# Patient Record
Sex: Female | Born: 1935 | Race: White | Hispanic: No | Smoking: Never smoker
Health system: Southern US, Community
[De-identification: ages and names within clinical notes are randomized; demographics above are authoritative.]

## PROBLEM LIST (undated history)

## (undated) ENCOUNTER — Emergency Department (HOSPITAL_COMMUNITY): Payer: Medicare PPO

## (undated) DIAGNOSIS — I10 Essential (primary) hypertension: Secondary | ICD-10-CM

---

## 2011-08-27 DIAGNOSIS — H401190 Primary open-angle glaucoma, unspecified eye, stage unspecified: Secondary | ICD-10-CM | POA: Insufficient documentation

## 2013-12-17 DIAGNOSIS — E785 Hyperlipidemia, unspecified: Secondary | ICD-10-CM | POA: Insufficient documentation

## 2013-12-17 DIAGNOSIS — I119 Hypertensive heart disease without heart failure: Secondary | ICD-10-CM | POA: Insufficient documentation

## 2020-01-01 ENCOUNTER — Encounter (HOSPITAL_BASED_OUTPATIENT_CLINIC_OR_DEPARTMENT_OTHER): Payer: Self-pay

## 2020-01-01 ENCOUNTER — Other Ambulatory Visit: Payer: Self-pay

## 2020-01-01 ENCOUNTER — Encounter: Payer: Self-pay | Admitting: Family Medicine

## 2020-01-01 ENCOUNTER — Ambulatory Visit (HOSPITAL_BASED_OUTPATIENT_CLINIC_OR_DEPARTMENT_OTHER)
Admission: RE | Admit: 2020-01-01 | Discharge: 2020-01-01 | Disposition: A | Discharge status: Home / Self Care | Payer: Medicare PPO | Source: Ambulatory Visit | Attending: Family Medicine | Admitting: Family Medicine

## 2020-01-01 ENCOUNTER — Ambulatory Visit: Payer: Medicare PPO | Admitting: Family Medicine

## 2020-01-01 VITALS — BP 130/78 | HR 76 | Temp 98.6°F | Resp 16 | Ht 66.0 in | Wt 134.4 lb

## 2020-01-01 DIAGNOSIS — R531 Weakness: Secondary | ICD-10-CM | POA: Diagnosis not present

## 2020-01-01 DIAGNOSIS — R10817 Generalized abdominal tenderness: Secondary | ICD-10-CM

## 2020-01-01 DIAGNOSIS — R5383 Other fatigue: Secondary | ICD-10-CM

## 2020-01-01 DIAGNOSIS — N3000 Acute cystitis without hematuria: Secondary | ICD-10-CM

## 2020-01-01 DIAGNOSIS — R109 Unspecified abdominal pain: Secondary | ICD-10-CM

## 2020-01-01 DIAGNOSIS — R634 Abnormal weight loss: Secondary | ICD-10-CM | POA: Insufficient documentation

## 2020-01-01 DIAGNOSIS — R202 Paresthesia of skin: Secondary | ICD-10-CM

## 2020-01-01 DIAGNOSIS — R630 Anorexia: Secondary | ICD-10-CM

## 2020-01-01 DIAGNOSIS — R6881 Early satiety: Secondary | ICD-10-CM | POA: Diagnosis present

## 2020-01-01 DIAGNOSIS — H401120 Primary open-angle glaucoma, left eye, stage unspecified: Secondary | ICD-10-CM

## 2020-01-01 DIAGNOSIS — R300 Dysuria: Secondary | ICD-10-CM

## 2020-01-01 DIAGNOSIS — M810 Age-related osteoporosis without current pathological fracture: Secondary | ICD-10-CM

## 2020-01-01 DIAGNOSIS — G4700 Insomnia, unspecified: Secondary | ICD-10-CM

## 2020-01-01 LAB — POCT URINALYSIS DIP (PROADVANTAGE DEVICE)
Bilirubin, UA: NEGATIVE
Glucose, UA: NEGATIVE mg/dL
Ketones, POC UA: NEGATIVE mg/dL
Nitrite, UA: POSITIVE — AB
Protein Ur, POC: NEGATIVE mg/dL
Specific Gravity, Urine: 1.025
Urobilinogen, Ur: NEGATIVE
pH, UA: 5.5 (ref 5.0–8.0)

## 2020-01-01 LAB — BASIC METABOLIC PANEL
BUN/Creatinine Ratio: 30 — ABNORMAL HIGH (ref 12–28)
BUN: 21 mg/dL (ref 8–27)
CO2: 25 mmol/L (ref 20–29)
Calcium: 9.8 mg/dL (ref 8.7–10.3)
Chloride: 104 mmol/L (ref 96–106)
Creatinine, Ser: 0.71 mg/dL (ref 0.57–1.00)
GFR calc Af Amer: 92 mL/min/{1.73_m2} (ref >59)
GFR calc non Af Amer: 80 mL/min/{1.73_m2} (ref >59)
Glucose: 104 mg/dL — ABNORMAL HIGH (ref 65–99)
Potassium: 4.7 mmol/L (ref 3.5–5.2)
Sodium: 138 mmol/L (ref 134–144)

## 2020-01-01 IMAGING — CT CT ABD-PELV W/ CM
2 of 5 series · 14 of 46 positions shown, 16 images · IV contrast (Omnipaque)
Comparison: None.
COMPARISON: None.

Addendum:
CLINICAL DATA: Abdominal pain, unintended weight loss.

EXAM:
CT ABDOMEN AND PELVIS WITH CONTRAST
TECHNIQUE: Multidetector CT imaging of the abdomen and pelvis was performed
using the standard protocol following bolus administration of
intravenous contrast.
CONTRAST:  100mL OMNIPAQUE IOHEXOL 300 MG/ML  SOLN

[Series 2: axial st · axial · 0.84mm/px · z∈[-429,-44]mm · 11 of 87 slices shown, 13 images]
[im 5/87  soft-tissue]
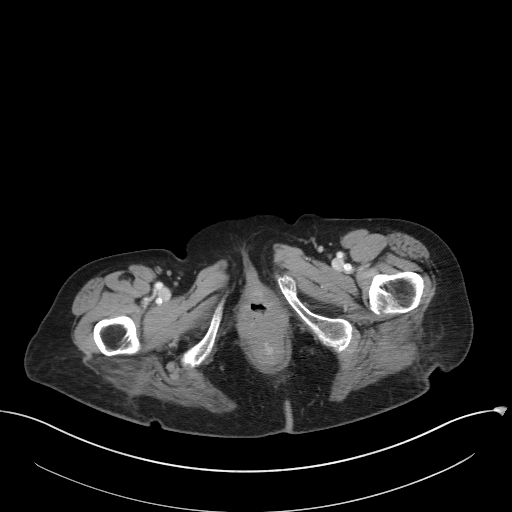
[im 5/87  bone]
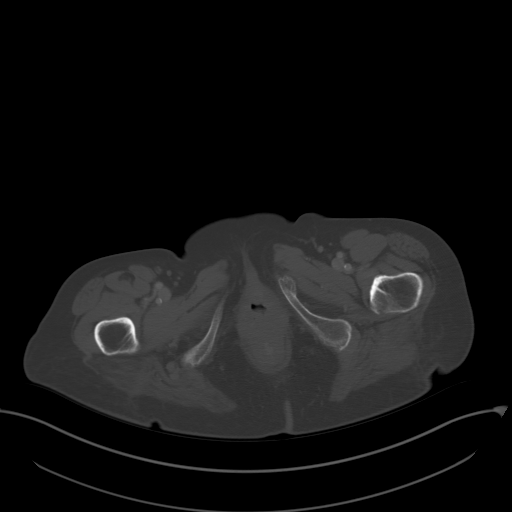
[im 15/87  soft-tissue]
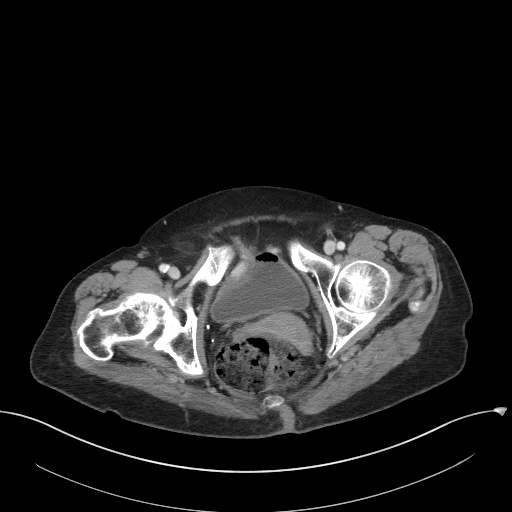
[im 20/87  soft-tissue]
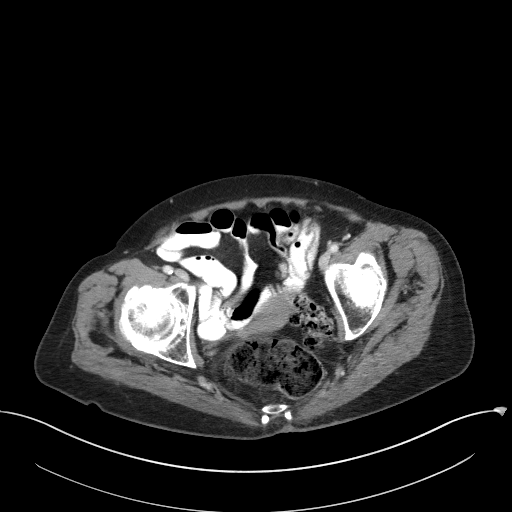
[im 29/87  soft-tissue]
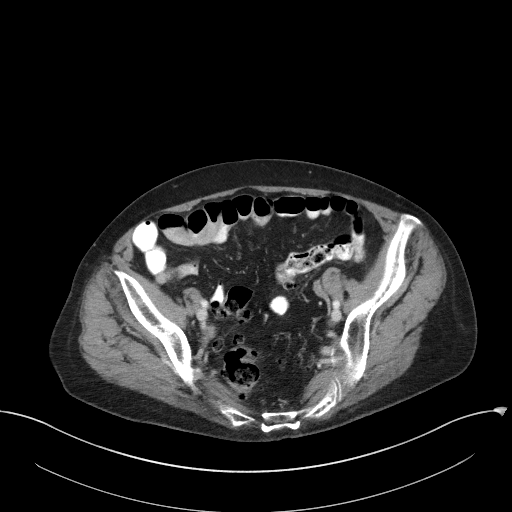
[im 34/87  soft-tissue]
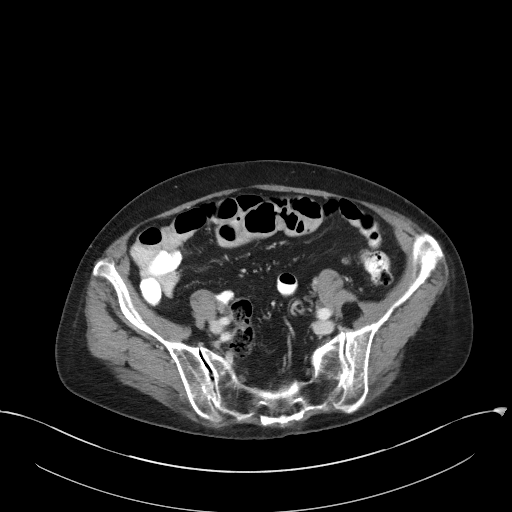
[im 44/87  soft-tissue]
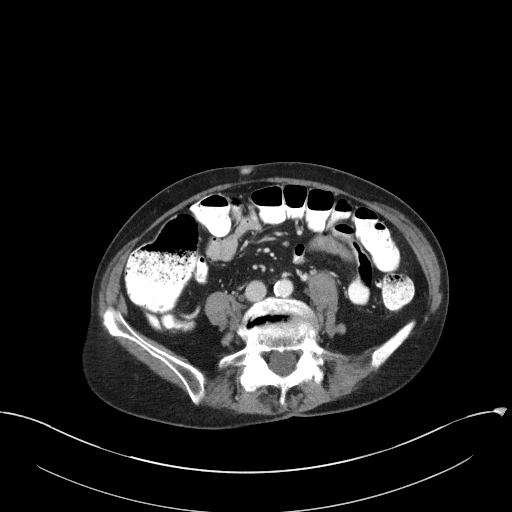
[im 53/87  soft-tissue]
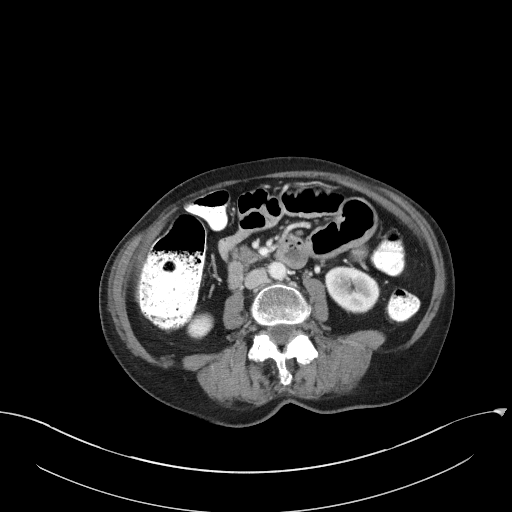
[im 58/87  soft-tissue]
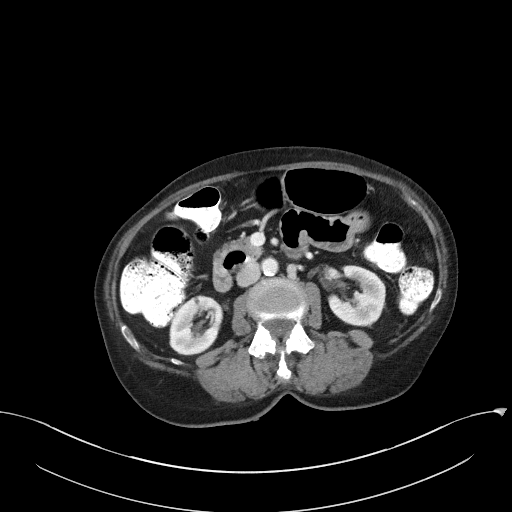
[im 67/87  soft-tissue]
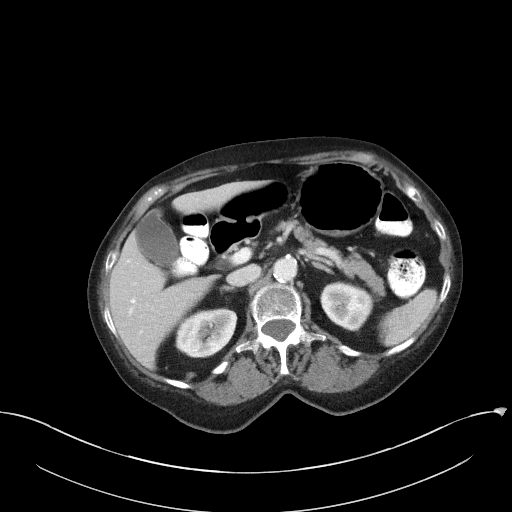
[im 67/87  bone]
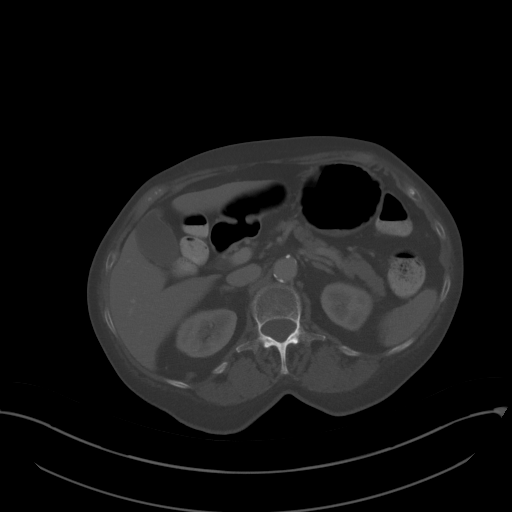
[im 72/87  soft-tissue]
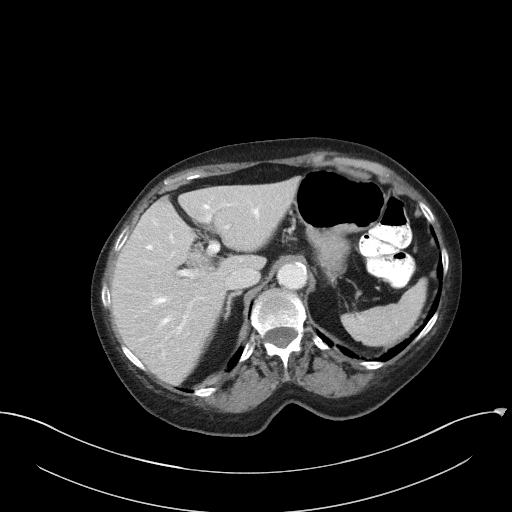
[im 82/87  soft-tissue]
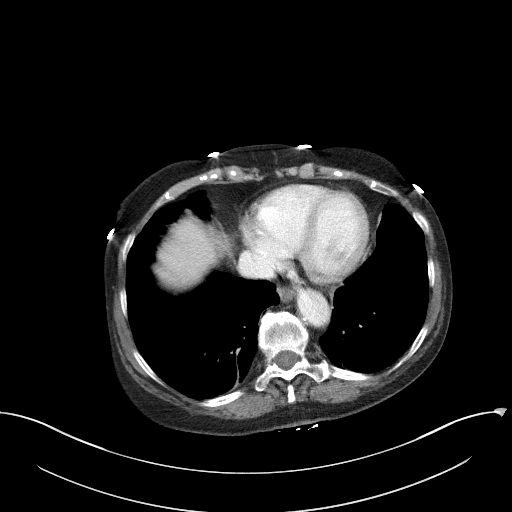

[Series 5: coronal st · coronal · 0.75mm/px · 3 of 76 slices shown]
[im 26/76  soft-tissue]
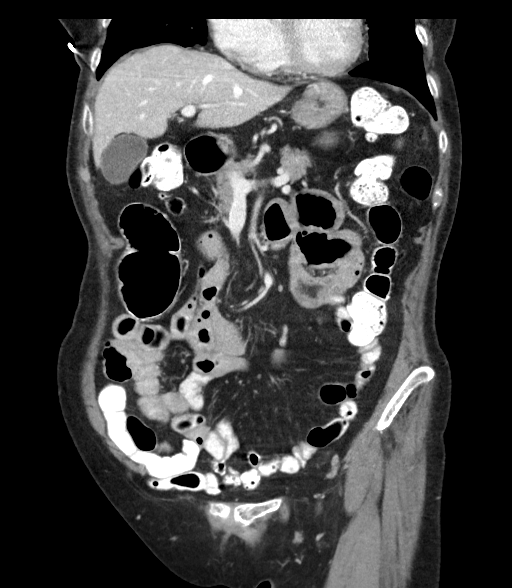
[im 34/76  soft-tissue]
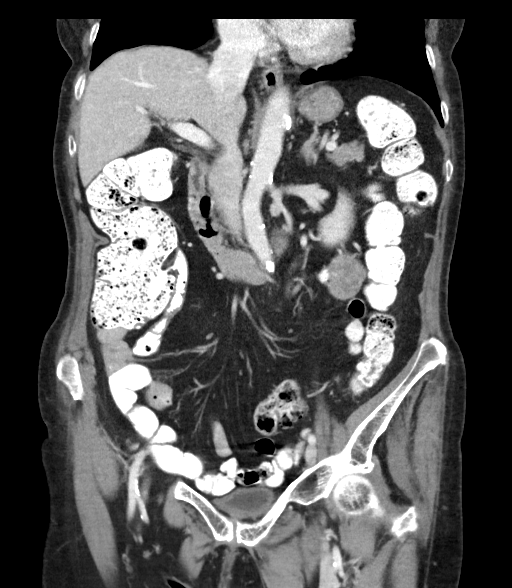
[im 42/76  soft-tissue]
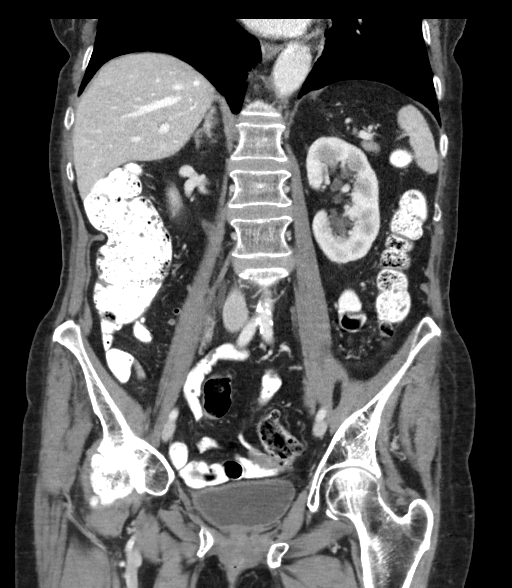

[14 of 46 positions shown; findings below may reference images not displayed]

FINDINGS: Lower chest: Mild bibasilar scarring/atelectasis.

Hepatobiliary: No focal liver abnormality is seen. No gallstones,
gallbladder wall thickening, or biliary dilatation.

Pancreas: Unremarkable. No pancreatic ductal dilatation or
surrounding inflammatory changes.

Spleen: Normal in size without focal abnormality.

Adrenals/Urinary Tract: Adrenal glands are unremarkable. Kidneys are
normal, without renal calculi, focal lesion, or hydronephrosis.
Bladder is unremarkable. Air within the bladder is presumably
related to recent catheterization.

Stomach/Bowel: No dilated large or small bowel loops. Diverticulosis
of the descending and sigmoid colon, most pronounced within the
sigmoid colon, but no focal inflammatory change is seen to suggest
acute diverticulitis. No evidence of bowel wall inflammation.
Stomach is unremarkable. Mild thickening of the walls of the small
bowel in the LEFT upper quadrant, of uncertain significance or
chronicity. Appendix is normal. Fairly large amount of stool in the
rectal vault.

Vascular/Lymphatic: Aortic atherosclerosis. No acute appearing
vascular abnormality. No enlarged lymph nodes are seen in the
abdomen or pelvis.

Reproductive: Uterus and bilateral adnexa are unremarkable.

Other: No free fluid or abscess collection.  No soft tissue mass.

Musculoskeletal: No acute or suspicious osseous abnormality.
Degenerative spondylosis of the lower lumbar spine, mild to moderate
in degree.
IMPRESSION: 1. Mild thickening of the walls of the small bowel in the LEFT upper
quadrant, of uncertain significance or chronicity. This could
represent a mild enteritis of infectious or inflammatory nature.
2. Colonic diverticulosis without evidence of acute diverticulitis.
3. Fairly large amount of stool in the rectal vault (constipation?).
4. Stomach appears normal.

Aortic Atherosclerosis ([VY]-[VY]).

ADDENDUM:
Ordering physician tells me that there was no recent bladder
catheterization prior to this CT exam to explain the air within the
bladder.

After further review, there is no evidence of a bowel fistula (no
continuity of the bladder walls and overlying bowel loops) or other
potential source for the air that is seen within the bladder. Close
clinical follow-up recommended to exclude early developing
gas-forming cystitis.

These findings and considerations were discussed with the ordering
physician, nurse practitioner PARTAIN, on [DATE] at [DATE] p.m.

*** End of Addendum ***
FINDINGS: Lower chest: Mild bibasilar scarring/atelectasis.

Hepatobiliary: No focal liver abnormality is seen. No gallstones,
gallbladder wall thickening, or biliary dilatation.

Pancreas: Unremarkable. No pancreatic ductal dilatation or
surrounding inflammatory changes.

Spleen: Normal in size without focal abnormality.

Adrenals/Urinary Tract: Adrenal glands are unremarkable. Kidneys are
normal, without renal calculi, focal lesion, or hydronephrosis.
Bladder is unremarkable. Air within the bladder is presumably
related to recent catheterization.

Stomach/Bowel: No dilated large or small bowel loops. Diverticulosis
of the descending and sigmoid colon, most pronounced within the
sigmoid colon, but no focal inflammatory change is seen to suggest
acute diverticulitis. No evidence of bowel wall inflammation.
Stomach is unremarkable. Mild thickening of the walls of the small
bowel in the LEFT upper quadrant, of uncertain significance or
chronicity. Appendix is normal. Fairly large amount of stool in the
rectal vault.

Vascular/Lymphatic: Aortic atherosclerosis. No acute appearing
vascular abnormality. No enlarged lymph nodes are seen in the
abdomen or pelvis.

Reproductive: Uterus and bilateral adnexa are unremarkable.

Other: No free fluid or abscess collection.  No soft tissue mass.

Musculoskeletal: No acute or suspicious osseous abnormality.
Degenerative spondylosis of the lower lumbar spine, mild to moderate
in degree.
IMPRESSION: 1. Mild thickening of the walls of the small bowel in the LEFT upper
quadrant, of uncertain significance or chronicity. This could
represent a mild enteritis of infectious or inflammatory nature.
2. Colonic diverticulosis without evidence of acute diverticulitis.
3. Fairly large amount of stool in the rectal vault (constipation?).
4. Stomach appears normal.

Aortic Atherosclerosis ([VY]-[VY]).

## 2020-01-01 MED ORDER — IOHEXOL 300 MG/ML  SOLN
100.0000 mL | Freq: Once | INTRAMUSCULAR | Status: AC | PRN
  Administered 2020-01-01: 16:00:00 100 mL via INTRAVENOUS

## 2020-01-01 MED ORDER — SULFAMETHOXAZOLE-TRIMETHOPRIM 800-160 MG PO TABS: 1.0000 | ORAL_TABLET | Freq: Two times a day (BID) | ORAL | 0 refills | Status: DC

## 2020-01-01 NOTE — Patient Instructions (Signed)
Increase your fluids. Your urine needs a be a very light yellow and not dark like tea.   I sent in an antibiotic prescription to the pharmacy below.   [facility] [address] [place]   I will forward all of today's results to your PCP in [place].

## 2020-01-01 NOTE — Progress Notes (Signed)
Please assist me in forwarding these results (along with other results) to her PCP at [facility], Dr. Dalphine Handing at [facility].

## 2020-01-01 NOTE — Progress Notes (Signed)
Subjective:    Patient ID: Sheila Andrade, female    DOB: 30-Nov-1935, 84 y.o.   MRN: [id number]  HPI Chief Complaint  Patient presents with  . Abdominal Pain    And feeling full when she eats two bites. Also insomnia.    She is a 84 year old Caucasian female who is new to the practice and here for an acute complaint. She lives in [place] and has a primary care at [facility] internal medicine but is in town visiting with her daughter over the holidays.  Complains of a 2-week history of severe fatigue, generalized weakness, abdominal bloating, early satiety, increased eructation as well as a 5 pound weight loss over the past week or 2.  Reports having a poor appetite. In the past she has been quite energetic  Negative Covid test yesterday.   She also reports intermittent acid reflux.  Slight worsening constipation  Denies fever, chills, headaches, dizziness, chest pain, palpitations, shortness of breath, vomiting or diarrhea.  Denies any blood in her stool.  She also has a several week history of waking up early and inability to sleep through the night as she has in the past. States she is waking up at 4 AM and cannot fall back to sleep.  She has tried Tylenol PM and melatonin 3 mg.  She is also noticed "burning" of her skin on the top of her hands.  This occurs in the mornings.  No history of smoking, alcohol or drug use.  Her last colonoscopy was approximately 8-10 years ago.  She has noticed pain with urination and urinary frequency over the past 2 to 3 days.  Reviewed allergies, medications, past medical, surgical, family, and social history.    Review of Systems Pertinent positives and negatives in the history of present illness.     Objective:   Physical Exam Constitutional:      General: She is not in acute distress.    Appearance: She is well-developed. She is not ill-appearing.  HENT:     Mouth/Throat:     Mouth: Mucous membranes are moist.  Eyes:      Extraocular Movements: Extraocular movements intact.     Pupils: Pupils are equal, round, and reactive to light.  Neck:     Thyroid: No thyromegaly or thyroid tenderness.  Cardiovascular:     Rate and Rhythm: Normal rate and regular rhythm.     Pulses: Normal pulses.     Heart sounds: Murmur heard.  No friction rub. No gallop.      Comments: Mild murmur  Pulmonary:     Effort: Pulmonary effort is normal.     Breath sounds: Normal breath sounds.  Abdominal:     General: Abdomen is flat. Bowel sounds are increased. There is no distension or abdominal bruit.     Palpations: Abdomen is soft.     Tenderness: There is generalized abdominal tenderness. There is no right CVA tenderness, left CVA tenderness, guarding or rebound. Negative signs include Murphy's sign, Rovsing's sign and McBurney's sign.  Musculoskeletal:     Cervical back: Full passive range of motion without pain and neck supple.     Right lower leg: No edema.     Left lower leg: No edema.  Lymphadenopathy:     Cervical: No cervical adenopathy.     Right cervical: No superficial cervical adenopathy.    Left cervical: No superficial cervical adenopathy.  Skin:    General: Skin is warm and dry.     Capillary  Refill: Capillary refill takes less than 2 seconds.     Coloration: Skin is not pale.  Neurological:     General: No focal deficit present.     Mental Status: She is alert and oriented to person, place, and time.     Cranial Nerves: Cranial nerves are intact.     Sensory: Sensation is intact.     Motor: Motor function is intact.  Psychiatric:        Attention and Perception: Attention normal.        Mood and Affect: Mood and affect normal.        Speech: Speech normal.        Behavior: Behavior normal.        Cognition and Memory: Cognition and memory normal.    BP 130/78   Pulse 76   Temp 98.6 F (37 C)   Resp 16   Ht 5\' 6"  (1.676 m)   Wt 134 lb 6.4 oz (61 kg)   BMI 21.69 kg/m       Assessment & Plan:   Abdominal pain, unspecified abdominal location - Plan: POCT Urinalysis DIP (Proadvantage Device), CBC with Differential/Platelet, Comprehensive metabolic panel, Lipase, CT Abdomen Pelvis W Contrast, Basic metabolic panel -fairly benign abdominal exam although diffuse TTP   Early satiety - Plan: CBC with Differential/Platelet, Comprehensive metabolic panel, Lipase, CT Abdomen Pelvis W Contrast -check labs, CT abdomen  Generalized weakness - Plan: CBC with Differential/Platelet, Comprehensive metabolic panel, Vitamin B12, VITAMIN D 25 Hydroxy (Vit-D Deficiency, Fractures), Sedimentation rate, Iron, TIBC and Ferritin Panel -check labs   Fatigue, unspecified type - Plan: TSH, T4, free, T3, Vitamin B12, VITAMIN D 25 Hydroxy (Vit-D Deficiency, Fractures), Iron, TIBC and Ferritin Panel -check labs. May be related to insomnia   Recent unexplained weight loss - Plan: CBC with Differential/Platelet, Comprehensive metabolic panel, TSH, T4, free, T3, Sedimentation rate, Iron, TIBC and Ferritin Panel, Lipase, CT Abdomen Pelvis W Contrast -check labs, CT abdomen. Try to increase calories and hydrate  Poor appetite - Plan: CBC with Differential/Platelet, Comprehensive metabolic panel, CT Abdomen Pelvis W Contrast  Paresthesias - Plan: TSH, T4, free, T3, Sedimentation rate -occurs only in the mornings. Consider CTS  Dysuria -UA +trace blood, leuks, nit  Acute cystitis without hematuria - Plan: Urine Culture, sulfamethoxazole-trimethoprim (BACTRIM DS) 800-160 MG tablet -UA +trace blood, leuks, nit Bactrim prescribed. Encouraged her to increase fluids. Urine sent for culture. Will need follow up with PCP in [facility]   Insomnia, unspecified type - Plan: TSH, T4, free, T3 -may increase melatonin to 6 mg. Recommend starting this while staying with her daughter who is a physician this week. Counseling on good sleep hygiene.   Age-related osteoporosis without current pathological fracture -took  alendronate for years. Currently taking vitamin D and walking for exercise although her walking has been reduced due to hip pain. DEXA ordered by her PCP but patient states she does not know where to go for this. She will contact her PCP office to get more instructions.   Primary open angle glaucoma of left eye, unspecified glaucoma stage -followed by eye doctor at [facility]  Generalized abdominal tenderness without rebound tenderness - Plan: CT Abdomen Pelvis W Contrast -check labs, CT abdomen.   Spent at least 45 minutes with this pleasant 84 year old new patient and more than 1 hour total in coordination of care. I will forward results from today to her PCP since she lives in [place] and is only here visiting family.

## 2020-01-02 LAB — COMPREHENSIVE METABOLIC PANEL
ALT: 14 IU/L (ref 0–32)
AST: 17 IU/L (ref 0–40)
Albumin/Globulin Ratio: 1.6 (ref 1.2–2.2)
Albumin: 4.1 g/dL (ref 3.6–4.6)
Alkaline Phosphatase: 67 IU/L (ref 44–121)
BUN/Creatinine Ratio: 24 (ref 12–28)
BUN: 20 mg/dL (ref 8–27)
Bilirubin Total: 0.3 mg/dL (ref 0.0–1.2)
CO2: 22 mmol/L (ref 20–29)
Calcium: 9.8 mg/dL (ref 8.7–10.3)
Chloride: 104 mmol/L (ref 96–106)
Creatinine, Ser: 0.83 mg/dL (ref 0.57–1.00)
GFR calc Af Amer: 76 mL/min/{1.73_m2} (ref >59)
GFR calc non Af Amer: 66 mL/min/{1.73_m2} (ref >59)
Globulin, Total: 2.5 g/dL (ref 1.5–4.5)
Glucose: 103 mg/dL — ABNORMAL HIGH (ref 65–99)
Potassium: 4.5 mmol/L (ref 3.5–5.2)
Sodium: 137 mmol/L (ref 134–144)
Total Protein: 6.6 g/dL (ref 6.0–8.5)

## 2020-01-02 LAB — IRON,TIBC AND FERRITIN PANEL
Ferritin: 294 ng/mL — ABNORMAL HIGH (ref 15–150)
Iron Saturation: 26 % (ref 15–55)
Iron: 72 ug/dL (ref 27–139)
Total Iron Binding Capacity: 278 ug/dL (ref 250–450)
UIBC: 206 ug/dL (ref 118–369)

## 2020-01-02 LAB — CBC WITH DIFFERENTIAL/PLATELET
Basophils Absolute: 0 10*3/uL (ref 0.0–0.2)
Basos: 1 %
EOS (ABSOLUTE): 0 10*3/uL (ref 0.0–0.4)
Eos: 0 %
Hematocrit: 38.1 % (ref 34.0–46.6)
Hemoglobin: 12.3 g/dL (ref 11.1–15.9)
Immature Grans (Abs): 0 10*3/uL (ref 0.0–0.1)
Immature Granulocytes: 0 %
Lymphocytes Absolute: 1.5 10*3/uL (ref 0.7–3.1)
Lymphs: 24 %
MCH: 29.6 pg (ref 26.6–33.0)
MCHC: 32.3 g/dL (ref 31.5–35.7)
MCV: 92 fL (ref 79–97)
Monocytes Absolute: 0.4 10*3/uL (ref 0.1–0.9)
Monocytes: 7 %
Neutrophils Absolute: 4.2 10*3/uL (ref 1.4–7.0)
Neutrophils: 68 %
Platelets: 290 10*3/uL (ref 150–450)
RBC: 4.15 x10E6/uL (ref 3.77–5.28)
RDW: 12.9 % (ref 11.7–15.4)
WBC: 6.2 10*3/uL (ref 3.4–10.8)

## 2020-01-02 LAB — T4, FREE: Free T4: 1.67 ng/dL (ref 0.82–1.77)

## 2020-01-02 LAB — T3: T3, Total: 100 ng/dL (ref 71–180)

## 2020-01-02 LAB — LIPASE: Lipase: 24 U/L (ref 14–85)

## 2020-01-02 LAB — VITAMIN D 25 HYDROXY (VIT D DEFICIENCY, FRACTURES): Vit D, 25-Hydroxy: 34.5 ng/mL (ref 30.0–100.0)

## 2020-01-02 LAB — SEDIMENTATION RATE: Sed Rate: 6 mm/hr (ref 0–40)

## 2020-01-02 LAB — VITAMIN B12: Vitamin B-12: 340 pg/mL (ref 232–1245)

## 2020-01-02 LAB — TSH: TSH: 1.32 u[IU]/mL (ref 0.450–4.500)

## 2020-01-03 ENCOUNTER — Other Ambulatory Visit (HOSPITAL_COMMUNITY): Payer: Self-pay | Admitting: *Deleted

## 2020-01-03 ENCOUNTER — Other Ambulatory Visit: Payer: Self-pay | Admitting: *Deleted

## 2020-01-03 ENCOUNTER — Other Ambulatory Visit: Payer: Self-pay | Admitting: Family Medicine

## 2020-01-03 DIAGNOSIS — R140 Abdominal distension (gaseous): Secondary | ICD-10-CM

## 2020-01-03 DIAGNOSIS — R6881 Early satiety: Secondary | ICD-10-CM

## 2020-01-03 DIAGNOSIS — R1319 Other dysphagia: Secondary | ICD-10-CM

## 2020-01-03 DIAGNOSIS — R1310 Dysphagia, unspecified: Secondary | ICD-10-CM

## 2020-01-03 DIAGNOSIS — R933 Abnormal findings on diagnostic imaging of other parts of digestive tract: Secondary | ICD-10-CM

## 2020-01-03 NOTE — Progress Notes (Signed)
Discussed with patient and her daughter. Please forward to Dr. Sherryll Burger, her PCP.

## 2020-01-03 NOTE — Progress Notes (Signed)
Would you send all of her results (CT, labs, urine ) and my office notes to her PCP when you have time please. She has a visit scheduled for Monday January 3rd at 8am. Dr. Wallace Going at [facility].

## 2020-01-06 LAB — URINE CULTURE

## 2020-01-08 ENCOUNTER — Ambulatory Visit (HOSPITAL_COMMUNITY)
Admission: RE | Admit: 2020-01-08 | Discharge: 2020-01-08 | Disposition: A | Discharge status: Home / Self Care | Payer: Medicare PPO | Source: Ambulatory Visit | Attending: Family Medicine | Admitting: Family Medicine

## 2020-01-08 ENCOUNTER — Other Ambulatory Visit: Payer: Self-pay

## 2020-01-08 ENCOUNTER — Other Ambulatory Visit: Payer: Self-pay | Admitting: Family Medicine

## 2020-01-08 DIAGNOSIS — R6881 Early satiety: Secondary | ICD-10-CM

## 2020-01-08 DIAGNOSIS — R140 Abdominal distension (gaseous): Secondary | ICD-10-CM

## 2020-01-08 DIAGNOSIS — R933 Abnormal findings on diagnostic imaging of other parts of digestive tract: Secondary | ICD-10-CM

## 2020-01-08 DIAGNOSIS — R1319 Other dysphagia: Secondary | ICD-10-CM

## 2020-01-08 IMAGING — RF DG UGI W SINGLE CM
11 of 20 series · 12 of 24 positions shown · non-contrast
Comparison: [DATE].

CLINICAL DATA: Small bowel abnormality seen on CT scan.

EXAM:
UPPER GI SERIES WITH KUB
TECHNIQUE: After obtaining a scout radiograph a routine upper GI series was
performed using thin density barium.
FLUOROSCOPY TIME:  Radiation Exposure Index (if provided by the
fluoroscopic device): 47.3 mGy.

[Series 2: cp_standard · 0.53mm/px · 2 of 88 frames shown (1 of 2)]
[frame 1/88]
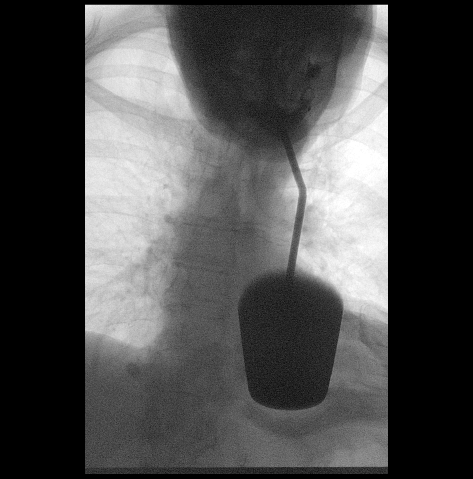
[frame 75/88]
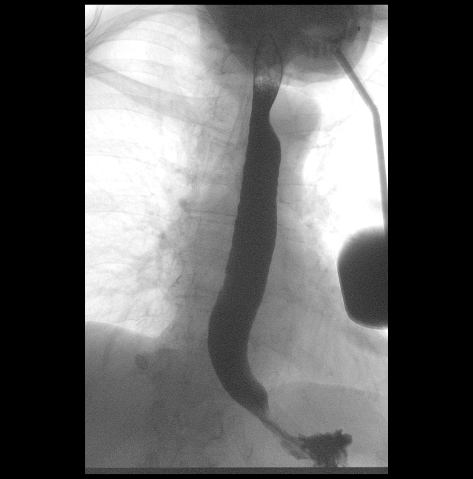

[Series 3: cp_standard · 0.53mm/px · 1 of 79 frames shown (2 of 2)]
[frame 40/79]
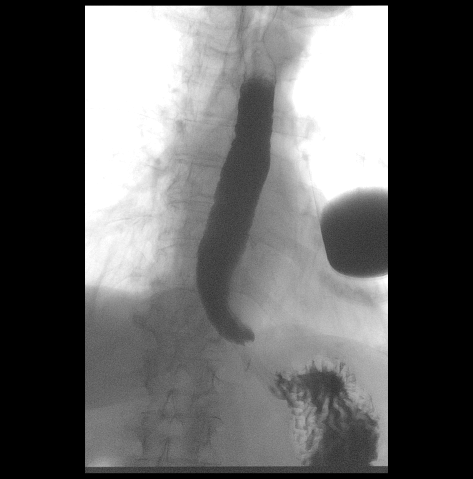

[Series 4: fluoro_barium singleshot_bw · 0.18mm/px · 1 of 1 slices shown (1 of 9)]
[im 1/1]
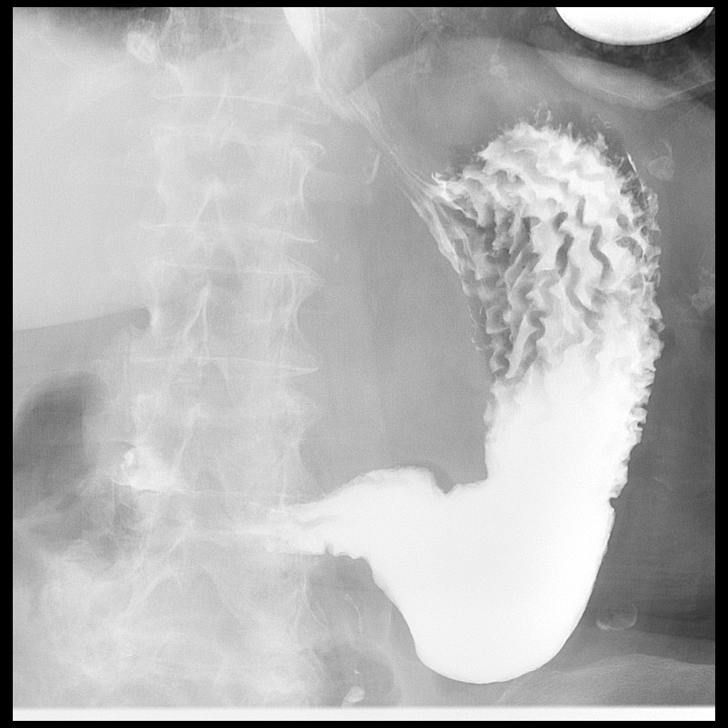

[Series 6: fluoro_barium singleshot_bw · 0.17mm/px · 1 of 1 slices shown (2 of 9)]
[im 1/1]
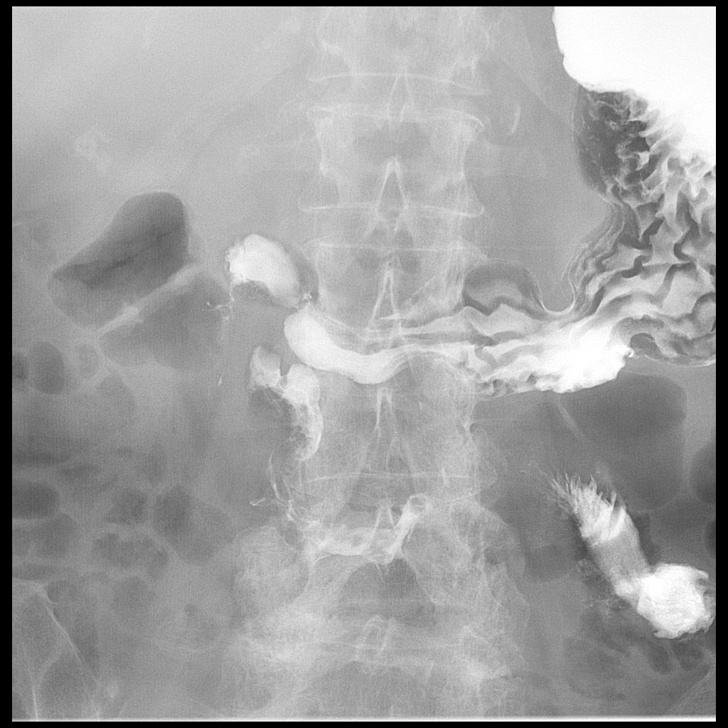

[Series 9: fluoro_barium singleshot_bw · 0.18mm/px · 1 of 1 slices shown (3 of 9)]
[im 1/1]
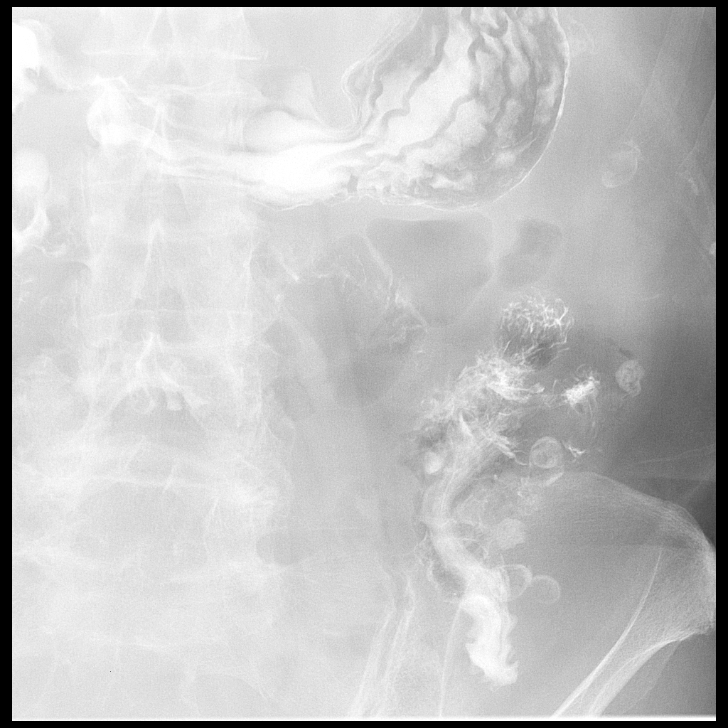

[Series 11: fluoro_barium singleshot_bw · 0.19mm/px · 1 of 1 slices shown (4 of 9)]
[im 1/1]
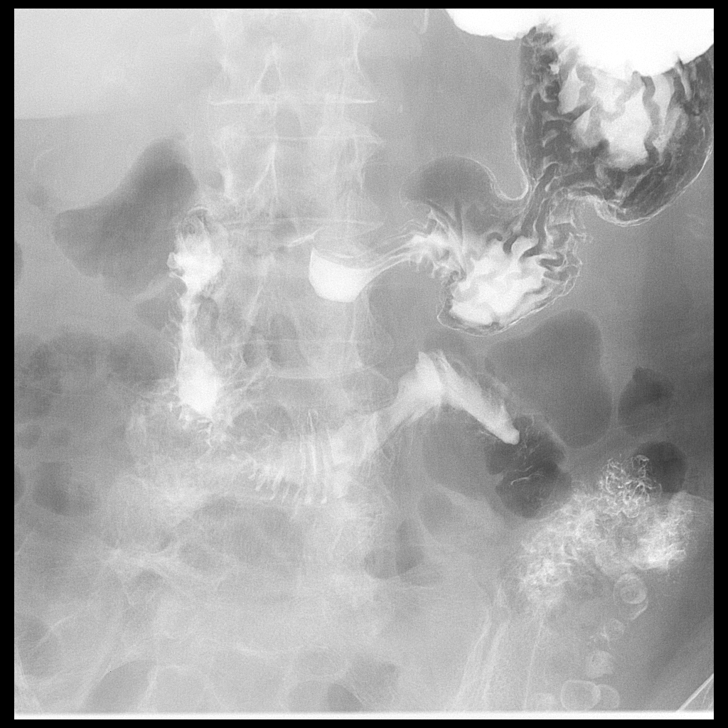

[Series 14: fluoro_barium singleshot_bw · 0.18mm/px · 1 of 1 slices shown (5 of 9)]
[im 1/1]
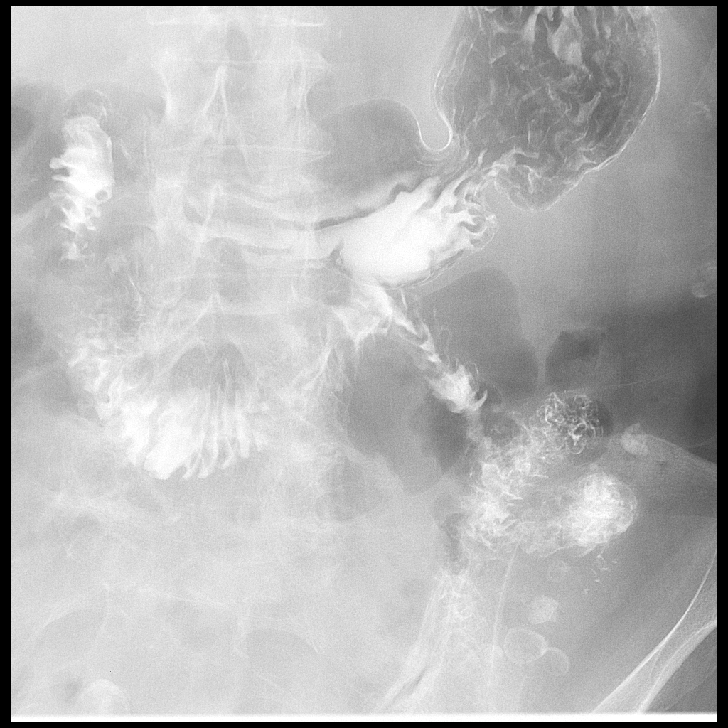

[Series 16: fluoro_barium singleshot_bw · 0.18mm/px · 1 of 1 slices shown (6 of 9)]
[im 1/1]
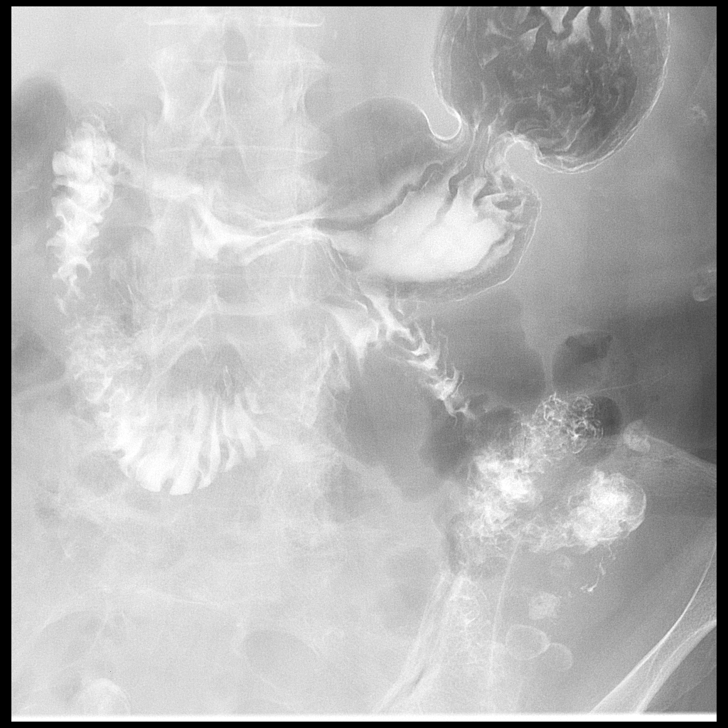

[Series 19: fluoro_barium singleshot_bw · 0.17mm/px · 1 of 1 slices shown (7 of 9)]
[im 1/1]
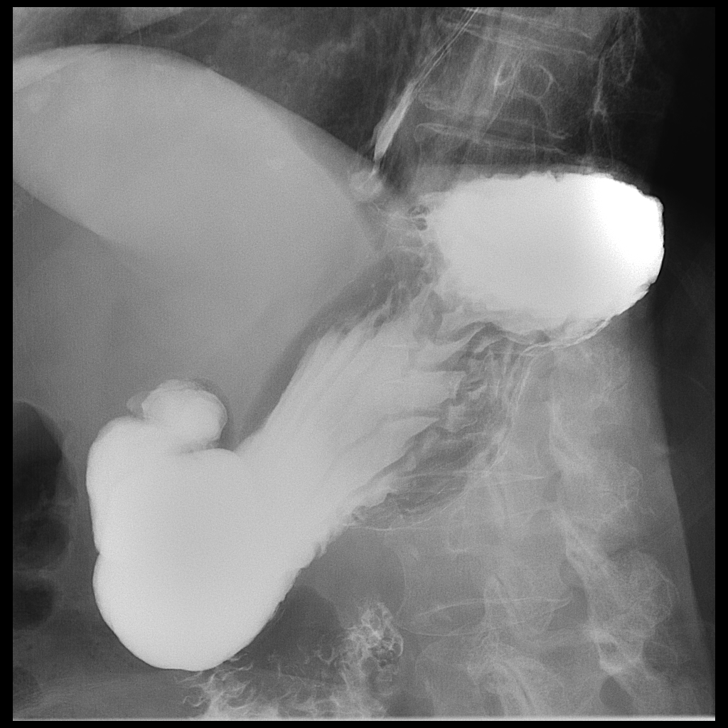

[Series 21: fluoro_barium singleshot_bw · 0.17mm/px · 1 of 1 slices shown (8 of 9)]
[im 1/1]
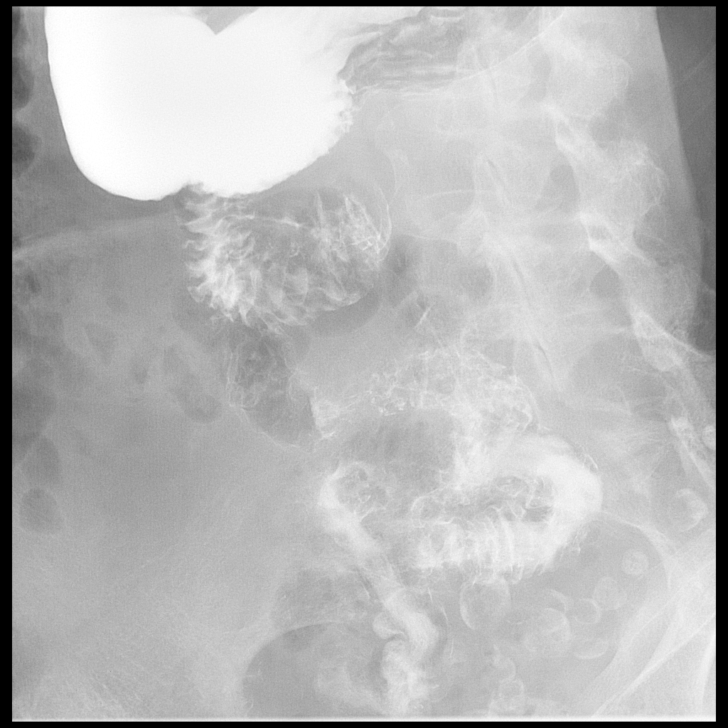

[Series 24: fluoro_barium singleshot_bw · 0.17mm/px · 1 of 1 slices shown (9 of 9)]
[im 1/1]
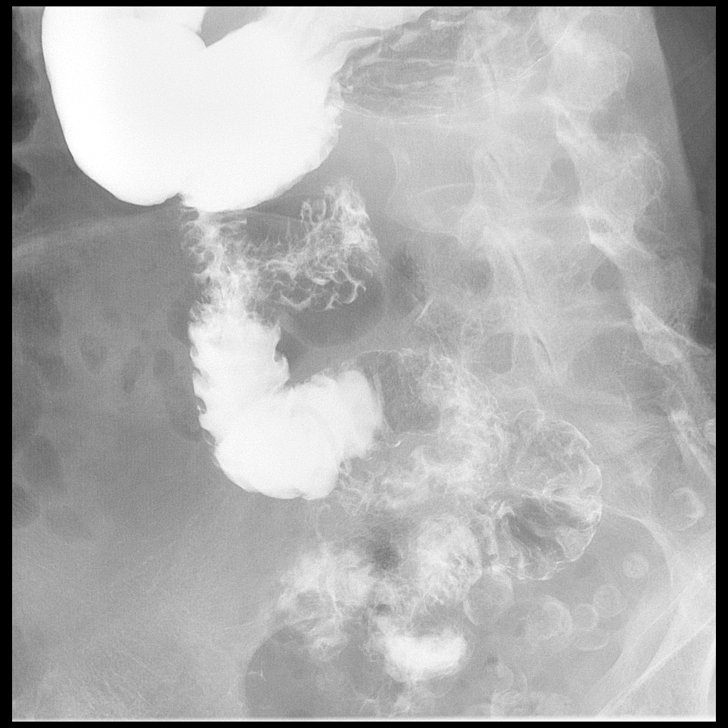

[12 of 24 positions shown; findings below may reference images not displayed]

FINDINGS: No definite mass or stricture is noted in the esophagus. Mild
tertiary contractions are noted suggesting presbyesophagus. No
hiatal hernia or reflux is noted. The stomach is unremarkable.
Contrast flowed easily from the stomach into the duodenum.
Diverticulum is arising from the second portion of the duodenum. The
duodenum is otherwise unremarkable. Visualized proximal jejunum is
unremarkable, with no evidence of abnormal dilatation or fold
thickening. This includes the bowel loops that were of concern on
recent CT scan.
IMPRESSION: Mild tertiary contractions of the distal esophagus are noted
suggesting presbyesophagus. Diverticulum arising from second portion
of duodenum. No other abnormality seen involving the esophagus,
stomach, duodenum or visualized portion of proximal jejunum.

## 2020-01-08 NOTE — Progress Notes (Signed)
Results called to patient and her daughter. Will defer further work up to her PCP in [place].

## 2020-01-09 ENCOUNTER — Encounter (HOSPITAL_COMMUNITY): Payer: Medicare PPO

## 2020-01-09 ENCOUNTER — Ambulatory Visit (HOSPITAL_COMMUNITY): Payer: Medicare PPO

## 2020-01-16 DIAGNOSIS — H903 Sensorineural hearing loss, bilateral: Secondary | ICD-10-CM | POA: Insufficient documentation

## 2020-02-05 DIAGNOSIS — I10 Essential (primary) hypertension: Secondary | ICD-10-CM | POA: Insufficient documentation

## 2020-04-17 ENCOUNTER — Telehealth: Payer: Self-pay | Admitting: Internal Medicine

## 2020-04-17 DIAGNOSIS — R232 Flushing: Secondary | ICD-10-CM

## 2020-04-17 DIAGNOSIS — D509 Iron deficiency anemia, unspecified: Secondary | ICD-10-CM

## 2020-04-17 DIAGNOSIS — R339 Retention of urine, unspecified: Secondary | ICD-10-CM

## 2020-04-17 NOTE — Telephone Encounter (Signed)
Per vickie, ok labs from [facility] health care since pt is town

## 2020-04-18 ENCOUNTER — Other Ambulatory Visit (INDEPENDENT_AMBULATORY_CARE_PROVIDER_SITE_OTHER): Payer: Medicare PPO

## 2020-04-18 DIAGNOSIS — R102 Pelvic and perineal pain: Secondary | ICD-10-CM

## 2020-04-18 DIAGNOSIS — R339 Retention of urine, unspecified: Secondary | ICD-10-CM

## 2020-04-18 DIAGNOSIS — R232 Flushing: Secondary | ICD-10-CM

## 2020-04-18 DIAGNOSIS — D509 Iron deficiency anemia, unspecified: Secondary | ICD-10-CM

## 2020-04-18 LAB — POCT URINALYSIS DIP (PROADVANTAGE DEVICE)
Bilirubin, UA: NEGATIVE
Glucose, UA: NEGATIVE mg/dL
Ketones, POC UA: NEGATIVE mg/dL
Nitrite, UA: POSITIVE — AB
Protein Ur, POC: NEGATIVE mg/dL
Specific Gravity, Urine: 1.025
Urobilinogen, Ur: NEGATIVE
pH, UA: 6 (ref 5.0–8.0)

## 2020-04-18 LAB — CBC WITH DIFFERENTIAL/PLATELET
Basophils Absolute: 0 10*3/uL (ref 0.0–0.2)
Basos: 1 %
EOS (ABSOLUTE): 0.1 10*3/uL (ref 0.0–0.4)
Eos: 1 %
Hematocrit: 33.5 % — ABNORMAL LOW (ref 34.0–46.6)
Hemoglobin: 10.8 g/dL — ABNORMAL LOW (ref 11.1–15.9)
Lymphocytes Absolute: 2 10*3/uL (ref 0.7–3.1)
Lymphs: 32 %
MCH: 29.1 pg (ref 26.6–33.0)
MCHC: 32.2 g/dL (ref 31.5–35.7)
MCV: 90 fL (ref 79–97)
Monocytes Absolute: 0.5 10*3/uL (ref 0.1–0.9)
Monocytes: 9 %
Neutrophils Absolute: 3.6 10*3/uL (ref 1.4–7.0)
Neutrophils: 57 %
Platelets: 366 10*3/uL (ref 150–450)
RBC: 3.71 x10E6/uL — ABNORMAL LOW (ref 3.77–5.28)
RDW: 14.1 % (ref 11.7–15.4)
WBC: 6.3 10*3/uL (ref 3.4–10.8)

## 2020-04-18 NOTE — Progress Notes (Unsigned)
Per vickie. Ran UA and needs to be treated for UTI. Will reach out to primary care provider

## 2020-04-18 NOTE — Addendum Note (Signed)
Addended by: Herminio Commons A on: 04/18/2020 08:03 AM   Modules accepted: Orders

## 2020-04-18 NOTE — Progress Notes (Signed)
I called and spoke with front office at Dr. Margaretmary Eddy office regarding Sheila Andrade's UTI. Dr. Sherryll Burger was not in today but they will relay the message to another provider in the practice, Dr. Lurlean Leyden to prescribe antibiotic for patient. I can do it if she has not heard from them by the end of day so she can let me know.  CBC, TSH and urine culture ordered per Dr. Sherryll Burger.

## 2020-04-19 LAB — TSH: TSH: 1.53 u[IU]/mL (ref 0.450–4.500)

## 2020-04-20 LAB — URINE CULTURE

## 2020-04-21 NOTE — Progress Notes (Signed)
Please forward her results to Dr. Wallace Going, her PCP in [facility].

## 2020-05-27 ENCOUNTER — Ambulatory Visit: Payer: Medicare PPO | Admitting: Medical

## 2020-05-27 ENCOUNTER — Other Ambulatory Visit: Payer: Self-pay

## 2020-05-27 VITALS — BP 92/60 | HR 79 | Resp 98 | Wt 116.4 lb

## 2020-05-27 DIAGNOSIS — B354 Tinea corporis: Secondary | ICD-10-CM

## 2020-05-27 DIAGNOSIS — R509 Fever, unspecified: Secondary | ICD-10-CM | POA: Diagnosis not present

## 2020-05-27 DIAGNOSIS — D649 Anemia, unspecified: Secondary | ICD-10-CM

## 2020-05-27 DIAGNOSIS — R634 Abnormal weight loss: Secondary | ICD-10-CM | POA: Diagnosis not present

## 2020-05-27 DIAGNOSIS — R8290 Unspecified abnormal findings in urine: Secondary | ICD-10-CM

## 2020-05-27 LAB — POCT URINALYSIS DIP (PROADVANTAGE DEVICE)
Bilirubin, UA: NEGATIVE
Blood, UA: NEGATIVE
Glucose, UA: NEGATIVE mg/dL
Ketones, POC UA: NEGATIVE mg/dL
Leukocytes, UA: NEGATIVE
Nitrite, UA: POSITIVE — AB
Specific Gravity, Urine: 1.02
Urobilinogen, Ur: 0.2
pH, UA: 6 (ref 5.0–8.0)

## 2020-05-27 MED ORDER — NYSTATIN 100000 UNIT/GM EX POWD: 1.0000 "application " | Freq: Three times a day (TID) | CUTANEOUS | 0 refills | Status: DC

## 2020-05-27 NOTE — Progress Notes (Signed)
Subjective:  Sheila Andrade is a 85 y.o. female who presents for Chief Complaint  Patient presents with  . Rash    Under left ar-fungal spot- unexplained fever-has a burning sensation from waist up to shoulders. Started 3 days ago.      Medical team: Has PCP in [facility], Dr. Clelia Croft, but recuperating here in [facility] for now Sees physical therapy and orthopedics GI specialist, having endoscopy soon due to anemia Denies other specialist   She is here alone today.  She notes some intermittent fever feeling, but temp shows normal.   Feels like her liver is being fried, heart overcooked, feels hot inside.  Using ice packs on sides to cool down.  Started few weeks ago when she had some discomfort in abdomen.  These sensations will build up slowly and get worse.    Has taken some tylenol at bedtime for fever.   Denies any current abdominal or back pain.  No blood in stool or urine.  No urinary frequency, no odor.  No diarrhea.   Last BM today, normal.  No headache.    Larey Seat recently, had torn left rotator cuff, seeing ortho for this.  No surgery was done, she says they felt she was too old to perform this surgery.   Using a walker.  Her friend Olegario Messier brought her here today.  No chest pain, no dyspnea.  No swelling in legs.  Eating 3 times per day.  Is hydrating ok in her opinion.  She has fungal infection in left armpit.  washing area carefully , using cream OTC but not helping.  Been using cream since 5/10 but not improving  Has endoscopy soon in [place] to evaluate anemia and weight loss  No other aggravating or relieving factors.    No other c/o.  No past medical history on file.   Current Outpatient Medications on File Prior to Visit  Medication Sig Dispense Refill  . acetaminophen (TYLENOL) 325 MG tablet Take by mouth.    Marland Kitchen alendronate (FOSAMAX) 10 MG tablet Take by mouth.    . bimatoprost (LUMIGAN) 0.01 % SOLN Place 1 drop into the left eye at bedtime.    . brinzolamide  (AZOPT) 1 % ophthalmic suspension Place 1 drop into the left eye 2 (two) times daily.    . Cholecalciferol (VITAMIN D3) 10 MCG (400 UNIT) tablet Take by mouth.    . melatonin 3 MG TABS tablet Take 3 mg by mouth at bedtime.    . mirtazapine (REMERON) 7.5 MG tablet TAKE 1 TABLET BY MOUTH EVERY DAY NIGHTLY    . Multiple Vitamins-Minerals (MULTIVITAMIN WITH MINERALS) tablet Take 2 tablets by mouth daily.    Marland Kitchen olmesartan (BENICAR) 40 MG tablet Take 40 mg by mouth daily.    Marland Kitchen omeprazole (PRILOSEC) 40 MG capsule Take by mouth.    . sertraline (ZOLOFT) 50 MG tablet Take by mouth.    . simvastatin (ZOCOR) 20 MG tablet Take 1 tablet by mouth daily.    . tamsulosin (FLOMAX) 0.4 MG CAPS capsule Take by mouth.     No current facility-administered medications on file prior to visit.     The following portions of the patient's history were reviewed and updated as appropriate: allergies, current medications, past family history, past medical history, past social history, past surgical history and problem list.  ROS Otherwise as in subjective above    Objective: BP 92/60   Pulse 79   Resp (!) 98   Wt 116 lb 6.4  oz (52.8 kg)   SpO2 96%   BMI 18.79 kg/m   Wt Readings from Last 3 Encounters:  05/27/20 116 lb 6.4 oz (52.8 kg)  01/01/20 134 lb 6.4 oz (61 kg)   BP Readings from Last 3 Encounters:  05/27/20 92/60  01/01/20 130/78   General appearance: alert, no distress, well developed, well nourished, white female Skin: yellowish faint resolving bruising of forehead between eyes and over superior nose and face under orbits bilat, otherwise warm and dry Left axilla with few spots of erythema suggestive of tinea HEENT: normocephalic, sclerae anicteric, conjunctiva pink and moist, TMs pearly, nares patent, no discharge or erythema, pharynx normal Oral cavity: MMM, no lesions Neck: supple, no lymphadenopathy, no thyromegaly, no masses Heart: RRR, normal S1, S2, no murmurs Lungs: CTA bilaterally,  no wheezes, rhonchi, or rales Abdomen: +bs, soft, non tender, non distended, no masses, no hepatomegaly, no splenomegaly Back nontender Pulses: 2+ radial pulses, 2+ pedal pulses, normal cap refill Ext: no edema    Assessment: Encounter Diagnoses  Name Primary?  . Fever, unspecified fever cause Yes  . Weight loss   . Tinea corporis   . Anemia, unspecified type      Plan: We discussed your symptoms and concerns  You are not tender in the office today.  You did not seem to be in any distress.  We will check some labs and urine for further evaluation  Fever   Your exam is unremarkable today with you have had some recent weight loss  We will check some labs and urine today  You can use Tylenol 325mg  at bedtime this evening for fever, or you can use Tylenol every 4-6 hours as needed for fever   Avoid hot sauce, spicy foods or other food items that would continue to make you feel flushed or hot  Stay cool indoors  Hydrate well with water throughout the day  Tinea  Your exam shows a small area of fungus under the left armpit  Begin nystatin powder in the morning and at bedtime.  If we can keep the armpit dry, then the fungus should clear up  You can still use her antifungal cream over-the-counter midday  Try to sit with your left arm elevated to air out the armpit when possible  Anemia, weight loss  Monitor your weight.  If you continue to lose weight such as 5 pounds or more in the next week or 2 then let or your PCP in [facility] know  Follow-up with your gastroenterologist soon for the endoscopy as planned given recent anemia and weight loss  If you discover lymph nodes swollen or other new findings or new symptoms then recheck  She was unable to leave urine.  She will take urine cup and bring this back.   [name] was seen today for rash.  Diagnoses and all orders for this visit:  Fever, unspecified fever cause -     POCT Urinalysis DIP (Proadvantage  Device) -     Comprehensive metabolic panel -     CBC with Differential/Platelet  Weight loss -     POCT Urinalysis DIP (Proadvantage Device) -     Comprehensive metabolic panel -     CBC with Differential/Platelet  Tinea corporis -     POCT Urinalysis DIP (Proadvantage Device) -     Comprehensive metabolic panel -     CBC with Differential/Platelet  Anemia, unspecified type -     POCT Urinalysis DIP (Proadvantage Device) -  Comprehensive metabolic panel -     CBC with Differential/Platelet  Other orders -     nystatin (MYCOSTATIN/NYSTOP) powder; Apply 1 application topically 3 (three) times daily.    Follow up: pending labs

## 2020-05-27 NOTE — Patient Instructions (Signed)
We discussed your symptoms and concerns  You are not tender in the office today.  You did not seem to be in any distress.  We will check some labs and urine for further evaluation  Fever   Your exam is unremarkable today with you have had some recent weight loss  We will check some labs and urine today  You can use Tylenol 325mg  at bedtime this evening for fever, or you can use Tylenol every 4-6 hours as needed for fever   Avoid hot sauce, spicy foods or other food items that would continue to make you feel flushed or hot  Stay cool indoors  Hydrate well with water throughout the day  Tinea  Your exam shows a small area of fungus under the left armpit  Begin nystatin powder in the morning and at bedtime.  If we can keep the armpit dry, then the fungus should clear up  You can still use her antifungal cream over-the-counter midday  Try to sit with your left arm elevated to air out the armpit when possible  Anemia, weight loss  Monitor your weight.  If you continue to lose weight such as 5 pounds or more in the next week or 2 then let or your PCP in [facility] know  Follow-up with your gastroenterologist soon for the endoscopy as planned given recent anemia and weight loss  If you discover lymph nodes swollen or other new findings or new symptoms then recheck

## 2020-05-27 NOTE — Addendum Note (Signed)
Addended by: Jac Canavan on: 05/27/2020 03:36 PM   Modules accepted: Orders

## 2020-05-28 LAB — CBC WITH DIFFERENTIAL/PLATELET
Basophils Absolute: 0.1 10*3/uL (ref 0.0–0.2)
Basos: 1 %
EOS (ABSOLUTE): 0 10*3/uL (ref 0.0–0.4)
Eos: 0 %
Hematocrit: 38.5 % (ref 34.0–46.6)
Hemoglobin: 12.6 g/dL (ref 11.1–15.9)
Immature Grans (Abs): 0 10*3/uL (ref 0.0–0.1)
Immature Granulocytes: 0 %
Lymphocytes Absolute: 1.5 10*3/uL (ref 0.7–3.1)
Lymphs: 19 %
MCH: 29.5 pg (ref 26.6–33.0)
MCHC: 32.7 g/dL (ref 31.5–35.7)
MCV: 90 fL (ref 79–97)
Monocytes Absolute: 0.6 10*3/uL (ref 0.1–0.9)
Monocytes: 7 %
Neutrophils Absolute: 6 10*3/uL (ref 1.4–7.0)
Neutrophils: 73 %
Platelets: 343 10*3/uL (ref 150–450)
RBC: 4.27 x10E6/uL (ref 3.77–5.28)
RDW: 12.8 % (ref 11.7–15.4)
WBC: 8.2 10*3/uL (ref 3.4–10.8)

## 2020-05-28 LAB — COMPREHENSIVE METABOLIC PANEL
ALT: 13 IU/L (ref 0–32)
AST: 13 IU/L (ref 0–40)
Albumin/Globulin Ratio: 1.8 (ref 1.2–2.2)
Albumin: 4.4 g/dL (ref 3.6–4.6)
Alkaline Phosphatase: 82 IU/L (ref 44–121)
BUN/Creatinine Ratio: 18 (ref 12–28)
BUN: 15 mg/dL (ref 8–27)
Bilirubin Total: <0.2 mg/dL (ref 0.0–1.2)
CO2: 22 mmol/L (ref 20–29)
Calcium: 10 mg/dL (ref 8.7–10.3)
Chloride: 106 mmol/L (ref 96–106)
Creatinine, Ser: 0.84 mg/dL (ref 0.57–1.00)
Globulin, Total: 2.5 g/dL (ref 1.5–4.5)
Glucose: 99 mg/dL (ref 65–99)
Potassium: 5.6 mmol/L — ABNORMAL HIGH (ref 3.5–5.2)
Sodium: 144 mmol/L (ref 134–144)
Total Protein: 6.9 g/dL (ref 6.0–8.5)
eGFR: 69 mL/min/{1.73_m2} (ref >59)

## 2020-05-29 ENCOUNTER — Other Ambulatory Visit: Payer: Self-pay | Admitting: Medical

## 2020-05-29 MED ORDER — SULFAMETHOXAZOLE-TRIMETHOPRIM 800-160 MG PO TABS: 1.0000 | ORAL_TABLET | Freq: Two times a day (BID) | ORAL | 0 refills | Status: DC

## 2020-05-30 ENCOUNTER — Other Ambulatory Visit: Payer: Self-pay | Admitting: Medical

## 2020-05-30 LAB — URINE CULTURE

## 2020-05-30 MED ORDER — CIPROFLOXACIN HCL 500 MG PO TABS: 500.0000 mg | ORAL_TABLET | Freq: Two times a day (BID) | ORAL | 0 refills | Status: AC

## 2020-05-31 ENCOUNTER — Other Ambulatory Visit: Payer: Self-pay

## 2020-05-31 ENCOUNTER — Emergency Department (HOSPITAL_COMMUNITY): Payer: Medicare PPO

## 2020-05-31 ENCOUNTER — Other Ambulatory Visit: Payer: Self-pay | Admitting: Family Medicine

## 2020-05-31 ENCOUNTER — Emergency Department (HOSPITAL_COMMUNITY)
Admission: EM | Admit: 2020-05-31 | Discharge: 2020-06-01 | Disposition: A | Discharge status: Home / Self Care | Payer: Medicare PPO | Attending: Emergency Medicine | Admitting: Emergency Medicine

## 2020-05-31 DIAGNOSIS — R059 Cough, unspecified: Secondary | ICD-10-CM | POA: Insufficient documentation

## 2020-05-31 DIAGNOSIS — R531 Weakness: Secondary | ICD-10-CM | POA: Insufficient documentation

## 2020-05-31 DIAGNOSIS — U071 COVID-19: Secondary | ICD-10-CM

## 2020-05-31 DIAGNOSIS — R55 Syncope and collapse: Secondary | ICD-10-CM | POA: Insufficient documentation

## 2020-05-31 DIAGNOSIS — R6883 Chills (without fever): Secondary | ICD-10-CM | POA: Insufficient documentation

## 2020-05-31 LAB — CBC WITH DIFFERENTIAL/PLATELET
Abs Immature Granulocytes: 0.02 10*3/uL (ref 0.00–0.07)
Basophils Absolute: 0 10*3/uL (ref 0.0–0.1)
Basophils Relative: 1 %
Eosinophils Absolute: 0 10*3/uL (ref 0.0–0.5)
Eosinophils Relative: 0 %
HCT: 32 % — ABNORMAL LOW (ref 36.0–46.0)
Hemoglobin: 10.2 g/dL — ABNORMAL LOW (ref 12.0–15.0)
Immature Granulocytes: 0 %
Lymphocytes Relative: 18 %
Lymphs Abs: 1.1 10*3/uL (ref 0.7–4.0)
MCH: 29.3 pg (ref 26.0–34.0)
MCHC: 31.9 g/dL (ref 30.0–36.0)
MCV: 92 fL (ref 80.0–100.0)
Monocytes Absolute: 0.8 10*3/uL (ref 0.1–1.0)
Monocytes Relative: 14 %
Neutro Abs: 4 10*3/uL (ref 1.7–7.7)
Neutrophils Relative %: 67 %
Platelets: 241 10*3/uL (ref 150–400)
RBC: 3.48 MIL/uL — ABNORMAL LOW (ref 3.87–5.11)
RDW: 14 % (ref 11.5–15.5)
WBC: 5.9 10*3/uL (ref 4.0–10.5)
nRBC: 0 % (ref 0.0–0.2)

## 2020-05-31 IMAGING — DX DG CHEST 1V PORT
1 series · 1 of 1 positions shown · non-contrast
Comparison: None.

CLINICAL DATA: Near syncope

EXAM:
PORTABLE CHEST 1 VIEW

[chest ap]
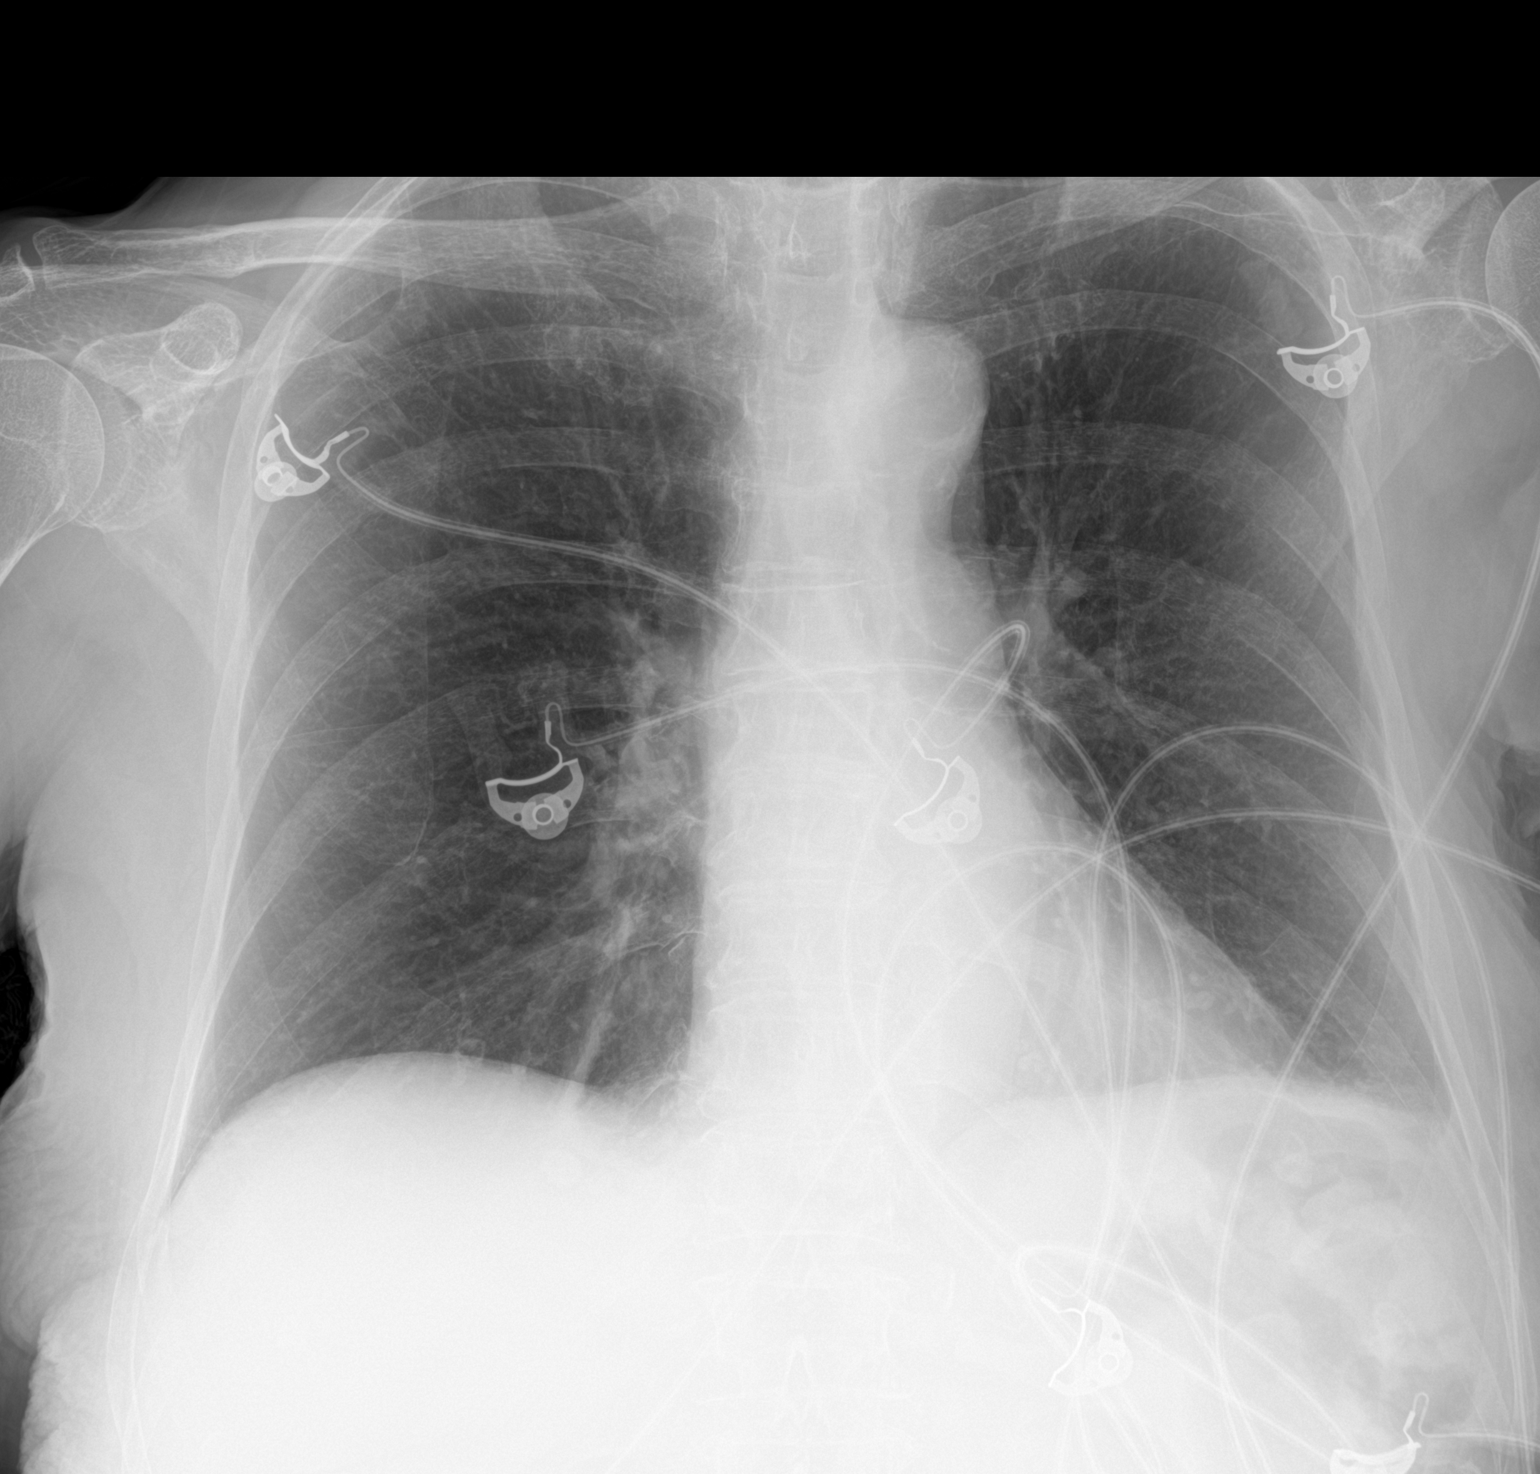

[1 of 1 positions shown; findings below may reference images not displayed]

FINDINGS: The heart size and mediastinal contours are within normal limits.
Aortic atherosclerosis. Atelectasis or scar at the medial right
base. Both lungs are clear. The visualized skeletal structures are
unremarkable.
IMPRESSION: No active disease.

## 2020-05-31 MED ORDER — PAXLOVID 20 X 150 MG & 10 X 100MG PO TBPK: 3.0000 | ORAL_TABLET | Freq: Two times a day (BID) | ORAL | 0 refills | Status: DC

## 2020-05-31 MED ORDER — MIRTAZAPINE 7.5 MG PO TABS
15.0000 mg | ORAL_TABLET | Freq: Every day | ORAL | Status: DC
  Administered 2020-06-01: 15 mg via ORAL
  Filled 2020-05-31: qty 2

## 2020-05-31 MED ORDER — LACTATED RINGERS IV BOLUS
1000.0000 mL | Freq: Once | INTRAVENOUS | Status: AC
  Administered 2020-05-31: 1000 mL via INTRAVENOUS

## 2020-05-31 NOTE — Progress Notes (Signed)
   Subjective:    Patient ID: Sheila Andrade, female    DOB: March 19, 1935, 85 y.o.   MRN: [id number]  HPI Patient and daughter report patient tested positive for Covid today. Symptoms started yesterday. Complains of fatigue, generalized weakness, low grade fever, rhinorrhea, nasal congestion, and dry cough.   No chest pain, shortness of breath, N/V/D   Review of Systems     Objective:   Physical Exam        Assessment & Plan:  COVID-19 virus infection - Plan: Nirmatrelvir & Ritonavir (PAXLOVID) 20 x 150 MG & 10 x 100MG  TBPK  She was educated on how to take the medication, potential side effects and to stop simvastatin for 10 days.

## 2020-05-31 NOTE — ED Provider Notes (Signed)
[facility]-EMERGENCY DEPT Provider Note   CSN: [id number] Arrival date & time: 05/31/20  2101     History Chief Complaint  Patient presents with  . Near Syncope  . Weakness    Sheila Andrade is a 85 y.o. female.  Patient is an 85 year old female with a history of hypertension, hyperlipidemia, mobility issues after a fall but otherwise is relatively healthy presenting today due to near syncope and generalized weakness.  Patient states 2 days ago she noticed a bit of a dry cough and some chills.  It seemed to be worse today and when she tried to get up and walk she started feeling very lightheaded and crumpled to the floor but her daughter prevented her from falling.  She denies any shortness of breath or chest pain.  She did take a COVID test at home today which came back positive and was prescribed Paxlovid.  She has not had productive cough, abdominal pain, nausea vomiting or diarrhea.  She reports her intake has been normal.  She does take blood pressure medication and has taken it today.  After the episode at home today her blood pressure was low and they came to the emergency room for further evaluation.  She currently has no complaints.  The history is provided by the patient.  Near Syncope  Weakness Associated symptoms: near-syncope        No past medical history on file.  Patient Active Problem List   Diagnosis Date Noted  . Fever 05/27/2020  . Weight loss 05/27/2020  . Tinea corporis 05/27/2020  . Anemia 05/27/2020    No past surgical history on file.   OB History   No obstetric history on file.     No family history on file.  Social History   Tobacco Use  . Smoking status: Never Smoker  . Smokeless tobacco: Never Used  Substance Use Topics  . Drug use: Never    Home Medications Prior to Admission medications   Medication Sig Start Date End Date Taking? Authorizing Provider  ciprofloxacin (CIPRO) 500 MG tablet Take 1 tablet (500  mg total) by mouth 2 (two) times daily for 5 days. 05/30/20 06/04/20  Tysinger, Kermit Balo, PA-C  acetaminophen (TYLENOL) 325 MG tablet Take by mouth. 03/15/20   [provider]  alendronate (FOSAMAX) 10 MG tablet Take by mouth.    [provider]  bimatoprost (LUMIGAN) 0.01 % SOLN Place 1 drop into the left eye at bedtime. 06/19/13   [provider]  brinzolamide (AZOPT) 1 % ophthalmic suspension Place 1 drop into the left eye 2 (two) times daily. 01/04/19   [provider]  Cholecalciferol (VITAMIN D3) 10 MCG (400 UNIT) tablet Take by mouth.    [provider]  melatonin 3 MG TABS tablet Take 3 mg by mouth at bedtime.    [provider]  mirtazapine (REMERON) 7.5 MG tablet TAKE 1 TABLET BY MOUTH EVERY DAY NIGHTLY 05/01/20   [provider]  Multiple Vitamins-Minerals (MULTIVITAMIN WITH MINERALS) tablet Take 2 tablets by mouth daily.    [provider]  Nirmatrelvir & Ritonavir (PAXLOVID) 20 x 150 MG & 10 x 100MG  TBPK Take 3 tablets by mouth 2 (two) times daily. 05/31/20   [name], [name] L, NP-C  nystatin (MYCOSTATIN/NYSTOP) powder Apply 1 application topically 3 (three) times daily. 05/27/20   Tysinger, 05/29/20, PA-C  olmesartan (BENICAR) 40 MG tablet Take 40 mg by mouth daily. 11/02/17   [provider]  omeprazole (PRILOSEC)  40 MG capsule Take by mouth. 05/10/20   [provider]  sertraline (ZOLOFT) 50 MG tablet Take by mouth. 05/01/20   [provider]  simvastatin (ZOCOR) 20 MG tablet Take 1 tablet by mouth daily. 05/10/20   [provider]  sulfamethoxazole-trimethoprim (BACTRIM DS) 800-160 MG tablet Take 1 tablet by mouth 2 (two) times daily. 05/29/20   Tysinger, Kermit Balo, PA-C  tamsulosin [facility]) 0.4 MG CAPS capsule Take by mouth. 04/30/20 04/30/21  [provider]    Allergies    Patient has no known allergies.  Review of Systems   Review of Systems  Cardiovascular: Positive for  near-syncope.  Neurological: Positive for weakness.  All other systems reviewed and are negative.   Physical Exam Updated Vital Signs BP 99/62 (BP Location: Left Arm)   Pulse 81   Temp 98.8 F (37.1 C) (Oral)   Resp (!) 22   Ht 5\' 7"  (1.702 m)   Wt 54.4 kg   SpO2 96%   BMI 18.79 kg/m   Physical Exam Vitals and nursing note reviewed.  Constitutional:      General: She is not in acute distress.    Appearance: Normal appearance. She is well-developed and normal weight.  HENT:     Head: Normocephalic and atraumatic.     Mouth/Throat:     Mouth: Mucous membranes are moist.  Eyes:     Pupils: Pupils are equal, round, and reactive to light.  Cardiovascular:     Rate and Rhythm: Normal rate and regular rhythm.     Heart sounds: Normal heart sounds. No murmur heard. No friction rub.  Pulmonary:     Effort: Pulmonary effort is normal.     Breath sounds: Normal breath sounds. No wheezing or rales.  Abdominal:     General: Bowel sounds are normal. There is no distension.     Palpations: Abdomen is soft.     Tenderness: There is no abdominal tenderness. There is no guarding or rebound.  Musculoskeletal:        General: No tenderness. Normal range of motion.     Comments: No edema  Skin:    General: Skin is warm and dry.     Findings: No rash.  Neurological:     Mental Status: She is alert and oriented to person, place, and time. Mental status is at baseline.     Cranial Nerves: No cranial nerve deficit.  Psychiatric:        Mood and Affect: Mood normal.        Behavior: Behavior normal.     ED Results / Procedures / Treatments   Labs (all labs ordered are listed, but only abnormal results are displayed) Labs Reviewed  CBC WITH DIFFERENTIAL/PLATELET  COMPREHENSIVE METABOLIC PANEL  URINALYSIS, ROUTINE W REFLEX MICROSCOPIC  LACTIC ACID, PLASMA  TROPONIN I (HIGH SENSITIVITY)    EKG None  Radiology DG Chest Port 1 View  Result Date: 05/31/2020 CLINICAL DATA:   Near syncope EXAM: PORTABLE CHEST 1 VIEW COMPARISON:  None. FINDINGS: The heart size and mediastinal contours are within normal limits. Aortic atherosclerosis. Atelectasis or scar at the medial right base. Both lungs are clear. The visualized skeletal structures are unremarkable. IMPRESSION: No active disease. Electronically Signed   By: 06/02/2020 M.D.   On: 05/31/2020 21:50    Procedures Procedures   Medications Ordered in ED Medications  lactated ringers bolus 1,000 mL (has no administration in time range)    ED Course  I have reviewed  the triage vital signs and the nursing notes.  Pertinent labs & imaging results that were available during my care of the patient were reviewed by me and considered in my medical decision making (see chart for details).    MDM Rules/Calculators/A&P                          Elderly female who has had COVID-vaccine and booster presenting today with near syncopal event at home and ongoing hypotension with pressures in the high 90s with normal maps.  Patient denies any shortness of breath or chest pain.  She is well-appearing on exam and able to give a full history.  She denies any abdominal pain and has no lower extremity swelling.  She took a home COVID test today that was positive but has had symptoms for the past 2 days.  She received a prescription for Paxlovid today.  After this event they decided to come to the emergency room for further evaluation.  Low suspicion that patient's symptoms are related to PE, dissection, AAA rupture.  Patient reports she has been eating and drinking and denies any nausea vomiting or diarrhea.  She did take her blood pressure medication today but blood pressure is soft.  Patient only received 50 mL of fluid prior to arrival.  Will give a liter of fluid.  Chest x-ray within normal limits.  EKG and labs are pending.   Final Clinical Impression(s) / ED Diagnoses Final diagnoses:  None    Rx / DC Orders ED Discharge  Orders    None       Gwyneth Sprout, MD 06/03/20 [id number]

## 2020-05-31 NOTE — ED Triage Notes (Addendum)
Pt came from home via EMS. Per EMS, pt had a near syncopal episode Monday and again today. Pt denies pain. Pt reports left pelvis fx 6 months ago from a fall, hospitalized for urinary retention at that time. New dx of covid positive as of today from a home test. Pt currently being tx for UTI, been on antibx for about 36 hours. Pt denies falling today, not on blood thinners, no head trauma, no obvious injury  Systolic 86 upon EMS arrival on scene CBG: 198

## 2020-06-01 LAB — URINALYSIS, ROUTINE W REFLEX MICROSCOPIC
Bilirubin Urine: NEGATIVE
Glucose, UA: NEGATIVE mg/dL
Hgb urine dipstick: NEGATIVE
Ketones, ur: NEGATIVE mg/dL
Leukocytes,Ua: NEGATIVE
Nitrite: NEGATIVE
Protein, ur: NEGATIVE mg/dL
Specific Gravity, Urine: 1.021 (ref 1.005–1.030)
pH: 5 (ref 5.0–8.0)

## 2020-06-01 LAB — COMPREHENSIVE METABOLIC PANEL WITH GFR
ALT: 17 U/L (ref 0–44)
AST: 23 U/L (ref 15–41)
Albumin: 3.3 g/dL — ABNORMAL LOW (ref 3.5–5.0)
Alkaline Phosphatase: 49 U/L (ref 38–126)
Anion gap: 7 (ref 5–15)
BUN: 26 mg/dL — ABNORMAL HIGH (ref 8–23)
CO2: 22 mmol/L (ref 22–32)
Calcium: 8.8 mg/dL — ABNORMAL LOW (ref 8.9–10.3)
Chloride: 108 mmol/L (ref 98–111)
Creatinine, Ser: 0.85 mg/dL (ref 0.44–1.00)
GFR, Estimated: >60 mL/min
Glucose, Bld: 108 mg/dL — ABNORMAL HIGH (ref 70–99)
Potassium: 3.9 mmol/L (ref 3.5–5.1)
Sodium: 137 mmol/L (ref 135–145)
Total Bilirubin: 0.5 mg/dL (ref 0.3–1.2)
Total Protein: 6.6 g/dL (ref 6.5–8.1)

## 2020-06-01 LAB — LACTIC ACID, PLASMA: Lactic Acid, Venous: 1.1 mmol/L (ref 0.5–1.9)

## 2020-06-01 LAB — TROPONIN I (HIGH SENSITIVITY)
Troponin I (High Sensitivity): 7 ng/L (ref <18)
Troponin I (High Sensitivity): 7 ng/L (ref <18)

## 2020-06-01 NOTE — ED Notes (Signed)
Pt ambulated in room with walker with minimal assistance. O2 saturation fluctuated between 96%-100% on room air while ambulating

## 2020-06-06 ENCOUNTER — Encounter: Payer: Self-pay | Admitting: Family Medicine

## 2020-06-06 ENCOUNTER — Telehealth: Payer: Self-pay | Admitting: Internal Medicine

## 2020-06-06 DIAGNOSIS — R232 Flushing: Secondary | ICD-10-CM

## 2020-06-06 HISTORY — DX: Flushing: R23.2

## 2020-06-06 NOTE — Telephone Encounter (Signed)
[facility] called and we will do plan of care for PT from [facility]. [name] agreed to this

## 2020-06-14 DIAGNOSIS — I471 Supraventricular tachycardia: Secondary | ICD-10-CM | POA: Insufficient documentation

## 2020-07-02 DIAGNOSIS — F322 Major depressive disorder, single episode, severe without psychotic features: Secondary | ICD-10-CM | POA: Insufficient documentation

## 2020-07-14 ENCOUNTER — Emergency Department (HOSPITAL_COMMUNITY): Payer: Medicare PPO

## 2020-07-14 ENCOUNTER — Inpatient Hospital Stay (HOSPITAL_COMMUNITY)
Admission: EM | Admit: 2020-07-14 | Discharge: 2020-08-02 | DRG: 641 | Disposition: A | Discharge status: Rehabilitation Facility | Payer: Medicare PPO | Attending: Internal Medicine | Admitting: Internal Medicine

## 2020-07-14 ENCOUNTER — Encounter (HOSPITAL_COMMUNITY): Payer: Self-pay | Admitting: Emergency Medicine

## 2020-07-14 ENCOUNTER — Other Ambulatory Visit: Payer: Self-pay

## 2020-07-14 DIAGNOSIS — F419 Anxiety disorder, unspecified: Secondary | ICD-10-CM | POA: Diagnosis present

## 2020-07-14 DIAGNOSIS — F333 Major depressive disorder, recurrent, severe with psychotic symptoms: Secondary | ICD-10-CM

## 2020-07-14 DIAGNOSIS — N390 Urinary tract infection, site not specified: Secondary | ICD-10-CM | POA: Diagnosis not present

## 2020-07-14 DIAGNOSIS — G4700 Insomnia, unspecified: Secondary | ICD-10-CM | POA: Diagnosis present

## 2020-07-14 DIAGNOSIS — M810 Age-related osteoporosis without current pathological fracture: Secondary | ICD-10-CM | POA: Diagnosis present

## 2020-07-14 DIAGNOSIS — R5383 Other fatigue: Secondary | ICD-10-CM

## 2020-07-14 DIAGNOSIS — E871 Hypo-osmolality and hyponatremia: Secondary | ICD-10-CM | POA: Diagnosis not present

## 2020-07-14 DIAGNOSIS — E440 Moderate protein-calorie malnutrition: Secondary | ICD-10-CM | POA: Diagnosis present

## 2020-07-14 DIAGNOSIS — K5900 Constipation, unspecified: Secondary | ICD-10-CM | POA: Diagnosis present

## 2020-07-14 DIAGNOSIS — F332 Major depressive disorder, recurrent severe without psychotic features: Secondary | ICD-10-CM | POA: Diagnosis present

## 2020-07-14 DIAGNOSIS — D649 Anemia, unspecified: Secondary | ICD-10-CM | POA: Diagnosis not present

## 2020-07-14 DIAGNOSIS — R45851 Suicidal ideations: Secondary | ICD-10-CM | POA: Diagnosis present

## 2020-07-14 DIAGNOSIS — I10 Essential (primary) hypertension: Secondary | ICD-10-CM | POA: Diagnosis present

## 2020-07-14 DIAGNOSIS — Z7983 Long term (current) use of bisphosphonates: Secondary | ICD-10-CM

## 2020-07-14 DIAGNOSIS — E860 Dehydration: Principal | ICD-10-CM | POA: Diagnosis present

## 2020-07-14 DIAGNOSIS — E785 Hyperlipidemia, unspecified: Secondary | ICD-10-CM | POA: Diagnosis present

## 2020-07-14 DIAGNOSIS — E538 Deficiency of other specified B group vitamins: Secondary | ICD-10-CM | POA: Diagnosis present

## 2020-07-14 DIAGNOSIS — Z79899 Other long term (current) drug therapy: Secondary | ICD-10-CM

## 2020-07-14 DIAGNOSIS — E43 Unspecified severe protein-calorie malnutrition: Secondary | ICD-10-CM | POA: Insufficient documentation

## 2020-07-14 DIAGNOSIS — E559 Vitamin D deficiency, unspecified: Secondary | ICD-10-CM | POA: Diagnosis present

## 2020-07-14 DIAGNOSIS — Z8616 Personal history of COVID-19: Secondary | ICD-10-CM

## 2020-07-14 DIAGNOSIS — D6489 Other specified anemias: Secondary | ICD-10-CM | POA: Diagnosis present

## 2020-07-14 DIAGNOSIS — Z681 Body mass index (BMI) 19 or less, adult: Secondary | ICD-10-CM

## 2020-07-14 DIAGNOSIS — R634 Abnormal weight loss: Secondary | ICD-10-CM | POA: Diagnosis present

## 2020-07-14 DIAGNOSIS — Z79818 Long term (current) use of other agents affecting estrogen receptors and estrogen levels: Secondary | ICD-10-CM

## 2020-07-14 DIAGNOSIS — R630 Anorexia: Secondary | ICD-10-CM | POA: Diagnosis present

## 2020-07-14 DIAGNOSIS — R627 Adult failure to thrive: Secondary | ICD-10-CM | POA: Diagnosis present

## 2020-07-14 DIAGNOSIS — G20 Parkinson's disease: Secondary | ICD-10-CM

## 2020-07-14 DIAGNOSIS — H409 Unspecified glaucoma: Secondary | ICD-10-CM | POA: Diagnosis present

## 2020-07-14 DIAGNOSIS — R32 Unspecified urinary incontinence: Secondary | ICD-10-CM | POA: Diagnosis present

## 2020-07-14 DIAGNOSIS — F688 Other specified disorders of adult personality and behavior: Secondary | ICD-10-CM | POA: Diagnosis present

## 2020-07-14 HISTORY — DX: Essential (primary) hypertension: I10

## 2020-07-14 LAB — CBC WITH DIFFERENTIAL/PLATELET
Abs Immature Granulocytes: 0.02 10*3/uL (ref 0.00–0.07)
Basophils Absolute: 0 10*3/uL (ref 0.0–0.1)
Basophils Relative: 1 %
Eosinophils Absolute: 0.1 10*3/uL (ref 0.0–0.5)
Eosinophils Relative: 1 %
HCT: 39.1 % (ref 36.0–46.0)
Hemoglobin: 12.4 g/dL (ref 12.0–15.0)
Immature Granulocytes: 0 %
Lymphocytes Relative: 26 %
Lymphs Abs: 1.9 10*3/uL (ref 0.7–4.0)
MCH: 28.4 pg (ref 26.0–34.0)
MCHC: 31.7 g/dL (ref 30.0–36.0)
MCV: 89.5 fL (ref 80.0–100.0)
Monocytes Absolute: 0.6 10*3/uL (ref 0.1–1.0)
Monocytes Relative: 9 %
Neutro Abs: 4.8 10*3/uL (ref 1.7–7.7)
Neutrophils Relative %: 63 %
Platelets: 349 10*3/uL (ref 150–400)
RBC: 4.37 MIL/uL (ref 3.87–5.11)
RDW: 14.3 % (ref 11.5–15.5)
WBC: 7.5 10*3/uL (ref 4.0–10.5)
nRBC: 0 % (ref 0.0–0.2)

## 2020-07-14 LAB — COMPREHENSIVE METABOLIC PANEL
ALT: 15 U/L (ref 0–44)
AST: 20 U/L (ref 15–41)
Albumin: 4 g/dL (ref 3.5–5.0)
Alkaline Phosphatase: 59 U/L (ref 38–126)
Anion gap: 9 (ref 5–15)
BUN: 16 mg/dL (ref 8–23)
CO2: 23 mmol/L (ref 22–32)
Calcium: 9.4 mg/dL (ref 8.9–10.3)
Chloride: 98 mmol/L (ref 98–111)
Creatinine, Ser: 0.6 mg/dL (ref 0.44–1.00)
GFR, Estimated: >60 mL/min (ref >60)
Glucose, Bld: 97 mg/dL (ref 70–99)
Potassium: 4.2 mmol/L (ref 3.5–5.1)
Sodium: 130 mmol/L — ABNORMAL LOW (ref 135–145)
Total Bilirubin: 0.6 mg/dL (ref 0.3–1.2)
Total Protein: 7.2 g/dL (ref 6.5–8.1)

## 2020-07-14 LAB — URINALYSIS, ROUTINE W REFLEX MICROSCOPIC
Bilirubin Urine: NEGATIVE
Glucose, UA: NEGATIVE mg/dL
Hgb urine dipstick: NEGATIVE
Ketones, ur: NEGATIVE mg/dL
Leukocytes,Ua: NEGATIVE
Nitrite: POSITIVE — AB
Protein, ur: NEGATIVE mg/dL
Specific Gravity, Urine: 1.014 (ref 1.005–1.030)
pH: 7 (ref 5.0–8.0)

## 2020-07-14 LAB — RESP PANEL BY RT-PCR (FLU A&B, COVID) ARPGX2
Influenza A by PCR: NEGATIVE
Influenza B by PCR: NEGATIVE
SARS Coronavirus 2 by RT PCR: NEGATIVE

## 2020-07-14 LAB — AMMONIA: Ammonia: 28 umol/L (ref 9–35)

## 2020-07-14 LAB — TSH: TSH: 1.43 u[IU]/mL (ref 0.350–4.500)

## 2020-07-14 LAB — LACTIC ACID, PLASMA: Lactic Acid, Venous: 1.1 mmol/L (ref 0.5–1.9)

## 2020-07-14 IMAGING — MR MR HEAD W/O CM
10 series · 41 of 48 positions shown · non-contrast
Comparison: None.

CLINICAL DATA: Mental status change.

EXAM:
MRI HEAD WITHOUT CONTRAST
TECHNIQUE: Multiplanar, multiecho pulse sequences of the brain and surrounding
structures were obtained without intravenous contrast.

[Series 5: dwi_tracew · axial · 3.0mm · 1.08mm/px · z∈[-58,+98]mm · 8 of 106 slices shown]
[im 1/106]
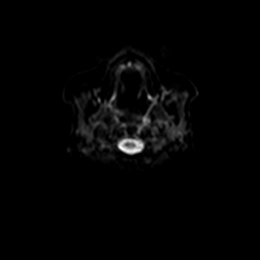
[im 22/106]
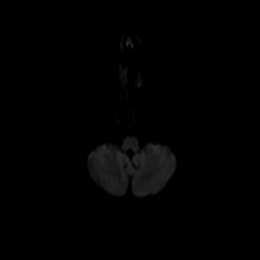
[im 32/106]
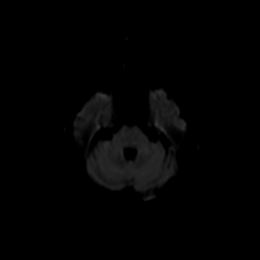
[im 43/106]
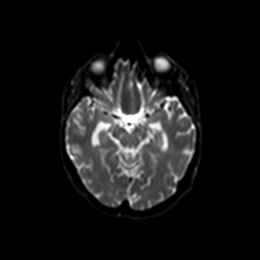
[im 64/106]
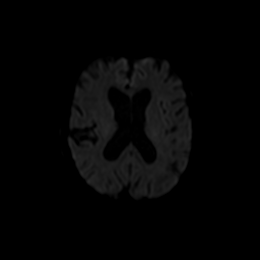
[im 74/106]
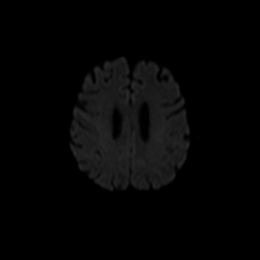
[im 85/106]
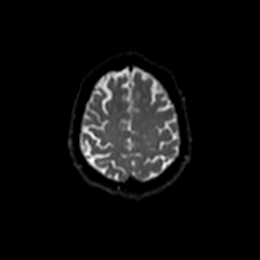
[im 106/106]
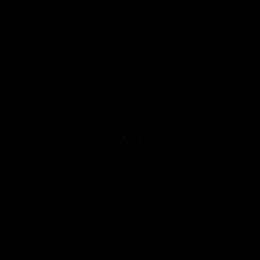

[Series 6: dwi_adc · axial · 3.0mm · 1.08mm/px · 1 of 53 slices shown]
[im 1/53]
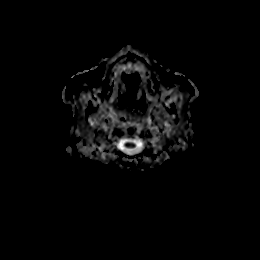

[Series 7: T2 · sagittal · 5.0mm · 0.47mm/px · 3 of 26 slices shown (1 of 3)]
[im 1/26]
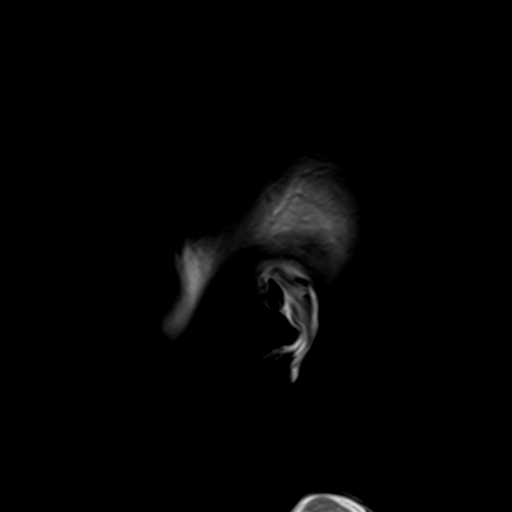
[im 13/26]
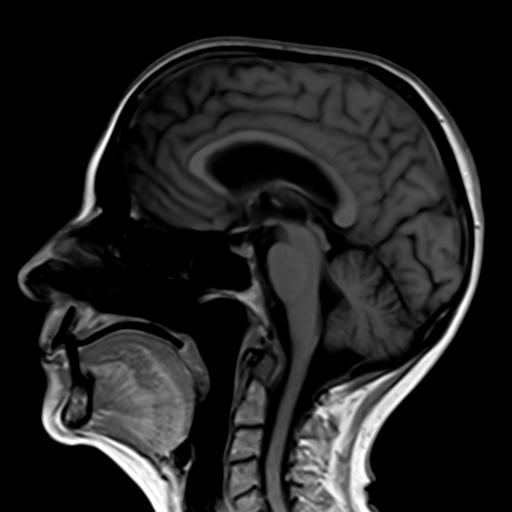
[im 26/26]
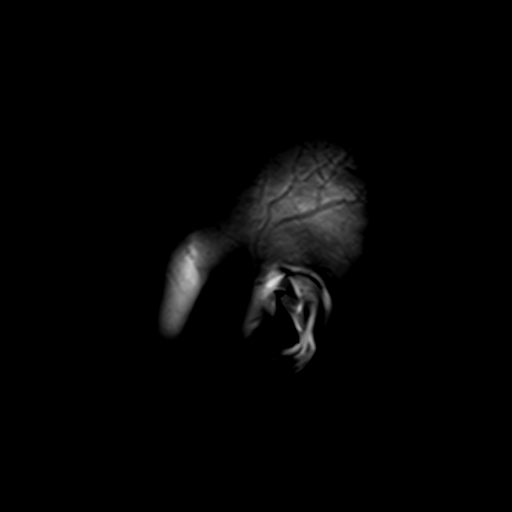

[Series 8: T2 · axial · 5.0mm · 0.45mm/px · z∈[-62,+93]mm · 2 of 25 slices shown (2 of 3)]
[im 1/25]
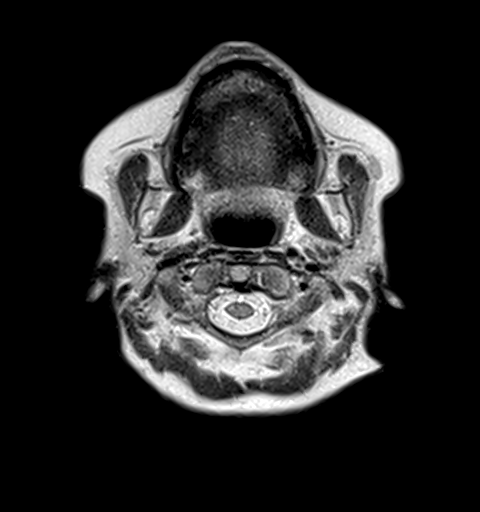
[im 25/25]
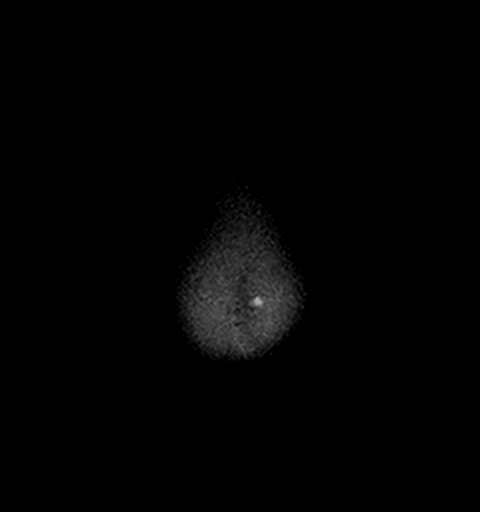

[Series 9: GRE · axial · 3.0mm · 0.45mm/px · z∈[-62,+94]mm · 5 of 53 slices shown]
[im 1/53]
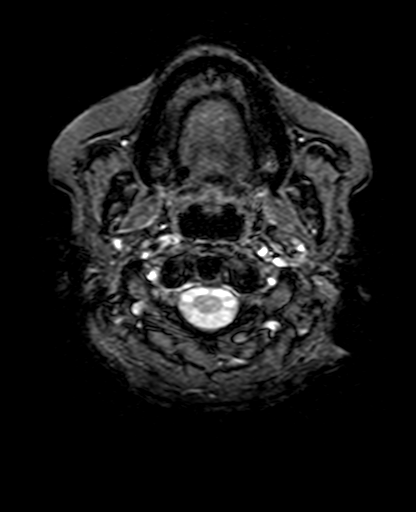
[im 14/53]
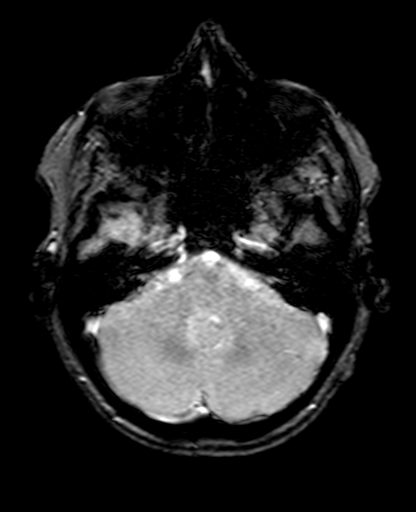
[im 27/53]
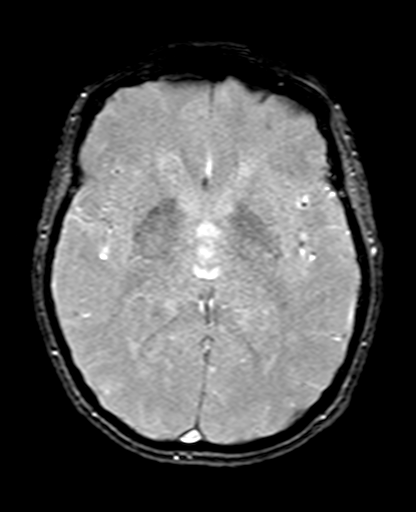
[im 40/53]
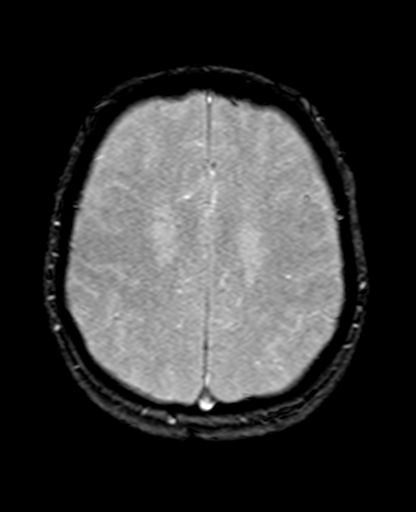
[im 53/53]
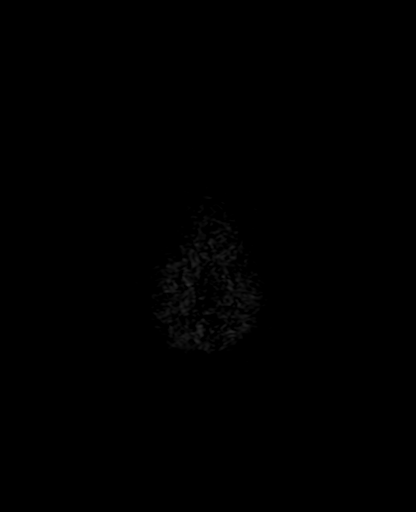

[Series 10: FLAIR · axial · 3.0mm · 0.86mm/px · z∈[-66,+95]mm · 5 of 55 slices shown]
[im 1/55]
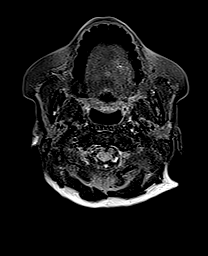
[im 14/55]
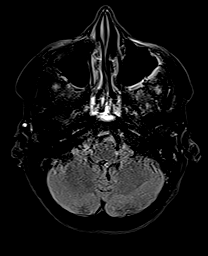
[im 28/55]
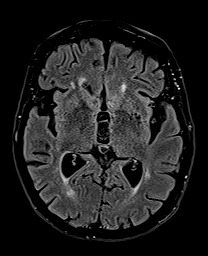
[im 41/55]
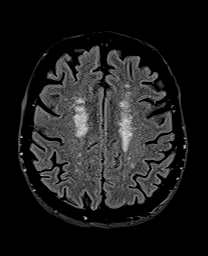
[im 55/55]
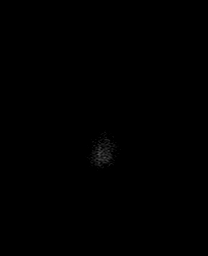

[Series 11: T1 · axial · 3.0mm · 0.45mm/px · z∈[-62,+94]mm · 5 of 53 slices shown]
[im 1/53]
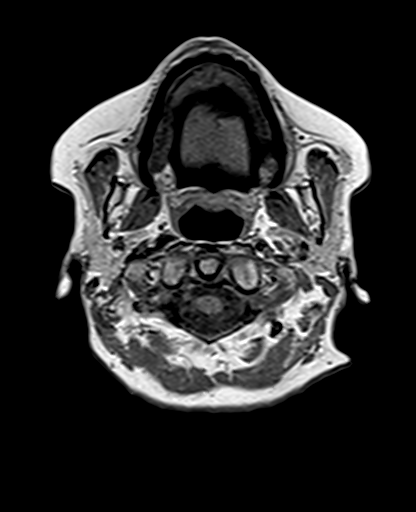
[im 14/53]
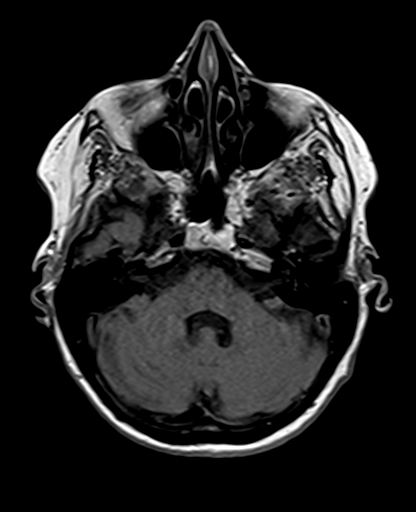
[im 27/53]
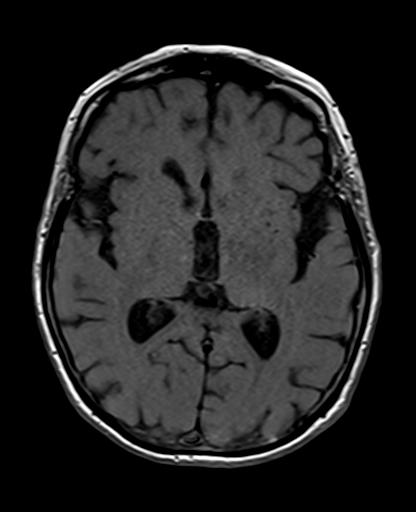
[im 40/53]
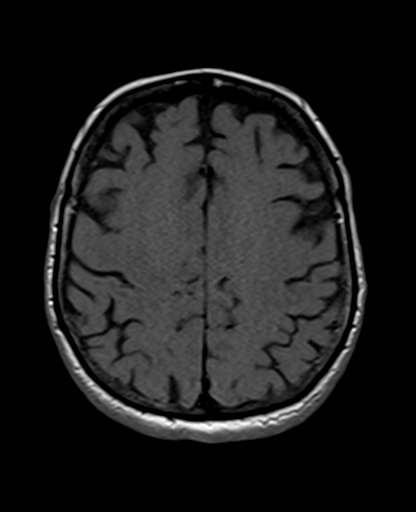
[im 53/53]
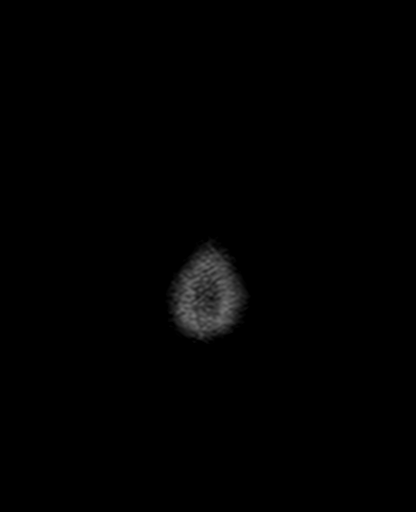

[Series 12: DWI · coronal · 5.0mm · 1.31mm/px · 6 of 60 slices shown (1 of 2)]
[im 1/60]
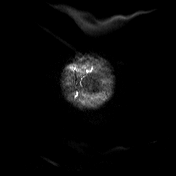
[im 12/60]
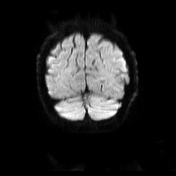
[im 24/60]
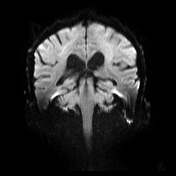
[im 36/60]
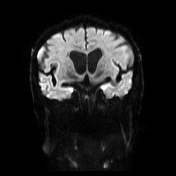
[im 48/60]
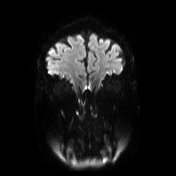
[im 60/60]
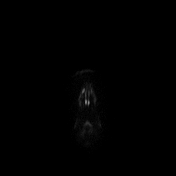

[Series 13: DWI · coronal · 5.0mm · 1.31mm/px · 3 of 30 slices shown (2 of 2)]
[im 1/30]
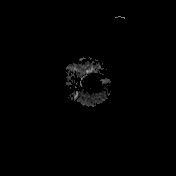
[im 15/30]
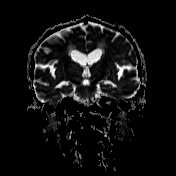
[im 30/30]
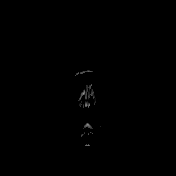

[Series 14: T2 · coronal · 5.0mm · 0.86mm/px · 3 of 29 slices shown (3 of 3)]
[im 1/29]
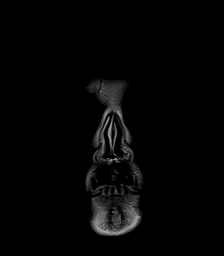
[im 15/29]
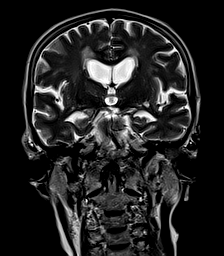
[im 29/29]
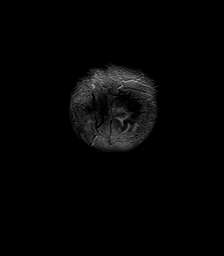

[41 of 48 positions shown; findings below may reference images not displayed]

FINDINGS: Brain: No acute infarction, acute hemorrhage, hydrocephalus,
extra-axial collection or mass lesion. Moderate scattered T2/FLAIR
hyperintensities within the white matter, which are nonspecific but
most likely related to chronic microvascular ischemic disease given
patient age. Mild-to-moderate atrophy with ex vacuo ventricular
dilation. The callosal angle is not acute and there is no crowding
of sulci at the vertex. Dilated perivascular space in the inferior
left basal ganglia. Small focus of susceptibility artifact in the
lateral left cerebellum, most likely a prior microhemorrhage.

Vascular: Major arterial flow voids are maintained skull base.

Skull and upper cervical spine: Normal marrow signal.

Sinuses/Orbits: Visualized sinuses are clear. No acute orbital
findings.

Other: Small left mastoid effusion.
IMPRESSION: 1. No evidence of acute intracranial abnormality
2. Moderate chronic microvascular ischemic disease and
mild-to-moderate atrophy.
3. Small left mastoid effusion.

## 2020-07-14 IMAGING — CR DG CHEST 2V
2 series · 2 of 2 positions shown · non-contrast
Comparison: Radiograph [DATE]

CLINICAL DATA: Failure to thrive, weight loss

EXAM:
CHEST - 2 VIEW

[w chest lat]
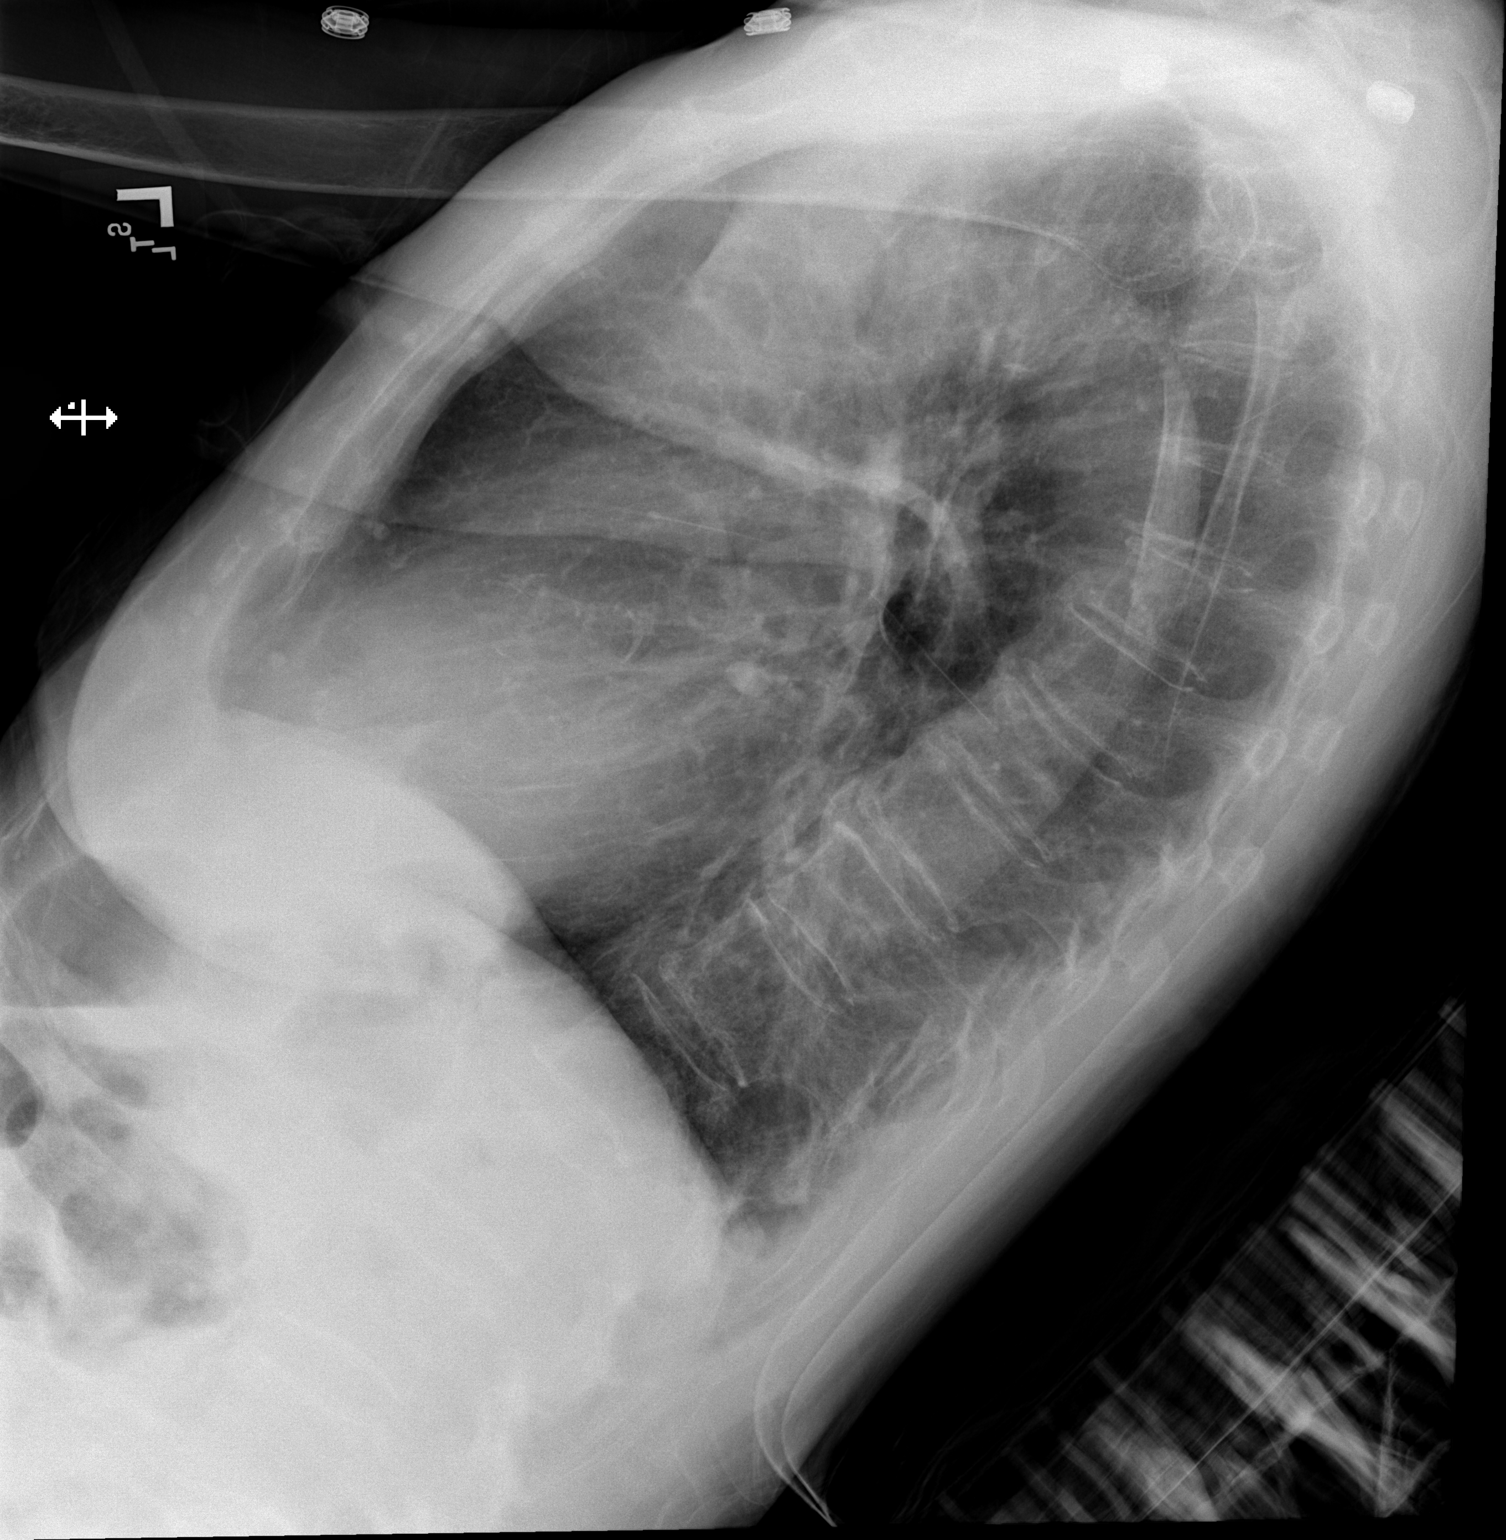

[x chest ap]
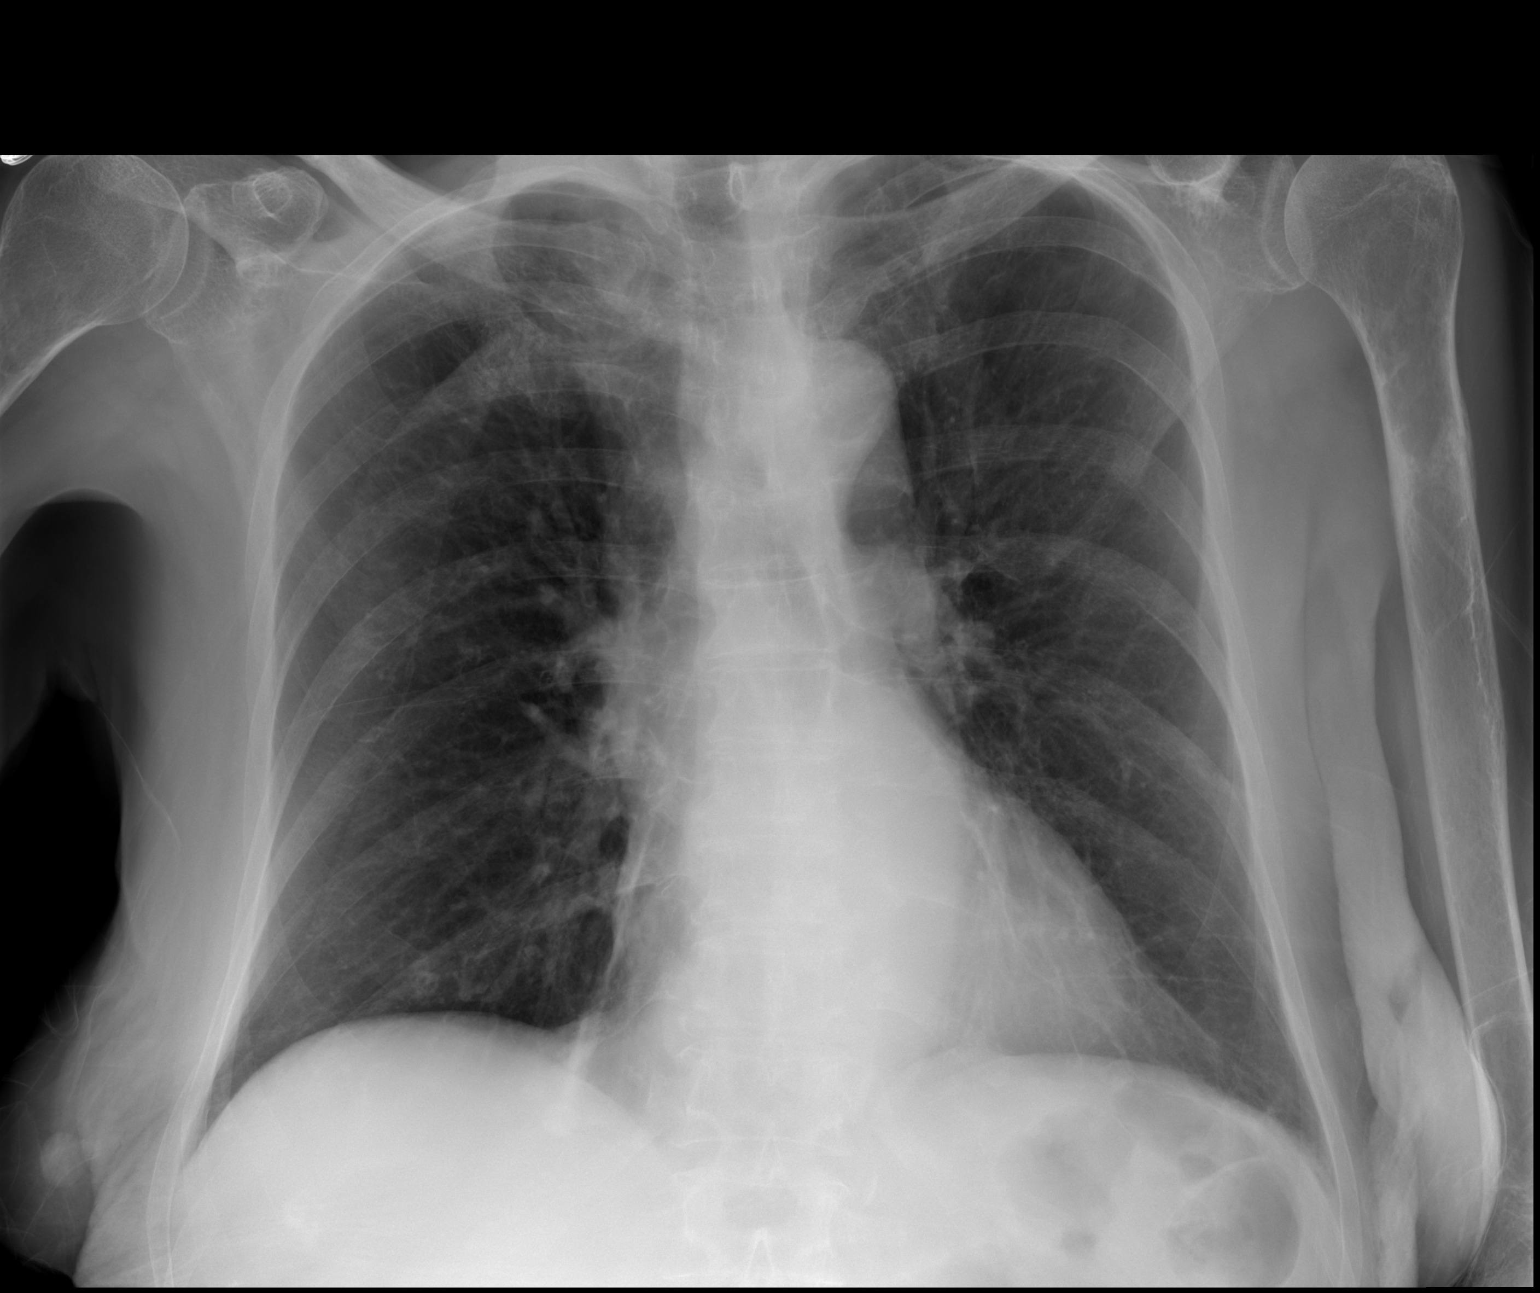

[2 of 2 positions shown; findings below may reference images not displayed]

FINDINGS: Suspect a small calcified mediastinal lymph node seen on lateral
radiograph. Remaining cardiomediastinal contours are unremarkable.
Bandlike opacity in the right lung base, likely scarring given
similar to prior. No consolidation, features of edema, pneumothorax,
or effusion. Degenerative changes are present in the imaged spine
and shoulders. Evidence of calcific tendinosis in the left shoulder.
Left cervical carotid atherosclerosis.
IMPRESSION: Suspect a small calcified mediastinal/hilar node. Can reflect
sequela of prior granulomatous disease.

No acute cardiopulmonary abnormality.

## 2020-07-14 MED ORDER — SODIUM CHLORIDE 0.9 % IV BOLUS
500.0000 mL | Freq: Once | INTRAVENOUS | Status: AC
  Administered 2020-07-14: 500 mL via INTRAVENOUS

## 2020-07-14 MED ORDER — SODIUM CHLORIDE 0.9 % IV SOLN
1.0000 g | Freq: Once | INTRAVENOUS | Status: AC
  Administered 2020-07-15: 1 g via INTRAVENOUS
  Filled 2020-07-14: qty 10

## 2020-07-14 MED ORDER — SODIUM CHLORIDE 0.9 % IV BOLUS
500.0000 mL | Freq: Once | INTRAVENOUS | Status: AC
  Administered 2020-07-15: 500 mL via INTRAVENOUS

## 2020-07-14 MED ORDER — TRAZODONE HCL 50 MG PO TABS
25.0000 mg | ORAL_TABLET | Freq: Every day | ORAL | Status: DC
  Administered 2020-07-14 – 2020-08-01 (×18): 25 mg via ORAL
  Filled 2020-07-14 (×19): qty 1

## 2020-07-14 NOTE — ED Provider Notes (Signed)
[facility]-EMERGENCY DEPT Provider Note   CSN: [id number] Arrival date & time: 07/14/20  1744     History Chief Complaint  Patient presents with   Failure To Thrive   Fatigue    Sheila Andrade is a 85 y.o. female.  The history is provided by the patient and medical records. No language interpreter was used.  Illness Location:  Generalized fatigue and weakness, worsening over the last few days. Severity:  Severe Onset quality:  Gradual Duration:  3 days Timing:  Constant Progression:  Worsening Chronicity:  New Associated symptoms: fatigue   Associated symptoms: no abdominal pain, no chest pain, no congestion, no cough, no diarrhea, no fever, no headaches, no loss of consciousness, no myalgias, no nausea, no rash, no rhinorrhea, no shortness of breath, no vomiting and no wheezing   Fatigue:    Severity:  Severe   Timing:  Constant   Progression:  Unchanged     Past Medical History:  Diagnosis Date   Hot flashes 06/06/2020   Hypertension     Patient Active Problem List   Diagnosis Date Noted   Hot flashes 06/06/2020   Fever 05/27/2020   Weight loss 05/27/2020   Tinea corporis 05/27/2020   Anemia 05/27/2020    History reviewed. No pertinent surgical history.   OB History   No obstetric history on file.     No family history on file.  Social History   Tobacco Use   Smoking status: Never   Smokeless tobacco: Never  Substance Use Topics   Drug use: Never    Home Medications Prior to Admission medications   Medication Sig Start Date End Date Taking? Authorizing Provider  acetaminophen (TYLENOL) 325 MG tablet Take by mouth. 03/15/20   [provider]  alendronate (FOSAMAX) 10 MG tablet Take by mouth.    [provider]  bimatoprost (LUMIGAN) 0.01 % SOLN Place 1 drop into the left eye at bedtime. 06/19/13   [provider]  brinzolamide (AZOPT) 1 % ophthalmic suspension Place 1 drop into the left eye 2 (two)  times daily. 01/04/19   [provider]  Cholecalciferol (VITAMIN D3) 10 MCG (400 UNIT) tablet Take by mouth.    [provider]  melatonin 3 MG TABS tablet Take 3 mg by mouth at bedtime.    [provider]  mirtazapine (REMERON) 7.5 MG tablet TAKE 1 TABLET BY MOUTH EVERY DAY NIGHTLY 05/01/20   [provider]  Multiple Vitamins-Minerals (MULTIVITAMIN WITH MINERALS) tablet Take 2 tablets by mouth daily.    [provider]  Nirmatrelvir & Ritonavir (PAXLOVID) 20 x 150 MG & 10 x  TBPK Take 3 tablets by mouth 2 (two) times daily. 05/31/20   [name], [name] L, NP-C  nystatin (MYCOSTATIN/NYSTOP) powder Apply 1 application topically 3 (three) times daily. 05/27/20   Tysinger, Kermit Balo, PA-C  olmesartan (BENICAR) 40 MG tablet Take 40 mg by mouth daily. 11/02/17   [provider]  omeprazole (PRILOSEC) 40 MG capsule Take by mouth. 05/10/20   [provider]  sertraline (ZOLOFT) 50 MG tablet Take by mouth. 05/01/20   [provider]  simvastatin (ZOCOR) 20 MG tablet Take 1 tablet by mouth daily. 05/10/20   [provider]  sulfamethoxazole-trimethoprim (BACTRIM DS) 800-160 MG tablet Take 1 tablet by mouth 2 (two) times daily. 05/29/20   Tysinger, Kermit Balo, PA-C  tamsulosin [facility]) 0.4 MG CAPS capsule Take by mouth. 04/30/20 04/30/21  [provider]    Allergies  Patient has no known allergies.  Review of Systems   Review of Systems  Constitutional:  Positive for appetite change, fatigue and unexpected weight change. Negative for chills, diaphoresis and fever.  HENT:  Negative for congestion and rhinorrhea.   Respiratory:  Negative for cough, chest tightness, shortness of breath and wheezing.   Cardiovascular:  Negative for chest pain, palpitations and leg swelling.  Gastrointestinal:  Negative for abdominal pain, constipation, diarrhea, nausea and vomiting.  Genitourinary:  Negative for dysuria and frequency.   Musculoskeletal:  Positive for gait problem. Negative for back pain, myalgias and neck pain.  Skin:  Negative for rash and wound.  Neurological:  Positive for weakness (generalized). Negative for loss of consciousness, light-headedness and headaches.  Psychiatric/Behavioral:  Positive for suicidal ideas. Negative for agitation and confusion. The patient is nervous/anxious (per family at home).   All other systems reviewed and are negative.  Physical Exam Updated Vital Signs BP (!) 171/97 (BP Location: Right Arm)   Pulse 85   Temp 98 F (36.7 C) (Oral)   Resp 18   Ht  (1.702 m)   Wt 51.3 kg   SpO2 99%   BMI 17.70 kg/m   Physical Exam Vitals and nursing note reviewed.  Constitutional:      General: She is not in acute distress.    Appearance: She is well-developed. She is not ill-appearing, toxic-appearing or diaphoretic.  HENT:     Head: Normocephalic and atraumatic.     Nose: Nose normal. No congestion or rhinorrhea.     Mouth/Throat:     Mouth: Mucous membranes are dry.  Eyes:     Extraocular Movements: Extraocular movements intact.     Conjunctiva/sclera: Conjunctivae normal.     Pupils: Pupils are equal, round, and reactive to light.  Cardiovascular:     Rate and Rhythm: Normal rate and regular rhythm.     Pulses: Normal pulses.     Heart sounds: No murmur heard. Pulmonary:     Effort: Pulmonary effort is normal. No respiratory distress.     Breath sounds: Normal breath sounds. No wheezing, rhonchi or rales.  Chest:     Chest wall: No tenderness.  Abdominal:     Palpations: Abdomen is soft.     Tenderness: There is no abdominal tenderness. There is no right CVA tenderness, left CVA tenderness, guarding or rebound.  Musculoskeletal:        General: Tenderness (l hip) present. No swelling.     Cervical back: Neck supple. No tenderness.     Right lower leg: No edema.     Left lower leg: No edema.  Skin:    General: Skin is warm and dry.     Capillary Refill:  Capillary refill takes less than 2 seconds.     Findings: No erythema or rash.  Neurological:     General: No focal deficit present.     Mental Status: She is alert.     Sensory: No sensory deficit.     Motor: No weakness.  Psychiatric:        Mood and Affect: Mood is depressed.        Thought Content: Thought content is not paranoid. Thought content includes suicidal ideation. Thought content does not include homicidal ideation. Thought content does not include homicidal or suicidal plan.    ED Results / Procedures / Treatments   Labs (all labs ordered are listed, but only abnormal results are displayed) Labs Reviewed  URINALYSIS, ROUTINE W REFLEX MICROSCOPIC -  Abnormal; Notable for the following components:      Result Value   APPearance CLOUDY (*)    Nitrite POSITIVE (*)    Bacteria, UA FEW (*)    All other components within normal limits  COMPREHENSIVE METABOLIC PANEL - Abnormal; Notable for the following components:   Sodium 130 (*)    All other components within normal limits  RESP PANEL BY RT-PCR (FLU A&B, COVID) ARPGX2  URINE CULTURE  TSH  CBC WITH DIFFERENTIAL/PLATELET  LACTIC ACID, PLASMA  AMMONIA  LACTIC ACID, PLASMA  SODIUM, URINE, RANDOM  OSMOLALITY, URINE  CREATININE, URINE, RANDOM  OSMOLALITY  VITAMIN B12  FOLATE  IRON AND TIBC  FERRITIN  RETICULOCYTES  CK  MAGNESIUM  PHOSPHORUS    EKG EKG Interpretation  Date/Time:  Sunday July 14 2020 17:59:20 EDT Ventricular Rate:  84 PR Interval:  176 QRS Duration: 92 QT Interval:  365 QTC Calculation: 432 R Axis:   -39 Text Interpretation: Sinus rhythm Left axis deviation When compared to prior, more artifact. No STEMI Confirmed by Theda Belfast ([id number]) on 07/14/2020 7:52:09 PM  Radiology DG Chest 2 View  Result Date: 07/14/2020 CLINICAL DATA:  Failure to thrive, weight loss EXAM: CHEST - 2 VIEW COMPARISON:  Radiograph 05/31/2020 FINDINGS: Suspect a small calcified mediastinal lymph node seen on lateral  radiograph. Remaining cardiomediastinal contours are unremarkable. Bandlike opacity in the right lung base, likely scarring given similar to prior. No consolidation, features of edema, pneumothorax, or effusion. Degenerative changes are present in the imaged spine and shoulders. Evidence of calcific tendinosis in the left shoulder. Left cervical carotid atherosclerosis. IMPRESSION: Suspect a small calcified mediastinal/hilar node. Can reflect sequela of prior granulomatous disease. No acute cardiopulmonary abnormality. Electronically Signed   By: Kreg Shropshire M.D.   On: 07/14/2020 20:56   MR BRAIN WO CONTRAST  Result Date: 07/14/2020 CLINICAL DATA:  Mental status change. EXAM: MRI HEAD WITHOUT CONTRAST TECHNIQUE: Multiplanar, multiecho pulse sequences of the brain and surrounding structures were obtained without intravenous contrast. COMPARISON:  None. FINDINGS: Brain: No acute infarction, acute hemorrhage, hydrocephalus, extra-axial collection or mass lesion. Moderate scattered T2/FLAIR hyperintensities within the white matter, which are nonspecific but most likely related to chronic microvascular ischemic disease given patient age. Mild-to-moderate atrophy with ex vacuo ventricular dilation. The callosal angle is not acute and there is no crowding of sulci at the vertex. Dilated perivascular space in the inferior left basal ganglia. Small focus of susceptibility artifact in the lateral left cerebellum, most likely a prior microhemorrhage. Vascular: Major arterial flow voids are maintained skull base. Skull and upper cervical spine: Normal marrow signal. Sinuses/Orbits: Visualized sinuses are clear. No acute orbital findings. Other: Small left mastoid effusion. IMPRESSION: 1. No evidence of acute intracranial abnormality 2. Moderate chronic microvascular ischemic disease and mild-to-moderate atrophy. 3. Small left mastoid effusion. Electronically Signed   By: Feliberto Harts MD   On: 07/14/2020 20:11   DG  Hip Unilat W or Wo Pelvis 2-3 Views Left  Result Date: 07/14/2020 CLINICAL DATA:  Failure to thrive and weight loss EXAM: DG HIP (WITH OR WITHOUT PELVIS) 2-3V LEFT COMPARISON:  01/01/2020 FINDINGS: Pelvic ring is intact. Old healed pubic rami fractures are noted on the left stable from prior CT examination. Degenerative changes of the hip joints are noted right greater than left. No acute fracture or dislocation in the left hip is noted. Some widening of the left sacroiliac joint is noted which appears more prominent than that seen on prior CT. IMPRESSION: Slight widening  of the left sacroiliac joint which may be projectional in nature. Degenerative changes of the hip joints bilaterally right greater than left. No acute fracture is seen. Old fractures in the left pubic rami are seen. Electronically Signed   By: Alcide Clever M.D.   On: 07/14/2020 20:56    Procedures Procedures   Medications Ordered in ED Medications  traZODone (DESYREL) tablet 25 mg (25 mg Oral Given 07/14/20 2132)  cefTRIAXone (ROCEPHIN) 1 g in sodium chloride 0.9 % 100 mL IVPB (1 g Intravenous New Bag/Given 07/15/20 0010)  cefTRIAXone (ROCEPHIN) 1 g in sodium chloride 0.9 % 100 mL IVPB (has no administration in time range)  sodium chloride 0.9 % bolus 500 mL (500 mLs Intravenous New Bag/Given 07/14/20 2026)  sodium chloride 0.9 % bolus 500 mL (500 mLs Intravenous New Bag/Given 07/15/20 0007)    ED Course  I have reviewed the triage vital signs and the nursing notes.  Pertinent labs & imaging results that were available during my care of the patient were reviewed by me and considered in my medical decision making (see chart for details).    MDM Rules/Calculators/A&P                          Mahlani Berninger is a 85 y.o. female with a past medical history significant for hypertension, left pelvis fracture 7 months ago, and chronic anemia presents with rapidly worsening mobility, generalized weakness, fatigue, depression with no  suicidal ideation, weight loss, decreased appetite, intermittent dysuria and some persistent left pelvis pain.  Patient is coming by daughter who is a Development worker, community in town.  Patient reportedly lives at home with them and has been doing PT and OT for the last few months with her left pelvis fracture from a fall back in January.  She had been doing relatively well until the last month or 2 when things started to worsen.  Then over the last 4 days, patient has had near complete change in her mobility and is now not able to even get up.  Daughter reports that the patient has not been eating or drinking, has been feeling too weak to do her PT/OT, has been losing weight with a 4 pound weight loss over the last few days, is getting more depressed and is now complaining of suicidal thoughts which she has never had in the past.  She reports he does not have a plan at this time because she has no access to any means to kill herself.  She has been having intermittent episodes of panic and anxiety and has profound fatigue.  Daughter is very concerned about worsening failure to thrive.  Of note, patient recently had an EGD within the last 2 weeks with some eating difficulties but otherwise did not show any concerning findings reportedly per daughter.  She also says she has had some dysuria on and off.  On exam, patient is weak appearing.  She was able to move all extremities and abnormal sensation and finger-nose-finger testing bilaterally.  She was able to raise both legs symmetrically but overall felt weak.  Abdomen was nontender, lungs clear.  No rashes seen.  Left hip was slightly tender to palpation.  Patient is reporting suicidal thoughts but is not a plan at this time.  Clinical I am concerned about failure to thrive, weight loss, and likely some dehydration.  Her mucous membranes are dry, will give some fluids.  We will check urinalysis, labs, and due to  the report that the patient "forgot how to walk" over the last few  days, will get MRI of her brain as well.  Due to her inability ambulate and rapidly worsening amatory condition, I do anticipate she will need either admission or placement depending on what is discovered on her work-up.  Daughter agrees with plan of care.  11:10 PM Work-up returned showing nitrites and bacteria, suspect UTI leading to some of her mental status changes and worsening fatigue.  We will give the patient more fluids as she starts feeling better after the 500 initially.  We will give antibiotics and admit for further management.  Anticipate she may need psychiatric evaluation if she has persistent or worsening symptoms during her admission.  Patient may also need placement at some point due to if her ambulatory status is not improved after UTI treatment   Final Clinical Impression(s) / ED Diagnoses Final diagnoses:  Fatigue, unspecified type  Lower urinary tract infectious disease  Suicidal ideation   Clinical Impression: 1. Fatigue, unspecified type   2. Lower urinary tract infectious disease   3. Suicidal ideation     Disposition: Admit  This note was prepared with assistance of Dragon voice recognition software. Occasional wrong-word or sound-a-like substitutions may have occurred due to the inherent limitations of voice recognition software.     Memori Sammon, Canary Brim, MD 07/15/20 0021

## 2020-07-14 NOTE — BH Assessment (Signed)
Per Wendie Simmer, pt is getting ready to be admitted medically. She has a UTI. TTS Consult not needed. She will request that the TTS Consult be discontinued or removed.    Zyion Doxtater T. Jimmye Norman, MS, [facility], [facility] Triage Specialist [facility]

## 2020-07-14 NOTE — ED Notes (Signed)
Patient transported to MRI 

## 2020-07-14 NOTE — ED Triage Notes (Signed)
Patient states that this not being able to stand, weakness, loss of balance since today, and not being able to get out of bed, this all started in January since she had a pelvic fracture. States she has lost 4 lbs over the past week, has depression and FTT. Weaker and weaker all week per daughter.

## 2020-07-14 NOTE — ED Notes (Signed)
ED TO INPATIENT HANDOFF REPORT  Name/Age/Gender Sheila JarvisErika Andrade 85 y.o. female  Code Status   Home/SNF/Other Home  Chief Complaint UTI (urinary tract infection) [N39.0]  Level of Care/Admitting Diagnosis ED Disposition    ED Disposition  Admit   Condition  --   Comment  [facility] [[id number]]  Level of Care: Med-Surg [16]  [facility] patient [facility] if equivalent level of care is available:: No  Covid Evaluation: Asymptomatic Screening Protocol (No Symptoms)  Diagnosis: UTI (urinary tract infection) [[id number]]) [[id number]]  Admitting Physician: Therisa DoyneUTOVA, ANASTASSIA [[id number]]  Attending Physician: Therisa DoyneUTOVA, ANASTASSIA [[id number]]         Medical History Past Medical History:  Diagnosis Date  . Hot flashes 06/06/2020  . Hypertension     Allergies No Known Allergies  IV Location/Drains/Wounds Patient Lines/Drains/Airways Status    Active Line/Drains/Airways    Name Placement date Placement time Site Days   Peripheral IV 07/14/20 20 G Anterior;Distal;Left Forearm 07/14/20  2006  Forearm  less than 1          Labs/Imaging Results for orders placed or performed during the hospital encounter of 07/14/20 (from the past 48 hour(s))  Urinalysis, Routine w reflex microscopic     Status: Abnormal   Collection Time: 07/14/20  6:49 PM  Result Value Ref Range   Color, Urine YELLOW YELLOW   APPearance CLOUDY (A) CLEAR   Specific Gravity, Urine 1.014 1.005 - 1.030   pH 7.0 5.0 - 8.0   Glucose, UA NEGATIVE NEGATIVE mg/dL   Hgb urine dipstick NEGATIVE NEGATIVE   Bilirubin Urine NEGATIVE NEGATIVE   Ketones, ur NEGATIVE NEGATIVE mg/dL   Protein, ur NEGATIVE NEGATIVE mg/dL   Nitrite POSITIVE (A) NEGATIVE   Leukocytes,Ua NEGATIVE NEGATIVE   RBC / HPF 0-5 0 - 5 RBC/hpf   WBC, UA 0-5 0 - 5 WBC/hpf   Bacteria, UA FEW (A) NONE SEEN   Squamous Epithelial / LPF 0-5 0 - 5   Budding Yeast PRESENT    Amorphous Crystal PRESENT      Comment: Performed at [facility], [address] [place], [place] [address]  TSH     Status: None   Collection Time: 07/14/20  8:25 PM  Result Value Ref Range   TSH 1.430 0.350 - 4.500 uIU/mL    Comment: Performed by a 3rd Generation assay with a functional sensitivity of <=0.01 uIU/mL. Performed at [facility], [address] [place], [place] [address]   CBC with Differential     Status: None   Collection Time: 07/14/20  8:25 PM  Result Value Ref Range   WBC 7.5 4.0 - 10.5 K/uL   RBC 4.37 3.87 - 5.11 MIL/uL   Hemoglobin 12.4 12.0 - 15.0 g/dL   HCT 14.739.1 82.936.0 - 56.246.0 %   MCV 89.5 80.0 - 100.0 fL   MCH 28.4 26.0 - 34.0 pg   MCHC 31.7 30.0 - 36.0 g/dL   RDW 13.014.3 86.511.5 - 78.415.5 %   Platelets 349 150 - 400 K/uL   nRBC 0.0 0.0 - 0.2 %   Neutrophils Relative % 63 %   Neutro Abs 4.8 1.7 - 7.7 K/uL   Lymphocytes Relative 26 %   Lymphs Abs 1.9 0.7 - 4.0 K/uL   Monocytes Relative 9 %   Monocytes Absolute 0.6 0.1 - 1.0 K/uL   Eosinophils Relative 1 %   Eosinophils Absolute 0.1 0.0 - 0.5 K/uL   Basophils  Relative 1 %   Basophils Absolute 0.0 0.0 - 0.1 K/uL   Immature Granulocytes 0 %   Abs Immature Granulocytes 0.02 0.00 - 0.07 K/uL    Comment: Performed at [facility], [address], [place], [place] [address]  Comprehensive metabolic panel     Status: Abnormal   Collection Time: 07/14/20  8:25 PM  Result Value Ref Range   Sodium 130 (L) 135 - 145 mmol/L   Potassium 4.2 3.5 - 5.1 mmol/L   Chloride 98 98 - 111 mmol/L   CO2 23 22 - 32 mmol/L   Glucose, Bld 97 70 - 99 mg/dL    Comment: Glucose reference range applies only to samples taken after fasting for at least 8 hours.   BUN 16 8 - 23 mg/dL   Creatinine, Ser 8.00 0.44 - 1.00 mg/dL   Calcium 9.4 8.9 - 34.9 mg/dL   Total Protein 7.2 6.5 - 8.1 g/dL   Albumin 4.0 3.5 - 5.0 g/dL   AST 20 15 - 41 U/L   ALT 15 0 - 44 U/L   Alkaline Phosphatase 59 38 - 126 U/L   Total  Bilirubin 0.6 0.3 - 1.2 mg/dL   GFR, Estimated >17 >91 mL/min    Comment: (NOTE) Calculated using the CKD-EPI Creatinine Equation (2021)    Anion gap 9 5 - 15    Comment: Performed at [facility], [address] [place], [place] [address]  Lactic acid, plasma     Status: None   Collection Time: 07/14/20  8:25 PM  Result Value Ref Range   Lactic Acid, Venous 1.1 0.5 - 1.9 mmol/L    Comment: Performed at [facility], [address] [place], [place] [address]  Resp Panel by RT-PCR (Flu A&B, Covid) Nasopharyngeal Swab     Status: None   Collection Time: 07/14/20  8:25 PM   Specimen: Nasopharyngeal Swab; Nasopharyngeal(NP) swabs in vial transport medium  Result Value Ref Range   SARS Coronavirus 2 by RT PCR NEGATIVE NEGATIVE    Comment: (NOTE) SARS-CoV-2 target nucleic acids are NOT DETECTED.  The SARS-CoV-2 RNA is generally detectable in upper respiratory specimens during the acute phase of infection. The lowest concentration of SARS-CoV-2 viral copies this assay can detect is 138 copies/mL. A negative result does not preclude SARS-Cov-2 infection and should not be used as the sole basis for treatment or other patient management decisions. A negative result may occur with  improper specimen collection/handling, submission of specimen other than nasopharyngeal swab, presence of viral mutation(s) within the areas targeted by this assay, and inadequate number of viral copies(<138 copies/mL). A negative result must be combined with clinical observations, patient history, and epidemiological information. The expected result is Negative.  Fact Sheet for Patients:  BloggerCourse.com  Fact Sheet for Healthcare Providers:  SeriousBroker.it  This test is no t yet approved or cleared by the [place] FDA and  has been authorized for detection and/or diagnosis of SARS-CoV-2 by FDA under an  Emergency Use Authorization (EUA). This EUA will remain  in effect (meaning this test can be used) for the duration of the COVID-19 declaration under Section 564(b)(1) of the Act, 21 U.S.C.section 360bbb-3(b)(1), unless the authorization is terminated  or revoked sooner.       Influenza A by PCR NEGATIVE NEGATIVE   Influenza B by PCR NEGATIVE NEGATIVE    Comment: (NOTE) The Xpert Xpress SARS-CoV-2/FLU/RSV plus assay is intended as an aid in the diagnosis of  influenza from Nasopharyngeal swab specimens and should not be used as a sole basis for treatment. Nasal washings and aspirates are unacceptable for Xpert Xpress SARS-CoV-2/FLU/RSV testing.  Fact Sheet for Patients: BloggerCourse.com  Fact Sheet for Healthcare Providers: SeriousBroker.it  This test is not yet approved or cleared by the [place] FDA and has been authorized for detection and/or diagnosis of SARS-CoV-2 by FDA under an Emergency Use Authorization (EUA). This EUA will remain in effect (meaning this test can be used) for the duration of the COVID-19 declaration under Section 564(b)(1) of the Act, 21 U.S.C. section 360bbb-3(b)(1), unless the authorization is terminated or revoked.  Performed at [facility], [address], [place], [place] [address]   Ammonia     Status: None   Collection Time: 07/14/20  8:25 PM  Result Value Ref Range   Ammonia 28 9 - 35 umol/L    Comment: Performed at [facility], [address], [place], [place] [address]   DG Chest 2 View  Result Date: 07/14/2020 CLINICAL DATA:  Failure to thrive, weight loss EXAM: CHEST - 2 VIEW COMPARISON:  Radiograph 05/31/2020 FINDINGS: Suspect a small calcified mediastinal lymph node seen on lateral radiograph. Remaining cardiomediastinal contours are unremarkable. Bandlike opacity in the right lung base, likely scarring given similar to prior. No  consolidation, features of edema, pneumothorax, or effusion. Degenerative changes are present in the imaged spine and shoulders. Evidence of calcific tendinosis in the left shoulder. Left cervical carotid atherosclerosis. IMPRESSION: Suspect a small calcified mediastinal/hilar node. Can reflect sequela of prior granulomatous disease. No acute cardiopulmonary abnormality. Electronically Signed   By: Kreg Shropshire M.D.   On: 07/14/2020 20:56   MR BRAIN WO CONTRAST  Result Date: 07/14/2020 CLINICAL DATA:  Mental status change. EXAM: MRI HEAD WITHOUT CONTRAST TECHNIQUE: Multiplanar, multiecho pulse sequences of the brain and surrounding structures were obtained without intravenous contrast. COMPARISON:  None. FINDINGS: Brain: No acute infarction, acute hemorrhage, hydrocephalus, extra-axial collection or mass lesion. Moderate scattered T2/FLAIR hyperintensities within the white matter, which are nonspecific but most likely related to chronic microvascular ischemic disease given patient age. Mild-to-moderate atrophy with ex vacuo ventricular dilation. The callosal angle is not acute and there is no crowding of sulci at the vertex. Dilated perivascular space in the inferior left basal ganglia. Small focus of susceptibility artifact in the lateral left cerebellum, most likely a prior microhemorrhage. Vascular: Major arterial flow voids are maintained skull base. Skull and upper cervical spine: Normal marrow signal. Sinuses/Orbits: Visualized sinuses are clear. No acute orbital findings. Other: Small left mastoid effusion. IMPRESSION: 1. No evidence of acute intracranial abnormality 2. Moderate chronic microvascular ischemic disease and mild-to-moderate atrophy. 3. Small left mastoid effusion. Electronically Signed   By: Feliberto Harts MD   On: 07/14/2020 20:11   DG Hip Unilat W or Wo Pelvis 2-3 Views Left  Result Date: 07/14/2020 CLINICAL DATA:  Failure to thrive and weight loss EXAM: DG HIP (WITH OR WITHOUT  PELVIS) 2-3V LEFT COMPARISON:  01/01/2020 FINDINGS: Pelvic ring is intact. Old healed pubic rami fractures are noted on the left stable from prior CT examination. Degenerative changes of the hip joints are noted right greater than left. No acute fracture or dislocation in the left hip is noted. Some widening of the left sacroiliac joint is noted which appears more prominent than that seen on prior CT. IMPRESSION: Slight widening of the left sacroiliac joint which may be projectional in nature. Degenerative changes of the hip joints bilaterally  right greater than left. No acute fracture is seen. Old fractures in the left pubic rami are seen. Electronically Signed   By: Alcide Clever M.D.   On: 07/14/2020 20:56    Pending Labs Unresulted Labs (From admission, onward)    Start     Ordered   07/14/20 2356  Sodium, urine, random  Once,   STAT        07/14/20 2355   07/14/20 2356  Osmolality, urine  Once,   STAT        07/14/20 2355   07/14/20 2356  Creatinine, urine, random  Once,   STAT        07/14/20 2355   07/14/20 2356  Osmolality  Add-on,   AD        07/14/20 2355   07/14/20 1849  Urine culture  ONCE - STAT,   STAT        07/14/20 1855   07/14/20 1849  Lactic acid, plasma  Now then every 2 hours,   STAT      07/14/20 1855   Signed and Held  Magnesium  Tomorrow morning,   R        Signed and Held   Signed and Held  Phosphorus  Tomorrow morning,   R        Signed and Held   Signed and Held  CBC WITH DIFFERENTIAL  Tomorrow morning,   R        Signed and Held   Signed and Held  TSH  Tomorrow morning,   R        Signed and Held   Signed and Held  Comprehensive metabolic panel  Tomorrow morning,   R        Signed and Held          Vitals/Pain Today's Vitals   07/14/20 2024 07/14/20 2132 07/14/20 2200 07/14/20 2300  BP: (!) 161/94 (!) 184/87 (!) 168/89 (!) 160/94  Pulse: 92 78 75 82  Resp: 18 18 14 18   Temp:      TempSrc:      SpO2: 98% 98% 97% 97%  Weight:      Height:       PainSc:        Isolation Precautions No active isolations  Medications Medications  traZODone (DESYREL) tablet 25 mg (25 mg Oral Given 07/14/20 2132)  cefTRIAXone (ROCEPHIN) 1 g in sodium chloride 0.9 % 100 mL IVPB (has no administration in time range)  sodium chloride 0.9 % bolus 500 mL (has no administration in time range)  sodium chloride 0.9 % bolus 500 mL (500 mLs Intravenous New Bag/Given 07/14/20 2026)    Mobility non-ambulatory

## 2020-07-14 NOTE — H&P (Signed)
Sheila Andrade ZOX:[id number] DOB: [birth year] DOA: 07/14/2020    PCP: Dalphine Handing, MD   Outpatient Specialists:    GI  Dr.Malkin, Stevphen Rochester, MD      Patient arrived to ER on 07/14/20 at 1744 Referred by Attending Tegeler, Canary Brim, *   Patient coming from: home Lives  With family     Chief Complaint:   Chief Complaint  Patient presents with   Failure To Thrive   Fatigue    HPI: Sheila Andrade is a 85 y.o. female with medical history significant of recurrent UTI, recent COVID, glaucoma osteoporosis, HLD, depression    Presented with  confusion not being able to stand, weakness, loss of balance Progressive since January when she had a pelvic fracture She lost 4 lbs over past week  States she does not want to live anymore Denies any particular plans says she does not have a means to end her life but if she came across any means she would do it   Has   been vaccinated against COVID  and boosted   Initial COVID TEST  NEGATIVE   Lab Results  Component Value Date   SARSCOV2NAA NEGATIVE 07/14/2020    Regarding pertinent Chronic problems:    Hyperlipidemia -  on statins  Zocor   Chronic anemia - baseline hg Hemoglobin & Hematocrit  Recent Labs    05/27/20 1118 05/31/20 2320 07/14/20 2025  HGB 12.6 10.2* 12.4    While in ER: Extensive work-up was done at home and ER evidence of urinary tract infection MRI brain unremarkable. Given suicidal ideation TTS consult was called.  Patient is being admitted for UTI deconditioning lesions   ED Triage Vitals  Enc Vitals Group     BP 07/14/20 1800 (!) 171/97     Pulse Rate 07/14/20 1800 85     Resp 07/14/20 1800 18     Temp 07/14/20 1800 98 F (36.7 C)     Temp Source 07/14/20 1800 Oral     SpO2 07/14/20 1800 99 %     Weight 07/14/20 1755 113 lb (51.3 kg)     Height 07/14/20 1755  (1.702 m)     Head Circumference --      Peak Flow --      Pain Score 07/14/20 1754 0     Pain Loc --      Pain Edu? --       Excl. in GC? --   TMAX(24)@     _________________________________________ Significant initial  Findings: Abnormal Labs Reviewed  URINALYSIS, ROUTINE W REFLEX MICROSCOPIC - Abnormal; Notable for the following components:      Result Value   APPearance CLOUDY (*)    Nitrite POSITIVE (*)    Bacteria, UA FEW (*)    All other components within normal limits  COMPREHENSIVE METABOLIC PANEL - Abnormal; Notable for the following components:   Sodium 130 (*)    All other components within normal limits   ____________________________________________ Ordered    CXR -small calcified mediastinal node Hip imaging no acute fracture old fractures noted   MRI brain nonacute small left mastoid effusion   ECG: Ordered Personally reviewed by me showing: HR : 84 Rhythm:  NSR   no evidence of ischemic changes QTC 432    The recent clinical data is shown below. Vitals:   07/14/20 2024 07/14/20 2132 07/14/20 2200 07/14/20 2300  BP: (!) 161/94 (!) 184/87 (!) 168/89 (!) 160/94  Pulse: 92 78 75 82  Resp: 18 18 14 18   Temp:      TempSrc:      SpO2: 98% 98% 97% 97%  Weight:      Height:        WBC     Component Value Date/Time   WBC 7.5 07/14/2020 2025   LYMPHSABS 1.9 07/14/2020 2025   LYMPHSABS 1.5 05/27/2020 1118   MONOABS 0.6 07/14/2020 2025   EOSABS 0.1 07/14/2020 2025   EOSABS 0.0 05/27/2020 1118   BASOSABS 0.0 07/14/2020 2025   BASOSABS 0.1 05/27/2020 1118    LActic Acid, Venous    Component Value Date/Time   LATICACIDVEN 1.1 07/14/2020 2025      UA   evidence of UTI      Urine analysis:    Component Value Date/Time   COLORURINE YELLOW 07/14/2020 1849   APPEARANCEUR CLOUDY (A) 07/14/2020 1849   LABSPEC 1.014 07/14/2020 1849   LABSPEC 1.020 05/27/2020 1541   PHURINE 7.0 07/14/2020 1849   GLUCOSEU NEGATIVE 07/14/2020 1849   HGBUR NEGATIVE 07/14/2020 1849   BILIRUBINUR NEGATIVE 07/14/2020 1849   BILIRUBINUR negative 05/27/2020 1541   KETONESUR NEGATIVE 07/14/2020  1849   PROTEINUR NEGATIVE 07/14/2020 1849   NITRITE POSITIVE (A) 07/14/2020 1849   LEUKOCYTESUR NEGATIVE 07/14/2020 1849    Results for orders placed or performed during the hospital encounter of 07/14/20  Resp Panel by RT-PCR (Flu A&B, Covid) Nasopharyngeal Swab     Status: None   Collection Time: 07/14/20  8:25 PM   Specimen: Nasopharyngeal Swab; Nasopharyngeal(NP) swabs in vial transport medium  Result Value Ref Range Status   SARS Coronavirus 2 by RT PCR NEGATIVE NEGATIVE Final         Influenza A by PCR NEGATIVE NEGATIVE Final   Influenza B by PCR NEGATIVE NEGATIVE Final           _______________________________________________ Hospitalist was called for admission for UTI deconditioning  The following Work up has been ordered so far:  Orders Placed This Encounter  Procedures   Urine culture   Resp Panel by RT-PCR (Flu A&B, Covid) Nasopharyngeal Swab   DG Chest 2 View   MR BRAIN WO CONTRAST   DG Hip Unilat W or Wo Pelvis 2-3 Views Left   Urinalysis, Routine w reflex microscopic   TSH   CBC with Differential   Comprehensive metabolic panel   Lactic acid, plasma   Ammonia   Cardiac monitoring   Consult to hospitalist   Consult to TTS (previously known as ACT)   ED EKG     Following Medications were ordered in ER: Medications  traZODone (DESYREL) tablet 25 mg (25 mg Oral Given 07/14/20 2132)  cefTRIAXone (ROCEPHIN) 1 g in sodium chloride 0.9 % 100 mL IVPB (has no administration in time range)  sodium chloride 0.9 % bolus 500 mL (has no administration in time range)  sodium chloride 0.9 % bolus 500 mL (500 mLs Intravenous New Bag/Given 07/14/20 2026)        Consult Orders  (From admission, onward)           Start     Ordered   07/14/20 2307  Consult to hospitalist  Once       Provider:  (Not yet assigned)  Question Answer Comment  Place call to: [facility]   Reason for Consult Admit      07/14/20 2307             OTHER Significant initial   Findings:  labs showing:  Recent Labs  Lab 07/14/20 2025  NA 130*  K 4.2  CO2 23  GLUCOSE 97  BUN 16  CREATININE 0.60  CALCIUM 9.4    Cr   stable,     Lab Results  Component Value Date   CREATININE 0.60 07/14/2020   CREATININE 0.85 05/31/2020   CREATININE 0.84 05/27/2020    Recent Labs  Lab 07/14/20 2025  AST 20  ALT 15  ALKPHOS 59  BILITOT 0.6  PROT 7.2  ALBUMIN 4.0   Lab Results  Component Value Date   CALCIUM 9.4 07/14/2020    Plt: Lab Results  Component Value Date   PLT 349 07/14/2020       Recent Labs  Lab 07/14/20 2025  WBC 7.5  NEUTROABS 4.8  HGB 12.4  HCT 39.1  MCV 89.5  PLT 349    HG/HCT   stable,      Component Value Date/Time   HGB 12.4 07/14/2020 2025   HGB 12.6 05/27/2020 1118   HCT 39.1 07/14/2020 2025   HCT 38.5 05/27/2020 1118   MCV 89.5 07/14/2020 2025   MCV 90 05/27/2020 1118      No results for input(s): LIPASE, AMYLASE in the last 168 hours. Recent Labs  Lab 07/14/20 2025  AMMONIA 28     Cardiac Panel (last 3 results) No results for input(s): CKTOTAL, CKMB, TROPONINI, RELINDX in the last 72 hours.   BNP (last 3 results) No results for input(s): BNP in the last 8760 hours.         Cultures: No results found for: SDES, SPECREQUEST, CULT, REPTSTATUS   Radiological Exams on Admission: DG Chest 2 View  Result Date: 07/14/2020 CLINICAL DATA:  Failure to thrive, weight loss EXAM: CHEST - 2 VIEW COMPARISON:  Radiograph 05/31/2020 FINDINGS: Suspect a small calcified mediastinal lymph node seen on lateral radiograph. Remaining cardiomediastinal contours are unremarkable. Bandlike opacity in the right lung base, likely scarring given similar to prior. No consolidation, features of edema, pneumothorax, or effusion. Degenerative changes are present in the imaged spine and shoulders. Evidence of calcific tendinosis in the left shoulder. Left cervical carotid atherosclerosis. IMPRESSION: Suspect a small calcified  mediastinal/hilar node. Can reflect sequela of prior granulomatous disease. No acute cardiopulmonary abnormality. Electronically Signed   By: Kreg Shropshire M.D.   On: 07/14/2020 20:56   MR BRAIN WO CONTRAST  Result Date: 07/14/2020 CLINICAL DATA:  Mental status change. EXAM: MRI HEAD WITHOUT CONTRAST TECHNIQUE: Multiplanar, multiecho pulse sequences of the brain and surrounding structures were obtained without intravenous contrast. COMPARISON:  None. FINDINGS: Brain: No acute infarction, acute hemorrhage, hydrocephalus, extra-axial collection or mass lesion. Moderate scattered T2/FLAIR hyperintensities within the white matter, which are nonspecific but most likely related to chronic microvascular ischemic disease given patient age. Mild-to-moderate atrophy with ex vacuo ventricular dilation. The callosal angle is not acute and there is no crowding of sulci at the vertex. Dilated perivascular space in the inferior left basal ganglia. Small focus of susceptibility artifact in the lateral left cerebellum, most likely a prior microhemorrhage. Vascular: Major arterial flow voids are maintained skull base. Skull and upper cervical spine: Normal marrow signal. Sinuses/Orbits: Visualized sinuses are clear. No acute orbital findings. Other: Small left mastoid effusion. IMPRESSION: 1. No evidence of acute intracranial abnormality 2. Moderate chronic microvascular ischemic disease and mild-to-moderate atrophy. 3. Small left mastoid effusion. Electronically Signed   By: Feliberto Harts MD   On: 07/14/2020 20:11   DG Hip Unilat W or Wo Pelvis 2-3 Views  Left  Result Date: 07/14/2020 CLINICAL DATA:  Failure to thrive and weight loss EXAM: DG HIP (WITH OR WITHOUT PELVIS) 2-3V LEFT COMPARISON:  01/01/2020 FINDINGS: Pelvic ring is intact. Old healed pubic rami fractures are noted on the left stable from prior CT examination. Degenerative changes of the hip joints are noted right greater than left. No acute fracture or  dislocation in the left hip is noted. Some widening of the left sacroiliac joint is noted which appears more prominent than that seen on prior CT. IMPRESSION: Slight widening of the left sacroiliac joint which may be projectional in nature. Degenerative changes of the hip joints bilaterally right greater than left. No acute fracture is seen. Old fractures in the left pubic rami are seen. Electronically Signed   By: Alcide Clever M.D.   On: 07/14/2020 20:56   _______________________________________________________________________________________________________ Latest  Blood pressure (!) 160/94, pulse 82, temperature 98 F (36.7 C), temperature source Oral, resp. rate 18, height  (1.702 m), weight 51.3 kg, SpO2 97 %.   Review of Systems:    Pertinent positives include:  chills, fatigue,  weight loss confusion  Constitutional:  No weight loss, night sweats, Fevers, HEENT:  No headaches, Difficulty swallowing,Tooth/dental problems,Sore throat,  No sneezing, itching, ear ache, nasal congestion, post nasal drip,  Cardio-vascular:  No chest pain, Orthopnea, PND, anasarca, dizziness, palpitations.no Bilateral lower extremity swelling  GI:  No heartburn, indigestion, abdominal pain, nausea, vomiting, diarrhea, change in bowel habits, loss of appetite, melena, blood in stool, hematemesis Resp:  no shortness of breath at rest. No dyspnea on exertion, No excess mucus, no productive cough, No non-productive cough, No coughing up of blood.No change in color of mucus.No wheezing. Skin:  no rash or lesions. No jaundice GU:  no dysuria, change in color of urine, no urgency or frequency. No straining to urinate.  No flank pain.  Musculoskeletal:  No joint pain or no joint swelling. No decreased range of motion. No back pain.  Psych:  No change in mood or affect. No depression or anxiety. No memory loss.  Neuro: no localizing neurological complaints, no tingling, no weakness, no double vision, no gait  abnormality, no slurred speech, no   All systems reviewed and apart from HOPI all are negative _______________________________________________________________________________________________ Past Medical History:   Past Medical History:  Diagnosis Date   Hot flashes 06/06/2020   Hypertension       History reviewed. No pertinent surgical history.  Social History:  Ambulatory   walker        reports that she has never smoked. She has never used smokeless tobacco. She reports that she does not use drugs. No history on file for alcohol use.    Family History:   Family History  Problem Relation Age of Onset   Hypertension Other    ______________________________________________________________________________________________ Allergies: No Known Allergies   Prior to Admission medications   Medication Sig Start Date End Date Taking? Authorizing Provider  acetaminophen (TYLENOL) 325 MG tablet Take by mouth. 03/15/20   [provider]  alendronate (FOSAMAX) 10 MG tablet Take by mouth.    [provider]  bimatoprost (LUMIGAN) 0.01 % SOLN Place 1 drop into the left eye at bedtime. 06/19/13   [provider]  brinzolamide (AZOPT) 1 % ophthalmic suspension Place 1 drop into the left eye 2 (two) times daily. 01/04/19   [provider]  Cholecalciferol (VITAMIN D3) 10 MCG (400 UNIT) tablet Take by mouth.    [provider]  melatonin  3 MG TABS tablet Take 3 mg by mouth at bedtime.    [provider]  mirtazapine (REMERON) 7.5 MG tablet TAKE 1 TABLET BY MOUTH EVERY DAY NIGHTLY 05/01/20   [provider]  Multiple Vitamins-Minerals (MULTIVITAMIN WITH MINERALS) tablet Take 2 tablets by mouth daily.    [provider]  Nirmatrelvir & Ritonavir (PAXLOVID) 20 x 150 MG & 10 x 100MG  TBPK Take 3 tablets by mouth 2 (two) times daily. 05/31/20   [name], [name] L, NP-C  nystatin (MYCOSTATIN/NYSTOP) powder Apply 1 application topically  3 (three) times daily. 05/27/20   Tysinger, 05/29/20, PA-C  olmesartan (BENICAR) 40 MG tablet Take 40 mg by mouth daily. 11/02/17   [provider]  omeprazole (PRILOSEC) 40 MG capsule Take by mouth. 05/10/20   [provider]  sertraline (ZOLOFT) 50 MG tablet Take by mouth. 05/01/20   [provider]  simvastatin (ZOCOR) 20 MG tablet Take 1 tablet by mouth daily. 05/10/20   [provider]  sulfamethoxazole-trimethoprim (BACTRIM DS) 800-160 MG tablet Take 1 tablet by mouth 2 (two) times daily. 05/29/20   Tysinger, 05/31/20, PA-C  tamsulosin [facility]) 0.4 MG CAPS capsule Take by mouth. 04/30/20 04/30/21  [provider]    ___________________________________________________________________________________________________ Physical Exam: Vitals with BMI 07/14/2020 07/14/2020 07/14/2020  Height - - -  Weight - - -  BMI - - -  Systolic 160 168 09/14/2020  Diastolic 94 89 87  Pulse 82 75 78     1. General:  in No  Acute distress   Chronically ill   -appearing 2. Psychological: Alert and  Oriented 3. Head/ENT:    Dry Mucous Membranes                          Head Non traumatic, neck supple                          Poor Dentition 4. SKIN:  decreased Skin turgor,  Skin clean Dry and intact no rash 5. Heart: Regular rate and rhythm no Murmur, no Rub or gallop 6. Lungs:   no wheezes or crackles   7. Abdomen: Soft, non-tender, Non distended  bowel sounds present 8. Lower extremities: no clubbing, cyanosis, no  edema 9. Neurologically Grossly intact, moving all 4 extremities equally  10. MSK: Normal range of motion    Chart has been reviewed  ______________________________________________________________________________________________  Assessment/Plan  85 y.o. female with medical history significant of recurrent UTI, recent COVID, glaucoma osteoporosis, HLD, depression    Admitted for deconditioning UTI and suicidal ideations  Present on Admission:  UTI  (urinary tract infection) -treated with Rocephin await results of urine cultures   Anemia -obtain anemia panel patient had recently undergone endoscopy.   Hyponatremia -obtain urine electrolytes likely in the setting of dehydration will continue to rehydrate and follow sodium levels. Suicidal ideations -admit on suicidal precautions order behavioral health consult Hyperlipidemia continue home medications  Depression continue home medications  Debility will have PT OT assess prior to discharge may need placement given recent pelvic fractures  Glaucoma continue home medications Other plan as per orders.  DVT prophylaxis:  SCD     Code Status:    Code Status: Not on file FULL CODE     Family Communication:   Family not at  Bedside    Disposition Plan:   likely will need placement for rehabilitation  Following barriers for discharge:                            Electrolytes corrected                                                                                        Will need to be able to tolerate PO                            Will likely need home health,                             Will need consultants to evaluate patient prior to discharge                     Would benefit from PT/OT eval prior to DC  Ordered                   Swallow eval - SLP ordered                                       Behavioral health  consulted                    Consults called: behavioral health  Admission status:  ED Disposition     ED Disposition  Admit   Condition  --   Comment  [id number]  Level of Care: Med-Surg [16]  [facility] equivalent level of care is available:: No  Covid Evaluation: Asymptomatic Screening Protocol (No Symptoms)  Diagnosis: UTI (urinary tract infection) [161096][[id number]]  Admitting Physician: Therisa DoyneUTOVA, Gordy Goar [[id number]]  Attending Physician:  Therisa DoyneUTOVA, Tyric Rodeheaver [[id number]]          Obs    Level of care     medical floor       Lab Results  Component Value Date   SARSCOV2NAA NEGATIVE 07/14/2020     Precautions: admitted as  Covid Negative     PPE: Used by the provider:   N95  eye Goggles,  Gloves       Chaunte Hornbeck 07/14/2020, 12:46 AM    [facility]     after 2 AM please page floor coverage PA If 7AM-7PM, please contact the day team taking care of the patient using [email or web address]   Patient was evaluated in the context of the global COVID-19 pandemic, which necessitated consideration that the patient might be at risk for infection with the SARS-CoV-2 virus that causes COVID-19. Institutional protocols and algorithms that pertain to the evaluation of patients at risk for COVID-19 are in a state of rapid change based on information released by regulatory bodies including the CDC and federal and state organizations. These policies and algorithms were followed during the patient's care.

## 2020-07-15 ENCOUNTER — Encounter (HOSPITAL_COMMUNITY): Payer: Self-pay | Admitting: Internal Medicine

## 2020-07-15 DIAGNOSIS — R634 Abnormal weight loss: Secondary | ICD-10-CM

## 2020-07-15 DIAGNOSIS — F332 Major depressive disorder, recurrent severe without psychotic features: Secondary | ICD-10-CM | POA: Diagnosis not present

## 2020-07-15 DIAGNOSIS — E871 Hypo-osmolality and hyponatremia: Secondary | ICD-10-CM | POA: Diagnosis not present

## 2020-07-15 DIAGNOSIS — R45851 Suicidal ideations: Secondary | ICD-10-CM | POA: Diagnosis not present

## 2020-07-15 LAB — RETICULOCYTES
Immature Retic Fract: 11.8 % (ref 2.3–15.9)
RBC.: 4.4 MIL/uL (ref 3.87–5.11)
Retic Count, Absolute: 44 10*3/uL (ref 19.0–186.0)
Retic Ct Pct: 1 % (ref 0.4–3.1)

## 2020-07-15 LAB — COMPREHENSIVE METABOLIC PANEL
ALT: 16 U/L (ref 0–44)
AST: 17 U/L (ref 15–41)
Albumin: 3.9 g/dL (ref 3.5–5.0)
Alkaline Phosphatase: 58 U/L (ref 38–126)
Anion gap: 7 (ref 5–15)
BUN: 12 mg/dL (ref 8–23)
CO2: 26 mmol/L (ref 22–32)
Calcium: 9.3 mg/dL (ref 8.9–10.3)
Chloride: 102 mmol/L (ref 98–111)
Creatinine, Ser: 0.56 mg/dL (ref 0.44–1.00)
GFR, Estimated: >60 mL/min (ref >60)
Glucose, Bld: 93 mg/dL (ref 70–99)
Potassium: 3.9 mmol/L (ref 3.5–5.1)
Sodium: 135 mmol/L (ref 135–145)
Total Bilirubin: 0.4 mg/dL (ref 0.3–1.2)
Total Protein: 6.9 g/dL (ref 6.5–8.1)

## 2020-07-15 LAB — IRON AND TIBC
Iron: 57 ug/dL (ref 28–170)
Saturation Ratios: 18 % (ref 10.4–31.8)
TIBC: 318 ug/dL (ref 250–450)
UIBC: 261 ug/dL

## 2020-07-15 LAB — CBC WITH DIFFERENTIAL/PLATELET
Abs Immature Granulocytes: 0.03 10*3/uL (ref 0.00–0.07)
Basophils Absolute: 0 10*3/uL (ref 0.0–0.1)
Basophils Relative: 0 %
Eosinophils Absolute: 0.1 10*3/uL (ref 0.0–0.5)
Eosinophils Relative: 1 %
HCT: 39.3 % (ref 36.0–46.0)
Hemoglobin: 12.6 g/dL (ref 12.0–15.0)
Immature Granulocytes: 0 %
Lymphocytes Relative: 20 %
Lymphs Abs: 1.5 10*3/uL (ref 0.7–4.0)
MCH: 28.3 pg (ref 26.0–34.0)
MCHC: 32.1 g/dL (ref 30.0–36.0)
MCV: 88.3 fL (ref 80.0–100.0)
Monocytes Absolute: 0.6 10*3/uL (ref 0.1–1.0)
Monocytes Relative: 8 %
Neutro Abs: 5.3 10*3/uL (ref 1.7–7.7)
Neutrophils Relative %: 71 %
Platelets: 319 10*3/uL (ref 150–400)
RBC: 4.45 MIL/uL (ref 3.87–5.11)
RDW: 14.2 % (ref 11.5–15.5)
WBC: 7.6 10*3/uL (ref 4.0–10.5)
nRBC: 0 % (ref 0.0–0.2)

## 2020-07-15 LAB — PHOSPHORUS
Phosphorus: 4 mg/dL (ref 2.5–4.6)
Phosphorus: 3.5 mg/dL (ref 2.5–4.6)

## 2020-07-15 LAB — OSMOLALITY: Osmolality: 276 mOsm/kg (ref 275–295)

## 2020-07-15 LAB — MAGNESIUM
Magnesium: 2.1 mg/dL (ref 1.7–2.4)
Magnesium: 2.1 mg/dL (ref 1.7–2.4)

## 2020-07-15 LAB — CK: Total CK: 59 U/L (ref 38–234)

## 2020-07-15 LAB — VITAMIN B12: Vitamin B-12: 226 pg/mL (ref 180–914)

## 2020-07-15 LAB — LACTIC ACID, PLASMA: Lactic Acid, Venous: 0.6 mmol/L (ref 0.5–1.9)

## 2020-07-15 LAB — FOLATE: Folate: 12.7 ng/mL (ref >5.9)

## 2020-07-15 LAB — FERRITIN: Ferritin: 156 ng/mL (ref 11–307)

## 2020-07-15 MED ORDER — SERTRALINE HCL 25 MG PO TABS
25.0000 mg | ORAL_TABLET | Freq: Every day | ORAL | Status: DC
  Filled 2020-07-15: qty 1

## 2020-07-15 MED ORDER — LATANOPROST 0.005 % OP SOLN
1.0000 [drp] | Freq: Every day | OPHTHALMIC | Status: DC
  Administered 2020-07-16 – 2020-07-31 (×15): 1 [drp] via OPHTHALMIC
  Filled 2020-07-15: qty 2.5

## 2020-07-15 MED ORDER — MIRTAZAPINE 15 MG PO TABS
7.5000 mg | ORAL_TABLET | Freq: Every day | ORAL | Status: DC
  Filled 2020-07-15: qty 1

## 2020-07-15 MED ORDER — TRAZODONE HCL 50 MG PO TABS: 25.0000 mg | ORAL_TABLET | Freq: Every evening | ORAL | Status: DC | PRN

## 2020-07-15 MED ORDER — SODIUM CHLORIDE 0.9 % IV SOLN
1.0000 g | INTRAVENOUS | Status: DC
  Filled 2020-07-15: qty 10

## 2020-07-15 MED ORDER — ARIPIPRAZOLE 5 MG PO TABS
5.0000 mg | ORAL_TABLET | Freq: Every day | ORAL | Status: DC
  Administered 2020-07-16: 5 mg via ORAL
  Filled 2020-07-15: qty 1

## 2020-07-15 MED ORDER — POLYETHYLENE GLYCOL 3350 17 G PO PACK
17.0000 g | PACK | Freq: Every day | ORAL | Status: DC | PRN
  Administered 2020-07-15 – 2020-07-29 (×7): 17 g via ORAL
  Filled 2020-07-15 (×7): qty 1

## 2020-07-15 MED ORDER — CYANOCOBALAMIN 1000 MCG/ML IJ SOLN
1000.0000 ug | Freq: Once | INTRAMUSCULAR | Status: AC
  Administered 2020-07-15: 1000 ug via SUBCUTANEOUS
  Filled 2020-07-15: qty 1

## 2020-07-15 MED ORDER — VENLAFAXINE HCL ER 75 MG PO CP24
75.0000 mg | ORAL_CAPSULE | Freq: Every morning | ORAL | Status: DC
  Administered 2020-07-16: 75 mg via ORAL
  Filled 2020-07-15: qty 1

## 2020-07-15 MED ORDER — ACETAMINOPHEN 325 MG PO TABS
650.0000 mg | ORAL_TABLET | Freq: Four times a day (QID) | ORAL | Status: DC | PRN
  Administered 2020-07-17 – 2020-07-30 (×3): 650 mg via ORAL
  Filled 2020-07-15 (×4): qty 2

## 2020-07-15 MED ORDER — BRINZOLAMIDE 1 % OP SUSP
1.0000 [drp] | Freq: Two times a day (BID) | OPHTHALMIC | Status: DC
  Administered 2020-07-15 – 2020-08-02 (×37): 1 [drp] via OPHTHALMIC
  Filled 2020-07-15: qty 10

## 2020-07-15 MED ORDER — HYDROCODONE-ACETAMINOPHEN 5-325 MG PO TABS: 1.0000 | ORAL_TABLET | ORAL | Status: DC | PRN

## 2020-07-15 MED ORDER — ENSURE ENLIVE PO LIQD
237.0000 mL | Freq: Two times a day (BID) | ORAL | Status: DC
  Administered 2020-07-15 – 2020-07-30 (×26): 237 mL via ORAL
  Filled 2020-07-15: qty 237

## 2020-07-15 MED ORDER — ACETAMINOPHEN 650 MG RE SUPP: 650.0000 mg | Freq: Four times a day (QID) | RECTAL | Status: DC | PRN

## 2020-07-15 MED ORDER — SIMVASTATIN 20 MG PO TABS
20.0000 mg | ORAL_TABLET | Freq: Every day | ORAL | Status: DC
  Administered 2020-07-15 – 2020-08-02 (×19): 20 mg via ORAL
  Filled 2020-07-15: qty 1
  Filled 2020-07-15: qty 2
  Filled 2020-07-15: qty 1
  Filled 2020-07-15 (×2): qty 2
  Filled 2020-07-15: qty 1
  Filled 2020-07-15: qty 2
  Filled 2020-07-15 (×5): qty 1
  Filled 2020-07-15: qty 2
  Filled 2020-07-15 (×5): qty 1
  Filled 2020-07-15: qty 2

## 2020-07-15 MED ORDER — SODIUM CHLORIDE 0.9 % IV SOLN
75.0000 mL/h | INTRAVENOUS | Status: AC
  Administered 2020-07-15: 75 mL/h via INTRAVENOUS

## 2020-07-15 MED ORDER — ARIPIPRAZOLE 2 MG PO TABS
2.0000 mg | ORAL_TABLET | Freq: Once | ORAL | Status: AC
  Administered 2020-07-15: 2 mg via ORAL
  Filled 2020-07-15: qty 1

## 2020-07-15 MED ORDER — VENLAFAXINE HCL ER 37.5 MG PO CP24
37.5000 mg | ORAL_CAPSULE | Freq: Every morning | ORAL | Status: DC
  Administered 2020-07-15: 37.5 mg via ORAL
  Filled 2020-07-15: qty 1

## 2020-07-15 MED ORDER — TAMSULOSIN HCL 0.4 MG PO CAPS: 0.4000 mg | ORAL_CAPSULE | Freq: Every day | ORAL | Status: DC

## 2020-07-15 NOTE — Progress Notes (Signed)
Cardiac monitoring order received. Pt not in acute distress, [name], daughter in law at bedside updated. Sitter at bedside. Pt to transfer to 1501 with belongings, report given to Dahlia Client, Charity fundraiser.

## 2020-07-15 NOTE — ED Notes (Signed)
Asked ED nurse via secure chat to D/C telemetry before bringing her up to the unit.

## 2020-07-15 NOTE — Evaluation (Signed)
Physical Therapy Evaluation Patient Details Name: Sheila Andrade MRN: [id number] DOB: [birth year] Today's Date: 07/15/2020   History of Present Illness  85 y.o. female adm with recurrent UTI PMH:  recent COVID, glaucoma osteoporosis, HLD, depression . Pysch consulted  Clinical Impression  Pt admitted with above diagnosis.  PT requiring overall mod assist for mobility, decr motivation to mobilize or work with PT, fearful of falling but agreeable with encouragement. Recommend SNF post acute  PT and nursing staff encouraged OOB in chair however pt not agreeable to stay up >30 minutes  Pt currently with functional limitations due to the deficits listed below (see PT Problem List). Pt will benefit from skilled PT to increase their independence and safety with mobility to allow discharge to the venue listed below.       Follow Up Recommendations SNF    Equipment Recommendations  None recommended by PT    Recommendations for Other Services       Precautions / Restrictions Precautions Precautions: Fall Restrictions Weight Bearing Restrictions: No      Mobility  Bed Mobility Overal bed mobility: Needs Assistance Bed Mobility: Supine to Sit     Supine to sit: Min assist;HOB elevated     General bed mobility comments: incr time, assist to elevate trunk, cues for sequence and task completion    Transfers Overall transfer level: Needs assistance Equipment used: Rolling walker (2 wheeled) Transfers: Sit to/from Stand Sit to Stand: Mod assist         General transfer comment: assist for anterior-superior wt shift. cues for hand placement and self assist  Ambulation/Gait Ambulation/Gait assistance: Mod assist Gait Distance (Feet): 6 Feet Assistive device: Rolling walker (2 wheeled) Gait Pattern/deviations: Step-through pattern;Decreased stride length;Shuffle;Narrow base of support     General Gait Details: assist with balance and wt shift, cues for step length  Stairs             Wheelchair Mobility    Modified Rankin (Stroke Patients Only)       Balance Overall balance assessment: Needs assistance Sitting-balance support: Feet supported;Bilateral upper extremity supported Sitting balance-Leahy Scale: Fair       Standing balance-Leahy Scale: Poor Standing balance comment: reliant on UEs and external assist                             Pertinent Vitals/Pain Pain Assessment: No/denies pain    Home Living Family/patient expects to be discharged to:: Skilled nursing facility Living Arrangements: Children               Additional Comments: has been living with her dtr since pelvic fx ~ 8-9 mos ago    Prior Function Level of Independence: Needs assistance   Gait / Transfers Assistance Needed: pt reports amb with RW           Hand Dominance        Extremity/Trunk Assessment   Upper Extremity Assessment Upper Extremity Assessment: Generalized weakness;Defer to OT evaluation    Lower Extremity Assessment Lower Extremity Assessment: Generalized weakness       Communication   Communication: No difficulties  Cognition Arousal/Alertness: Awake/alert Behavior During Therapy: Flat affect Overall Cognitive Status: No family/caregiver present to determine baseline cognitive functioning Area of Impairment: Following commands                       Following Commands: Follows one step commands with increased time;Follows multi-step commands with increased  time              General Comments      Exercises     Assessment/Plan    PT Assessment Patient needs continued PT services  PT Problem List Decreased strength;Decreased mobility;Decreased activity tolerance;Decreased knowledge of use of DME;Decreased balance       PT Treatment Interventions DME instruction;Therapeutic activities;Gait training;Functional mobility training;Therapeutic exercise;Patient/family education;Balance training    PT  Goals (Current goals can be found in the Care Plan section)  Acute Rehab PT Goals Patient Stated Goal: does not state PT Goal Formulation: With patient Time For Goal Achievement: 07/29/20 Potential to Achieve Goals: Fair    Frequency Min 2X/week   Barriers to discharge        Co-evaluation               AM-PAC PT "6 Clicks" Mobility  Outcome Measure Help needed turning from your back to your side while in a flat bed without using bedrails?: A Little Help needed moving from lying on your back to sitting on the side of a flat bed without using bedrails?: A Little Help needed moving to and from a bed to a chair (including a wheelchair)?: A Little Help needed standing up from a chair using your arms (e.g., wheelchair or bedside chair)?: A Lot Help needed to walk in hospital room?: A Lot Help needed climbing 3-5 steps with a railing? : A Lot 6 Click Score: 15    End of Session Equipment Utilized During Treatment: Gait belt Activity Tolerance: Patient tolerated treatment well Patient left: with call bell/phone within reach;in chair;with chair alarm set;with nursing/sitter in room   PT Visit Diagnosis: Other abnormalities of gait and mobility (R26.89);Difficulty in walking, not elsewhere classified (R26.2)    Time: 1410-1445 PT Time Calculation (min) (ACUTE ONLY): 35 min   Charges:   PT Evaluation $PT Eval Moderate Complexity: 1 Mod PT Treatments $Gait Training: 8-22 mins        Delice Bison, PT  Acute Rehab Dept ([facility]) [phone] Pager [phone]  07/15/2020   [facility] 07/15/2020, 3:16 PM

## 2020-07-15 NOTE — Plan of Care (Signed)

## 2020-07-15 NOTE — Progress Notes (Addendum)
PROGRESS NOTE    Sheila Andrade   ZOX:[id number]  DOB: January 23, 1935  DOA: 07/14/2020 PCP: Dalphine Handing, MD   Brief Narrative:  Sheila Andrade an 85 year old female with depression who presents to the hospital for suicidal ideation.  She is found to have a UA is suggestive of a UTI.   Subjective: She has occasional burning when she tries to urinate.  She continues to feel depressed.    Assessment & Plan:   Principal Problem:   MDD (major depressive disorder), recurrent episode, severe (HCC) Suicidal ideation -Started Effexor about 2 weeks ago-Remeron and Zoloft have been discontinued by her PCP - For her suicidal ideation, I have consulted psychiatry -Titrate as recommended inpatient psychiatric admission, continue Effexor and add Abilify  Active Problems:   Poor fluid intake Anorexia and weight loss  Hyponatremia - has h/o orthostatic hypotension - She has received 1 L of normal saline in boluses - encourage fluids- hold IVF - remove heart healthy restriction on diet - nutrition consult -Sodium has improved from 130-135 today- - Recheck tomorrow - Follow I&O's  Neurological changes - I have spoken with her daughter-in-law who mentions that the patient has had a shuffling gait and tremor and episodes where she gazes away and is slow to respond to questions.  This is all in addition to her personality changes depression and poor oral intake - MRI head negative - have ordered neurochecks Q 4 and requested a neuro consult    UTI (urinary tract infection)? -UA is nitrite positive, few back to area, 0-5 WBCs-  -Stop ceftriaxone and follow-up culture         Time spent in minutes: 35 DVT prophylaxis: SCDs Start: 07/15/20 0036  Code Status: Full code Family Communication: Conversation with Daughter in Orthoptist Level of Care: Level of care: Med-Surg Disposition Plan:  Status is: Observation  The patient will require care spanning > 2 midnights and should be  moved to inpatient because: Inpatient level of care appropriate due to severity of illness  Dispo: The patient is from: Home              Anticipated d/c is to:  [facility] psych              Patient currently is not medically stable to d/c.   Difficult to place patient Yes      Consultants:  psychiatry Procedures:  none Antimicrobials:  Anti-infectives (From admission, onward)    Start     Dose/Rate Route Frequency Ordered Stop   07/15/20 2000  cefTRIAXone (ROCEPHIN) 1 g in sodium chloride 0.9 % 100 mL IVPB        1 g 200 mL/hr over 30 Minutes Intravenous Every 24 hours 07/15/20 0000     07/14/20 2315  cefTRIAXone (ROCEPHIN) 1 g in sodium chloride 0.9 % 100 mL IVPB        1 g 200 mL/hr over 30 Minutes Intravenous  Once 07/14/20 2307 07/15/20 0040        Objective: Vitals:   07/15/20 1324 07/15/20 1520 07/15/20 1521 07/15/20 1526  BP: 139/80     Pulse: 84     Resp: 18     Temp: 97.6 F (36.4 C)     TempSrc: Oral     SpO2: 100% 96% 94% 96%  Weight:      Height:        Intake/Output Summary (Last 24 hours) at 07/15/2020 1631 Last data filed at 07/15/2020 1445 Gross per 24 hour  Intake 2514.23 ml  Output 850 ml  Net 1664.23 ml   Filed Weights   07/14/20 1755  Weight: 51.3 kg    Examination: General exam: Appears comfortable  HEENT: PERRLA, oral mucosa moist, no sclera icterus or thrush Respiratory system: Clear to auscultation. Respiratory effort normal. Cardiovascular system: S1 & S2 heard, RRR.   Gastrointestinal system: Abdomen soft, non-tender, nondistended. Normal bowel sounds. Central nervous system: Alert and oriented. No focal neurological deficits. Extremities: No cyanosis, clubbing or edema Skin: No rashes or ulcers Psychiatry: flat affect    Data Reviewed: I have personally reviewed following labs and imaging studies  CBC: Recent Labs  Lab 07/14/20 2025 07/15/20 0331  WBC 7.5 7.6  NEUTROABS 4.8 5.3  HGB 12.4 12.6  HCT 39.1 39.3  MCV 89.5  88.3  PLT 349 319   Basic Metabolic Panel: Recent Labs  Lab 07/14/20 2025 07/15/20 0000 07/15/20 0331  NA 130*  --  135  K 4.2  --  3.9  CL 98  --  102  CO2 23  --  26  GLUCOSE 97  --  93  BUN 16  --  12  CREATININE 0.60  --  0.56  CALCIUM 9.4  --  9.3  MG  --  2.1 2.1  PHOS  --  4.0 3.5   GFR: Estimated Creatinine Clearance: 43.9 mL/min (by C-G formula based on SCr of 0.56 mg/dL). Liver Function Tests: Recent Labs  Lab 07/14/20 2025 07/15/20 0331  AST 20 17  ALT 15 16  ALKPHOS 59 58  BILITOT 0.6 0.4  PROT 7.2 6.9  ALBUMIN 4.0 3.9   No results for input(s): LIPASE, AMYLASE in the last 168 hours. Recent Labs  Lab 07/14/20 2025  AMMONIA 28   Coagulation Profile: No results for input(s): INR, PROTIME in the last 168 hours. Cardiac Enzymes: Recent Labs  Lab 07/15/20 0030  CKTOTAL 59   BNP (last 3 results) No results for input(s): PROBNP in the last 8760 hours. HbA1C: No results for input(s): HGBA1C in the last 72 hours. CBG: No results for input(s): GLUCAP in the last 168 hours. Lipid Profile: No results for input(s): CHOL, HDL, LDLCALC, TRIG, CHOLHDL, LDLDIRECT in the last 72 hours. Thyroid Function Tests: Recent Labs    07/14/20 2025  TSH 1.430   Anemia Panel: Recent Labs    07/15/20 0331  VITAMINB12 226  FOLATE 12.7  FERRITIN 156  TIBC 318  IRON 57  RETICCTPCT 1.0   Urine analysis:    Component Value Date/Time   COLORURINE YELLOW 07/14/2020 1849   APPEARANCEUR CLOUDY (A) 07/14/2020 1849   LABSPEC 1.014 07/14/2020 1849   LABSPEC 1.020 05/27/2020 1541   PHURINE 7.0 07/14/2020 1849   GLUCOSEU NEGATIVE 07/14/2020 1849   HGBUR NEGATIVE 07/14/2020 1849   BILIRUBINUR NEGATIVE 07/14/2020 1849   BILIRUBINUR negative 05/27/2020 1541   KETONESUR NEGATIVE 07/14/2020 1849   PROTEINUR NEGATIVE 07/14/2020 1849   NITRITE POSITIVE (A) 07/14/2020 1849   LEUKOCYTESUR NEGATIVE 07/14/2020 1849   Sepsis  Labs: @LABRCNTIP (procalcitonin:4,lacticidven:4) ) Recent Results (from the past 240 hour(s))  Resp Panel by RT-PCR (Flu A&B, Covid) Nasopharyngeal Swab     Status: None   Collection Time: 07/14/20  8:25 PM   Specimen: Nasopharyngeal Swab; Nasopharyngeal(NP) swabs in vial transport medium  Result Value Ref Range Status   SARS Coronavirus 2 by RT PCR NEGATIVE NEGATIVE Final    Comment: (NOTE) SARS-CoV-2 target nucleic acids are NOT DETECTED.  The SARS-CoV-2 RNA is generally detectable in upper  respiratory specimens during the acute phase of infection. The lowest concentration of SARS-CoV-2 viral copies this assay can detect is 138 copies/mL. A negative result does not preclude SARS-Cov-2 infection and should not be used as the sole basis for treatment or other patient management decisions. A negative result may occur with  improper specimen collection/handling, submission of specimen other than nasopharyngeal swab, presence of viral mutation(s) within the areas targeted by this assay, and inadequate number of viral copies(<138 copies/mL). A negative result must be combined with clinical observations, patient history, and epidemiological information. The expected result is Negative.  Fact Sheet for Patients:  BloggerCourse.com  Fact Sheet for Healthcare Providers:  SeriousBroker.it  This test is no t yet approved or cleared by the [place] FDA and  has been authorized for detection and/or diagnosis of SARS-CoV-2 by FDA under an Emergency Use Authorization (EUA). This EUA will remain  in effect (meaning this test can be used) for the duration of the COVID-19 declaration under Section 564(b)(1) of the Act, 21 U.S.C.section 360bbb-3(b)(1), unless the authorization is terminated  or revoked sooner.       Influenza A by PCR NEGATIVE NEGATIVE Final   Influenza B by PCR NEGATIVE NEGATIVE Final    Comment: (NOTE) The Xpert Xpress  SARS-CoV-2/FLU/RSV plus assay is intended as an aid in the diagnosis of influenza from Nasopharyngeal swab specimens and should not be used as a sole basis for treatment. Nasal washings and aspirates are unacceptable for Xpert Xpress SARS-CoV-2/FLU/RSV testing.  Fact Sheet for Patients: BloggerCourse.com  Fact Sheet for Healthcare Providers: SeriousBroker.it  This test is not yet approved or cleared by the [place] FDA and has been authorized for detection and/or diagnosis of SARS-CoV-2 by FDA under an Emergency Use Authorization (EUA). This EUA will remain in effect (meaning this test can be used) for the duration of the COVID-19 declaration under Section 564(b)(1) of the Act, 21 U.S.C. section 360bbb-3(b)(1), unless the authorization is terminated or revoked.  Performed at [facility], [address] [place], [place] [address]          Radiology Studies: DG Chest 2 View  Result Date: 07/14/2020 CLINICAL DATA:  Failure to thrive, weight loss EXAM: CHEST - 2 VIEW COMPARISON:  Radiograph 05/31/2020 FINDINGS: Suspect a small calcified mediastinal lymph node seen on lateral radiograph. Remaining cardiomediastinal contours are unremarkable. Bandlike opacity in the right lung base, likely scarring given similar to prior. No consolidation, features of edema, pneumothorax, or effusion. Degenerative changes are present in the imaged spine and shoulders. Evidence of calcific tendinosis in the left shoulder. Left cervical carotid atherosclerosis. IMPRESSION: Suspect a small calcified mediastinal/hilar node. Can reflect sequela of prior granulomatous disease. No acute cardiopulmonary abnormality. Electronically Signed   By: Kreg Shropshire M.D.   On: 07/14/2020 20:56   MR BRAIN WO CONTRAST  Result Date: 07/14/2020 CLINICAL DATA:  Mental status change. EXAM: MRI HEAD WITHOUT CONTRAST TECHNIQUE: Multiplanar, multiecho  pulse sequences of the brain and surrounding structures were obtained without intravenous contrast. COMPARISON:  None. FINDINGS: Brain: No acute infarction, acute hemorrhage, hydrocephalus, extra-axial collection or mass lesion. Moderate scattered T2/FLAIR hyperintensities within the white matter, which are nonspecific but most likely related to chronic microvascular ischemic disease given patient age. Mild-to-moderate atrophy with ex vacuo ventricular dilation. The callosal angle is not acute and there is no crowding of sulci at the vertex. Dilated perivascular space in the inferior left basal ganglia. Small focus of susceptibility artifact in the lateral left cerebellum, most  likely a prior microhemorrhage. Vascular: Major arterial flow voids are maintained skull base. Skull and upper cervical spine: Normal marrow signal. Sinuses/Orbits: Visualized sinuses are clear. No acute orbital findings. Other: Small left mastoid effusion. IMPRESSION: 1. No evidence of acute intracranial abnormality 2. Moderate chronic microvascular ischemic disease and mild-to-moderate atrophy. 3. Small left mastoid effusion. Electronically Signed   By: Feliberto Harts MD   On: 07/14/2020 20:11   DG Hip Unilat W or Wo Pelvis 2-3 Views Left  Result Date: 07/14/2020 CLINICAL DATA:  Failure to thrive and weight loss EXAM: DG HIP (WITH OR WITHOUT PELVIS) 2-3V LEFT COMPARISON:  01/01/2020 FINDINGS: Pelvic ring is intact. Old healed pubic rami fractures are noted on the left stable from prior CT examination. Degenerative changes of the hip joints are noted right greater than left. No acute fracture or dislocation in the left hip is noted. Some widening of the left sacroiliac joint is noted which appears more prominent than that seen on prior CT. IMPRESSION: Slight widening of the left sacroiliac joint which may be projectional in nature. Degenerative changes of the hip joints bilaterally right greater than left. No acute fracture is seen.  Old fractures in the left pubic rami are seen. Electronically Signed   By: Alcide Clever M.D.   On: 07/14/2020 20:56      Scheduled Meds:  [START ON 07/16/2020] ARIPiprazole  5 mg Oral Daily   brinzolamide  1 drop Left Eye BID   feeding supplement  237 mL Oral BID BM   latanoprost  1 drop Left Eye QHS   simvastatin  20 mg Oral Daily   traZODone  25 mg Oral QHS   [START ON 07/16/2020] venlafaxine XR  75 mg Oral q morning   Continuous Infusions:  cefTRIAXone (ROCEPHIN)  IV       LOS: 0 days      Calvert Cantor, MD [facility] Pager: [email or web address] 07/15/2020, 4:31 PM

## 2020-07-15 NOTE — Consult Note (Signed)
[facility] Face-to-Face Psychiatry Consult   Reason for Consult: Suicidal Referring Physician: Dr. Butler Denmark Patient Identification: Laveah Gloster MRN:  [id number] Principal Diagnosis: MDD (major depressive disorder), recurrent episode, severe (HCC) Diagnosis:  Principal Problem:   MDD (major depressive disorder), recurrent episode, severe (HCC) Active Problems:   Weight loss   Anemia   UTI (urinary tract infection)   Hyponatremia   Suicidal ideations   Total Time spent with patient: 45 minutes  Subjective:   Tinisha Etzkorn is a 85 y.o. female patient admitted with confusion, difficulty standing weakness, loss of balance status post pelvic fracture.  Psychiatric consult placed due to patient endorsing suicidal ideations upon admission.  Patient reports worsening depressive symptoms to include suicidal ideations, poor appetite, weight loss, hopelessness, worthlessness, anhedonia, recurrent thoughts of death since her last fall in 26-Jan-2020.  She reports her primary stressors are declining physical illness, compounded with recent COVID infection followed by urinary retention, and most recently a pelvic fracture.  Patient reports having a history of depression, and has been treated with Zoloft and trazodone in the past.  She reports ineffectiveness with both medications, and was recently switched to Effexor by her primary care provider Dr. Clelia Croft.  She also reports Effexor has not been working to manage her depressive symptoms.  When assessing for protective factors patient is unable to name any, she also does not have any spiritual beliefs.  When assessing for support system she replies" my family will be all right and get on without me."  When assessing for forward and ongoing motivation she states" I hope you will be able to help me a lot and not a little bit.  I actually do want to get better, nobody likes feeling this way."  She does appear to have insight into her current state of depression.  When  assessing for suicidal plan she states" just ending my life.  I cannot think of any fast quick ways to get it over with.  And neither do I have the means, always to do it."  She denies any substance use, that could be contributing to her ongoing state of depression.  At this current time she endorses active suicidal ideations, with no plan or no means.  She is also able to contract for safety at this time.  Patient is seen and assessed by this nurse practitioner.  Case is discussed with Dr. Lucianne Muss, in addition to treatment team.  On today's evaluation patient describes her mood as "not good. Im having bad thoughts."  She has been experiencing depressive symptoms since her declining physical health 10 months ago, that was compounded with physical illness " illness after her illness after COVID after her illness after urinary retention after a fall that led to a pelvic fracture and neurological pain."  She states her depression has worsened and has led to an almost 30 pound weight loss, since last year.  She states her depression has not improved despite being compliant with 2 previous antidepressant medications that are currently being prescribed by her primary care provider.  She further endorses ongoing anxiety that seems to be heightened by her state of depression.   She denies any current side effects, and or adverse reactions as it relates to her Effexor XR 37.5 mg p.o. daily.   She is able to contract for safety while on the unit. She is very withdrawn on presentation, poor eye contact, does not, and dysphoric mood.  She continues to endorse ongoing suicidal ideations that are active, however  she has no plan, no access, or any means to complete suicide.  Therefore her suicidal risk remains low while in the hospital.  Patient will benefit from inpatient psychiatric hospitalization for crisis stabilization, medication management.  She denies homicidal ideations and or auditory or visual hallucinations.  HPI:   Ferne Ellingwood is a 85 y.o. female with medical history significant of recurrent UTI, recent COVID, glaucoma osteoporosis, HLD, depression     Presented with  confusion not being able to stand, weakness, loss of balance Progressive since January when she had a pelvic fracture She lost 4 lbs over past week States she does not want to live anymore Denies any particular plans says she does not have a means to end her life but if she came across any means she would do it  Past Psychiatric History: Depression.  She has previously tried trazodone and Zoloft.  She reports no improvement or efficacy with either medication, was transitioned to Effexor by her primary care provider Dr. Clelia Croft.  She denies any previous suicide attempts, suicidal ideations, and or self-harm behavior.  She denies any history of substance abuse.  She denies any previous inpatient psychiatric admissions.  She denies any previous outpatient behavioral health services to include therapy.  Risk to Self: Yes Risk to Others: No Prior Inpatient Therapy: Denies Prior Outpatient Therapy: Denies, is receiving psychotropic medication from primary care provider Dr. Clelia Croft.  Past Medical History:  Past Medical History:  Diagnosis Date   Hot flashes 06/06/2020   Hypertension    History reviewed. No pertinent surgical history. Family History:  Family History  Problem Relation Age of Onset   Hypertension Other    Family Psychiatric  History: She denies" not to my knowledge".  Social History:  Social History   Substance and Sexual Activity  Alcohol Use None     Social History   Substance and Sexual Activity  Drug Use Never    Social History   Socioeconomic History   Marital status: Widowed    Spouse name: Not on file   Number of children: Not on file   Years of education: Not on file   Highest education level: Not on file  Occupational History   Not on file  Tobacco Use   Smoking status: Never   Smokeless tobacco:  Never  Substance and Sexual Activity   Alcohol use: Not on file   Drug use: Never   Sexual activity: Not on file  Other Topics Concern   Not on file  Social History Narrative   Not on file   Social Determinants of Health   Financial Resource Strain: Not on file  Food Insecurity: Not on file  Transportation Needs: Not on file  Physical Activity: Not on file  Stress: Not on file  Social Connections: Not on file   Additional Social History:    Allergies:  No Known Allergies  Labs:  Results for orders placed or performed during the hospital encounter of 07/14/20 (from the past 48 hour(s))  Urinalysis, Routine w reflex microscopic     Status: Abnormal   Collection Time: 07/14/20  6:49 PM  Result Value Ref Range   Color, Urine YELLOW YELLOW   APPearance CLOUDY (A) CLEAR   Specific Gravity, Urine 1.014 1.005 - 1.030   pH 7.0 5.0 - 8.0   Glucose, UA NEGATIVE NEGATIVE mg/dL   Hgb urine dipstick NEGATIVE NEGATIVE   Bilirubin Urine NEGATIVE NEGATIVE   Ketones, ur NEGATIVE NEGATIVE mg/dL   Protein, ur NEGATIVE NEGATIVE  mg/dL   Nitrite POSITIVE (A) NEGATIVE   Leukocytes,Ua NEGATIVE NEGATIVE   RBC / HPF 0-5 0 - 5 RBC/hpf   WBC, UA 0-5 0 - 5 WBC/hpf   Bacteria, UA FEW (A) NONE SEEN   Squamous Epithelial / LPF 0-5 0 - 5   Budding Yeast PRESENT    Amorphous Crystal PRESENT     Comment: Performed at [facility], [address], [place], [place] [address]  TSH     Status: None   Collection Time: 07/14/20  8:25 PM  Result Value Ref Range   TSH 1.430 0.350 - 4.500 uIU/mL    Comment: Performed by a 3rd Generation assay with a functional sensitivity of <=0.01 uIU/mL. Performed at [facility], [address] [place], [place] [address]   CBC with Differential     Status: None   Collection Time: 07/14/20  8:25 PM  Result Value Ref Range   WBC 7.5 4.0 - 10.5 K/uL   RBC 4.37 3.87 - 5.11 MIL/uL   Hemoglobin 12.4 12.0 - 15.0 g/dL   HCT 09.8 11.9  - 14.7 %   MCV 89.5 80.0 - 100.0 fL   MCH 28.4 26.0 - 34.0 pg   MCHC 31.7 30.0 - 36.0 g/dL   RDW 82.9 56.2 - 13.0 %   Platelets 349 150 - 400 K/uL   nRBC 0.0 0.0 - 0.2 %   Neutrophils Relative % 63 %   Neutro Abs 4.8 1.7 - 7.7 K/uL   Lymphocytes Relative 26 %   Lymphs Abs 1.9 0.7 - 4.0 K/uL   Monocytes Relative 9 %   Monocytes Absolute 0.6 0.1 - 1.0 K/uL   Eosinophils Relative 1 %   Eosinophils Absolute 0.1 0.0 - 0.5 K/uL   Basophils Relative 1 %   Basophils Absolute 0.0 0.0 - 0.1 K/uL   Immature Granulocytes 0 %   Abs Immature Granulocytes 0.02 0.00 - 0.07 K/uL    Comment: Performed at [facility], [address], [place], [place] [address]  Comprehensive metabolic panel     Status: Abnormal   Collection Time: 07/14/20  8:25 PM  Result Value Ref Range   Sodium 130 (L) 135 - 145 mmol/L   Potassium 4.2 3.5 - 5.1 mmol/L   Chloride 98 98 - 111 mmol/L   CO2 23 22 - 32 mmol/L   Glucose, Bld 97 70 - 99 mg/dL    Comment: Glucose reference range applies only to samples taken after fasting for at least 8 hours.   BUN 16 8 - 23 mg/dL   Creatinine, Ser 4.69 0.44 - 1.00 mg/dL   Calcium 9.4 8.9 - 62.9 mg/dL   Total Protein 7.2 6.5 - 8.1 g/dL   Albumin 4.0 3.5 - 5.0 g/dL   AST 20 15 - 41 U/L   ALT 15 0 - 44 U/L   Alkaline Phosphatase 59 38 - 126 U/L   Total Bilirubin 0.6 0.3 - 1.2 mg/dL   GFR, Estimated >52 >84 mL/min    Comment: (NOTE) Calculated using the CKD-EPI Creatinine Equation (2021)    Anion gap 9 5 - 15    Comment: Performed at [facility], [address] [place], [place] [address]  Lactic acid, plasma     Status: None   Collection Time: 07/14/20  8:25 PM  Result Value Ref Range   Lactic Acid, Venous 1.1 0.5 - 1.9 mmol/L    Comment: Performed at [facility], [address] [place], [place] [address]  Resp Panel by RT-PCR (Flu A&B, Covid) Nasopharyngeal Swab     Status: None   Collection Time: 07/14/20  8:25 PM    Specimen: Nasopharyngeal Swab; Nasopharyngeal(NP) swabs in vial transport medium  Result Value Ref Range   SARS Coronavirus 2 by RT PCR NEGATIVE NEGATIVE    Comment: (NOTE) SARS-CoV-2 target nucleic acids are NOT DETECTED.  The SARS-CoV-2 RNA is generally detectable in upper respiratory specimens during the acute phase of infection. The lowest concentration of SARS-CoV-2 viral copies this assay can detect is 138 copies/mL. A negative result does not preclude SARS-Cov-2 infection and should not be used as the sole basis for treatment or other patient management decisions. A negative result may occur with  improper specimen collection/handling, submission of specimen other than nasopharyngeal swab, presence of viral mutation(s) within the areas targeted by this assay, and inadequate number of viral copies(<138 copies/mL). A negative result must be combined with clinical observations, patient history, and epidemiological information. The expected result is Negative.  Fact Sheet for Patients:  BloggerCourse.com  Fact Sheet for Healthcare Providers:  SeriousBroker.it  This test is no t yet approved or cleared by the [place] FDA and  has been authorized for detection and/or diagnosis of SARS-CoV-2 by FDA under an Emergency Use Authorization (EUA). This EUA will remain  in effect (meaning this test can be used) for the duration of the COVID-19 declaration under Section 564(b)(1) of the Act, 21 U.S.C.section 360bbb-3(b)(1), unless the authorization is terminated  or revoked sooner.       Influenza A by PCR NEGATIVE NEGATIVE   Influenza B by PCR NEGATIVE NEGATIVE    Comment: (NOTE) The Xpert Xpress SARS-CoV-2/FLU/RSV plus assay is intended as an aid in the diagnosis of influenza from Nasopharyngeal swab specimens and should not be used as a sole basis for treatment. Nasal washings and aspirates are unacceptable for Xpert Xpress  SARS-CoV-2/FLU/RSV testing.  Fact Sheet for Patients: BloggerCourse.com  Fact Sheet for Healthcare Providers: SeriousBroker.it  This test is not yet approved or cleared by the [place] FDA and has been authorized for detection and/or diagnosis of SARS-CoV-2 by FDA under an Emergency Use Authorization (EUA). This EUA will remain in effect (meaning this test can be used) for the duration of the COVID-19 declaration under Section 564(b)(1) of the Act, 21 U.S.C. section 360bbb-3(b)(1), unless the authorization is terminated or revoked.  Performed at [facility], [address], [place], [place] [address]   Ammonia     Status: None   Collection Time: 07/14/20  8:25 PM  Result Value Ref Range   Ammonia 28 9 - 35 umol/L    Comment: Performed at [facility], [address] [place], [place] [address]  Magnesium     Status: None   Collection Time: 07/15/20 12:00 AM  Result Value Ref Range   Magnesium 2.1 1.7 - 2.4 mg/dL    Comment: Performed at [facility], [address] [place], [place] [address]  Phosphorus     Status: None   Collection Time: 07/15/20 12:00 AM  Result Value Ref Range   Phosphorus 4.0 2.5 - 4.6 mg/dL    Comment: Performed at [facility], [address] [place], [place] [address]  CK     Status: None   Collection Time: 07/15/20 12:30 AM  Result Value Ref Range   Total CK 59 38 - 234 U/L    Comment: Performed at  [facility], [address] [place], [place] [address]  Lactic acid, plasma     Status: None   Collection Time: 07/15/20  1:05 AM  Result Value Ref Range   Lactic Acid, Venous 0.6 0.5 - 1.9 mmol/L    Comment: Performed at [facility], [address] [place], [place] [address]  Vitamin B12     Status: None   Collection Time: 07/15/20  3:31 AM  Result Value Ref Range   Vitamin  B-12 226 180 - 914 pg/mL    Comment: (NOTE) This assay is not validated for testing neonatal or myeloproliferative syndrome specimens for Vitamin B12 levels. Performed at [facility], [address] [place], [place] [address]   Folate     Status: None   Collection Time: 07/15/20  3:31 AM  Result Value Ref Range   Folate 12.7 >5.9 ng/mL    Comment: Performed at [facility], [address] [place], [place] [address]  Iron and TIBC     Status: None   Collection Time: 07/15/20  3:31 AM  Result Value Ref Range   Iron 57 28 - 170 ug/dL   TIBC 295 621 - 308 ug/dL   Saturation Ratios 18 10.4 - 31.8 %   UIBC 261 ug/dL    Comment: Performed at [facility], [address] [place], [place] [address]  Ferritin     Status: None   Collection Time: 07/15/20  3:31 AM  Result Value Ref Range   Ferritin 156 11 - 307 ng/mL    Comment: Performed at [facility], [address], [place], [place] [address]  Reticulocytes     Status: None   Collection Time: 07/15/20  3:31 AM  Result Value Ref Range   Retic Ct Pct 1.0 0.4 - 3.1 %   RBC. 4.40 3.87 - 5.11 MIL/uL   Retic Count, Absolute 44.0 19.0 - 186.0 K/uL   Immature Retic Fract 11.8 2.3 - 15.9 %    Comment: Performed at [facility], [address] [place], [place] [address]  Magnesium     Status: None   Collection Time: 07/15/20  3:31 AM  Result Value Ref Range   Magnesium 2.1 1.7 - 2.4 mg/dL    Comment: Performed at [facility], [address], [place], [place] [address]  Phosphorus     Status: None   Collection Time: 07/15/20  3:31 AM  Result Value Ref Range   Phosphorus 3.5 2.5 - 4.6 mg/dL    Comment: Performed at [facility], [address] [place], [place] [address]  CBC WITH DIFFERENTIAL     Status: None   Collection Time: 07/15/20  3:31 AM  Result Value Ref Range   WBC 7.6 4.0 - 10.5 K/uL   RBC  4.45 3.87 - 5.11 MIL/uL   Hemoglobin 12.6 12.0 - 15.0 g/dL   HCT 53.6 64.4 - 03.4 %   MCV 88.3 80.0 - 100.0 fL   MCH 28.3 26.0 - 34.0 pg   MCHC 32.1 30.0 - 36.0 g/dL   RDW 74.2 59.5 - 63.8 %   Platelets 319 150 - 400 K/uL   nRBC 0.0 0.0 - 0.2 %   Neutrophils Relative % 71 %   Neutro Abs 5.3 1.7 - 7.7 K/uL   Lymphocytes Relative 20 %   Lymphs Abs 1.5 0.7 - 4.0 K/uL   Monocytes Relative 8 %   Monocytes  Absolute 0.6 0.1 - 1.0 K/uL   Eosinophils Relative 1 %   Eosinophils Absolute 0.1 0.0 - 0.5 K/uL   Basophils Relative 0 %   Basophils Absolute 0.0 0.0 - 0.1 K/uL   Immature Granulocytes 0 %   Abs Immature Granulocytes 0.03 0.00 - 0.07 K/uL    Comment: Performed at [facility], [address] [place], [place] [address]  Comprehensive metabolic panel     Status: None   Collection Time: 07/15/20  3:31 AM  Result Value Ref Range   Sodium 135 135 - 145 mmol/L   Potassium 3.9 3.5 - 5.1 mmol/L   Chloride 102 98 - 111 mmol/L   CO2 26 22 - 32 mmol/L   Glucose, Bld 93 70 - 99 mg/dL    Comment: Glucose reference range applies only to samples taken after fasting for at least 8 hours.   BUN 12 8 - 23 mg/dL   Creatinine, Ser 4.78 0.44 - 1.00 mg/dL   Calcium 9.3 8.9 - 29.5 mg/dL   Total Protein 6.9 6.5 - 8.1 g/dL   Albumin 3.9 3.5 - 5.0 g/dL   AST 17 15 - 41 U/L   ALT 16 0 - 44 U/L   Alkaline Phosphatase 58 38 - 126 U/L   Total Bilirubin 0.4 0.3 - 1.2 mg/dL   GFR, Estimated >62 >13 mL/min    Comment: (NOTE) Calculated using the CKD-EPI Creatinine Equation (2021)    Anion gap 7 5 - 15    Comment: Performed at [facility], [address] [place], [place] [address]    Current Facility-Administered Medications  Medication Dose Route Frequency Provider Last Rate Last Admin   0.9 %  sodium chloride infusion  75 mL/hr Intravenous Continuous Therisa Doyne, MD 75 mL/hr at 07/15/20 0823 Infusion Verify at 07/15/20 8469   acetaminophen (TYLENOL)  tablet 650 mg  650 mg Oral Q6H PRN Therisa Doyne, MD       Or   acetaminophen (TYLENOL) suppository 650 mg  650 mg Rectal Q6H PRN Doutova, Anastassia, MD       brinzolamide (AZOPT) 1 % ophthalmic suspension 1 drop  1 drop Left Eye BID Therisa Doyne, MD   1 drop at 07/15/20 6295   cefTRIAXone (ROCEPHIN) 1 g in sodium chloride 0.9 % 100 mL IVPB  1 g Intravenous Q24H Doutova, Anastassia, MD       cyanocobalamin ((VITAMIN B-12)) injection 1,000 mcg  1,000 mcg Subcutaneous Once Rizwan, Ladell Heads, MD       feeding supplement (ENSURE ENLIVE / ENSURE PLUS) liquid 237 mL  237 mL Oral BID BM Doutova, Anastassia, MD   237 mL at 07/15/20 2841   HYDROcodone-acetaminophen (NORCO/VICODIN) 5-325 MG per tablet 1-2 tablet  1-2 tablet Oral Q4H PRN Doutova, Anastassia, MD       latanoprost (XALATAN) 0.005 % ophthalmic solution 1 drop  1 drop Left Eye QHS Doutova, Anastassia, MD       polyethylene glycol (MIRALAX / GLYCOLAX) packet 17 g  17 g Oral Daily PRN Rizwan, Ladell Heads, MD       simvastatin (ZOCOR) tablet 20 mg  20 mg Oral Daily Doutova, Anastassia, MD   20 mg at 07/15/20 0811   traZODone (DESYREL) tablet 25 mg  25 mg Oral QHS Doutova, Anastassia, MD   25 mg at 07/14/20 2132   venlafaxine XR (EFFEXOR-XR) 24 hr capsule 37.5 mg  37.5 mg Oral q morning Calvert Cantor, MD        Musculoskeletal: Strength &  Muscle Tone: within normal limits Gait & Station: normal Patient leans: N/A            Psychiatric Specialty Exam:  Presentation  General Appearance: Appropriate for Environment; Casual  Eye Contact:Minimal  Speech:Clear and Coherent; Normal Rate  Speech Volume:Normal  Handedness:Right   Mood and Affect  Mood:Depressed; Dysphoric  Affect:Depressed; Flat   Thought Process  Thought Processes:Linear; Coherent; Goal Directed  Descriptions of Associations:Intact  Orientation:Full (Time, Place and Person)  Thought Content:Logical; WDL  History of Schizophrenia/Schizoaffective  disorder:No data recorded Duration of Psychotic Symptoms:No data recorded Hallucinations:Hallucinations: None  Ideas of Reference:None  Suicidal Thoughts:Suicidal Thoughts: Yes, Active SI Active Intent and/or Plan: With Intent; Without Plan; Without Means to Carry Out; Without Access to Means  Homicidal Thoughts:Homicidal Thoughts: No   Sensorium  Memory:Immediate Fair; Remote Fair; Recent Fair  Judgment:Fair  Insight:Fair   Executive Functions  Concentration:Fair  Attention Span:Fair  Recall:Fair  Fund of Knowledge:Fair  Language:Fair   Psychomotor Activity  Psychomotor Activity:Psychomotor Activity: Normal   Assets  Assets:Desire for Improvement; Manufacturing systems engineerCommunication Skills; Leisure Time; Housing; Social Support; Talents/Skills; Physical Health; Financial Resources/Insurance; Resilience   Sleep  Sleep:Sleep: Fair   Physical Exam: Physical Exam Vitals and nursing note reviewed.  Constitutional:      Appearance: Normal appearance. She is normal weight.  Neurological:     Mental Status: She is alert.  Psychiatric:        Attention and Perception: Attention and perception normal.        Mood and Affect: Mood is depressed. Affect is flat.        Speech: Speech normal.        Behavior: Behavior is withdrawn. Behavior is cooperative.        Thought Content: Thought content includes suicidal ideation.        Cognition and Memory: Cognition and memory normal.        Judgment: Judgment normal.   Review of Systems  Constitutional:  Positive for malaise/fatigue and weight loss. Fever: pain. Psychiatric/Behavioral:  Positive for depression and suicidal ideas. The patient is nervous/anxious and has insomnia.   All other systems reviewed and are negative. Blood pressure (!) 155/79, pulse 80, temperature 98.3 F (36.8 C), temperature source Oral, resp. rate 18, height 5\' 7"  (1.702 m), weight 51.3 kg, SpO2 96 %. Body mass index is 17.7 kg/m.    Assessment: A  85 year old female who presented with significant medical history of recurrent UTI, history of COVID infection, depression who presented with suicidal thoughts, weight loss, poor appetite and worsening depressive symptoms.  She has been compliant with her Effexor, and was previously treated with Zoloft neither of which have been effective in managing her depression.  She endorses ongoing depressive symptoms, to include suicidal thoughts since January 2022.  She is unable to identify any protective factors, always to keep herself safe.  At this time she continues to endorse ongoing suicidal ideations, and will benefit from inpatient hospitalization.  Treatment Plan Summary: Medication management and Plan patient will benefit from inpatient geropsych admission, once she is medically stable.  We will resume home medications at this tim.,  She will benefit from adjusting her Effexor to further target depressive symptoms.  She will also benefit from augmentative therapy for worsening depressive symptoms, and suicidal ideations.  Patient although actively suicidal with no intent, and or access to means her suicide completion risks is low while in the hospital.  Patient is able to contract for safety while in the hospital, can discontinue  safety sitter at this time.  -Will increase Effexor 75 mg p.o. daily.  We will also start Abilify 2 mg p.o. and a single dose, will increase Abilify to 5 mg p.o. daily starting tomorrow July 16, 2020. -Recommend [facility] consult for inpatient geriatric psychiatric admission for crisis stabilization, medication management, additional coping skills and mental health stability. -Consider discontinuing safety sitter, as patient is able to contract for safety -Psychiatry will continue to follow patient as appropriate for medication titration and adjustment throughout this hospitalization stay.  Disposition: Recommend psychiatric Inpatient admission when medically cleared.  Maryagnes Amos, FNP 07/15/2020 10:28 AM

## 2020-07-15 NOTE — Evaluation (Signed)
Clinical/Bedside Swallow Evaluation Patient Details  Name: Sheila Andrade MRN: [id number] Date of Birth: [birth year]  Today's Date: 07/15/2020 Time: SLP Start Time (ACUTE ONLY): 0930 SLP Stop Time (ACUTE ONLY): 0945 SLP Time Calculation (min) (ACUTE ONLY): 15 min  Past Medical History:  Past Medical History:  Diagnosis Date   Hot flashes 06/06/2020   Hypertension    Past Surgical History: History reviewed. No pertinent surgical history. HPI:  85yo female admitted 07/14/20 with failure to thrive, fatigue, weakness, inability to stand. Pt also with suicidal ideation. PMH: recurrent UTI, recent COVID, glaucoma, osteoporosis, HLD, depression   Assessment / Plan / Recommendation Clinical Impression  Pt seen during breakfast for assessment of swallow function and safety. Pt reports no history of difficulty swallowing, but indicates poor po intake due to lack of appetite recently. CN exam unremarkable. Voice quality clear, volitional cough strong. Pt was observed with thin liquids via cup and straw, puree, and solid textures. Adequate oral prep noted, with no oral residue seen. Swallow appears timely, and no overt s/s aspiration observed on any texture. CXR is negative, pt is afebrile. No further ST intervention recommended at this time. Will continue current diet. Please reconsult if needs arise. SLP Visit Diagnosis: Dysphagia, unspecified (R13.10)    Aspiration Risk  Mild aspiration risk    Diet Recommendation Regular;Thin liquid   Liquid Administration via: Cup;Straw Medication Administration: Whole meds with liquid Supervision: Patient able to self feed Compensations: Slow rate;Small sips/bites Postural Changes: Seated upright at 90 degrees    Other  Recommendations Oral Care Recommendations: Oral care BID   Follow up Recommendations None          Prognosis Prognosis for Safe Diet Advancement: Good      Swallow Study   General Date of Onset: 07/14/20 HPI: 84yo female admitted  07/14/20 with failure to thrive, fatigue, weakness, inability to stand. Pt also with suicidal ideation. PMH: recurrent UTI, recent COVID, glaucoma, osteoporosis, HLD, depression Type of Study: Bedside Swallow Evaluation Previous Swallow Assessment: none Diet Prior to this Study: Regular Temperature Spikes Noted: No Respiratory Status: Room air History of Recent Intubation: No Behavior/Cognition: Alert;Cooperative;Other (Comment) (flat affect) Oral Cavity Assessment: Within Functional Limits Oral Care Completed by SLP: No Vision: Functional for self-feeding Self-Feeding Abilities: Able to feed self Patient Positioning: Upright in bed Baseline Vocal Quality: Normal Volitional Cough: Strong Volitional Swallow: Able to elicit    Oral/Motor/Sensory Function Overall Oral Motor/Sensory Function: Within functional limits   Ice Chips Ice chips: Not tested   Thin Liquid Thin Liquid: Within functional limits Presentation: Cup;Straw;Self Fed    Nectar Thick Nectar Thick Liquid: Not tested   Honey Thick Honey Thick Liquid: Not tested   Puree Puree: Within functional limits Presentation: Self Fed   Solid     Solid: Within functional limits Presentation: Self Fed     Sheila Andrade B. Sheila Andrade, [facility], CCC-SLP Speech Language Pathologist Office: [phone] Pager: [phone]  Sheila Andrade 07/15/2020,9:55 AM

## 2020-07-16 DIAGNOSIS — Z79899 Other long term (current) drug therapy: Secondary | ICD-10-CM | POA: Diagnosis not present

## 2020-07-16 DIAGNOSIS — Z8616 Personal history of COVID-19: Secondary | ICD-10-CM | POA: Diagnosis not present

## 2020-07-16 DIAGNOSIS — E860 Dehydration: Principal | ICD-10-CM

## 2020-07-16 DIAGNOSIS — D6489 Other specified anemias: Secondary | ICD-10-CM | POA: Diagnosis present

## 2020-07-16 DIAGNOSIS — R627 Adult failure to thrive: Secondary | ICD-10-CM | POA: Diagnosis present

## 2020-07-16 DIAGNOSIS — E440 Moderate protein-calorie malnutrition: Secondary | ICD-10-CM | POA: Diagnosis present

## 2020-07-16 DIAGNOSIS — F419 Anxiety disorder, unspecified: Secondary | ICD-10-CM | POA: Diagnosis present

## 2020-07-16 DIAGNOSIS — E559 Vitamin D deficiency, unspecified: Secondary | ICD-10-CM | POA: Diagnosis present

## 2020-07-16 DIAGNOSIS — F688 Other specified disorders of adult personality and behavior: Secondary | ICD-10-CM | POA: Diagnosis present

## 2020-07-16 DIAGNOSIS — R5381 Other malaise: Secondary | ICD-10-CM | POA: Diagnosis not present

## 2020-07-16 DIAGNOSIS — R634 Abnormal weight loss: Secondary | ICD-10-CM | POA: Diagnosis not present

## 2020-07-16 DIAGNOSIS — R339 Retention of urine, unspecified: Secondary | ICD-10-CM | POA: Diagnosis not present

## 2020-07-16 DIAGNOSIS — R45851 Suicidal ideations: Secondary | ICD-10-CM | POA: Diagnosis present

## 2020-07-16 DIAGNOSIS — Z79818 Long term (current) use of other agents affecting estrogen receptors and estrogen levels: Secondary | ICD-10-CM | POA: Diagnosis not present

## 2020-07-16 DIAGNOSIS — R32 Unspecified urinary incontinence: Secondary | ICD-10-CM | POA: Diagnosis present

## 2020-07-16 DIAGNOSIS — E785 Hyperlipidemia, unspecified: Secondary | ICD-10-CM | POA: Diagnosis present

## 2020-07-16 DIAGNOSIS — M810 Age-related osteoporosis without current pathological fracture: Secondary | ICD-10-CM | POA: Diagnosis present

## 2020-07-16 DIAGNOSIS — H409 Unspecified glaucoma: Secondary | ICD-10-CM | POA: Diagnosis present

## 2020-07-16 DIAGNOSIS — E871 Hypo-osmolality and hyponatremia: Secondary | ICD-10-CM | POA: Diagnosis present

## 2020-07-16 DIAGNOSIS — F332 Major depressive disorder, recurrent severe without psychotic features: Secondary | ICD-10-CM | POA: Diagnosis present

## 2020-07-16 DIAGNOSIS — E538 Deficiency of other specified B group vitamins: Secondary | ICD-10-CM | POA: Diagnosis present

## 2020-07-16 DIAGNOSIS — Z7983 Long term (current) use of bisphosphonates: Secondary | ICD-10-CM | POA: Diagnosis not present

## 2020-07-16 DIAGNOSIS — F322 Major depressive disorder, single episode, severe without psychotic features: Secondary | ICD-10-CM | POA: Diagnosis not present

## 2020-07-16 DIAGNOSIS — F331 Major depressive disorder, recurrent, moderate: Secondary | ICD-10-CM | POA: Diagnosis not present

## 2020-07-16 DIAGNOSIS — R630 Anorexia: Secondary | ICD-10-CM | POA: Diagnosis present

## 2020-07-16 DIAGNOSIS — G4700 Insomnia, unspecified: Secondary | ICD-10-CM | POA: Diagnosis present

## 2020-07-16 DIAGNOSIS — I471 Supraventricular tachycardia: Secondary | ICD-10-CM | POA: Diagnosis not present

## 2020-07-16 DIAGNOSIS — D649 Anemia, unspecified: Secondary | ICD-10-CM | POA: Diagnosis present

## 2020-07-16 DIAGNOSIS — Z681 Body mass index (BMI) 19 or less, adult: Secondary | ICD-10-CM | POA: Diagnosis not present

## 2020-07-16 DIAGNOSIS — F32A Depression, unspecified: Secondary | ICD-10-CM | POA: Diagnosis not present

## 2020-07-16 DIAGNOSIS — I10 Essential (primary) hypertension: Secondary | ICD-10-CM | POA: Diagnosis present

## 2020-07-16 DIAGNOSIS — G259 Extrapyramidal and movement disorder, unspecified: Secondary | ICD-10-CM | POA: Diagnosis not present

## 2020-07-16 DIAGNOSIS — G20 Parkinson's disease: Secondary | ICD-10-CM | POA: Diagnosis not present

## 2020-07-16 DIAGNOSIS — N3941 Urge incontinence: Secondary | ICD-10-CM | POA: Diagnosis not present

## 2020-07-16 LAB — BASIC METABOLIC PANEL
Anion gap: 7 (ref 5–15)
BUN: 21 mg/dL (ref 8–23)
CO2: 24 mmol/L (ref 22–32)
Calcium: 9.2 mg/dL (ref 8.9–10.3)
Chloride: 102 mmol/L (ref 98–111)
Creatinine, Ser: 0.69 mg/dL (ref 0.44–1.00)
GFR, Estimated: >60 mL/min (ref >60)
Glucose, Bld: 100 mg/dL — ABNORMAL HIGH (ref 70–99)
Potassium: 4.2 mmol/L (ref 3.5–5.1)
Sodium: 133 mmol/L — ABNORMAL LOW (ref 135–145)

## 2020-07-16 MED ORDER — VENLAFAXINE HCL ER 75 MG PO CP24
75.0000 mg | ORAL_CAPSULE | Freq: Every morning | ORAL | Status: AC
  Administered 2020-07-17 – 2020-07-18 (×2): 75 mg via ORAL
  Filled 2020-07-16 (×2): qty 1

## 2020-07-16 MED ORDER — LACTATED RINGERS IV SOLN: INTRAVENOUS | Status: DC

## 2020-07-16 MED ORDER — ARIPIPRAZOLE 5 MG PO TABS
5.0000 mg | ORAL_TABLET | Freq: Every day | ORAL | Status: DC
  Administered 2020-07-17: 5 mg via ORAL
  Filled 2020-07-16: qty 1

## 2020-07-16 MED ORDER — VENLAFAXINE HCL ER 150 MG PO CP24
150.0000 mg | ORAL_CAPSULE | Freq: Every day | ORAL | Status: DC
  Administered 2020-07-19 – 2020-07-20 (×2): 150 mg via ORAL
  Filled 2020-07-16 (×3): qty 1

## 2020-07-16 MED ORDER — ARIPIPRAZOLE 5 MG PO TABS: 7.5000 mg | ORAL_TABLET | Freq: Every day | ORAL | Status: DC

## 2020-07-16 MED ORDER — VITAMIN B-12 100 MCG PO TABS
50.0000 ug | ORAL_TABLET | Freq: Every day | ORAL | Status: DC
  Administered 2020-07-16 – 2020-07-17 (×2): 50 ug via ORAL
  Filled 2020-07-16 (×2): qty 1

## 2020-07-16 NOTE — Evaluation (Signed)
Occupational Therapy Evaluation Patient Details Name: Sheila Andrade MRN: [id number] DOB: May 17, 1935 Today's Date: 07/16/2020    History of Present Illness 85 y.o. female adm with recurrent UTI PMH:  recent COVID, glaucoma osteoporosis, HLD, depression . Pysch consulted   Clinical Impression   Information obtained from daughter in law, prior to recent medical decline in past ~6 months patient very independent living alone and cognitively sharp had a doctorate in education. Patient had a fall sustaining pelvic fractures around 8-9 months ago and since has been living with her daughter and needing increased assist with self care tasks. Was working with home health OT/PT, ambulating with walker and getting to sink to brush her teeth, toileting up until this past weekend "she could not stand." Currently patient presents with impaired cognition with decreased task initiation needing repetitive multimodal cues and heavy mod A due to posterior bias for stand pivot transfer to recliner chair. Patient also with flat affect with fear of falling "don't let go of me, I'm falling" needing reassurance throughout transfer. Would currently recommend short term rehab to maximize patient independence with self care and reduce caregiver burden, will continue to follow acutely.     Follow Up Recommendations  SNF    Equipment Recommendations  Other (comment) (defer to next venue)       Precautions / Restrictions Precautions Precautions: Fall Restrictions Weight Bearing Restrictions: No      Mobility Bed Mobility Overal bed mobility: Needs Assistance Bed Mobility: Supine to Sit     Supine to sit: Mod assist;HOB elevated     General bed mobility comments: increased time with poor initiation. mod A for trunk support to sit upright and tactile cues for bringing legs to edge of bed    Transfers Overall transfer level: Needs assistance Equipment used: Rolling walker (2 wheeled) Transfers: Stand Pivot  Transfers   Stand pivot transfers: Mod assist       General transfer comment: strong posterior lean when powering up to stand, cues to weight shift forward. mod A for balance while taking small shuffled steps toward recliner, high fall risk    Balance Overall balance assessment: Needs assistance Sitting-balance support: Feet supported;Bilateral upper extremity supported Sitting balance-Leahy Scale: Poor     Standing balance support: Bilateral upper extremity supported Standing balance-Leahy Scale: Poor Standing balance comment: reliant on UEs and external assist                           ADL either performed or assessed with clinical judgement   ADL Overall ADL's : Needs assistance/impaired     Grooming: Supervision/safety;Sitting   Upper Body Bathing: Supervision/ safety;Sitting   Lower Body Bathing: Maximal assistance;Sitting/lateral leans;Sit to/from stand   Upper Body Dressing : Supervision/safety;Sitting;Bed level   Lower Body Dressing: Total assistance;Sit to/from stand;Sitting/lateral leans Lower Body Dressing Details (indicate cue type and reason): to don shoes seated EOB Toilet Transfer: Moderate assistance;Cueing for safety;Cueing for sequencing;Stand-pivot;RW Toilet Transfer Details (indicate cue type and reason): to recliner, strong posterior lean in powering up to stand heavy mod A. cues to weight shift with mild improvement and mod A for balance to take small shuffled steps with walker to recliner. poor safety insight asking to sit before lined up with chair Toileting- Clothing Manipulation and Hygiene: Total assistance;Sitting/lateral lean;Bed level;Sit to/from stand       Functional mobility during ADLs: Moderate assistance;Cueing for safety;Cueing for sequencing;Rolling walker General ADL Comments: per family patient was able to participate in  self care tasks needing intermittent assist with LB ADLs, currently patient needing increased assist for  functional transfers and all ADL tasks      Pertinent Vitals/Pain Pain Assessment: Faces Faces Pain Scale: No hurt     Hand Dominance  (did not specify)   Extremity/Trunk Assessment Upper Extremity Assessment Upper Extremity Assessment: Generalized weakness   Lower Extremity Assessment Lower Extremity Assessment: Defer to PT evaluation       Communication Communication Communication: No difficulties   Cognition Arousal/Alertness: Awake/alert Behavior During Therapy: Flat affect Overall Cognitive Status: Impaired/Different from baseline Area of Impairment: Following commands;Safety/judgement;Problem solving                       Following Commands: Follows one step commands with increased time Safety/Judgement: Decreased awareness of safety   Problem Solving: Slow processing;Decreased initiation;Difficulty sequencing;Requires verbal cues;Requires tactile cues General Comments: per daughter in law patient has doctorate in education, has published books most recent ~2 years ago and loved travelling. started noticing changes in mentation more noticably since fall and pelvic fractures ~8-9 months ago.              Home Living Family/patient expects to be discharged to:: Skilled nursing facility Living Arrangements: Children                               Additional Comments: has been living with her dtr since pelvic fx ~ 8-9 mos ago      Prior Functioning/Environment Level of Independence: Needs assistance  Gait / Transfers Assistance Needed: up until past weekend patient was ambulating with walker per daughter in law ADL's / Homemaking Assistance Needed: per daughter in law patient working with [facility], was able to stand at sink to brush her teeth, performing toileting but daughter assisted with bathing and some dressing tasks.            OT Problem List: Decreased strength;Decreased activity tolerance;Impaired balance (sitting and/or  standing);Decreased cognition;Decreased safety awareness;Decreased knowledge of use of DME or AE      OT Treatment/Interventions: Self-care/ADL training;Balance training;Patient/family education;Cognitive remediation/compensation;Therapeutic activities;Therapeutic exercise;DME and/or AE instruction    OT Goals(Current goals can be found in the care plan section) Acute Rehab OT Goals Patient Stated Goal: "figure out what is going on" OT Goal Formulation: With family Time For Goal Achievement: 07/30/20 Potential to Achieve Goals: Fair  OT Frequency: Min 2X/week    AM-PAC OT "6 Clicks" Daily Activity     Outcome Measure Help from another person eating meals?: A Little Help from another person taking care of personal grooming?: A Little Help from another person toileting, which includes using toliet, bedpan, or urinal?: A Lot Help from another person bathing (including washing, rinsing, drying)?: A Lot Help from another person to put on and taking off regular upper body clothing?: A Little Help from another person to put on and taking off regular lower body clothing?: Total 6 Click Score: 14   End of Session Equipment Utilized During Treatment: Gait belt;Rolling walker Nurse Communication: Mobility status  Activity Tolerance: Patient tolerated treatment well Patient left: in chair;with call bell/phone within reach;with family/visitor present;with nursing/sitter in room  OT Visit Diagnosis: Other abnormalities of gait and mobility (R26.89);Muscle weakness (generalized) (M62.81);Other symptoms and signs involving cognitive function;Unsteadiness on feet (R26.81)                Time: 4854-6270 OT Time Calculation (min): 18 min Charges:  OT General Charges $OT Visit: 1 Visit OT Evaluation $OT Eval Low Complexity: 1 Low  Marlyce Huge OT OT pager: [phone]  Carmelia Roller 07/16/2020, 11:34 AM

## 2020-07-16 NOTE — Progress Notes (Signed)
Patient was asleep, unable to get neuro check for 0400. Will update on-coming nurse.

## 2020-07-16 NOTE — Consult Note (Signed)
[facility] Face-to-Face Psychiatry Consult   Reason for Consult: Suicidal Referring Physician: Dr. Butler Denmark Patient Identification: Sheila Andrade MRN:  [id number] Principal Diagnosis: MDD (major depressive disorder), recurrent episode, severe (HCC) Diagnosis:  Principal Problem:   MDD (major depressive disorder), recurrent episode, severe (HCC) Active Problems:   Weight loss   Anemia   UTI (urinary tract infection)   Hyponatremia   Suicidal ideations   Total Time spent with patient: 30 minutes  Subjective: Patient is seen and assessed by this nurse practitioner.  Case is discussed with Dr. Lucianne Muss, in addition to treatment team.  On today's evaluation patient describes her mood as not good.  She further elaborates that she does not wish to go an inpatient psychiatric hospital. "I would liek to continue with outpatient therapy and medications. " She reports her primary stressors continue to be compounded by illnesses, recent pelvic fracture, COVID infection, and overall quality of life change.  I would like to start back running and being able to take care of myself as I used to."  She continues to show motivation and hope for improvement and her future.  She currently rates improvement in her depression and her anxiety, as it relates to her family involvement, positive reassurance, and motivation for a better future.    She endorses a good sleep, yet continues to have a poor appetite.  She shows improved insight and judgment, noting financial concerns regarding repeated SNF placement. "  When who was going to pay for another SNF visit?  We need to find these things out before I go to facility.  When will I be able to speak with social work to discuss these things?  Patient is offered much support, encouragement, and reassurance that all financial matters will be handled, and to reduce her concerns and anxieties just focused on her current health and mental health wellbeing.  She reports she is drinking  ensures as meal replacements to help increase her caloric intake.  She was started on Effexor 75 mg XR p.o. daily and Abilify 2 mg p.o. daily.  She denies any current side effects, and or adverse reactions as it relates to her new medication that she was started.  At this time she denies suicidal ideations, homicidal ideations, and or auditory or visual hallucinations.  She is able to contract for safety while on the unit.  Long discussion with her daughter in law at the bedside Chip Boer) who is a Publishing rights manager. She tells me that her primary goal at this time is to be her caregiver for the next few weeks. She reports patient has a great family support system, and they are willing to help her. She has no concerns about patients safety at this time. She is aware of the plan for patient to receive outpatient psychiatric services after she is discharged from SNF.    HPI:    Sheila Andrade is a 85 y.o. female patient admitted with confusion, difficulty standing weakness, loss of balance status post pelvic fracture.  Psychiatric consult placed due to patient endorsing suicidal ideations upon admission.  Patient reports worsening depressive symptoms to include suicidal ideations, poor appetite, weight loss, hopelessness, worthlessness, anhedonia, recurrent thoughts of death since her last fall in Jan 30, 2020.  She reports her primary stressors are declining physical illness, compounded with recent COVID infection followed by urinary retention, and most recently a pelvic fracture.  Patient reports having a history of depression, and has been treated with Zoloft and trazodone in the past.  She reports  ineffectiveness with both medications, and was recently switched to Effexor by her primary care provider Dr. Clelia Croft.  She is also able to contract for safety at this time.    HPI:  Sheila Andrade is a 85 y.o. female with medical history significant of recurrent UTI, recent COVID, glaucoma osteoporosis, HLD, depression.    Presented with  confusion not being able to stand, weakness, loss of balance Progressive since January when she had a pelvic fracture She lost 4 lbs over past week States she does not want to live anymore Denies any particular plans says she does not have a means to end her life but if she came across any means she would do it  Past Psychiatric History: Depression.  She has previously tried trazodone and Zoloft.  She reports no improvement or efficacy with either medication, was transitioned to Effexor by her primary care provider Dr. Clelia Croft.  She denies any previous suicide attempts, suicidal ideations, and or self-harm behavior.  She denies any history of substance abuse.  She denies any previous inpatient psychiatric admissions.  She denies any previous outpatient behavioral health services to include therapy.  Risk to Self: Yes Risk to Others: No Prior Inpatient Therapy: Denies Prior Outpatient Therapy: Denies, is receiving psychotropic medication from primary care provider Dr. Clelia Croft.  Past Medical History:  Past Medical History:  Diagnosis Date   Hot flashes 06/06/2020   Hypertension    History reviewed. No pertinent surgical history. Family History:  Family History  Problem Relation Age of Onset   Hypertension Other    Family Psychiatric  History: She denies" not to my knowledge".  Social History:  Social History   Substance and Sexual Activity  Alcohol Use None     Social History   Substance and Sexual Activity  Drug Use Never    Social History   Socioeconomic History   Marital status: Widowed    Spouse name: Not on file   Number of children: Not on file   Years of education: Not on file   Highest education level: Not on file  Occupational History   Not on file  Tobacco Use   Smoking status: Never   Smokeless tobacco: Never  Substance and Sexual Activity   Alcohol use: Not on file   Drug use: Never   Sexual activity: Not on file  Other Topics Concern   Not  on file  Social History Narrative   Not on file   Social Determinants of Health   Financial Resource Strain: Not on file  Food Insecurity: Not on file  Transportation Needs: Not on file  Physical Activity: Not on file  Stress: Not on file  Social Connections: Not on file   Additional Social History:    Allergies:  No Known Allergies  Labs:  Results for orders placed or performed during the hospital encounter of 07/14/20 (from the past 48 hour(s))  Urinalysis, Routine w reflex microscopic     Status: Abnormal   Collection Time: 07/14/20  6:49 PM  Result Value Ref Range   Color, Urine YELLOW YELLOW   APPearance CLOUDY (A) CLEAR   Specific Gravity, Urine 1.014 1.005 - 1.030   pH 7.0 5.0 - 8.0   Glucose, UA NEGATIVE NEGATIVE mg/dL   Hgb urine dipstick NEGATIVE NEGATIVE   Bilirubin Urine NEGATIVE NEGATIVE   Ketones, ur NEGATIVE NEGATIVE mg/dL   Protein, ur NEGATIVE NEGATIVE mg/dL   Nitrite POSITIVE (A) NEGATIVE   Leukocytes,Ua NEGATIVE NEGATIVE   RBC / HPF 0-5  0 - 5 RBC/hpf   WBC, UA 0-5 0 - 5 WBC/hpf   Bacteria, UA FEW (A) NONE SEEN   Squamous Epithelial / LPF 0-5 0 - 5   Budding Yeast PRESENT    Amorphous Crystal PRESENT     Comment: Performed at [facility], [address] [place], [place] [address]  Urine culture     Status: Abnormal (Preliminary result)   Collection Time: 07/14/20  6:49 PM   Specimen: Urine, Random  Result Value Ref Range   Specimen Description      URINE, RANDOM Performed at [facility], [address] [place], [place] [address]    Special Requests      NONE Performed at [facility], [address], [place], [place] [address]    Culture (A)     >=100,000 COLONIES/mL GRAM NEGATIVE RODS CULTURE REINCUBATED FOR BETTER GROWTH Performed at [facility], [address] [place], [place] [address]    Report Status PENDING   TSH     Status: None   Collection Time: 07/14/20  8:25  PM  Result Value Ref Range   TSH 1.430 0.350 - 4.500 uIU/mL    Comment: Performed by a 3rd Generation assay with a functional sensitivity of <=0.01 uIU/mL. Performed at [facility], [address] [place], [place] [address]   CBC with Differential     Status: None   Collection Time: 07/14/20  8:25 PM  Result Value Ref Range   WBC 7.5 4.0 - 10.5 K/uL   RBC 4.37 3.87 - 5.11 MIL/uL   Hemoglobin 12.4 12.0 - 15.0 g/dL   HCT 44.0 34.7 - 42.5 %   MCV 89.5 80.0 - 100.0 fL   MCH 28.4 26.0 - 34.0 pg   MCHC 31.7 30.0 - 36.0 g/dL   RDW 95.6 38.7 - 56.4 %   Platelets 349 150 - 400 K/uL   nRBC 0.0 0.0 - 0.2 %   Neutrophils Relative % 63 %   Neutro Abs 4.8 1.7 - 7.7 K/uL   Lymphocytes Relative 26 %   Lymphs Abs 1.9 0.7 - 4.0 K/uL   Monocytes Relative 9 %   Monocytes Absolute 0.6 0.1 - 1.0 K/uL   Eosinophils Relative 1 %   Eosinophils Absolute 0.1 0.0 - 0.5 K/uL   Basophils Relative 1 %   Basophils Absolute 0.0 0.0 - 0.1 K/uL   Immature Granulocytes 0 %   Abs Immature Granulocytes 0.02 0.00 - 0.07 K/uL    Comment: Performed at [facility], [address] [place], [place] [address]  Comprehensive metabolic panel     Status: Abnormal   Collection Time: 07/14/20  8:25 PM  Result Value Ref Range   Sodium 130 (L) 135 - 145 mmol/L   Potassium 4.2 3.5 - 5.1 mmol/L   Chloride 98 98 - 111 mmol/L   CO2 23 22 - 32 mmol/L   Glucose, Bld 97 70 - 99 mg/dL    Comment: Glucose reference range applies only to samples taken after fasting for at least 8 hours.   BUN 16 8 - 23 mg/dL   Creatinine, Ser 1.88 0.44 - 1.00 mg/dL   Calcium 9.4 8.9 - 41.6 mg/dL   Total Protein 7.2 6.5 - 8.1 g/dL   Albumin 4.0 3.5 - 5.0 g/dL   AST 20 15 - 41 U/L   ALT 15 0 - 44 U/L   Alkaline Phosphatase 59 38 - 126 U/L  Total Bilirubin 0.6 0.3 - 1.2 mg/dL   GFR, Estimated >22 >29 mL/min    Comment: (NOTE) Calculated using the CKD-EPI Creatinine Equation (2021)    Anion gap 9 5 -  15    Comment: Performed at [facility], [address], [place], [place] [address]  Lactic acid, plasma     Status: None   Collection Time: 07/14/20  8:25 PM  Result Value Ref Range   Lactic Acid, Venous 1.1 0.5 - 1.9 mmol/L    Comment: Performed at [facility], [address] [place], [place] [address]  Resp Panel by RT-PCR (Flu A&B, Covid) Nasopharyngeal Swab     Status: None   Collection Time: 07/14/20  8:25 PM   Specimen: Nasopharyngeal Swab; Nasopharyngeal(NP) swabs in vial transport medium  Result Value Ref Range   SARS Coronavirus 2 by RT PCR NEGATIVE NEGATIVE    Comment: (NOTE) SARS-CoV-2 target nucleic acids are NOT DETECTED.  The SARS-CoV-2 RNA is generally detectable in upper respiratory specimens during the acute phase of infection. The lowest concentration of SARS-CoV-2 viral copies this assay can detect is 138 copies/mL. A negative result does not preclude SARS-Cov-2 infection and should not be used as the sole basis for treatment or other patient management decisions. A negative result may occur with  improper specimen collection/handling, submission of specimen other than nasopharyngeal swab, presence of viral mutation(s) within the areas targeted by this assay, and inadequate number of viral copies(<138 copies/mL). A negative result must be combined with clinical observations, patient history, and epidemiological information. The expected result is Negative.  Fact Sheet for Patients:  BloggerCourse.com  Fact Sheet for Healthcare Providers:  SeriousBroker.it  This test is no t yet approved or cleared by the [place] FDA and  has been authorized for detection and/or diagnosis of SARS-CoV-2 by FDA under an Emergency Use Authorization (EUA). This EUA will remain  in effect (meaning this test can be used) for the duration of the COVID-19 declaration under Section 564(b)(1)  of the Act, 21 U.S.C.section 360bbb-3(b)(1), unless the authorization is terminated  or revoked sooner.       Influenza A by PCR NEGATIVE NEGATIVE   Influenza B by PCR NEGATIVE NEGATIVE    Comment: (NOTE) The Xpert Xpress SARS-CoV-2/FLU/RSV plus assay is intended as an aid in the diagnosis of influenza from Nasopharyngeal swab specimens and should not be used as a sole basis for treatment. Nasal washings and aspirates are unacceptable for Xpert Xpress SARS-CoV-2/FLU/RSV testing.  Fact Sheet for Patients: BloggerCourse.com  Fact Sheet for Healthcare Providers: SeriousBroker.it  This test is not yet approved or cleared by the [place] FDA and has been authorized for detection and/or diagnosis of SARS-CoV-2 by FDA under an Emergency Use Authorization (EUA). This EUA will remain in effect (meaning this test can be used) for the duration of the COVID-19 declaration under Section 564(b)(1) of the Act, 21 U.S.C. section 360bbb-3(b)(1), unless the authorization is terminated or revoked.  Performed at [facility], [address] [place], [place] [address]   Ammonia     Status: None   Collection Time: 07/14/20  8:25 PM  Result Value Ref Range   Ammonia 28 9 - 35 umol/L    Comment: Performed at [facility], [address], [place], [place] [address]  Osmolality     Status: None   Collection Time: 07/15/20 12:00 AM  Result Value Ref Range   Osmolality 276 275 - 295 mOsm/kg  Comment: Performed at [facility], [address] [place], [place] [address]  Magnesium     Status: None   Collection Time: 07/15/20 12:00 AM  Result Value Ref Range   Magnesium 2.1 1.7 - 2.4 mg/dL    Comment: Performed at [facility], [address], [place], [place] [address]  Phosphorus     Status: None   Collection Time: 07/15/20 12:00 AM  Result Value Ref Range   Phosphorus 4.0 2.5  - 4.6 mg/dL    Comment: Performed at [facility], [address], [place], [place] [address]  CK     Status: None   Collection Time: 07/15/20 12:30 AM  Result Value Ref Range   Total CK 59 38 - 234 U/L    Comment: Performed at [facility], [address] [place], [place] [address]  Lactic acid, plasma     Status: None   Collection Time: 07/15/20  1:05 AM  Result Value Ref Range   Lactic Acid, Venous 0.6 0.5 - 1.9 mmol/L    Comment: Performed at [facility], [address], [place], [place] [address]  Vitamin B12     Status: None   Collection Time: 07/15/20  3:31 AM  Result Value Ref Range   Vitamin B-12 226 180 - 914 pg/mL    Comment: (NOTE) This assay is not validated for testing neonatal or myeloproliferative syndrome specimens for Vitamin B12 levels. Performed at [facility], [address] [place], [place] [address]   Folate     Status: None   Collection Time: 07/15/20  3:31 AM  Result Value Ref Range   Folate 12.7 >5.9 ng/mL    Comment: Performed at [facility], [address], [place], [place] [address]  Iron and TIBC     Status: None   Collection Time: 07/15/20  3:31 AM  Result Value Ref Range   Iron 57 28 - 170 ug/dL   TIBC 330 076 - 226 ug/dL   Saturation Ratios 18 10.4 - 31.8 %   UIBC 261 ug/dL    Comment: Performed at [facility], [address] [place], [place] [address]  Ferritin     Status: None   Collection Time: 07/15/20  3:31 AM  Result Value Ref Range   Ferritin 156 11 - 307 ng/mL    Comment: Performed at [facility], [address] [place], [place] [address]  Reticulocytes     Status: None   Collection Time: 07/15/20  3:31 AM  Result Value Ref Range   Retic Ct Pct 1.0 0.4 - 3.1 %   RBC. 4.40 3.87 - 5.11 MIL/uL   Retic Count, Absolute 44.0 19.0 - 186.0 K/uL   Immature Retic Fract 11.8 2.3 - 15.9 %    Comment:  Performed at [facility], [address], [place], [place] [address]  Magnesium     Status: None   Collection Time: 07/15/20  3:31 AM  Result Value Ref Range   Magnesium 2.1 1.7 - 2.4 mg/dL    Comment: Performed at [facility] [address], [place], [place] [address]  Phosphorus     Status: None   Collection Time: 07/15/20  3:31 AM  Result Value Ref Range   Phosphorus 3.5 2.5 - 4.6 mg/dL    Comment: Performed at [facility], [address], [place], [place] [address]  CBC WITH DIFFERENTIAL  Status: None   Collection Time: 07/15/20  3:31 AM  Result Value Ref Range   WBC 7.6 4.0 - 10.5 K/uL   RBC 4.45 3.87 - 5.11 MIL/uL   Hemoglobin 12.6 12.0 - 15.0 g/dL   HCT 16.1 09.6 - 04.5 %   MCV 88.3 80.0 - 100.0 fL   MCH 28.3 26.0 - 34.0 pg   MCHC 32.1 30.0 - 36.0 g/dL   RDW 40.9 81.1 - 91.4 %   Platelets 319 150 - 400 K/uL   nRBC 0.0 0.0 - 0.2 %   Neutrophils Relative % 71 %   Neutro Abs 5.3 1.7 - 7.7 K/uL   Lymphocytes Relative 20 %   Lymphs Abs 1.5 0.7 - 4.0 K/uL   Monocytes Relative 8 %   Monocytes Absolute 0.6 0.1 - 1.0 K/uL   Eosinophils Relative 1 %   Eosinophils Absolute 0.1 0.0 - 0.5 K/uL   Basophils Relative 0 %   Basophils Absolute 0.0 0.0 - 0.1 K/uL   Immature Granulocytes 0 %   Abs Immature Granulocytes 0.03 0.00 - 0.07 K/uL    Comment: Performed at [facility], [address] [place], [place] [address]  Comprehensive metabolic panel     Status: None   Collection Time: 07/15/20  3:31 AM  Result Value Ref Range   Sodium 135 135 - 145 mmol/L   Potassium 3.9 3.5 - 5.1 mmol/L   Chloride 102 98 - 111 mmol/L   CO2 26 22 - 32 mmol/L   Glucose, Bld 93 70 - 99 mg/dL    Comment: Glucose reference range applies only to samples taken after fasting for at least 8 hours.   BUN 12 8 - 23 mg/dL   Creatinine, Ser 6.21 0.44 - 1.00 mg/dL   Calcium 9.3 8.9 - 30.8 mg/dL   Total Protein 6.9 6.5 - 8.1  g/dL   Albumin 3.9 3.5 - 5.0 g/dL   AST 17 15 - 41 U/L   ALT 16 0 - 44 U/L   Alkaline Phosphatase 58 38 - 126 U/L   Total Bilirubin 0.4 0.3 - 1.2 mg/dL   GFR, Estimated >65 >78 mL/min    Comment: (NOTE) Calculated using the CKD-EPI Creatinine Equation (2021)    Anion gap 7 5 - 15    Comment: Performed at [facility], [address] [place], [place] [address]  Basic metabolic panel     Status: Abnormal   Collection Time: 07/16/20  4:43 AM  Result Value Ref Range   Sodium 133 (L) 135 - 145 mmol/L   Potassium 4.2 3.5 - 5.1 mmol/L   Chloride 102 98 - 111 mmol/L   CO2 24 22 - 32 mmol/L   Glucose, Bld 100 (H) 70 - 99 mg/dL    Comment: Glucose reference range applies only to samples taken after fasting for at least 8 hours.   BUN 21 8 - 23 mg/dL   Creatinine, Ser 9.52 0.44 - 1.00 mg/dL   Calcium 9.2 8.9 - 84.1 mg/dL   GFR, Estimated >32 >44 mL/min    Comment: (NOTE) Calculated using the CKD-EPI Creatinine Equation (2021)    Anion gap 7 5 - 15    Comment: Performed at [facility], [address], [place], [place] [address]    Current Facility-Administered Medications  Medication Dose Route Frequency Provider Last Rate Last Admin   acetaminophen (TYLENOL) tablet 650 mg  650 mg Oral Q6H PRN Therisa Doyne, MD  Or   acetaminophen (TYLENOL) suppository 650 mg  650 mg Rectal Q6H PRN Doutova, Anastassia, MD       ARIPiprazole (ABILIFY) tablet 5 mg  5 mg Oral Daily Maryagnes Amos, FNP   5 mg at 07/16/20 0849   brinzolamide (AZOPT) 1 % ophthalmic suspension 1 drop  1 drop Left Eye BID Therisa Doyne, MD   1 drop at 07/16/20 0850   feeding supplement (ENSURE ENLIVE / ENSURE PLUS) liquid 237 mL  237 mL Oral BID BM Doutova, Anastassia, MD   237 mL at 07/16/20 0850   HYDROcodone-acetaminophen (NORCO/VICODIN) 5-325 MG per tablet 1-2 tablet  1-2 tablet Oral Q4H PRN Doutova, Anastassia, MD       latanoprost (XALATAN) 0.005 % ophthalmic  solution 1 drop  1 drop Left Eye QHS Doutova, Anastassia, MD       polyethylene glycol (MIRALAX / GLYCOLAX) packet 17 g  17 g Oral Daily PRN Calvert Cantor, MD   17 g at 07/15/20 1031   simvastatin (ZOCOR) tablet 20 mg  20 mg Oral Daily Doutova, Anastassia, MD   20 mg at 07/16/20 0848   traZODone (DESYREL) tablet 25 mg  25 mg Oral QHS Doutova, Anastassia, MD   25 mg at 07/15/20 2159   venlafaxine XR (EFFEXOR-XR) 24 hr capsule 75 mg  75 mg Oral q morning Maryagnes Amos, FNP   75 mg at 07/16/20 0848    Musculoskeletal: Strength & Muscle Tone: within normal limits Gait & Station: normal Patient leans: N/A            Psychiatric Specialty Exam:  Presentation  General Appearance: Appropriate for Environment; Casual  Eye Contact:Minimal  Speech:Slow; Clear and Coherent  Speech Volume:Decreased  Handedness:Right   Mood and Affect  Mood:Depressed; Dysphoric  Affect:Depressed; Flat   Thought Process  Thought Processes:Coherent; Linear; Goal Directed  Descriptions of Associations:Intact  Orientation:Full (Time, Place and Person)  Thought Content:Logical  History of Schizophrenia/Schizoaffective disorder:No data recorded Duration of Psychotic Symptoms:No data recorded Hallucinations:Hallucinations: None  Ideas of Reference:None  Suicidal Thoughts:Suicidal Thoughts: No SI Active Intent and/or Plan: Without Intent; Without Plan; Without Means to Carry Out; Without Access to Means  Homicidal Thoughts:Homicidal Thoughts: No   Sensorium  Memory:Recent Good; Immediate Good; Remote Good  Judgment:Fair  Insight:Fair   Executive Functions  Concentration:Fair  Attention Span:Good  Recall:Good  Fund of Knowledge:Good  Language:Good   Psychomotor Activity  Psychomotor Activity:Psychomotor Activity: Normal   Assets  Assets:Desire for Improvement; Manufacturing systems engineer; Housing; Research scientist (medical); Resilience; Physical Health; Leisure Time;  Vocational/Educational; Talents/Skills; Transportation; Financial Resources/Insurance   Sleep  Sleep:Sleep: Fair   Physical Exam: Physical Exam Vitals and nursing note reviewed.  Constitutional:      Appearance: Normal appearance. She is normal weight.  Neurological:     Mental Status: She is alert.  Psychiatric:        Attention and Perception: Attention and perception normal.        Mood and Affect: Mood is depressed. Affect is flat.        Speech: Speech normal.        Behavior: Behavior is not withdrawn. Behavior is cooperative.        Thought Content: Thought content does not include suicidal ideation.        Cognition and Memory: Cognition and memory normal.        Judgment: Judgment normal.   Review of Systems  Constitutional:  Positive for malaise/fatigue and weight loss. Fever: pain. Psychiatric/Behavioral:  Positive for depression.  Negative for suicidal ideas. The patient is nervous/anxious and has insomnia.   All other systems reviewed and are negative. Blood pressure (!) 158/83, pulse 75, temperature 97.6 F (36.4 C), resp. rate 18, height 5\' 7"  (1.702 m), weight 51.3 kg, SpO2 99 %. Body mass index is 17.7 kg/m.    Assessment: A 85 year old female who presented with significant medical history of recurrent UTI, history of COVID infection, depression who presented with suicidal thoughts, weight loss, poor appetite and worsening depressive symptoms.  She has been compliant with her Effexor, and was previously treated with Zoloft and Lexapro neither of which have been effective in managing her depression.  She endorses ongoing depressive symptoms, to include suicidal thoughts since January 2022.  She is able to verbalize protective factors, endorses hope and motivation that things will get better with time.  Patient currently denies suicidal ideations, homicidal ideations, and or auditory visual hallucinations.  She denies any side effects at this time as a concern to her  new medication that was initiated yesterday.  Family at the bedside discussed treatment plan, medication management, and ongoing need for outpatient behavioral health resources.    Treatment Plan Summary: Medication management and Plan plan is to discharge to skilled nursing facility, when she is medically stable.  At this time it is appropriate for the patient to follow up with outpatient psychiatric resources as noted below.  Will titrate her medication while inpatient to further target symptoms of depression. Patient is able to contract for safety while in the hospital, can discontinue safety sitter at this time.  -Will continue Effexor 75 mg p.o. daily for three days. With a goal to increase Effexor XL 150 mg p.o. daily.  -Will increase Abilify 5 mg p.o. daily starting today, with a goal to increase Abilify to 7.5 mg p.o. daily starting Friday July 19, 2020.  Moan discussion with family at the bedside regarding augmentation antipsychotic medication to further target depressive symptoms. -Patient currently has outpatient geriatric psychiatrist visit scheduled for September 2 in [place].  She is encouraged to continue seeing her outpatient therapist Charlynne PanderDarrow hires.  -Patient is denying suicidal thoughts at this time, also shows ability to contract for safety. Consider D/C safety sitter.  -Psychiatry will continue to follow patient as appropriate for medication titration and adjustment throughout this hospitalization stay.  Disposition: No evidence of imminent risk to self or others at present.   Patient does not meet criteria for psychiatric inpatient admission. Supportive therapy provided about ongoing stressors. Refer to IOP. Discussed crisis plan, support from social network, calling 911, coming to the Emergency Department, and calling Suicide Hotline.  Maryagnes Amosakia S Starkes-Perry, FNP 07/16/2020 10:49 AM

## 2020-07-16 NOTE — Consult Note (Addendum)
NEURO HOSPITALIST CONSULT NOTE   Requesting physician: Dr. Jarvis Newcomer  Reason for Consult: Shuffling gate, tremor, freezing behavior  History obtained from:  Family, daughter-in-law/caregiver at bedside  HPI:                                                                                                                                         Sheila Andrade is an 85 y.o. female with a past medical history significant for hypertension, depression with suicidal ideation, decreased appetite, intermittent dysuria and left pelvis fracture with persistent pain who presented to [facility] with an abrupt loss of balance and significant weakness that began on Saturday and rapidly worsened.   At baseline, Sheila Andrade uses a walker around the house and can manage some of her ADLs (I.e. dental care, but needs help bathing). It seems as though she is largely sedentary except for some exercising that the family assists her with. For several months Sheila Andrade has had an increase in depressive symptoms and a reduction in appetite that the daughter-in-law feels was precipitated by isolation due to the pandemic. Also on this timeline, the family describes a reduction in the outgoing aspects of Sheila Andrade's personality - generally just quieter, less talkative, less gregarious than normal. She has since been seen by psychiatry inpatient and is being followed and managed by their team.  During conversations with family, the primary team learned that Sheila Andrade has a shuffling gait, tremor and episodes of being slow to respond, for which Neurology was consulted. The shuffling gate was noted by family late last year. The family has also reported episodes of freezing behavior where Sheila Andrade "just stops" what she's doing as if she's forgotten. 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 The family also describes motor challenges where she has trouble initiating behavior  but can more easily continue a behavior once she's moving (I.e. walking). Around Christmas she reported that she was unable to sleep and in January of 2022, Sheila Andrade experienced a fall that precipitated a left pelvis fracture and rotator cuff damage.  While she reports she does have long standing constipation, she states her sense of smell is average and denies a poor sense of smell. She reports some difficultly sleeping starting just before her fracture but denies dream enactment to her knowledge (no waking up with bruises etc though she does live alone).   Sheila Andrade has no history of stroke, seizure, migraine. She has an outpatient neurology appointment set for next week. She does feel her ability to move has worsened during her hospitalization  Daughter-in-law present at bedside.  Pertinent Imaging/Diagnostics MRI Brain 11Jul2022: No intracranial abnormality, mild-to-moderate atrophy, moderate chronic microvascular ischemic disease   Pertinent Medications Scheduled Meds:  [START ON 07/17/2020]  ARIPiprazole  5 mg Oral Daily   [START ON 07/19/2020] ARIPiprazole  7.5 mg Oral Daily   brinzolamide  1 drop Left Eye BID   feeding supplement  237 mL Oral BID BM   latanoprost  1 drop Left Eye QHS   simvastatin  20 mg Oral Daily   traZODone  25 mg Oral QHS   [START ON 07/19/2020] venlafaxine XR  150 mg Oral Q breakfast   [START ON 07/17/2020] venlafaxine XR  75 mg Oral q morning   Continuous Infusions:  lactated ringers     PRN Meds:.acetaminophen **OR** acetaminophen, HYDROcodone-acetaminophen, polyethylene glycol    Past Medical History:  Diagnosis Date   Hot flashes 06/06/2020   Hypertension     History reviewed. No pertinent surgical history.  Family History  Problem Relation Age of Onset   Hypertension Other     Social History:  reports that she has never smoked. She has never used smokeless tobacco. She reports that she does not use drugs. No history on file for alcohol  use.  No Known Allergies  MEDICATIONS:                                                                                                                   Current Meds  Medication Sig   acetaminophen (TYLENOL) 500 MG tablet Take 1,000 mg by mouth every 6 (six) hours as needed for mild pain or headache.   bimatoprost (LUMIGAN) 0.01 % SOLN Place 1 drop into the left eye at bedtime.   brinzolamide (AZOPT) 1 % ophthalmic suspension Place 1 drop into the left eye 2 (two) times daily.   cholecalciferol (VITAMIN D) 25 MCG (1000 UNIT) tablet Take 1,000 Units by mouth daily.   Multiple Vitamins-Minerals (MULTIVITAMIN WITH MINERALS) tablet Take 1 tablet by mouth daily.   nystatin (MYCOSTATIN/NYSTOP) powder Apply 1 application topically 3 (three) times daily. (Patient taking differently: Apply 1 application topically 2 (two) times daily as needed (rash under arm).)   omeprazole (PRILOSEC) 40 MG capsule Take 40 mg by mouth daily as needed (heartburn).   ondansetron (ZOFRAN-ODT) 4 MG disintegrating tablet Take 4 mg by mouth every 8 (eight) hours as needed for nausea/vomiting.   polyethylene glycol (MIRALAX / GLYCOLAX) 17 g packet Take 17 g by mouth daily as needed for mild constipation.   simvastatin (ZOCOR) 20 MG tablet Take 20 mg by mouth daily.   traZODone (DESYREL) 50 MG tablet Take 25 mg by mouth at bedtime as needed for sleep.   venlafaxine XR (EFFEXOR-XR) 37.5 MG 24 hr capsule Take 37.5 mg by mouth every morning.     Review Of Systems: Negative except for what is stated in the HPI  Blood pressure (!) 158/83, pulse 75, temperature 97.6 F (36.4 C), resp. rate 18, height 5\' 7"  (1.702 m), weight 51.3 kg, SpO2 99 %.   Physical Examination:  General: WD, thin female. Appears calm and comfortable in the bedside chair. Flat affect, expressionless HEENT:  Normocephalic, no lesions, without obvious  abnormality.  Normal external eye and conjunctiva on the right. Glaucoma on the right, slightly protruding, weepy right eye. Non-infectious looking.  Normal external ears and external nose, mucus membranes dry. Cardiovascular: Pulses palpable throughout   Pulmonary: Breathing comfortably on room air Abdomen: Soft, non-distended Skin: Visible skin warm and dry, no hyperpigmentation, vitiligo, or suspicious lesions  Neurological Examination:                                                                                               Mental Status: Sheila Andrade is alert, oriented x 4, thought content appropriate.  Speech fluent without evidence of aphasia. Able to follow 3-step commands without difficulty. Cranial Nerves: II: Visual fields grossly normal, pupils are equal, round, reactive to light bilaterally, sluggish on the right. III,IV, VI: Ptosis not present, extra-ocular muscle movements symmetrical V,VII: Smile and eyebrow raise is symmetric. Facial light touch symmetrical VIII: Hearing grossly intact IX,X: Uvula and palate rise symmetrically XI: SCM and shoulder shrug strength 5/5 bilaterally XII: Tongue with deviation to the left Motor: Right :     Upper extremity   5/5   Left:     Upper extremity   5/5          Lower extremity   5/5     Lower extremity   5/5 Pronator drift not present Tremor: Intermittent, low amplitude, low frequency tremor with occasional increases in frequency during reaching behaviors. Occurs both during action and rest. Mild paratonia and Cogwheel rigidity present bilaterally. Sensory: Pinprick and light touch intact throughout, bilaterally Deep Tendon Reflexes: 1+ and symmetric throughout upper limbs, dropped patellar reflexes --on attending examination she appeared to be somewhat hyperreflexic throughout, 3+ Cerebellar: Finger-to-nose test without evidence of dysmetria or ataxia. Coordinated rapid alternating movements.  Gait: Deferred   Lab Results: Basic  Metabolic Panel: Recent Labs  Lab 07/14/20 2025 07/15/20 0000 07/15/20 0331 07/16/20 0443  NA 130*  --  135 133*  K 4.2  --  3.9 4.2  CL 98  --  102 102  CO2 23  --  26 24  GLUCOSE 97  --  93 100*  BUN 16  --  12 21  CREATININE 0.60  --  0.56 0.69  CALCIUM 9.4  --  9.3 9.2  MG  --  2.1 2.1  --   PHOS  --  4.0 3.5  --     Liver Function Tests: Recent Labs  Lab 07/14/20 2025 07/15/20 0331  AST 20 17  ALT 15 16  ALKPHOS 59 58  BILITOT 0.6 0.4  PROT 7.2 6.9  ALBUMIN 4.0 3.9   No results for input(s): LIPASE, AMYLASE in the last 168 hours. Recent Labs  Lab 07/14/20 2025  AMMONIA 28    CBC: Recent Labs  Lab 07/14/20 2025 07/15/20 0331  WBC 7.5 7.6  NEUTROABS 4.8 5.3  HGB 12.4 12.6  HCT 39.1 39.3  MCV 89.5 88.3  PLT 349 319    Cardiac Enzymes: Recent  Labs  Lab 07/15/20 0030  CKTOTAL 59    Lipid Panel: No results for input(s): CHOL, TRIG, HDL, CHOLHDL, VLDL, LDLCALC in the last 168 hours.  CBG: No results for input(s): GLUCAP in the last 168 hours.  Microbiology: Results for orders placed or performed during the hospital encounter of 07/14/20  Urine culture     Status: Abnormal (Preliminary result)   Collection Time: 07/14/20  6:49 PM   Specimen: Urine, Random  Result Value Ref Range Status   Specimen Description   Final    URINE, RANDOM Performed at [facility], [address] [place], [place] [address]    Special Requests   Final    NONE Performed at [facility], [address], [place], [place] [address]    Culture (A)  Final    >=100,000 COLONIES/mL GRAM NEGATIVE RODS CULTURE REINCUBATED FOR BETTER GROWTH Performed at [facility], [address] [place], [place] [address]    Report Status PENDING  Incomplete  Resp Panel by RT-PCR (Flu A&B, Covid) Nasopharyngeal Swab     Status: None   Collection Time: 07/14/20  8:25 PM   Specimen: Nasopharyngeal Swab; Nasopharyngeal(NP) swabs in vial  transport medium  Result Value Ref Range Status   SARS Coronavirus 2 by RT PCR NEGATIVE NEGATIVE Final    Comment: (NOTE) SARS-CoV-2 target nucleic acids are NOT DETECTED.  The SARS-CoV-2 RNA is generally detectable in upper respiratory specimens during the acute phase of infection. The lowest concentration of SARS-CoV-2 viral copies this assay can detect is 138 copies/mL. A negative result does not preclude SARS-Cov-2 infection and should not be used as the sole basis for treatment or other patient management decisions. A negative result may occur with  improper specimen collection/handling, submission of specimen other than nasopharyngeal swab, presence of viral mutation(s) within the areas targeted by this assay, and inadequate number of viral copies(<138 copies/mL). A negative result must be combined with clinical observations, patient history, and epidemiological information. The expected result is Negative.  Fact Sheet for Patients:  BloggerCourse.com  Fact Sheet for Healthcare Providers:  SeriousBroker.it  This test is no t yet approved or cleared by the [place] FDA and  has been authorized for detection and/or diagnosis of SARS-CoV-2 by FDA under an Emergency Use Authorization (EUA). This EUA will remain  in effect (meaning this test can be used) for the duration of the COVID-19 declaration under Section 564(b)(1) of the Act, 21 U.S.C.section 360bbb-3(b)(1), unless the authorization is terminated  or revoked sooner.       Influenza A by PCR NEGATIVE NEGATIVE Final   Influenza B by PCR NEGATIVE NEGATIVE Final    Comment: (NOTE) The Xpert Xpress SARS-CoV-2/FLU/RSV plus assay is intended as an aid in the diagnosis of influenza from Nasopharyngeal swab specimens and should not be used as a sole basis for treatment. Nasal washings and aspirates are unacceptable for Xpert Xpress SARS-CoV-2/FLU/RSV testing.  Fact  Sheet for Patients: BloggerCourse.com  Fact Sheet for Healthcare Providers: SeriousBroker.it  This test is not yet approved or cleared by the [place] FDA and has been authorized for detection and/or diagnosis of SARS-CoV-2 by FDA under an Emergency Use Authorization (EUA). This EUA will remain in effect (meaning this test can be used) for the duration of the COVID-19 declaration under Section 564(b)(1) of the Act, 21 U.S.C. section 360bbb-3(b)(1), unless the authorization is terminated or revoked.  Performed at [facility], [address] [place], [place]  [address]     Coagulation Studies: No results for input(s): LABPROT, INR in the last 72 hours.  Imaging: DG Chest 2 View  Result Date: 07/14/2020 CLINICAL DATA:  Failure to thrive, weight loss EXAM: CHEST - 2 VIEW COMPARISON:  Radiograph 05/31/2020 FINDINGS: Suspect a small calcified mediastinal lymph node seen on lateral radiograph. Remaining cardiomediastinal contours are unremarkable. Bandlike opacity in the right lung base, likely scarring given similar to prior. No consolidation, features of edema, pneumothorax, or effusion. Degenerative changes are present in the imaged spine and shoulders. Evidence of calcific tendinosis in the left shoulder. Left cervical carotid atherosclerosis. IMPRESSION: Suspect a small calcified mediastinal/hilar node. Can reflect sequela of prior granulomatous disease. No acute cardiopulmonary abnormality. Electronically Signed   By: Kreg Shropshire M.D.   On: 07/14/2020 20:56   MR BRAIN WO CONTRAST  Result Date: 07/14/2020 CLINICAL DATA:  Mental status change. EXAM: MRI HEAD WITHOUT CONTRAST TECHNIQUE: Multiplanar, multiecho pulse sequences of the brain and surrounding structures were obtained without intravenous contrast. COMPARISON:  None. FINDINGS: Brain: No acute infarction, acute hemorrhage, hydrocephalus, extra-axial collection  or mass lesion. Moderate scattered T2/FLAIR hyperintensities within the white matter, which are nonspecific but most likely related to chronic microvascular ischemic disease given patient age. Mild-to-moderate atrophy with ex vacuo ventricular dilation. The callosal angle is not acute and there is no crowding of sulci at the vertex. Dilated perivascular space in the inferior left basal ganglia. Small focus of susceptibility artifact in the lateral left cerebellum, most likely a prior microhemorrhage. Vascular: Major arterial flow voids are maintained skull base. Skull and upper cervical spine: Normal marrow signal. Sinuses/Orbits: Visualized sinuses are clear. No acute orbital findings. Other: Small left mastoid effusion. IMPRESSION: 1. No evidence of acute intracranial abnormality 2. Moderate chronic microvascular ischemic disease and mild-to-moderate atrophy. 3. Small left mastoid effusion. Electronically Signed   By: Feliberto Harts MD   On: 07/14/2020 20:11   DG Hip Unilat W or Wo Pelvis 2-3 Views Left  Result Date: 07/14/2020 CLINICAL DATA:  Failure to thrive and weight loss EXAM: DG HIP (WITH OR WITHOUT PELVIS) 2-3V LEFT COMPARISON:  01/01/2020 FINDINGS: Pelvic ring is intact. Old healed pubic rami fractures are noted on the left stable from prior CT examination. Degenerative changes of the hip joints are noted right greater than left. No acute fracture or dislocation in the left hip is noted. Some widening of the left sacroiliac joint is noted which appears more prominent than that seen on prior CT. IMPRESSION: Slight widening of the left sacroiliac joint which may be projectional in nature. Degenerative changes of the hip joints bilaterally right greater than left. No acute fracture is seen. Old fractures in the left pubic rami are seen. Electronically Signed   By: Alcide Clever M.D.   On: 07/14/2020 20:56    Impression:  Sheila Andrade is a 85 year old woman with a past medical history  significant for HTN and depression with suicidal ideation who presented to [facility] with severe confusion and weakness. The primary team has worked her up and treated her for UTI. Neurology was consulted due to reports of shuffling gait, tremor and freezing behavior, with family concerned patient would be unable to attend scheduled outpatient appointment due to her hospitalization.   Neurologic imaging on this visit was unremarkable except for small vessel disease and moderate atrophy. Sheila Andrade's B12 level was 226 which is suboptimal. Low B12 can cause neurologic issues that include confusion and unsteadiness with gait. Will supplement with  goal of > 400 to avoid complications of subclinical deficiency   On exam and by history, she does have some symptomology of parkinsonism (shuffling gait, masked facies, sleep disturbance, handwriting changes, paratonia, cogwheel rigidity) although some of these may be due to or complicated by her depression (which is often comorbid with parkinsonism), as well by treatment with an antipsychotic that affects dopamine receptors.  She lacks the cardinal nonmotor features of Parkinson's disease, reporting only constipation and denying loss of smell or REM sleep behavior disorder, though the latter may be unrecognized since she lives alone.  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  She has an outpatient neurology appointment next week, which I have counseled the family to keep for now.  Based on evaluation there, she may be considered for further studies such as DaTscan, olfactory testing and sleep study which could further clarify her diagnosis.  Given the severity of her motor symptoms, and the possibility of secondary drug-induced parkinsonism contributing, with worsening while she has been on Abilify per patient's report, recommend alternative management of her psychotic features if possible.  For example Pimavanserin  could be considered as it is specifically approved for use in Parkinson's disease; however, rrior to the availability of this medication, clozapine or Seroquel were frequently used in these populations and remain a more cost effective choice.  Due to her recent hallucinations and psychosis I hesitate to trial Sinemet at this time, especially since she is planned for discharge within the next few days and has close outpatient neurology follow-up in place.  Additionally Sinemet is typically not helpful in Parkinson's plus syndromes, and therefore trialing this medication could lead to additional harm without benefit   Plan:   - On attending evaluation, would not trial Sinemet at this time, please see impression above  - B12 supplement, goal >400  - Defer psychiatric treatment to psych team, however, suggest replacement of Abilify with clozapine or Seroquel if possible  - Continue planned outpatient neurology follow-up  - Due to multiple emergent consults occurring later in the day, neurology was not available to reach out to family but will plan to do so on 7/13  Bruna PotterJamie Aldridge PA-C [facility] [phone]  07/16/2020, 10:06 AM  Attending Neurologist's note:  I personally saw this patient, gathering history, performing a full neurologic examination, reviewing relevant labs, personally reviewing relevant imaging including MRI brain, and formulated the assessment and plan, adding the note above for completeness and clarity to accurately reflect my thoughts   Brooke DareSrishti Thereasa Iannello MD-PhD [facility] [phone]  Available 7 AM to 7 PM, outside these hours please contact Neurologist on call listed on AMION   Greater than 110 minutes were spent in direct care of this patient, independent of time spent by the nurse practitioner including review of history, and examination, and discussion with family, greater than 50% of which was at bedside

## 2020-07-16 NOTE — Progress Notes (Signed)
PROGRESS NOTE  Sheila Andrade  WHQ:[id number] DOB: 04/12/1935 DOA: 07/14/2020 PCP: Dalphine Handing, MD   Brief Narrative: Sheila Andrade is an 85 y.o. female with a history of depression who presented to the ED with siucidal ideation in the setting of continued functional decline, debility associated with tremors, bradykinesia. Urinalysis suggestive of UTI for which ceftriaxone was initially provided though with no reported symptoms, stopped awaiting urine culture. Due to [facility], psychiatry consulted initially recommending inpatient psychiatric stabilization, but the patient is no longer suicidal and committed to rehabilitation. SNF is recommended based on current level of functioning, and is being pursued. Neurology is consulted for possible movement disorder.   Assessment & Plan: Principal Problem:   MDD (major depressive disorder), recurrent episode, severe (HCC) Active Problems:   Weight loss   Anemia   UTI (urinary tract infection)   Hyponatremia   Suicidal ideations   Dehydration  Severe MDD: Suicidal ideation resolved.  - Appreciate psychiatry recommendations. We will DC sitter/suicide precautions.  - Continue effexor, continue abilify with plans to titrate upward on dose. Monitor neurological exam as well, as parkinsonism would be expected to worsen with this dopamine blockade.   Weight loss, failure to thrive, dehydration: Functionally worsening since fall/pelvic fracture in January 2022 after which she participated in rehabilitation then sent home to [facility] and came to live with son and DIL in [place].  - Dietitian consulted - Will need to restart IV fluids with continued very poor per oral intake. Will monitor metabolic profile.   ?Movement disorder: Several overlapping clinical features between Parkinsonian disorder and severe depression/pseudodementia.  - Neurology consult appreciated. D/w Dr. Iver Nestle this morning who plans to refer to [facility] as outpatient for follow up in weeks,  possibly start low dose sinemet. Will need to monitor for resurgence of psychotic features with this.  - MR brain demonstrated moderate microvascular ischemic disease, mild-moderate atrophy, but no acute abnormalities.  Abnormal urinalysis: Nitrite positive but no pyuria? - We've stopped CTX and will monitor for emergence of symptoms.   DVT prophylaxis: SCDs Code Status: Full Family Communication: Daughter-in-law (who is a NP) at bedside Disposition Plan:  Status is: Inpatient  Remains inpatient appropriate because:Altered mental status, Ongoing diagnostic testing needed not appropriate for outpatient work up, and IV treatments appropriate due to intensity of illness or inability to take PO  Dispo: The patient is from: Home              Anticipated d/c is to: SNF              Patient currently is not medically stable to d/c.   Difficult to place patient No  Consultants:  Psychiatry Neurology  Procedures:  None  Antimicrobials: Ceftriaxone   Subjective: Pt without pain, no urinary complaints. No dyspnea or chest pain.   Objective: Vitals:   07/15/20 1526 07/15/20 2040 07/16/20 0431 07/16/20 1633  BP:  131/71 (!) 158/83 (!) 154/85  Pulse:  82 75 86  Resp:  18 18 18   Temp:  98.2 F (36.8 C) 97.6 F (36.4 C) 98.2 F (36.8 C)  TempSrc:    Oral  SpO2: 96% 97% 99% 98%  Weight:      Height:        Intake/Output Summary (Last 24 hours) at 07/16/2020 1950 Last data filed at 07/16/2020 1500 Gross per 24 hour  Intake 774.52 ml  Output 350 ml  Net 424.52 ml   Filed Weights   07/14/20 1755  Weight: 51.3 kg  Gen: Elderly, frail female in no distress  Pulm: Non-labored breathing room air.  CV: Regular rate and rhythm. No JVD, no pedal edema. Ext: Warm, dry. Hammer toe deformities bilaterally. Skin: No rashes, lesions or ulcers on visualized skin Neuro: Alert and oriented. Narrow based gait without resting tremor. Bradykinetic without cogwheel rigidity. MAEW Psych:  Judgement and insight appear intact. Calm. Not currently endorsing plan to harm herself.   Data Reviewed: I have personally reviewed following labs and imaging studies  CBC: Recent Labs  Lab 07/14/20 2025 07/15/20 0331  WBC 7.5 7.6  NEUTROABS 4.8 5.3  HGB 12.4 12.6  HCT 39.1 39.3  MCV 89.5 88.3  PLT 349 319   Basic Metabolic Panel: Recent Labs  Lab 07/14/20 2025 07/15/20 0000 07/15/20 0331 07/16/20 0443  NA 130*  --  135 133*  K 4.2  --  3.9 4.2  CL 98  --  102 102  CO2 23  --  26 24  GLUCOSE 97  --  93 100*  BUN 16  --  12 21  CREATININE 0.60  --  0.56 0.69  CALCIUM 9.4  --  9.3 9.2  MG  --  2.1 2.1  --   PHOS  --  4.0 3.5  --    GFR: Estimated Creatinine Clearance: 43.9 mL/min (by C-G formula based on SCr of 0.69 mg/dL). Liver Function Tests: Recent Labs  Lab 07/14/20 2025 07/15/20 0331  AST 20 17  ALT 15 16  ALKPHOS 59 58  BILITOT 0.6 0.4  PROT 7.2 6.9  ALBUMIN 4.0 3.9   No results for input(s): LIPASE, AMYLASE in the last 168 hours. Recent Labs  Lab 07/14/20 2025  AMMONIA 28   Coagulation Profile: No results for input(s): INR, PROTIME in the last 168 hours. Cardiac Enzymes: Recent Labs  Lab 07/15/20 0030  CKTOTAL 59   BNP (last 3 results) No results for input(s): PROBNP in the last 8760 hours. HbA1C: No results for input(s): HGBA1C in the last 72 hours. CBG: No results for input(s): GLUCAP in the last 168 hours. Lipid Profile: No results for input(s): CHOL, HDL, LDLCALC, TRIG, CHOLHDL, LDLDIRECT in the last 72 hours. Thyroid Function Tests: Recent Labs    07/14/20 2025  TSH 1.430   Anemia Panel: Recent Labs    07/15/20 0331  VITAMINB12 226  FOLATE 12.7  FERRITIN 156  TIBC 318  IRON 57  RETICCTPCT 1.0   Urine analysis:    Component Value Date/Time   COLORURINE YELLOW 07/14/2020 1849   APPEARANCEUR CLOUDY (A) 07/14/2020 1849   LABSPEC 1.014 07/14/2020 1849   LABSPEC 1.020 05/27/2020 1541   PHURINE 7.0 07/14/2020 1849    GLUCOSEU NEGATIVE 07/14/2020 1849   HGBUR NEGATIVE 07/14/2020 1849   BILIRUBINUR NEGATIVE 07/14/2020 1849   BILIRUBINUR negative 05/27/2020 1541   KETONESUR NEGATIVE 07/14/2020 1849   PROTEINUR NEGATIVE 07/14/2020 1849   NITRITE POSITIVE (A) 07/14/2020 1849   LEUKOCYTESUR NEGATIVE 07/14/2020 1849   Recent Results (from the past 240 hour(s))  Urine culture     Status: Abnormal (Preliminary result)   Collection Time: 07/14/20  6:49 PM   Specimen: Urine, Random  Result Value Ref Range Status   Specimen Description   Final    URINE, RANDOM Performed at [facility], [address] [place], [place] [address]    Special Requests   Final    NONE Performed at [facility], [address], [place], [place] [address]    Culture (A)  Final    >=100,000 COLONIES/mL KLEBSIELLA ORNITHINOLYTICA >=100,000 COLONIES/mL KLEBSIELLA OXYTOCA SUSCEPTIBILITIES TO FOLLOW Performed at [facility], [address], [place], [place] [address]    Report Status PENDING  Incomplete  Resp Panel by RT-PCR (Flu A&B, Covid) Nasopharyngeal Swab     Status: None   Collection Time: 07/14/20  8:25 PM   Specimen: Nasopharyngeal Swab; Nasopharyngeal(NP) swabs in vial transport medium  Result Value Ref Range Status   SARS Coronavirus 2 by RT PCR NEGATIVE NEGATIVE Final    Comment: (NOTE) SARS-CoV-2 target nucleic acids are NOT DETECTED.  The SARS-CoV-2 RNA is generally detectable in upper respiratory specimens during the acute phase of infection. The lowest concentration of SARS-CoV-2 viral copies this assay can detect is 138 copies/mL. A negative result does not preclude SARS-Cov-2 infection and should not be used as the sole basis for treatment or other patient management decisions. A negative result may occur with  improper specimen collection/handling, submission of specimen other than nasopharyngeal swab, presence of viral mutation(s) within the areas targeted by  this assay, and inadequate number of viral copies(<138 copies/mL). A negative result must be combined with clinical observations, patient history, and epidemiological information. The expected result is Negative.  Fact Sheet for Patients:  BloggerCourse.com  Fact Sheet for Healthcare Providers:  SeriousBroker.it  This test is no t yet approved or cleared by the [place] FDA and  has been authorized for detection and/or diagnosis of SARS-CoV-2 by FDA under an Emergency Use Authorization (EUA). This EUA will remain  in effect (meaning this test can be used) for the duration of the COVID-19 declaration under Section 564(b)(1) of the Act, 21 U.S.C.section 360bbb-3(b)(1), unless the authorization is terminated  or revoked sooner.       Influenza A by PCR NEGATIVE NEGATIVE Final   Influenza B by PCR NEGATIVE NEGATIVE Final    Comment: (NOTE) The Xpert Xpress SARS-CoV-2/FLU/RSV plus assay is intended as an aid in the diagnosis of influenza from Nasopharyngeal swab specimens and should not be used as a sole basis for treatment. Nasal washings and aspirates are unacceptable for Xpert Xpress SARS-CoV-2/FLU/RSV testing.  Fact Sheet for Patients: BloggerCourse.com  Fact Sheet for Healthcare Providers: SeriousBroker.it  This test is not yet approved or cleared by the [place] FDA and has been authorized for detection and/or diagnosis of SARS-CoV-2 by FDA under an Emergency Use Authorization (EUA). This EUA will remain in effect (meaning this test can be used) for the duration of the COVID-19 declaration under Section 564(b)(1) of the Act, 21 U.S.C. section 360bbb-3(b)(1), unless the authorization is terminated or revoked.  Performed at [facility], [address], [place], [place] [address]       Radiology Studies: DG Chest 2 View  Result Date:  07/14/2020 CLINICAL DATA:  Failure to thrive, weight loss EXAM: CHEST - 2 VIEW COMPARISON:  Radiograph 05/31/2020 FINDINGS: Suspect a small calcified mediastinal lymph node seen on lateral radiograph. Remaining cardiomediastinal contours are unremarkable. Bandlike opacity in the right lung base, likely scarring given similar to prior. No consolidation, features of edema, pneumothorax, or effusion. Degenerative changes are present in the imaged spine and shoulders. Evidence of calcific tendinosis in the left shoulder. Left cervical carotid atherosclerosis. IMPRESSION: Suspect a small calcified mediastinal/hilar node. Can reflect sequela of prior granulomatous disease. No acute cardiopulmonary abnormality. Electronically Signed   By: Kreg Shropshire M.D.   On: 07/14/2020 20:56   DG Hip Unilat W or Wo Pelvis 2-3 Views Left  Result Date: 07/14/2020 CLINICAL DATA:  Failure to thrive and weight loss EXAM: DG HIP (WITH OR WITHOUT PELVIS) 2-3V LEFT COMPARISON:  01/01/2020 FINDINGS: Pelvic ring is intact. Old healed pubic rami fractures are noted on the left stable from prior CT examination. Degenerative changes of the hip joints are noted right greater than left. No acute fracture or dislocation in the left hip is noted. Some widening of the left sacroiliac joint is noted which appears more prominent than that seen on prior CT. IMPRESSION: Slight widening of the left sacroiliac joint which may be projectional in nature. Degenerative changes of the hip joints bilaterally right greater than left. No acute fracture is seen. Old fractures in the left pubic rami are seen. Electronically Signed   By: Alcide CleverMark  Lukens M.D.   On: 07/14/2020 20:56    Scheduled Meds:  [START ON 07/17/2020] ARIPiprazole  5 mg Oral Daily   [START ON 07/19/2020] ARIPiprazole  7.5 mg Oral Daily   brinzolamide  1 drop Left Eye BID   feeding supplement  237 mL Oral BID BM   latanoprost  1 drop Left Eye QHS   simvastatin  20 mg Oral Daily   traZODone   25 mg Oral QHS   [START ON 07/19/2020] venlafaxine XR  150 mg Oral Q breakfast   [START ON 07/17/2020] venlafaxine XR  75 mg Oral q morning   vitamin B-12  50 mcg Oral Daily   Continuous Infusions:  lactated ringers 75 mL/hr at 07/16/20 1152     LOS: 0 days   Time spent: 25 minutes.  Tyrone Nineyan B Yalissa Fink, MD [facility] [email or web address] 07/16/2020, 7:50 PM

## 2020-07-16 NOTE — TOC Initial Note (Signed)
Transition of Care [facility]) - Initial/Assessment Note    Patient Details  Name: Sheila Andrade MRN: [id number] Date of Birth: 26-Jan-1935  Transition of Care [facility]) CM/SW Contact:    Ida Rogue, LCSW Phone Number: 07/16/2020, 3:16 PM  Clinical Narrative:   Patient seen in follow up to PT recommendation of SNF. With Ms Demonbreun in the room was her daughter in law [name], with whom patient has been staying since she got out of short term rehab back in March after sustaining broken hip and subsequent surgical repair.  She was doing fine with [facility] services until this recent medical episode, and she is now weak and had a brief acute mental health crises, now stabilized.  Ms Woolen is interested in going into rehab again, with the ultimate goal of being able to return to her home in [place], immediate goal of returning to current living situation in [place].  Bed search process explained.  Bed search initiated. TOC will continue to follow during the course of hospitalization.               Expected Discharge Plan: Skilled Nursing Facility Barriers to Discharge: SNF Pending bed offer   Patient Goals and CMS Choice     Choice offered to / list presented to : Patient, Adult Children  Expected Discharge Plan and Services Expected Discharge Plan: Skilled Nursing Facility   Discharge Planning Services: CM Consult Post Acute Care Choice: Skilled Nursing Facility Living arrangements for the past 2 months: Single Family Home                                      Prior Living Arrangements/Services Living arrangements for the past 2 months: Single Family Home Lives with:: Adult Children Patient language and need for interpreter reviewed:: Yes        Need for Family Participation in Patient Care: Yes (Comment) Care giver support system in place?: Yes (comment) Current home services: DME Criminal Activity/Legal Involvement Pertinent to Current Situation/Hospitalization: No -  Comment as needed  Activities of Daily Living Home Assistive Devices/Equipment: Walker (specify type) ADL Screening (condition at time of admission) Patient's cognitive ability adequate to safely complete daily activities?: No Is the patient deaf or have difficulty hearing?: No Does the patient have difficulty seeing, even when wearing glasses/contacts?: No Does the patient have difficulty concentrating, remembering, or making decisions?: No Patient able to express need for assistance with ADLs?: Yes Does the patient have difficulty dressing or bathing?: Yes Independently performs ADLs?: No Communication: Independent Dressing (OT): Needs assistance Is this a change from baseline?: Pre-admission baseline Grooming: Needs assistance Is this a change from baseline?: Pre-admission baseline Feeding: Independent Bathing: Needs assistance Is this a change from baseline?: Pre-admission baseline Toileting: Needs assistance Is this a change from baseline?: Pre-admission baseline In/Out Bed: Needs assistance Is this a change from baseline?: Pre-admission baseline Walks in Home: Needs assistance Is this a change from baseline?: Pre-admission baseline Does the patient have difficulty walking or climbing stairs?: Yes Weakness of Legs: Both Weakness of Arms/Hands: Both  Permission Sought/Granted Permission sought to share information with : Family Supports Permission granted to share information with : Yes, Verbal Permission Granted  Share Information with NAME: [name],[name] Daughter in law     [phone], Jodell, Weitman (Daughter)   [phone]           Emotional Assessment Appearance:: Appears stated age Attitude/Demeanor/Rapport: Engaged Affect (typically  observed): Constricted Orientation: : Oriented to Self, Oriented to Place, Oriented to Situation Alcohol / Substance Use: Not Applicable Psych Involvement: Yes (comment) (depression)  Admission diagnosis:  Suicidal ideation  [R45.851] Lower urinary tract infectious disease [N39.0] UTI (urinary tract infection) [N39.0] Fatigue, unspecified type [R53.83] Dehydration [E86.0] Patient Active Problem List   Diagnosis Date Noted   Dehydration 07/16/2020   Hyponatremia 07/15/2020   Suicidal ideations 07/15/2020   MDD (major depressive disorder), recurrent episode, severe (HCC) 07/15/2020   UTI (urinary tract infection) 07/14/2020   Hot flashes 06/06/2020   Fever 05/27/2020   Weight loss 05/27/2020   Tinea corporis 05/27/2020   Anemia 05/27/2020   PCP:  Dalphine Handing, MD Pharmacy:   [facility] [phone] - [place], [place] - [address] [place] [address] Phone: [phone] Fax: [phone]     Social Determinants of Health (SDOH) Interventions    Readmission Risk Interventions No flowsheet data found.

## 2020-07-17 DIAGNOSIS — E871 Hypo-osmolality and hyponatremia: Secondary | ICD-10-CM | POA: Diagnosis not present

## 2020-07-17 DIAGNOSIS — R45851 Suicidal ideations: Secondary | ICD-10-CM | POA: Diagnosis not present

## 2020-07-17 DIAGNOSIS — F332 Major depressive disorder, recurrent severe without psychotic features: Secondary | ICD-10-CM | POA: Diagnosis not present

## 2020-07-17 DIAGNOSIS — R634 Abnormal weight loss: Secondary | ICD-10-CM | POA: Diagnosis not present

## 2020-07-17 LAB — URINE CULTURE: Culture: >=100000 — AB

## 2020-07-17 LAB — BASIC METABOLIC PANEL
Anion gap: 7 (ref 5–15)
BUN: 16 mg/dL (ref 8–23)
CO2: 24 mmol/L (ref 22–32)
Calcium: 9.2 mg/dL (ref 8.9–10.3)
Chloride: 100 mmol/L (ref 98–111)
Creatinine, Ser: 0.53 mg/dL (ref 0.44–1.00)
GFR, Estimated: >60 mL/min (ref >60)
Glucose, Bld: 85 mg/dL (ref 70–99)
Potassium: 4.5 mmol/L (ref 3.5–5.1)
Sodium: 131 mmol/L — ABNORMAL LOW (ref 135–145)

## 2020-07-17 MED ORDER — CYANOCOBALAMIN 1000 MCG/ML IJ SOLN
1000.0000 ug | Freq: Once | INTRAMUSCULAR | Status: AC
  Administered 2020-07-17: 1000 ug via INTRAMUSCULAR
  Filled 2020-07-17: qty 1

## 2020-07-17 MED ORDER — ADULT MULTIVITAMIN W/MINERALS CH
1.0000 | ORAL_TABLET | Freq: Every day | ORAL | Status: DC
  Administered 2020-07-17 – 2020-08-02 (×17): 1 via ORAL
  Filled 2020-07-17 (×16): qty 1

## 2020-07-17 NOTE — TOC Progression Note (Signed)
Transition of Care [facility]) - Progression Note    Patient Details  Name: Sheila Andrade MRN: [id number] Date of Birth: [birth year]  [facility]) CM/SW Contact  Ida Rogue, [place] Phone Number: 07/17/2020, 10:42 AM  Clinical Narrative:   SS# received today.  Request for PASSR submitted.  No bed offers so far.  Family is hoping for [employer].  Spoke with [employer]. Referral Faxed. TOC will continue to follow during the course of hospitalization.     Expected Discharge Plan: Skilled Nursing Facility Barriers to Discharge: SNF Pending bed offer  Expected Discharge Plan and Services Expected Discharge Plan: Skilled Nursing Facility   Discharge Planning Services: CM Consult Post Acute Care Choice: Skilled Nursing Facility Living arrangements for the past 2 months: Single Family Home                                       Social Determinants of Health (SDOH) Interventions    Readmission Risk Interventions No flowsheet data found.

## 2020-07-17 NOTE — Progress Notes (Signed)
Physical Therapy Treatment Patient Details Name: Sheila Andrade MRN: [id number] DOB: 1935-11-17 Today's Date: 07/17/2020    History of Present Illness 85 y.o. female adm with recurrent UTI PMH:  recent COVID, glaucoma osteoporosis, HLD, depression . Pysch consulted    PT Comments    Patient making some progress with mobility but continues to require Mod assist for tranfers will +2 for safety. Exercises focused on neuro-re-ed to facilitate anterior trunk lean for improved power up and standing balance. Pt fatigued at EOS and returned to supine in bed. She will benefit from continued skilled PT interventions to progress mobility.    Follow Up Recommendations  SNF     Equipment Recommendations  None recommended by PT    Recommendations for Other Services       Precautions / Restrictions Precautions Precautions: Fall Restrictions Weight Bearing Restrictions: No    Mobility  Bed Mobility Overal bed mobility: Needs Assistance Bed Mobility: Sit to Supine       Sit to supine: Min assist   General bed mobility comments: pt OOB in recliner at start. Returned to bed EOS.    Transfers Overall transfer level: Needs assistance Equipment used: Rolling walker (2 wheeled) Transfers: Stand Pivot Transfers;Sit to/from Stand Sit to Stand: Mod assist;+2 safety/equipment Stand pivot transfers: Mod assist;+2 safety/equipment       General transfer comment: pt with significant posterior lean. mod+2 assist to rise and cues to facilitate anterior lean. Pt completed stand step transfer chair<>BSC. She then completed 3x sit<>stand after exercise to facilitate anterior lean (also tactile cue of pt leaning on standard chair for press up to facilitate anterior weigth shift.  Ambulation/Gait Ambulation/Gait assistance: Mod assist;+2 safety/equipment Gait Distance (Feet): 8 Feet Assistive device: Rolling walker (2 wheeled) Gait Pattern/deviations: Step-through pattern;Decreased stride  length;Shuffle;Narrow base of support Gait velocity: decr   General Gait Details: assist with balance and wt shift, visual cues for step length (therapists shoe0   Stairs             Wheelchair Mobility    Modified Rankin (Stroke Patients Only)       Balance Overall balance assessment: Needs assistance Sitting-balance support: Feet supported;Bilateral upper extremity supported Sitting balance-Leahy Scale: Poor     Standing balance support: Bilateral upper extremity supported Standing balance-Leahy Scale: Poor Standing balance comment: reliant on UEs and external assist                            Cognition Arousal/Alertness: Awake/alert Behavior During Therapy: Flat affect Overall Cognitive Status: Impaired/Different from baseline Area of Impairment: Following commands;Safety/judgement;Problem solving                       Following Commands: Follows one step commands with increased time Safety/Judgement: Decreased awareness of safety   Problem Solving: Slow processing;Decreased initiation;Difficulty sequencing;Requires verbal cues;Requires tactile cues General Comments: per daughter in law patient has doctorate in education, has published books most recent ~2 years ago and loved travelling. started noticing changes in mentation more noticably since fall and pelvic fractures in Jan.      Exercises Other Exercises Other Exercises: 10 reps anterior trunk lean in sitting: pt reaching bil UE's forward on standard chair seat.    General Comments        Pertinent Vitals/Pain Pain Assessment: Faces Faces Pain Scale: No hurt Pain Intervention(s): Monitored during session    Home Living  Prior Function            PT Goals (current goals can now be found in the care plan section) Acute Rehab PT Goals Patient Stated Goal: "figure out what is going on" PT Goal Formulation: With patient Time For Goal Achievement:  07/29/20 Potential to Achieve Goals: Fair Progress towards PT goals: Progressing toward goals    Frequency    Min 2X/week      PT Plan Current plan remains appropriate    Co-evaluation              AM-PAC PT "6 Clicks" Mobility   Outcome Measure  Help needed turning from your back to your side while in a flat bed without using bedrails?: A Little Help needed moving from lying on your back to sitting on the side of a flat bed without using bedrails?: A Little Help needed moving to and from a bed to a chair (including a wheelchair)?: A Lot Help needed standing up from a chair using your arms (e.g., wheelchair or bedside chair)?: A Lot Help needed to walk in hospital room?: A Lot Help needed climbing 3-5 steps with a railing? : A Lot 6 Click Score: 14    End of Session Equipment Utilized During Treatment: Gait belt Activity Tolerance: Patient tolerated treatment well Patient left: with call bell/phone within reach;in chair;with chair alarm set;with nursing/sitter in room Nurse Communication: Mobility status PT Visit Diagnosis: Other abnormalities of gait and mobility (R26.89);Difficulty in walking, not elsewhere classified (R26.2)     Time: 6387-5643 PT Time Calculation (min) (ACUTE ONLY): 32 min  Charges:  $Therapeutic Activity: 8-22 mins $Neuromuscular Re-education: 8-22 mins                     Wynn Maudlin, DPT Acute Rehabilitation Services Office [phone] Pager [phone]    Anitra Lauth 07/17/2020, 5:56 PM

## 2020-07-17 NOTE — Progress Notes (Addendum)
Patient ID: Sheila Andrade, female   DOB: 05/12/1935, 85 y.o.   MRN: [id number]  PROGRESS NOTE    Sheila Andrade  [id number] DOB: 05/12/1935 DOA: 07/14/2020 PCP: Dalphine HandingShah, Akia Rose, MD   Brief Narrative:  85 y.o. female with a history of depression who presented to the ED with siucidal ideation in the setting of continued functional decline, debility associated with tremors, bradykinesia.  She was initially started on Rocephin for possible UTI but subsequently stopped.  Psychiatry initially recommended inpatient psychiatric hospitalization but subsequently patient was no longer suicidal and hence psychiatry recommended outpatient follow-up.  Neurology was also consulted for possible movement disorder.  Assessment & Plan:   Severe depression Suicidal ideation: Resolved - Psychiatry initially recommended inpatient psychiatric hospitalization but subsequently patient was no longer suicidal and hence psychiatry recommended outpatient follow-up. -Suicide precautions/sitter have been discontinued -Currently on Effexor and Abilify.  Neurology is recommending Seroquel or clozapine instead of Abilify but deferring it to psychiatry  Weight loss, failure to thrive, dehydration -Functionally worsening since fall/pelvic fracture in January 2022 which required rehab following which she has been living with son and daughter-in-law in [place] -Oral intake still poor.  Follow nutrition recommendations -Encourage oral intake.  Oral intake improving but not that good yet.  Decrease normal saline to 50 cc an hour.  Mild hyponatremia -Continue IV fluids as above.  Monitor  ?  Movement disorder - Several overlapping clinical features between Parkinsonian disorder and severe depression/pseudodementia. -Neurology evaluation appreciated: Neurology recommends to maintain vitamin B12 above 400.  Vitamin B12 was 254 on presentation.  Will continue supplementation.  Outpatient follow-up with neurology; might need to  start Sinemet as an outpatient -MRI brain showed moderate microvascular ischemic disease, mild to moderate atrophy, no acute abnormalities  Abnormal urinalysis -No pyuria; Rocephin has been stopped  DVT prophylaxis: SCDs Code Status: Full Family Communication: Daughter-in-law at bedside Disposition Plan: Status is: Inpatient  Remains inpatient appropriate because:Inpatient level of care appropriate due to severity of illness  Dispo: The patient is from: Home              Anticipated d/c is to: SNF              Patient currently is medically stable to d/c.   Difficult to place patient No   Consultants: Neurology/psychiatry  Procedures: None  Antimicrobials:  Anti-infectives (From admission, onward)    Start     Dose/Rate Route Frequency Ordered Stop   07/15/20 2000  cefTRIAXone (ROCEPHIN) 1 g in sodium chloride 0.9 % 100 mL IVPB  Status:  Discontinued        1 g 200 mL/hr over 30 Minutes Intravenous Every 24 hours 07/15/20 0000 07/15/20 1635   07/14/20 2315  cefTRIAXone (ROCEPHIN) 1 g in sodium chloride 0.9 % 100 mL IVPB        1 g 200 mL/hr over 30 Minutes Intravenous  Once 07/14/20 2307 07/15/20 0040        Subjective: Patient seen and examined at bedside.  Daughter-in-law present at bedside.  Oral intake slightly improved as per daughter-in-law.  Patient states that her oral intake is not that good yet.  No overnight fever, vomiting or worsening shortness of breath reported  Objective: Vitals:   07/16/20 1633 07/16/20 1938 07/17/20 0354 07/17/20 1213  BP: (!) 154/85 (!) 148/76 (!) 142/81 (!) 153/86  Pulse: 86 76 76 86  Resp: 18 16 15 16   Temp: 98.2 F (36.8 C) 98.4 F (36.9 C) 98.2 F (36.8 C) 97.9  F (36.6 C)  TempSrc: Oral Oral Oral Oral  SpO2: 98% 96% 97% 98%  Weight:      Height:        Intake/Output Summary (Last 24 hours) at 07/17/2020 1320 Last data filed at 07/17/2020 0446 Gross per 24 hour  Intake 354.52 ml  Output 400 ml  Net -45.48 ml    Filed Weights   07/14/20 1755  Weight: 51.3 kg    Examination:  General exam: No acute distress.  Looks chronically ill and deconditioned.  Currently on room air.   Respiratory system: Decreased bilateral breath sounds at bases with no wheezing  cardiovascular system: Rate controlled, S1-S2 heard  gastrointestinal system: Abdomen is slightly distended, soft and nontender.  Bowel sounds are heard Extremities: No clubbing or lower extremity edema Central nervous system: Awake and alert.  No focal neurological deficits.  Moves extremities.   Skin: No obvious ecchymosis/lesions Psychiatry: Affect is flat and intermittently gets anxious but   Data Reviewed: I have personally reviewed following labs and imaging studies  CBC: Recent Labs  Lab 07/14/20 2025 07/15/20 0331  WBC 7.5 7.6  NEUTROABS 4.8 5.3  HGB 12.4 12.6  HCT 39.1 39.3  MCV 89.5 88.3  PLT 349 319   Basic Metabolic Panel: Recent Labs  Lab 07/14/20 2025 07/15/20 0000 07/15/20 0331 07/16/20 0443 07/17/20 0451  NA 130*  --  135 133* 131*  K 4.2  --  3.9 4.2 4.5  CL 98  --  102 102 100  CO2 23  --  26 24 24   GLUCOSE 97  --  93 100* 85  BUN 16  --  12 21 16   CREATININE 0.60  --  0.56 0.69 0.53  CALCIUM 9.4  --  9.3 9.2 9.2  MG  --  2.1 2.1  --   --   PHOS  --  4.0 3.5  --   --    GFR: Estimated Creatinine Clearance: 43.9 mL/min (by C-G formula based on SCr of 0.53 mg/dL). Liver Function Tests: Recent Labs  Lab 07/14/20 2025 07/15/20 0331  AST 20 17  ALT 15 16  ALKPHOS 59 58  BILITOT 0.6 0.4  PROT 7.2 6.9  ALBUMIN 4.0 3.9   No results for input(s): LIPASE, AMYLASE in the last 168 hours. Recent Labs  Lab 07/14/20 2025  AMMONIA 28   Coagulation Profile: No results for input(s): INR, PROTIME in the last 168 hours. Cardiac Enzymes: Recent Labs  Lab 07/15/20 0030  CKTOTAL 59   BNP (last 3 results) No results for input(s): PROBNP in the last 8760 hours. HbA1C: No results for input(s):  HGBA1C in the last 72 hours. CBG: No results for input(s): GLUCAP in the last 168 hours. Lipid Profile: No results for input(s): CHOL, HDL, LDLCALC, TRIG, CHOLHDL, LDLDIRECT in the last 72 hours. Thyroid Function Tests: Recent Labs    07/14/20 2025  TSH 1.430   Anemia Panel: Recent Labs    07/15/20 0331  VITAMINB12 226  FOLATE 12.7  FERRITIN 156  TIBC 318  IRON 57  RETICCTPCT 1.0   Sepsis Labs: Recent Labs  Lab 07/14/20 2025 07/15/20 0105  LATICACIDVEN 1.1 0.6    Recent Results (from the past 240 hour(s))  Urine culture     Status: Abnormal   Collection Time: 07/14/20  6:49 PM   Specimen: Urine, Random  Result Value Ref Range Status   Specimen Description   Final    URINE, RANDOM Performed at [facility], [address] [place], [place] [address]    Special Requests   Final    NONE Performed at [facility], [address], [place], [place] [address]    Culture (A)  Final    >=100,000 COLONIES/mL KLEBSIELLA ORNITHINOLYTICA >=100,000 COLONIES/mL KLEBSIELLA OXYTOCA    Report Status 07/17/2020 FINAL  Final   Organism ID, Bacteria KLEBSIELLA ORNITHINOLYTICA (A)  Final   Organism ID, Bacteria KLEBSIELLA OXYTOCA (A)  Final      Susceptibility   Klebsiella ornithinolytica - MIC*    AMPICILLIN >=32 RESISTANT Resistant     CEFAZOLIN 16 SENSITIVE Sensitive     CEFEPIME <=0.12 SENSITIVE Sensitive     CEFTRIAXONE <=0.25 SENSITIVE Sensitive     CIPROFLOXACIN <=0.25 SENSITIVE Sensitive     GENTAMICIN <=1 SENSITIVE Sensitive     IMIPENEM 0.5 SENSITIVE Sensitive     NITROFURANTOIN 32 SENSITIVE Sensitive     TRIMETH/SULFA <=20 SENSITIVE Sensitive     AMPICILLIN/SULBACTAM 16 INTERMEDIATE Intermediate     PIP/TAZO <=4 SENSITIVE Sensitive     * >=100,000 COLONIES/mL KLEBSIELLA ORNITHINOLYTICA   Klebsiella oxytoca - MIC*    AMPICILLIN RESISTANT Resistant     CEFAZOLIN <=4 SENSITIVE Sensitive     CEFEPIME <=0.12 SENSITIVE Sensitive      CEFTRIAXONE <=0.25 SENSITIVE Sensitive     CIPROFLOXACIN <=0.25 SENSITIVE Sensitive     GENTAMICIN <=1 SENSITIVE Sensitive     IMIPENEM <=0.25 SENSITIVE Sensitive     NITROFURANTOIN <=16 SENSITIVE Sensitive     TRIMETH/SULFA <=20 SENSITIVE Sensitive     AMPICILLIN/SULBACTAM <=2 SENSITIVE Sensitive     PIP/TAZO <=4 SENSITIVE Sensitive     * >=100,000 COLONIES/mL KLEBSIELLA OXYTOCA  Resp Panel by RT-PCR (Flu A&B, Covid) Nasopharyngeal Swab     Status: None   Collection Time: 07/14/20  8:25 PM   Specimen: Nasopharyngeal Swab; Nasopharyngeal(NP) swabs in vial transport medium  Result Value Ref Range Status   SARS Coronavirus 2 by RT PCR NEGATIVE NEGATIVE Final    Comment: (NOTE) SARS-CoV-2 target nucleic acids are NOT DETECTED.  The SARS-CoV-2 RNA is generally detectable in upper respiratory specimens during the acute phase of infection. The lowest concentration of SARS-CoV-2 viral copies this assay can detect is 138 copies/mL. A negative result does not preclude SARS-Cov-2 infection and should not be used as the sole basis for treatment or other patient management decisions. A negative result may occur with  improper specimen collection/handling, submission of specimen other than nasopharyngeal swab, presence of viral mutation(s) within the areas targeted by this assay, and inadequate number of viral copies(<138 copies/mL). A negative result must be combined with clinical observations, patient history, and epidemiological information. The expected result is Negative.  Fact Sheet for Patients:  BloggerCourse.com  Fact Sheet for Healthcare Providers:  SeriousBroker.it  This test is no t yet approved or cleared by the [place] FDA and  has been authorized for detection and/or diagnosis of SARS-CoV-2 by FDA under an Emergency Use Authorization (EUA). This EUA will remain  in effect (meaning this test can be used) for the  duration of the COVID-19 declaration under Section 564(b)(1) of the Act, 21 U.S.C.section 360bbb-3(b)(1), unless the authorization is terminated  or revoked sooner.       Influenza A by PCR NEGATIVE NEGATIVE Final   Influenza B by PCR NEGATIVE NEGATIVE Final    Comment: (NOTE) The Xpert Xpress SARS-CoV-2/FLU/RSV plus assay is intended as an aid in the diagnosis of influenza from Nasopharyngeal swab specimens and should not be  used as a sole basis for treatment. Nasal washings and aspirates are unacceptable for Xpert Xpress SARS-CoV-2/FLU/RSV testing.  Fact Sheet for Patients: BloggerCourse.com  Fact Sheet for Healthcare Providers: SeriousBroker.it  This test is not yet approved or cleared by the [place] FDA and has been authorized for detection and/or diagnosis of SARS-CoV-2 by FDA under an Emergency Use Authorization (EUA). This EUA will remain in effect (meaning this test can be used) for the duration of the COVID-19 declaration under Section 564(b)(1) of the Act, 21 U.S.C. section 360bbb-3(b)(1), unless the authorization is terminated or revoked.  Performed at [facility], [address] [place], [place] [address]          Radiology Studies: No results found.      Scheduled Meds:  ARIPiprazole  5 mg Oral Daily   [START ON 07/19/2020] ARIPiprazole  7.5 mg Oral Daily   brinzolamide  1 drop Left Eye BID   feeding supplement  237 mL Oral BID BM   latanoprost  1 drop Left Eye QHS   simvastatin  20 mg Oral Daily   traZODone  25 mg Oral QHS   [START ON 07/19/2020] venlafaxine XR  150 mg Oral Q breakfast   venlafaxine XR  75 mg Oral q morning   vitamin B-12  50 mcg Oral Daily   Continuous Infusions:  lactated ringers 75 mL/hr at 07/16/20 1152          Dajane Valli, MD [facility] 07/17/2020, 1:20 PM

## 2020-07-17 NOTE — NC FL2 (Signed)
MEDICAID FL2 LEVEL OF CARE SCREENING TOOL     IDENTIFICATION  Patient Name: Sheila Andrade Birthdate: January 17, 1935 Sex: female Admission Date (Current Location): 07/14/2020  [facility] and [place] Number:  Producer, television/film/video and Address:  [facility],  [address] [place], [place] [address]      Provider Number: [id number]  Attending Physician Name and Address:  Glade Lloyd, MD  Relative Name and Phone Number:  [name], [name] (Daughter)   [phone]    Current Level of Care: Hospital Recommended Level of Care: Skilled Nursing Facility Prior Approval Number:    Date Approved/Denied:   PASRR Number:    Discharge Plan: SNF    Current Diagnoses: Patient Active Problem List   Diagnosis Date Noted   Dehydration 07/16/2020   Hyponatremia 07/15/2020   Suicidal ideations 07/15/2020   MDD (major depressive disorder), recurrent episode, severe (HCC) 07/15/2020   UTI (urinary tract infection) 07/14/2020   Hot flashes 06/06/2020   Fever 05/27/2020   Weight loss 05/27/2020   Tinea corporis 05/27/2020   Anemia 05/27/2020    Orientation RESPIRATION BLADDER Height & Weight     Self, Situation, Place  Normal External catheter Weight: 51.3 kg Height:  5\' 7"  (170.2 cm)  BEHAVIORAL SYMPTOMS/MOOD NEUROLOGICAL BOWEL NUTRITION STATUS      Continent Diet (see d/c summary)  AMBULATORY STATUS COMMUNICATION OF NEEDS Skin   Extensive Assist Verbally Normal                       Personal Care Assistance Level of Assistance  Bathing, Feeding, Dressing Bathing Assistance: Maximum assistance Feeding assistance: Independent Dressing Assistance: Limited assistance     Functional Limitations Info  Sight, Hearing, Speech Sight Info: Adequate Hearing Info: Adequate Speech Info: Adequate    SPECIAL CARE FACTORS FREQUENCY  PT (By licensed PT), OT (By licensed OT)     PT Frequency: 5X/W OT Frequency: 5X/W            Contractures Contractures Info: Not  present    Additional Factors Info  Code Status, Allergies Code Status Info: full Allergies Info: NKA           Current Medications (07/17/2020):  This is the current hospital active medication list Current Facility-Administered Medications  Medication Dose Route Frequency Provider Last Rate Last Admin   acetaminophen (TYLENOL) tablet 650 mg  650 mg Oral Q6H PRN Doutova, Anastassia, MD       Or   acetaminophen (TYLENOL) suppository 650 mg  650 mg Rectal Q6H PRN Doutova, Anastassia, MD       ARIPiprazole (ABILIFY) tablet 5 mg  5 mg Oral Daily 07/19/2020, FNP   5 mg at 07/17/20 0854   [START ON 07/19/2020] ARIPiprazole (ABILIFY) tablet 7.5 mg  7.5 mg Oral Daily Starkes-Perry, Takia S, FNP       brinzolamide (AZOPT) 1 % ophthalmic suspension 1 drop  1 drop Left Eye BID 01-23-1996, MD   1 drop at 07/17/20 0848   feeding supplement (ENSURE ENLIVE / ENSURE PLUS) liquid 237 mL  237 mL Oral BID BM Doutova, Anastassia, MD   237 mL at 07/17/20 0847   HYDROcodone-acetaminophen (NORCO/VICODIN) 5-325 MG per tablet 1-2 tablet  1-2 tablet Oral Q4H PRN 07/19/20, MD       lactated ringers infusion   Intravenous Continuous Therisa Doyne, MD 75 mL/hr at 07/16/20 1152 New Bag at 07/16/20 1152   latanoprost (XALATAN) 0.005 % ophthalmic solution 1 drop  1  drop Left Eye QHS Therisa Doyne, MD   1 drop at 07/16/20 2113   polyethylene glycol (MIRALAX / GLYCOLAX) packet 17 g  17 g Oral Daily PRN Calvert Cantor, MD   17 g at 07/17/20 0853   simvastatin (ZOCOR) tablet 20 mg  20 mg Oral Daily Doutova, Anastassia, MD   20 mg at 07/17/20 0847   traZODone (DESYREL) tablet 25 mg  25 mg Oral QHS Doutova, Anastassia, MD   25 mg at 07/16/20 2111   [START ON 07/19/2020] venlafaxine XR (EFFEXOR-XR) 24 hr capsule 150 mg  150 mg Oral Q breakfast Maryagnes Amos, FNP       venlafaxine XR (EFFEXOR-XR) 24 hr capsule 75 mg  75 mg Oral q morning Maryagnes Amos, FNP   75 mg at 07/17/20  0847   vitamin B-12 (CYANOCOBALAMIN) tablet 50 mcg  50 mcg Oral Daily Paulette Blanch, PA-C   50 mcg at 07/17/20 7106     Discharge Medications: Please see discharge summary for a list of discharge medications.  Relevant Imaging Results:  Relevant Lab Results:   Additional Information [phone] [place], [place]

## 2020-07-17 NOTE — Progress Notes (Signed)
Initial Nutrition Assessment  DOCUMENTATION CODES:   Underweight  INTERVENTION:   -Ensure Enlive po BID, each supplement provides 350 kcal and 20 grams of protein  -Multivitamin with minerals daily  NUTRITION DIAGNOSIS:   Inadequate oral intake related to poor appetite as evidenced by per patient/family report.  GOAL:   Patient will meet greater than or equal to 90% of their needs  MONITOR:   PO intake, Supplement acceptance, Labs, Weight trends, I & O's  REASON FOR ASSESSMENT:   Consult Assessment of nutrition requirement/status  ASSESSMENT:   85 y.o. female with a history of depression who presented to the ED with siucidal ideation in the setting of continued functional decline, debility associated with tremors, bradykinesia. Urinalysis suggestive of UTI for which ceftriaxone was initially provided though with no reported symptoms, stopped awaiting urine culture. Due to [facility], psychiatry consulted initially recommending inpatient psychiatric stabilization, but the patient is no longer suicidal and committed to rehabilitation.  Patient in room with therapy at time of visit. Per chart review, pt has had poor appetite and increasing depression. Per family reports this may be related to the pandemic. Pt has been consuming 10-50% of meals since admission. Ensure supplements have been ordered.  Per weight records, pt has lost 21 lbs since 01/01/20 (15% wt loss x 6.5 months, significant for time frame). Suspect some degree of malnutrition based on weight loss and underweight status.  Medications: IV B-12, Lactated ringers, Miralax  Labs reviewed: Low Na   NUTRITION - FOCUSED PHYSICAL EXAM:  Unable to complete  Diet Order:   Diet Order             Diet regular Room service appropriate? Yes; Fluid consistency: Thin  Diet effective now                   EDUCATION NEEDS:   No education needs have been identified at this time  Skin:  Skin Assessment: Reviewed RN  Assessment  Last BM:  7/8  Height:   Ht Readings from Last 1 Encounters:  07/14/20 5\' 7"  (1.702 m)    Weight:   Wt Readings from Last 1 Encounters:  07/14/20 51.3 kg    BMI:  Body mass index is 17.7 kg/m.  Estimated Nutritional Needs:   Kcal:  1400-1600  Protein:  60-75g  Fluid:  1.6L/day  09/14/20, MS, RD, LDN Inpatient Clinical Dietitian Contact information available via Amion

## 2020-07-18 LAB — CBC WITH DIFFERENTIAL/PLATELET
Abs Immature Granulocytes: 0.03 10*3/uL (ref 0.00–0.07)
Basophils Absolute: 0.1 10*3/uL (ref 0.0–0.1)
Basophils Relative: 1 %
Eosinophils Absolute: 0.2 10*3/uL (ref 0.0–0.5)
Eosinophils Relative: 2 %
HCT: 39.6 % (ref 36.0–46.0)
Hemoglobin: 12.7 g/dL (ref 12.0–15.0)
Immature Granulocytes: 0 %
Lymphocytes Relative: 26 %
Lymphs Abs: 1.8 10*3/uL (ref 0.7–4.0)
MCH: 28.7 pg (ref 26.0–34.0)
MCHC: 32.1 g/dL (ref 30.0–36.0)
MCV: 89.6 fL (ref 80.0–100.0)
Monocytes Absolute: 0.8 10*3/uL (ref 0.1–1.0)
Monocytes Relative: 12 %
Neutro Abs: 4 10*3/uL (ref 1.7–7.7)
Neutrophils Relative %: 59 %
Platelets: 315 10*3/uL (ref 150–400)
RBC: 4.42 MIL/uL (ref 3.87–5.11)
RDW: 14.5 % (ref 11.5–15.5)
WBC: 6.9 10*3/uL (ref 4.0–10.5)
nRBC: 0 % (ref 0.0–0.2)

## 2020-07-18 LAB — BASIC METABOLIC PANEL
Anion gap: 7 (ref 5–15)
BUN: 14 mg/dL (ref 8–23)
CO2: 24 mmol/L (ref 22–32)
Calcium: 9.2 mg/dL (ref 8.9–10.3)
Chloride: 100 mmol/L (ref 98–111)
Creatinine, Ser: 0.61 mg/dL (ref 0.44–1.00)
GFR, Estimated: >60 mL/min (ref >60)
Glucose, Bld: 83 mg/dL (ref 70–99)
Potassium: 4.4 mmol/L (ref 3.5–5.1)
Sodium: 131 mmol/L — ABNORMAL LOW (ref 135–145)

## 2020-07-18 LAB — MAGNESIUM: Magnesium: 2.1 mg/dL (ref 1.7–2.4)

## 2020-07-18 MED ORDER — VITAMIN B-12 1000 MCG PO TABS
1000.0000 ug | ORAL_TABLET | Freq: Every day | ORAL | Status: DC
  Administered 2020-07-18 – 2020-07-20 (×3): 1000 ug via ORAL
  Filled 2020-07-18 (×3): qty 1

## 2020-07-18 MED ORDER — SODIUM CHLORIDE 0.9 % IV SOLN: INTRAVENOUS | Status: DC

## 2020-07-18 NOTE — TOC Progression Note (Addendum)
Transition of Care [facility]) - Progression Note    Patient Details  Name: Sheila Andrade MRN: [id number] Date of Birth: 1935-12-16  Transition of Care [facility]) CM/SW [place], [place] Phone Number: 07/18/2020, 1:37 PM  Clinical Narrative:   No bed offers.  [place] declined-no beds.  [place] is reviewing per phone request.  Expanded bed search to surrounding counties. Patient is 2 person assist. TOC will continue to follow during the course of hospitalization.  Addendum:  Met with patient, friend Tye [place], d in l [name] included in by phone.  Explained that we had no bed offers despite my best efforts.  Patient is wanting [place] to look in [place], [place], [place] counties.  Explained that since they are not in [place], I do not have time to cold call and hand fax, but if family gives me some leads of openings, I can fax information and follow up. We talked about plan B of patient returning home with a family member there, an aide as hired by family, and a [facility] services.  We will speak further in the AM.  Expected Discharge Plan: [place] Barriers to Discharge: SNF Pending bed offer  Expected Discharge Plan and Services Expected Discharge Plan: [place]   Discharge Planning Services: CM Consult Post Acute Care Choice: [place] Living arrangements for the past 2 months: Single Family Home                                       Social Determinants of Health (SDOH) Interventions    Readmission Risk Interventions No flowsheet data found.

## 2020-07-18 NOTE — Care Management Important Message (Signed)
Important Message  Patient Details IM Letter placed in Patient's room Name: Sheila Andrade MRN: [id number] Date of Birth: 09/06/1935   Medicare Important Message Given:  Yes     Caren Macadam 07/18/2020, 2:11 PM

## 2020-07-18 NOTE — Plan of Care (Signed)
  Problem: Clinical Measurements: Goal: Ability to maintain clinical measurements within normal limits will improve Outcome: Progressing   

## 2020-07-19 DIAGNOSIS — E871 Hypo-osmolality and hyponatremia: Secondary | ICD-10-CM | POA: Diagnosis not present

## 2020-07-19 DIAGNOSIS — F332 Major depressive disorder, recurrent severe without psychotic features: Secondary | ICD-10-CM | POA: Diagnosis not present

## 2020-07-19 DIAGNOSIS — R634 Abnormal weight loss: Secondary | ICD-10-CM | POA: Diagnosis not present

## 2020-07-19 LAB — BASIC METABOLIC PANEL WITH GFR
Anion gap: 7 (ref 5–15)
BUN: 13 mg/dL (ref 8–23)
CO2: 23 mmol/L (ref 22–32)
Calcium: 8.9 mg/dL (ref 8.9–10.3)
Chloride: 102 mmol/L (ref 98–111)
Creatinine, Ser: 0.53 mg/dL (ref 0.44–1.00)
GFR, Estimated: >60 mL/min
Glucose, Bld: 94 mg/dL (ref 70–99)
Potassium: 3.8 mmol/L (ref 3.5–5.1)
Sodium: 132 mmol/L — ABNORMAL LOW (ref 135–145)

## 2020-07-19 LAB — MAGNESIUM: Magnesium: 2 mg/dL (ref 1.7–2.4)

## 2020-07-19 NOTE — Consult Note (Addendum)
15:30-Multiple attempts to follow-up with patient's daughter Corrie Dandy regarding update.  HIPAA safe message left.   15:43- NP called [facility] and spoke to daughter [name] Epley - answered and addressed concerns with regards to patient being cleared by psychiatry. Patient to continue medications as directed.  CSW to provided additional outpatient resources.

## 2020-07-19 NOTE — Progress Notes (Signed)
Occupational Therapy Treatment Patient Details Name: Sheila Andrade MRN: [id number] DOB: 04/27/1935 Today's Date: 07/19/2020    History of present illness 85 y.o. female adm with recurrent UTI PMH:  recent COVID, glaucoma osteoporosis, HLD, depression . Pysch consulted   OT comments  Upon arrival patient seated in recliner. Patient needing max A x2 to power up to standing, max redirection due to fear of falling. Patient letting go of walker trying to grab onto CNA in standing needing cues for safety. Max x2 to pivot with walker to bedside commode. Patient max x2 to stand from commode, used stedy to get back to bed. Patient total A for perianal care from CNA post bowel movement and mod to max A from OT for balance due to significant posterior lean. This is decline from OT evaluation on 7/12 when she was mod x1 to pivot to recliner. However this decline does seem acute as family reports patient was ambulatory and more independent with ADL tasks week prior to admission. Update D/C recommendations as patient has good family support and would benefit from intensive rehab.    Follow Up Recommendations  CIR    Equipment Recommendations  Other (comment) (defer to next venue)    Recommendations for Other Services Rehab consult    Precautions / Restrictions Precautions Precautions: Fall       Mobility Bed Mobility Overal bed mobility: Needs Assistance         Sit to supine: Mod assist;+2 for physical assistance   General bed mobility comments: needs max cues to lean toward pillow vs leaning straight back onto bed and assist to lift legs onto bed    Transfers Overall transfer level: Needs assistance Equipment used: Rolling walker (2 wheeled) Transfers: Sit to/from [employer] Sit to Stand: Max assist;+2 physical assistance;+2 safety/equipment Stand pivot transfers: Max assist;Total assist;+2 physical assistance;+2 safety/equipment       General transfer comment:  patient with significant posterior lean also fearful of falling needing max cues to redirect for hand placement. Limited trunk mobility and max x2 to power up to standing from recliner chair. Max x2 to take small shuffled steps to bedside commode with walker, patient letting go needing cues to redirect. Used stedy to transfer patient from commode to bed for safety due to fear of falling and significant posterior lean    Balance Overall balance assessment: Needs assistance Sitting-balance support: Feet supported Sitting balance-Leahy Scale: Poor   Postural control: Posterior lean Standing balance support: Bilateral upper extremity supported Standing balance-Leahy Scale: Zero Standing balance comment: reliant on UEs and max A x2                           ADL either performed or assessed with clinical judgement   ADL Overall ADL's : Needs assistance/impaired                         Toilet Transfer: Maximal assistance;+2 for physical assistance;+2 for safety/equipment;Cueing for safety;Cueing for sequencing;Stand-pivot;BSC;RW Toilet Transfer Details (indicate cue type and reason): strong posterior lean, rigidity in trunk limited ability to rock foward for momentum to power up to standing. max x2 with walker to pivot to recliner chair. Used stedy to get off of toilet and back to bed. Fearful of falling, letting go of walker trying to hold onto CNA Toileting- Clothing Manipulation and Hygiene: Total assistance;Sit to/from stand Toileting - Clothing Manipulation Details (indicate cue type and reason): total A  from CNA for peri care while OT provide mod to max A in standing     Functional mobility during ADLs: Maximal assistance;+2 for physical assistance;+2 for safety/equipment        Cognition Arousal/Alertness: Awake/alert Behavior During Therapy: Impulsive Overall Cognitive Status: Impaired/Different from baseline Area of Impairment: Following  commands;Safety/judgement;Problem solving                       Following Commands: Follows one step commands with increased time;Follows one step commands inconsistently Safety/Judgement: Decreased awareness of safety   Problem Solving: Requires verbal cues;Requires tactile cues General Comments: fearful of falling needing max cues to redirect + for hand placement during transfers                   Pertinent Vitals/ Pain       Pain Assessment: Faces Faces Pain Scale: No hurt         Frequency  Min 2X/week        Progress Toward Goals  OT Goals(current goals can now be found in the care plan section)  Progress towards OT goals: Progressing toward goals  Acute Rehab OT Goals Patient Stated Goal: "figure out what is going on" OT Goal Formulation: With family Time For Goal Achievement: 07/30/20 Potential to Achieve Goals: Good ADL Goals Pt Will Transfer to Toilet: with min guard assist;ambulating;bedside commode (walker) Pt Will Perform Toileting - Clothing Manipulation and hygiene: with min assist;sit to/from stand;sitting/lateral leans Additional ADL Goal #1: Patient will tolerate 5 minutes standing activity at min G level in order to participate in self care tasks. Additional ADL Goal #2: patient will follow 1 step directions with less than 25% multimodal cues in order to participate in self care tasks.  Plan Discharge plan needs to be updated       AM-PAC OT "6 Clicks" Daily Activity     Outcome Measure   Help from another person eating meals?: A Little Help from another person taking care of personal grooming?: A Little Help from another person toileting, which includes using toliet, bedpan, or urinal?: Total Help from another person bathing (including washing, rinsing, drying)?: A Lot Help from another person to put on and taking off regular upper body clothing?: A Little Help from another person to put on and taking off regular lower body clothing?:  Total 6 Click Score: 13    End of Session Equipment Utilized During Treatment: Gait belt;Rolling walker  OT Visit Diagnosis: Other abnormalities of gait and mobility (R26.89);Muscle weakness (generalized) (M62.81);Other symptoms and signs involving cognitive function;Unsteadiness on feet (R26.81)   Activity Tolerance Patient tolerated treatment well   Patient Left in bed;with call bell/phone within reach;with nursing/sitter in room;with family/visitor present   Nurse Communication Mobility status;Need for lift equipment        Time: 3474-2595 OT Time Calculation (min): 28 min  Charges: OT General Charges $OT Visit: 1 Visit OT Treatments $Self Care/Home Management : 23-37 mins  Marlyce Huge OT OT pager: [phone]   Carmelia Roller 07/19/2020, 2:19 PM

## 2020-07-19 NOTE — Progress Notes (Signed)
Physical Therapy Treatment Patient Details Name: Sheila Andrade MRN: [id number] DOB: 07/28/1935 Today's Date: 07/19/2020    History of Present Illness 85 y.o. female adm with recurrent UTI PMH:  recent COVID, glaucoma osteoporosis, HLD, depression . Pysch consulted    PT Comments    Patient remains greatly limited with mobility and family present this session providing information on PLOF. Patient required Mod-Max +2 assist for repeated sit to stands and posterior lean increased this session. Seated exercises completed to facilitate anterior weight shift and pt demonstrated improved upright stance with sit to stands following exercise. Mod-Max +2 assist required to manage RW and sequence steps towards recliner. Pt then ambulated ~6' with close chair follow and mod +2 assist with manual cues to facilitate head up and hips forward. Of significance for changes pt was given meds by RN and was unable to swallow larger pills. She required cues/assist for coughing and clearing throat and eventually required pills to be in applesauce as she could not even swallow halved pills. 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 MD notified of change in swallowing. After discussion of impairments and PLOF of independent with RW to ambulate PTA with pt/family and family reporting they are able to provide 24/7 assist with family and aids recommendations are being updated. Patient would greatly benefit from intense rehab at [facility] to address impairments and improved independence with mobility prior to return home. Acute PT will continue to progress pt as able.     Follow Up Recommendations  CIR     Equipment Recommendations  None recommended by PT    Recommendations for Other Services Rehab consult     Precautions / Restrictions Precautions Precautions: Fall Restrictions Weight Bearing  Restrictions: No    Mobility  Bed Mobility Overal bed mobility: Needs Assistance Bed Mobility: Supine to Sit     Supine to sit: Mod assist;HOB elevated     General bed mobility comments: Mod assist for sequencing and to initiate reaching for bed rail. pt limited by Lt UE pain due to [facility] injury. Pt required Mod assist to bring Bil LE's off EOB and raise trunk from elevated HOB.    Transfers Overall transfer level: Needs assistance Equipment used: Rolling walker (2 wheeled) Transfers: Sit to/from [employer] Sit to Stand: +2 physical assistance;+2 safety/equipment;Mod assist;Max assist;From elevated surface Stand pivot transfers: +2 physical assistance;+2 safety/equipment;Mod assist;Max assist       General transfer comment: Mod-Max +2 assist to initiate rise from EOB. Pt with significant posterior lean and cues needed to walk feet backwards for more upright posture. pt performed x2 from EOB. Pt then completed exercises to faciliate anterior weigth shift and then stood with slight improvement in posture. Pt stepped to recliner with shuffling side-steps and max assist to manage RW and prevent posterior lean.  Ambulation/Gait Ambulation/Gait assistance: Mod assist;Max assist;+2 physical assistance;+2 safety/equipment Gait Distance (Feet): 6 Feet Assistive device: Rolling walker (2 wheeled) Gait Pattern/deviations: Step-through pattern;Decreased stride length;Shuffle;Narrow base of support;Festinating Gait velocity: decr       Stairs             Wheelchair Mobility    Modified Rankin (Stroke Patients Only)       Balance Overall balance assessment: Needs assistance Sitting-balance support: Feet supported Sitting balance-Leahy Scale: Poor   Postural control: Posterior lean Standing balance support: Bilateral upper extremity supported Standing balance-Leahy Scale: Zero Standing balance comment: reliant on UEs and max A  x2                             Cognition Arousal/Alertness: Awake/alert Behavior During Therapy: Impulsive Overall Cognitive Status: Impaired/Different from baseline Area of Impairment: Following commands;Safety/judgement;Problem solving                       Following Commands: Follows one step commands with increased time;Follows one step commands inconsistently Safety/Judgement: Decreased awareness of safety   Problem Solving: Requires verbal cues;Requires tactile cues General Comments: fearful of falling; needing max cues to redirect + for hand placement during transfers      Exercises Other Exercises Other Exercises: 2x5 reps anterior trunk lean in sitting: pt reaching bil UE's forward on recliner arm rest Other Exercises: 3x sit<>stands with reaching bil UE's anteriorly on chair prior to rise to facilitate anterior weight shift    General Comments        Pertinent Vitals/Pain Pain Assessment: Faces Faces Pain Scale: No hurt    Home Living                      Prior Function            PT Goals (current goals can now be found in the care plan section) Acute Rehab PT Goals Patient Stated Goal: "figure out what is going on" PT Goal Formulation: With patient Time For Goal Achievement: 07/29/20 Potential to Achieve Goals: Fair Progress towards PT goals: Progressing toward goals    Frequency    Min 3X/week      PT Plan Current plan remains appropriate    Co-evaluation              AM-PAC PT "6 Clicks" Mobility   Outcome Measure  Help needed turning from your back to your side while in a flat bed without using bedrails?: A Little Help needed moving from lying on your back to sitting on the side of a flat bed without using bedrails?: A Little Help needed moving to and from a bed to a chair (including a wheelchair)?: A Lot Help needed standing up from a chair using your arms (e.g., wheelchair or bedside chair)?: A Lot Help needed to walk in hospital  room?: A Lot Help needed climbing 3-5 steps with a railing? : Total 6 Click Score: 13    End of Session Equipment Utilized During Treatment: Gait belt Activity Tolerance: Patient tolerated treatment well Patient left: with call bell/phone within reach;in chair;with chair alarm set;with family/visitor present Nurse Communication: Mobility status PT Visit Diagnosis: Other abnormalities of gait and mobility (R26.89);Difficulty in walking, not elsewhere classified (R26.2)     Time: 3009-2330 PT Time Calculation (min) (ACUTE ONLY): 38 min  Charges:  $Gait Training: 8-22 mins $Neuromuscular Re-education: 23-37 mins                     Wynn Maudlin, DPT Acute Rehabilitation Services Office [phone] Pager [phone]    Anitra Lauth 07/19/2020, 6:07 PM

## 2020-07-19 NOTE — TOC Progression Note (Signed)
Transition of Care [facility]) - Progression Note    Patient Details  Name: Sheila Andrade MRN: [id number] Date of Birth: [birth year]  Transition of Care [facility]) CM/SW Contact  Ida Rogue, [place] Phone Number: 07/19/2020, 1:55 PM  Clinical Narrative:   Selinda Michaels, spoke with Lupita Leash per [facility] request.  She reviewed chart, needs to speak with leadership further.  Will not make a decision until Monday at the earliest. [facility] will continue to follow during the course of hospitalization.     Expected Discharge Plan: Skilled Nursing Facility Barriers to Discharge: SNF Pending bed offer  Expected Discharge Plan and Services Expected Discharge Plan: Skilled Nursing Facility   Discharge Planning Services: CM Consult Post Acute Care Choice: Skilled Nursing Facility Living arrangements for the past 2 months: Single Family Home                                       Social Determinants of Health (SDOH) Interventions    Readmission Risk Interventions No flowsheet data found.

## 2020-07-19 NOTE — Consult Note (Signed)
[facility] Face-to-Face Psychiatry Consult   Reason for Consult: Depression with suicidal ideations  Referring Physician:   Patient Identification: Sheila Andrade MRN:  [id number] Principal Diagnosis: MDD (major depressive disorder), recurrent episode, severe (HCC) Diagnosis:  Principal Problem:   MDD (major depressive disorder), recurrent episode, severe (HCC) Active Problems:   Weight loss   Anemia   UTI (urinary tract infection)   Hyponatremia   Suicidal ideations   Dehydration   Total Time spent with patient: 15 minutes  Subjective:   Sheila Andrade is a 85 y.o. female was seen and evaluated face-to-face.  Patient's daughter-in-law Sheila Andrade at bedside.  Patient provided verbal authorization for daughter to stay throughout this assessment.  She adamantly denies suicidal or homicidal ideations denies auditory or visual hallucinations.  Does report a history of depression and depressive symptoms. Sheila Andrade does report worsening depression after her standing height fall. She stated she has a broken hip and multiple leg fractures and unable to ambulate without assistance. Hagan stated"  I just want to get back to my normal life, I don't like putting my family through this." She denied previous inpatient admissions. Denies previous suicide attempts plan or intent. Reported she is taken and tolerating current medication well.   Sheila Andrade stated that she was followed by therapy in the past (Sheila Andrade) , Reports only attended 5 sessions as she states "it was not a good fit".  Daughter-in-law denied any safety concerns in regards  to mental health. She reported patient has psychiatry appointment in September in [place]. She stated that CSW is seeking possible assisted living facility placement.   Chart reviewed: Patient to continue Effexor 150 mg and trazodone 25 mg daily and Abilify was discontinued 07/16/2020. Patient to follow-up with outpatient providers.   NP attempted to follow-up with patient's daughter  Sheila Andrade no answer.    HPI:    Past Psychiatric History:   Risk to Self:   Risk to Others:   Prior Inpatient Therapy:   Prior Outpatient Therapy:    Past Medical History:  Past Medical History:  Diagnosis Date   Hot flashes 06/06/2020   Hypertension    History reviewed. No pertinent surgical history. Family History:  Family History  Problem Relation Age of Onset   Hypertension Other    Family Psychiatric  History:  Social History:  Social History   Substance and Sexual Activity  Alcohol Use None     Social History   Substance and Sexual Activity  Drug Use Never    Social History   Socioeconomic History   Marital status: Widowed    Spouse name: Not on file   Number of children: Not on file   Years of education: Not on file   Highest education level: Not on file  Occupational History   Not on file  Tobacco Use   Smoking status: Never   Smokeless tobacco: Never  Substance and Sexual Activity   Alcohol use: Not on file   Drug use: Never   Sexual activity: Not on file  Other Topics Concern   Not on file  Social History Narrative   Not on file   Social Determinants of Health   Financial Resource Strain: Not on file  Food Insecurity: Not on file  Transportation Needs: Not on file  Physical Activity: Not on file  Stress: Not on file  Social Connections: Not on file   Additional Social History:    Allergies:  No Known Allergies  Labs:  Results for orders placed or performed  during the hospital encounter of 07/14/20 (from the past 48 hour(s))  CBC with Differential/Platelet     Status: None   Collection Time: 07/18/20  4:57 AM  Result Value Ref Range   WBC 6.9 4.0 - 10.5 K/uL   RBC 4.42 3.87 - 5.11 MIL/uL   Hemoglobin 12.7 12.0 - 15.0 g/dL   HCT 16.139.6 09.636.0 - 04.546.0 %   MCV 89.6 80.0 - 100.0 fL   MCH 28.7 26.0 - 34.0 pg   MCHC 32.1 30.0 - 36.0 g/dL   RDW 40.914.5 81.111.5 - 91.415.5 %   Platelets 315 150 - 400 K/uL   nRBC 0.0 0.0 - 0.2 %    Neutrophils Relative % 59 %   Neutro Abs 4.0 1.7 - 7.7 K/uL   Lymphocytes Relative 26 %   Lymphs Abs 1.8 0.7 - 4.0 K/uL   Monocytes Relative 12 %   Monocytes Absolute 0.8 0.1 - 1.0 K/uL   Eosinophils Relative 2 %   Eosinophils Absolute 0.2 0.0 - 0.5 K/uL   Basophils Relative 1 %   Basophils Absolute 0.1 0.0 - 0.1 K/uL   Immature Granulocytes 0 %   Abs Immature Granulocytes 0.03 0.00 - 0.07 K/uL    Comment: Performed at [facility], [address] [place], [place] [address]  Basic metabolic panel     Status: Abnormal   Collection Time: 07/18/20  4:57 AM  Result Value Ref Range   Sodium 131 (L) 135 - 145 mmol/L   Potassium 4.4 3.5 - 5.1 mmol/L   Chloride 100 98 - 111 mmol/L   CO2 24 22 - 32 mmol/L   Glucose, Bld 83 70 - 99 mg/dL    Comment: Glucose reference range applies only to samples taken after fasting for at least 8 hours.   BUN 14 8 - 23 mg/dL   Creatinine, Ser 6.210.61 0.44 - 1.00 mg/dL   Calcium 9.2 8.9 - 30.810.3 mg/dL   GFR, Estimated >65>60 >78>60 mL/min    Comment: (NOTE) Calculated using the CKD-EPI Creatinine Equation (2021)    Anion gap 7 5 - 15    Comment: Performed at [facility], [address] [place], [place] [address]  Magnesium     Status: None   Collection Time: 07/18/20  4:57 AM  Result Value Ref Range   Magnesium 2.1 1.7 - 2.4 mg/dL    Comment: Performed at [facility], [address] [place], [place] [address]  Basic metabolic panel     Status: Abnormal   Collection Time: 07/19/20  4:48 AM  Result Value Ref Range   Sodium 132 (L) 135 - 145 mmol/L   Potassium 3.8 3.5 - 5.1 mmol/L   Chloride 102 98 - 111 mmol/L   CO2 23 22 - 32 mmol/L   Glucose, Bld 94 70 - 99 mg/dL    Comment: Glucose reference range applies only to samples taken after fasting for at least 8 hours.   BUN 13 8 - 23 mg/dL   Creatinine, Ser 1.320.53 0.44 - 1.00 mg/dL   Calcium 8.9 8.9 - 44.010.3 mg/dL   GFR, Estimated >10>60 >27>60 mL/min     Comment: (NOTE) Calculated using the CKD-EPI Creatinine Equation (2021)    Anion gap 7 5 - 15    Comment: Performed at [facility], [address] [place], [place] [address]  Magnesium     Status: None   Collection Time: 07/19/20  4:48 AM  Result Value Ref Range  Magnesium 2.0 1.7 - 2.4 mg/dL    Comment: Performed at [facility], [address] [place], [place] [address]    Current Facility-Administered Medications  Medication Dose Route Frequency Provider Last Rate Last Admin   acetaminophen (TYLENOL) tablet 650 mg  650 mg Oral Q6H PRN Therisa Doyne, MD   650 mg at 07/18/20 1112   Or   acetaminophen (TYLENOL) suppository 650 mg  650 mg Rectal Q6H PRN Doutova, Anastassia, MD       brinzolamide (AZOPT) 1 % ophthalmic suspension 1 drop  1 drop Left Eye BID Therisa Doyne, MD   1 drop at 07/18/20 2112   feeding supplement (ENSURE ENLIVE / ENSURE PLUS) liquid 237 mL  237 mL Oral BID BM Doutova, Anastassia, MD   237 mL at 07/18/20 1404   HYDROcodone-acetaminophen (NORCO/VICODIN) 5-325 MG per tablet 1-2 tablet  1-2 tablet Oral Q4H PRN Doutova, Anastassia, MD       latanoprost (XALATAN) 0.005 % ophthalmic solution 1 drop  1 drop Left Eye QHS Therisa Doyne, MD   1 drop at 07/18/20 2113   multivitamin with minerals tablet 1 tablet  1 tablet Oral Daily Glade Lloyd, MD   1 tablet at 07/18/20 5400   polyethylene glycol (MIRALAX / GLYCOLAX) packet 17 g  17 g Oral Daily PRN Calvert Cantor, MD   17 g at 07/18/20 0808   simvastatin (ZOCOR) tablet 20 mg  20 mg Oral Daily Doutova, Anastassia, MD   20 mg at 07/18/20 8676   traZODone (DESYREL) tablet 25 mg  25 mg Oral QHS Doutova, Anastassia, MD   25 mg at 07/18/20 2112   venlafaxine XR (EFFEXOR-XR) 24 hr capsule 150 mg  150 mg Oral Q breakfast Starkes-Perry, Juel Burrow, FNP       vitamin B-12 (CYANOCOBALAMIN) tablet 1,000 mcg  1,000 mcg Oral Daily Glade Lloyd, MD   1,000 mcg at 07/18/20 1319     Musculoskeletal: Strength & Muscle Tone: within normal limits Gait & Station: normal Patient leans: N/A    Psychiatric Specialty Exam:  Presentation  General Appearance: Appropriate for Environment; Casual  Eye Contact:Minimal  Speech:Slow; Clear and Coherent  Speech Volume:Decreased  Handedness:Right   Mood and Affect  Mood:Depressed; Dysphoric  Affect:Depressed; Flat   Thought Process  Thought Processes:Coherent; Linear; Goal Directed  Descriptions of Associations:Intact  Orientation:Full (Time, Place and Person)  Thought Content:Logical  History of Schizophrenia/Schizoaffective disorder:No data recorded Duration of Psychotic Symptoms:No data recorded Hallucinations:No data recorded Ideas of Reference:None  Suicidal Thoughts:No data recorded Homicidal Thoughts:No data recorded  Sensorium  Memory:Recent Good; Immediate Good; Remote Good  Judgment:Fair  Insight:Fair   Executive Functions  Concentration:Fair  Attention Span:Good  Recall:Good  Fund of Knowledge:Good  Language:Good   Psychomotor Activity  Psychomotor Activity: No data recorded  Assets  Assets:Desire for Improvement; Manufacturing systems engineer; Housing; Social Support; Resilience; Physical Health; Leisure Time; Vocational/Educational; Talents/Skills; Transportation; Financial Resources/Insurance   Sleep  Sleep: No data recorded  Physical Exam: Physical Exam Vitals reviewed.  Neurological:     Mental Status: She is alert.  Psychiatric:        Attention and Perception: Attention normal.        Mood and Affect: Mood normal.        Speech: Speech normal.        Behavior: Behavior normal.        Thought Content: Thought content normal.        Cognition and Memory: Cognition normal.   ROS Blood pressure Marland Kitchen)  151/82, pulse 82, temperature 98.4 F (36.9 C), temperature source Oral, resp. rate 18, height 5\' 7"  (1.702 m), weight 51.3 kg, SpO2 97 %. Body mass index is 17.7  kg/m.  Treatment Plan Summary: Daily contact with patient to assess and evaluate symptoms and progress in treatment and Medication management  Chart reviewed: Patient to continue Effexor 150 mg and trazodone 25 mg daily and Abilify was discontinued 07/16/2020. Patient to follow-up with outpatient providers.   - Per R. 09/16/2020 CSW will continue seeking SNF placement  Disposition: No evidence of imminent risk to self or others at present.   Patient does not meet criteria for psychiatric inpatient admission. Supportive therapy provided about ongoing stressors. Refer to IOP. Discussed crisis plan, support from social network, calling 911, coming to the Emergency Department, and calling Suicide Hotline.  [place], NP 07/19/2020 10:30 AM

## 2020-07-19 NOTE — Progress Notes (Signed)
Patient ID: Sheila Andrade, female   DOB: 11/20/1935, 85 y.o.   MRN: [id number]  PROGRESS NOTE    Sheila Andrade  VHQ:[id number] DOB: 11/20/1935 DOA: 07/14/2020 PCP: Dalphine HandingShah, Nieve Rose, MD   Brief Narrative:  85 y.o. female with a history of depression who presented to the ED with siucidal ideation in the setting of continued functional decline, debility associated with tremors, bradykinesia.  She was initially started on Rocephin for possible UTI but subsequently stopped.  Psychiatry initially recommended inpatient psychiatric hospitalization but subsequently patient was no longer suicidal and hence psychiatry recommended outpatient follow-up.  Neurology was also consulted for possible movement disorder.  Assessment & Plan:   Severe depression Suicidal ideation: Resolved - Psychiatry initially recommended inpatient psychiatric hospitalization but subsequently patient was no longer suicidal and hence psychiatry recommended outpatient follow-up. -Suicide precautions/sitter have been discontinued -Currently on Effexor and trazodone.  Abilify has been stopped by psychiatry.  Family has questions regarding the alternatives to Abilify and psychiatric follow-up as an outpatient; they want to follow-up with psychiatry locally.  I have relayed this information to Dr. Glory RosebushKumar/psychiatry via secure chat.  Weight loss, failure to thrive, dehydration -Functionally worsening since fall/pelvic fracture in January 2022 which required rehab following which she has been living with son and daughter-in-law in [place] -Oral intake still poor.  Follow nutrition recommendations -Encourage oral intake.  Oral intake is improving.  DC IV fluids today.  Mild hyponatremia -IV fluids plan as well.  Monitor  ?  Movement disorder - Several overlapping clinical features between Parkinsonian disorder and severe depression/pseudodementia. -Neurology evaluation appreciated: Neurology recommends to maintain vitamin B12 above  400.  Vitamin B12 was 254 on presentation.  Will continue supplementation.  Outpatient follow-up with neurology; might need to start Sinemet as an outpatient -MRI brain showed moderate microvascular ischemic disease, mild to moderate atrophy, no acute abnormalities  Abnormal urinalysis -No pyuria; Rocephin has been stopped  DVT prophylaxis: SCDs Code Status: Full Family Communication: Daughter-in-law at bedside Disposition Plan: Status is: Inpatient  Remains inpatient appropriate because:Inpatient level of care appropriate due to severity of illness  Dispo: The patient is from: Home              Anticipated d/c is to: SNF              Patient currently is medically stable to d/c.   Difficult to place patient Yes   Consultants: Neurology/psychiatry  Procedures: None  Antimicrobials:  Anti-infectives (From admission, onward)    Start     Dose/Rate Route Frequency Ordered Stop   07/15/20 2000  cefTRIAXone (ROCEPHIN) 1 g in sodium chloride 0.9 % 100 mL IVPB  Status:  Discontinued        1 g 200 mL/hr over 30 Minutes Intravenous Every 24 hours 07/15/20 0000 07/15/20 1635   07/14/20 2315  cefTRIAXone (ROCEPHIN) 1 g in sodium chloride 0.9 % 100 mL IVPB        1 g 200 mL/hr over 30 Minutes Intravenous  Once 07/14/20 2307 07/15/20 0040        Subjective: Patient seen and examined at bedside.  Daughter-in-law present at bedside.  Oral intake is improving.  No worsening shortness breath, fever or vomiting reported. Objective: Vitals:   07/18/20 0421 07/18/20 1318 07/18/20 1946 07/19/20 0453  BP: (!) 153/76 129/73 (!) 148/73 (!) 151/82  Pulse: 72 91 86 82  Resp:  16 18 18   Temp:  98.2 F (36.8 C) 98.6 F (37 C) 98.4 F (36.9 C)  TempSrc:  Oral Oral Oral  SpO2:  97% 99% 97%  Weight:      Height:        Intake/Output Summary (Last 24 hours) at 07/19/2020 0953 Last data filed at 07/19/2020 0600 Gross per 24 hour  Intake 1425.96 ml  Output 700 ml  Net 725.96 ml    Filed  Weights   07/14/20 1755  Weight: 51.3 kg    Examination:  General exam: Still on room air.  No distress.  Looks chronically ill and deconditioned.  Flat affect Respiratory system: Bilateral decreased breath sounds at bases with scattered crackles  cardiovascular system: S1-S2 heard, rate controlled  gastrointestinal system: Abdomen is distended slightly, soft and nontender.  Normal bowel sounds heard Extremities: No edema or clubbing   Data Reviewed: I have personally reviewed following labs and imaging studies  CBC: Recent Labs  Lab 07/14/20 2025 07/15/20 0331 07/18/20 0457  WBC 7.5 7.6 6.9  NEUTROABS 4.8 5.3 4.0  HGB 12.4 12.6 12.7  HCT 39.1 39.3 39.6  MCV 89.5 88.3 89.6  PLT 349 319 315    Basic Metabolic Panel: Recent Labs  Lab 07/15/20 0000 07/15/20 0331 07/16/20 0443 07/17/20 0451 07/18/20 0457 07/19/20 0448  NA  --  135 133* 131* 131* 132*  K  --  3.9 4.2 4.5 4.4 3.8  CL  --  102 102 100 100 102  CO2  --  26 24 24 24 23   GLUCOSE  --  93 100* 85 83 94  BUN  --  12 21 16 14 13   CREATININE  --  0.56 0.69 0.53 0.61 0.53  CALCIUM  --  9.3 9.2 9.2 9.2 8.9  MG 2.1 2.1  --   --  2.1 2.0  PHOS 4.0 3.5  --   --   --   --     GFR: Estimated Creatinine Clearance: 43.9 mL/min (by C-G formula based on SCr of 0.53 mg/dL). Liver Function Tests: Recent Labs  Lab 07/14/20 2025 07/15/20 0331  AST 20 17  ALT 15 16  ALKPHOS 59 58  BILITOT 0.6 0.4  PROT 7.2 6.9  ALBUMIN 4.0 3.9    No results for input(s): LIPASE, AMYLASE in the last 168 hours. Recent Labs  Lab 07/14/20 2025  AMMONIA 28    Coagulation Profile: No results for input(s): INR, PROTIME in the last 168 hours. Cardiac Enzymes: Recent Labs  Lab 07/15/20 0030  CKTOTAL 59    BNP (last 3 results) No results for input(s): PROBNP in the last 8760 hours. HbA1C: No results for input(s): HGBA1C in the last 72 hours. CBG: No results for input(s): GLUCAP in the last 168 hours. Lipid Profile: No  results for input(s): CHOL, HDL, LDLCALC, TRIG, CHOLHDL, LDLDIRECT in the last 72 hours. Thyroid Function Tests: No results for input(s): TSH, T4TOTAL, FREET4, T3FREE, THYROIDAB in the last 72 hours.  Anemia Panel: No results for input(s): VITAMINB12, FOLATE, FERRITIN, TIBC, IRON, RETICCTPCT in the last 72 hours.  Sepsis Labs: Recent Labs  Lab 07/14/20 2025 07/15/20 0105  LATICACIDVEN 1.1 0.6     Recent Results (from the past 240 hour(s))  Urine culture     Status: Abnormal   Collection Time: 07/14/20  6:49 PM   Specimen: Urine, Random  Result Value Ref Range Status   Specimen Description   Final    URINE, RANDOM Performed at [facility], [address], [place], [place]    Special Requests   Final  NONE Performed at [facility], [address], [place], [place] [address]    Culture (A)  Final    >=100,000 COLONIES/mL KLEBSIELLA ORNITHINOLYTICA >=100,000 COLONIES/mL KLEBSIELLA OXYTOCA    Report Status 07/17/2020 FINAL  Final   Organism ID, Bacteria KLEBSIELLA ORNITHINOLYTICA (A)  Final   Organism ID, Bacteria KLEBSIELLA OXYTOCA (A)  Final      Susceptibility   Klebsiella ornithinolytica - MIC*    AMPICILLIN >=32 RESISTANT Resistant     CEFAZOLIN 16 SENSITIVE Sensitive     CEFEPIME <=0.12 SENSITIVE Sensitive     CEFTRIAXONE <=0.25 SENSITIVE Sensitive     CIPROFLOXACIN <=0.25 SENSITIVE Sensitive     GENTAMICIN <=1 SENSITIVE Sensitive     IMIPENEM 0.5 SENSITIVE Sensitive     NITROFURANTOIN 32 SENSITIVE Sensitive     TRIMETH/SULFA <=20 SENSITIVE Sensitive     AMPICILLIN/SULBACTAM 16 INTERMEDIATE Intermediate     PIP/TAZO <=4 SENSITIVE Sensitive     * >=100,000 COLONIES/mL KLEBSIELLA ORNITHINOLYTICA   Klebsiella oxytoca - MIC*    AMPICILLIN RESISTANT Resistant     CEFAZOLIN <=4 SENSITIVE Sensitive     CEFEPIME <=0.12 SENSITIVE Sensitive     CEFTRIAXONE <=0.25 SENSITIVE Sensitive     CIPROFLOXACIN <=0.25 SENSITIVE  Sensitive     GENTAMICIN <=1 SENSITIVE Sensitive     IMIPENEM <=0.25 SENSITIVE Sensitive     NITROFURANTOIN <=16 SENSITIVE Sensitive     TRIMETH/SULFA <=20 SENSITIVE Sensitive     AMPICILLIN/SULBACTAM <=2 SENSITIVE Sensitive     PIP/TAZO <=4 SENSITIVE Sensitive     * >=100,000 COLONIES/mL KLEBSIELLA OXYTOCA  Resp Panel by RT-PCR (Flu A&B, Covid) Nasopharyngeal Swab     Status: None   Collection Time: 07/14/20  8:25 PM   Specimen: Nasopharyngeal Swab; Nasopharyngeal(NP) swabs in vial transport medium  Result Value Ref Range Status   SARS Coronavirus 2 by RT PCR NEGATIVE NEGATIVE Final    Comment: (NOTE) SARS-CoV-2 target nucleic acids are NOT DETECTED.  The SARS-CoV-2 RNA is generally detectable in upper respiratory specimens during the acute phase of infection. The lowest concentration of SARS-CoV-2 viral copies this assay can detect is 138 copies/mL. A negative result does not preclude SARS-Cov-2 infection and should not be used as the sole basis for treatment or other patient management decisions. A negative result may occur with  improper specimen collection/handling, submission of specimen other than nasopharyngeal swab, presence of viral mutation(s) within the areas targeted by this assay, and inadequate number of viral copies(<138 copies/mL). A negative result must be combined with clinical observations, patient history, and epidemiological information. The expected result is Negative.  Fact Sheet for Patients:  BloggerCourse.com  Fact Sheet for Healthcare Providers:  SeriousBroker.it  This test is no t yet approved or cleared by the [place] FDA and  has been authorized for detection and/or diagnosis of SARS-CoV-2 by FDA under an Emergency Use Authorization (EUA). This EUA will remain  in effect (meaning this test can be used) for the duration of the COVID-19 declaration under Section 564(b)(1) of the Act,  21 U.S.C.section 360bbb-3(b)(1), unless the authorization is terminated  or revoked sooner.       Influenza A by PCR NEGATIVE NEGATIVE Final   Influenza B by PCR NEGATIVE NEGATIVE Final    Comment: (NOTE) The Xpert Xpress SARS-CoV-2/FLU/RSV plus assay is intended as an aid in the diagnosis of influenza from Nasopharyngeal swab specimens and should not be used as a sole basis for treatment. Nasal washings and aspirates are unacceptable for Xpert Xpress SARS-CoV-2/FLU/RSV  testing.  Fact Sheet for Patients: BloggerCourse.com  Fact Sheet for Healthcare Providers: SeriousBroker.it  This test is not yet approved or cleared by the [place] FDA and has been authorized for detection and/or diagnosis of SARS-CoV-2 by FDA under an Emergency Use Authorization (EUA). This EUA will remain in effect (meaning this test can be used) for the duration of the COVID-19 declaration under Section 564(b)(1) of the Act, 21 U.S.C. section 360bbb-3(b)(1), unless the authorization is terminated or revoked.  Performed at [facility], [address], [place], [place] [address]           Radiology Studies: No results found.      Scheduled Meds:  brinzolamide  1 drop Left Eye BID   feeding supplement  237 mL Oral BID BM   latanoprost  1 drop Left Eye QHS   multivitamin with minerals  1 tablet Oral Daily   simvastatin  20 mg Oral Daily   traZODone  25 mg Oral QHS   venlafaxine XR  150 mg Oral Q breakfast   vitamin B-12  1,000 mcg Oral Daily   Continuous Infusions:  sodium chloride 50 mL/hr at 07/19/20 0814          Glade Lloyd, MD [facility] 07/19/2020, 9:53 AM

## 2020-07-20 DIAGNOSIS — F32A Depression, unspecified: Secondary | ICD-10-CM

## 2020-07-20 DIAGNOSIS — F331 Major depressive disorder, recurrent, moderate: Secondary | ICD-10-CM

## 2020-07-20 DIAGNOSIS — R45851 Suicidal ideations: Secondary | ICD-10-CM | POA: Diagnosis not present

## 2020-07-20 DIAGNOSIS — G259 Extrapyramidal and movement disorder, unspecified: Secondary | ICD-10-CM | POA: Diagnosis not present

## 2020-07-20 DIAGNOSIS — F332 Major depressive disorder, recurrent severe without psychotic features: Secondary | ICD-10-CM | POA: Diagnosis not present

## 2020-07-20 DIAGNOSIS — E871 Hypo-osmolality and hyponatremia: Secondary | ICD-10-CM | POA: Diagnosis not present

## 2020-07-20 DIAGNOSIS — G20 Parkinson's disease: Secondary | ICD-10-CM | POA: Diagnosis not present

## 2020-07-20 LAB — BASIC METABOLIC PANEL
Anion gap: 7 (ref 5–15)
BUN: 16 mg/dL (ref 8–23)
CO2: 27 mmol/L (ref 22–32)
Calcium: 9.4 mg/dL (ref 8.9–10.3)
Chloride: 99 mmol/L (ref 98–111)
Creatinine, Ser: 0.66 mg/dL (ref 0.44–1.00)
GFR, Estimated: >60 mL/min (ref >60)
Glucose, Bld: 94 mg/dL (ref 70–99)
Potassium: 4 mmol/L (ref 3.5–5.1)
Sodium: 133 mmol/L — ABNORMAL LOW (ref 135–145)

## 2020-07-20 LAB — URINALYSIS, ROUTINE W REFLEX MICROSCOPIC
Bilirubin Urine: NEGATIVE
Glucose, UA: NEGATIVE mg/dL
Hgb urine dipstick: NEGATIVE
Ketones, ur: NEGATIVE mg/dL
Leukocytes,Ua: NEGATIVE
Nitrite: NEGATIVE
Protein, ur: NEGATIVE mg/dL
Specific Gravity, Urine: 1.013 (ref 1.005–1.030)
pH: 7 (ref 5.0–8.0)

## 2020-07-20 LAB — MAGNESIUM: Magnesium: 2.1 mg/dL (ref 1.7–2.4)

## 2020-07-20 MED ORDER — VENLAFAXINE HCL ER 37.5 MG PO CP24
37.5000 mg | ORAL_CAPSULE | Freq: Every day | ORAL | Status: DC
  Administered 2020-07-28 – 2020-08-02 (×6): 37.5 mg via ORAL
  Filled 2020-07-20 (×7): qty 1

## 2020-07-20 MED ORDER — CYANOCOBALAMIN 1000 MCG/ML IJ SOLN
1000.0000 ug | Freq: Once | INTRAMUSCULAR | Status: AC
  Administered 2020-07-20: 1000 ug via INTRAMUSCULAR
  Filled 2020-07-20: qty 1

## 2020-07-20 MED ORDER — VENLAFAXINE HCL ER 75 MG PO CP24
75.0000 mg | ORAL_CAPSULE | Freq: Every day | ORAL | Status: AC
Start: 2020-07-21 — End: 2020-07-27
  Administered 2020-07-21 – 2020-07-27 (×7): 75 mg via ORAL
  Filled 2020-07-20 (×7): qty 1

## 2020-07-20 MED ORDER — MEGESTROL ACETATE 400 MG/10ML PO SUSP
400.0000 mg | Freq: Every day | ORAL | Status: DC
  Administered 2020-07-20 – 2020-08-02 (×13): 400 mg via ORAL
  Filled 2020-07-20 (×14): qty 10

## 2020-07-20 MED ORDER — FLUOXETINE HCL 20 MG PO CAPS: 20.0000 mg | ORAL_CAPSULE | Freq: Every day | ORAL | Status: DC

## 2020-07-20 MED ORDER — DEXTROSE-NACL 5-0.9 % IV SOLN: INTRAVENOUS | Status: DC

## 2020-07-20 MED ORDER — FLUOXETINE HCL 10 MG PO CAPS
10.0000 mg | ORAL_CAPSULE | Freq: Every day | ORAL | Status: AC
  Administered 2020-07-21 – 2020-07-27 (×7): 10 mg via ORAL
  Filled 2020-07-20 (×7): qty 1

## 2020-07-20 NOTE — Progress Notes (Signed)
Inpatient Rehab Admissions Coordinator:  Consult received. Attempted to contact pt via telephone.  Called pt's room and got no answer.  Will f/u at later date.   Wolfgang Phoenix, MS, CCC-SLP Admissions Coordinator [phone]

## 2020-07-20 NOTE — Consult Note (Signed)
[facility] Face-to-Face Psychiatry Consult   Reason for Consult:''severe depression/intermittent suicidal ideation'' Patient Identification: Sheila Andrade Neubecker MRN:  [id number] Principal Diagnosis: MDD (major depressive disorder), recurrent episode, severe (HCC) Diagnosis:  Principal Problem:   MDD (major depressive disorder), recurrent episode, severe (HCC) Active Problems:   Weight loss   Anemia   UTI (urinary tract infection)   Hyponatremia   Suicidal ideations   Dehydration   Total Time spent with patient: 30 minutes  Subjective/Objective: Patient is  85 y.o. female  was seen and evaluated face-to-face in the presence of her daughter, daughter-in-law Sheila Andrade at bedside and 2nd daughter listening in through the phone. Patient and family reports that patient has been dealing with depression which got worse after she fell. She has  had trial of multiple anti-depressant which include Lexapro, Mirtazapine-d/c due to excessive sedation and Abilify-d/c due to worsening hand tremors She is currently on Effexor XR and low dose trazodone. Patient reports favorable response to Trazodone but family and her have not observed any significant improvement with Effexor XR and requesting wean off. Family, patient and I agreed to a trial of Prozac in addition to low dose of Trazodone at bedtime. Family and patient also worry about patient's poor appetite with a noticeable weight loss. They agreed for me to discuss with Hospitalist to consider adding Megace for appetite stimulation.Today, patient denies suicidal ideation, intent or plan but reports occasional depression due to her medical issues. She reports that she was walking with a walker until last week and wonder if she will be able to do that again. ''I just want to get back to my normal life, I don't like putting my family through this." Patient  is pleasant, cooperative, denies psychosis, delusional thinking and previous suicide attempt. Daughter-in-law  reported patient has psychiatry appointment in September in [place] but may have to be followed by a psychiatrist in [place] depending on her disposition when she is medically cleared.   Past Psychiatric History: MDD  Risk to Self:  Denies Risk to Others:  denies Prior Inpatient Therapy:  none in the past Prior Outpatient Therapy:  yes  Past Medical History:  Past Medical History:  Diagnosis Date   Hot flashes 06/06/2020   Hypertension    History reviewed. No pertinent surgical history. Family History:  Family History  Problem Relation Age of Onset   Hypertension Other    Family Psychiatric  History:  Social History:  Social History   Substance and Sexual Activity  Alcohol Use None     Social History   Substance and Sexual Activity  Drug Use Never    Social History   Socioeconomic History   Marital status: Widowed    Spouse name: Not on file   Number of children: Not on file   Years of education: Not on file   Highest education level: Not on file  Occupational History   Not on file  Tobacco Use   Smoking status: Never   Smokeless tobacco: Never  Substance and Sexual Activity   Alcohol use: Not on file   Drug use: Never   Sexual activity: Not on file  Other Topics Concern   Not on file  Social History Narrative   Not on file   Social Determinants of Health   Financial Resource Strain: Not on file  Food Insecurity: Not on file  Transportation Needs: Not on file  Physical Activity: Not on file  Stress: Not on file  Social Connections: Not on file   Additional  Social History:    Allergies:  No Known Allergies  Labs:  Results for orders placed or performed during the hospital encounter of 07/14/20 (from the past 48 hour(s))  Basic metabolic panel     Status: Abnormal   Collection Time: 07/19/20  4:48 AM  Result Value Ref Range   Sodium 132 (L) 135 - 145 mmol/L   Potassium 3.8 3.5 - 5.1 mmol/L   Chloride 102 98 - 111 mmol/L   CO2 23 22 -  32 mmol/L   Glucose, Bld 94 70 - 99 mg/dL    Comment: Glucose reference range applies only to samples taken after fasting for at least 8 hours.   BUN 13 8 - 23 mg/dL   Creatinine, Ser 1.61 0.44 - 1.00 mg/dL   Calcium 8.9 8.9 - 09.6 mg/dL   GFR, Estimated >04 >54 mL/min    Comment: (NOTE) Calculated using the CKD-EPI Creatinine Equation (2021)    Anion gap 7 5 - 15    Comment: Performed at [facility], [address], [place], [place] [address]  Magnesium     Status: None   Collection Time: 07/19/20  4:48 AM  Result Value Ref Range   Magnesium 2.0 1.7 - 2.4 mg/dL    Comment: Performed at [facility], [address], [place], [place] [address]  Basic metabolic panel     Status: Abnormal   Collection Time: 07/20/20  5:38 AM  Result Value Ref Range   Sodium 133 (L) 135 - 145 mmol/L   Potassium 4.0 3.5 - 5.1 mmol/L   Chloride 99 98 - 111 mmol/L   CO2 27 22 - 32 mmol/L   Glucose, Bld 94 70 - 99 mg/dL    Comment: Glucose reference range applies only to samples taken after fasting for at least 8 hours.   BUN 16 8 - 23 mg/dL   Creatinine, Ser 2.95 0.44 - 1.00 mg/dL   Calcium 9.4 8.9 - 62.1 mg/dL   GFR, Estimated >30 >86 mL/min    Comment: (NOTE) Calculated using the CKD-EPI Creatinine Equation (2021)    Anion gap 7 5 - 15    Comment: Performed at [facility], [address] [place], [place] [address]  Magnesium     Status: None   Collection Time: 07/20/20  5:38 AM  Result Value Ref Range   Magnesium 2.1 1.7 - 2.4 mg/dL    Comment: Performed at [facility], [address] [place], [place] [address]    Current Facility-Administered Medications  Medication Dose Route Frequency Provider Last Rate Last Admin   acetaminophen (TYLENOL) tablet 650 mg  650 mg Oral Q6H PRN Therisa Doyne, MD   650 mg at 07/18/20 1112   Or   acetaminophen (TYLENOL) suppository 650 mg  650 mg Rectal Q6H PRN Doutova,  Anastassia, MD       brinzolamide (AZOPT) 1 % ophthalmic suspension 1 drop  1 drop Left Eye BID Doutova, Anastassia, MD   1 drop at 07/20/20 0926   dextrose 5 %-0.9 % sodium chloride infusion   Intravenous Continuous Hanley Ben, Kshitiz, MD 75 mL/hr at 07/20/20 1140 New Bag at 07/20/20 1140   feeding supplement (ENSURE ENLIVE / ENSURE PLUS) liquid 237 mL  237 mL Oral BID BM Doutova, Anastassia, MD   237 mL at 07/19/20 1059   [START ON 07/21/2020] FLUoxetine (PROZAC) capsule 10 mg  10 mg Oral Daily Thedore Mins, MD       [START ON  08/04/2020] FLUoxetine (PROZAC) capsule 20 mg  20 mg Oral Daily Ayanni Tun, MD       HYDROcodone-acetaminophen (NORCO/VICODIN) 5-325 MG per tablet 1-2 tablet  1-2 tablet Oral Q4H PRN Doutova, Anastassia, MD       latanoprost (XALATAN) 0.005 % ophthalmic solution 1 drop  1 drop Left Eye QHS Doutova, Anastassia, MD   1 drop at 07/19/20 2126   megestrol (MEGACE) 400 MG/10ML suspension 400 mg  400 mg Oral Daily Glade Lloyd, MD       multivitamin with minerals tablet 1 tablet  1 tablet Oral Daily Glade Lloyd, MD   1 tablet at 07/20/20 0916   polyethylene glycol (MIRALAX / GLYCOLAX) packet 17 g  17 g Oral Daily PRN Calvert Cantor, MD   17 g at 07/19/20 1058   simvastatin (ZOCOR) tablet 20 mg  20 mg Oral Daily Doutova, Anastassia, MD   20 mg at 07/20/20 0917   traZODone (DESYREL) tablet 25 mg  25 mg Oral QHS Doutova, Anastassia, MD   25 mg at 07/19/20 2126   [START ON 07/28/2020] venlafaxine XR (EFFEXOR-XR) 24 hr capsule 37.5 mg  37.5 mg Oral Q breakfast Isaiyah Feldhaus, MD       [START ON 07/21/2020] venlafaxine XR (EFFEXOR-XR) 24 hr capsule 75 mg  75 mg Oral Q breakfast Titilayo Hagans, MD        Musculoskeletal: Strength & Muscle Tone: within normal limits Gait & Station: normal Patient leans: N/A    Psychiatric Specialty Exam:  Presentation  General Appearance: Appropriate for Environment; Casual  Eye Contact:Minimal  Speech:Slow; Clear and  Coherent  Speech Volume:Decreased  Handedness:Right   Mood and Affect  Mood:Depressed; Dysphoric  Affect:constricted  Thought Process  Thought Processes:Coherent; Linear; Goal Directed  Descriptions of Associations:Intact  Orientation:Full (Time, Place and Person)  Thought Content:Logical  History of Schizophrenia/Schizoaffective disorder:denies Duration of Psychotic Symptoms:None Hallucinations:No data recorded Ideas of Reference:None  Suicidal Thoughts:denies Homicidal Thoughts:denies  Sensorium  Memory:Recent Good; Immediate Good; Remote Good  Judgment:Fair  Insight:Fair   Executive Functions  Concentration:Fair  Attention Span:Good  Recall:Good  Fund of Knowledge:Good  Language:Good   Psychomotor Activity  Psychomotor Activity: slowed  Assets  Assets:Desire for Improvement; Manufacturing systems engineer; Housing; Research scientist (medical); Resilience; Physical Health; Leisure Time; Vocational/Educational; Talents/Skills; Transportation; Financial Resources/Insurance   Sleep  Sleep: Good on low dose Trazodone  Physical Exam: Physical Exam Vitals reviewed.  Neurological:     Mental Status: She is alert.  Psychiatric:        Attention and Perception: Attention and perception normal.        Mood and Affect: Mood is depressed.        Speech: Speech normal.        Behavior: Behavior normal. Behavior is cooperative.        Thought Content: Thought content normal.        Cognition and Memory: Cognition and memory normal.        Judgment: Judgment normal.   ROS Blood pressure 133/76, pulse 85, temperature 97.6 F (36.4 C), temperature source Oral, resp. rate 18, height 5\' 7"  (1.702 m), weight 51.3 kg, SpO2 96 %. Body mass index is 17.7 kg/m.  Treatment Plan Summary: 85 year old female with depression secondary to medical issues. Today, she denies psychosis, delusions, self harming thoughts but reports poor appetite and being depressed due to her current medical  condition. She and her family agreed to medication adjustment.  Recommendations: -Wean patient of Effexor XR and cross taper with Prozac: decrease  Effexor XR to 75 mg  on 07/21/20 x 1 week, then 37.5 mg x 1 week and stop. Start Prozac 10 mg on 07/21/20 x 1 week, then increase to 20 mg daily for depression -Hospitalist to consider adding Megace for appetite stimulation. -Continue Trazodone 25 mg at bedtime for sleep - Patient will benefit from outpatient psychiatry referral for medication management upon discharge -Consider TOC/social worker consult to assist with transitional care planning when patient is medically stable  Disposition: No evidence of imminent risk to self or others at present.   Patient does not meet criteria for psychiatric inpatient admission. Supportive therapy provided about ongoing stressors. Psychiatric consult service will follow  Thedore Mins, MD 07/20/2020 2:02 PM

## 2020-07-20 NOTE — Evaluation (Signed)
Clinical/Bedside Swallow Evaluation Patient Details  Name: Sheila Andrade MRN: [id number] Date of Birth: 28-Mar-1935  Today's Date: 07/20/2020 Time: SLP Start Time (ACUTE ONLY): 1255 SLP Stop Time (ACUTE ONLY): 1320 SLP Time Calculation (min) (ACUTE ONLY): 25 min  Past Medical History:  Past Medical History:  Diagnosis Date   Hot flashes 06/06/2020   Hypertension    Past Surgical History: History reviewed. No pertinent surgical history. HPI:  85 yo female admitted 07/14/20 with failure to thrive, fatigue, weakness, inability to stand. Pt also with suicidal ideation.BSE 7/11 without pt complaints of dysphagia but decreased appetite and no s/s aspiration. UGI 01/08/20 Mild tertiary contractions of the distal esophagus are noted  suggesting presbyesophagus. Being worked up for possible neurological disorder. PMH: recurrent UTI, recent COVID, glaucoma, osteoporosis, HLD, depression   Assessment / Plan / Recommendation Clinical Impression  Bedside swallow completed with suspicion for a primary esophageal dysphagia. Daughters report 2 months of intermittent coughing with meals and decreased intake and concern for neurological changes 7/10 (shuffling gait, posterior lean, tremors) instigating this admission. MRI negative for acute changes. No indications for dysphagia during BSE 7/11 although yesterday was unable to take larger pills with liquid as normal requiring crushing. Ms Tunnell reports frequent premature satiety during meals and intermittent gagging. EGD 07/09/20 revealed tortuous distal esophagus, normal gastroesophageal junction. Bolus cohesion, mastication and transit were timely and efficient. Pharyngeal phase was overall unremarkable with mild occasional hesitancy before swallow observed. Pt is being transferred to [facility] per RN/daughters for neurological work up. Therapist recommends continue regular texture, thin, pills whole in applesauce, esophageal precautions and brief ST follow up SLP Visit  Diagnosis: Dysphagia, unspecified (R13.10)    Aspiration Risk  Mild aspiration risk    Diet Recommendation Regular;Thin liquid   Liquid Administration via: Cup;Straw Medication Administration: Whole meds with puree Supervision: Patient able to self feed Compensations: Slow rate;Small sips/bites Postural Changes: Seated upright at 90 degrees;Remain upright for at least 30 minutes after po intake    Other  Recommendations Oral Care Recommendations: Oral care BID   Follow up Recommendations  (TBD)      Frequency and Duration min 1 x/week  2 weeks       Prognosis Prognosis for Safe Diet Advancement: Good      Swallow Study   General Date of Onset: 07/18/20 HPI: 85 yo female admitted 07/14/20 with failure to thrive, fatigue, weakness, inability to stand. Pt also with suicidal ideation.BSE 7/11 without pt complaints of dysphagia but decreased appetite and no s/s aspiration. UGI 01/08/20 Mild tertiary contractions of the distal esophagus are noted  suggesting presbyesophagus. Being worked up for possible neurological disorder. PMH: recurrent UTI, recent COVID, glaucoma, osteoporosis, HLD, depression Type of Study: Bedside Swallow Evaluation Previous Swallow Assessment: see HPI Diet Prior to this Study: Regular;Thin liquids Temperature Spikes Noted: No Respiratory Status: Room air History of Recent Intubation: No Behavior/Cognition: Alert;Cooperative;Pleasant mood Oral Cavity Assessment: Within Functional Limits Oral Care Completed by SLP: No Oral Cavity - Dentition: Dentures, top;Other (Comment) (natural lower) Vision: Functional for self-feeding Self-Feeding Abilities: Able to feed self Patient Positioning: Upright in bed Baseline Vocal Quality: Normal Volitional Cough: Strong Volitional Swallow: Able to elicit    Oral/Motor/Sensory Function Overall Oral Motor/Sensory Function: Other (comment) (? slight left asymmetry with smile/at rest)   Ice Chips Ice chips: Not tested    Thin Liquid Thin Liquid: Within functional limits Presentation: Cup;Straw    Nectar Thick Nectar Thick Liquid: Not tested   Honey Thick Honey Thick Liquid: Not tested  Puree Puree: Within functional limits   Solid     Solid: Within functional limits      Royce Macadamia 07/20/2020,2:20 PM  Breck Coons Lon[place] Face.Ed Nurse, children's [phone] Office [phone]

## 2020-07-20 NOTE — Progress Notes (Signed)
Neurology Progress Note  Patient ID: Sheila Andrade is a 85 y.o. with PMHx of  has a past medical history of Hot flashes (06/06/2020) and Hypertension.  Initially consulted for: Parkinsonian features   Major interval events:  -Abilify discontinued on 7/13  Subjective: -Feels terrible about everything -"Not yet" when asked if she's seeing things others aren't -No new acute pain  -Doesn't feel any change in ability to move  Exam: Vitals:   07/20/20 0520 07/20/20 1313  BP: (!) 149/73 133/76  Pulse: 76 85  Resp: 20 18  Temp: 98.3 F (36.8 C) 97.6 F (36.4 C)  SpO2: 98% 96%   Gen: In bed, comfortable  Resp: non-labored breathing, no grossly audible wheezing Cardiac: Perfusing extremities well  Abd: soft, nt Psych: affect remains flat, but less masked than on my initial evaluation Extremities: Bilateral feet in extreme plantarflexion with toes dorsiflexed concerning for Charcot feet  Neuro: MS: Aware, alert, oriented  CN: Face symmetric, tongue midline, EOMI with saccadic pursuits Motor: Marked improvement in cogwheeling compared to my initial evaluation.  Patient is not as severely bradykinetic.  Tone remains increased throughout but less so than prior Sensory: Equally reactive to light touch throughout DTR: 3+ and symmetric patellar's and brachioradialis  Pertinent Labs:  Basic Metabolic Panel: Recent Labs  Lab 07/15/20 0000 07/15/20 0331 07/16/20 0443 07/17/20 0451 07/18/20 0457 07/19/20 0448 07/20/20 0538  NA  --  135 133* 131* 131* 132* 133*  K  --  3.9 4.2 4.5 4.4 3.8 4.0  CL  --  102 102 100 100 102 99  CO2  --  26 24 24 24 23 27   GLUCOSE  --  93 100* 85 83 94 94  BUN  --  12 21 16 14 13 16   CREATININE  --  0.56 0.53 0.61 0.53 0.66  CALCIUM  --  9.3 9.2 9.2 9.2 8.9 9.4  MG 2.1 2.1  --   --  2.1 2.0 2.1  PHOS 4.0 3.5  --   --   --   --   --     CBC: Recent Labs  Lab 07/14/20 2025 07/15/20 0331 07/18/20 0457  WBC 7.5 7.6 6.9  NEUTROABS 4.8 5.3  4.0  HGB 12.4 12.6 12.7  HCT 39.1 39.3 39.6  MCV 89.5 88.3 89.6  PLT 349 319 315    Coagulation Studies: No results for input(s): LABPROT, INR in the last 72 hours.   Hyponatremia remains stable and not severe, CBC remained stable and within normal limits  Impression: This is an 85 year old woman suffering significant depression however with marked improvement of parkinsonian features off of Abilify.  Continue to feel she may have an atypical Parkinson's syndrome which is best evaluated by outpatient neurology.  Given the findings of her lower extremities there may be significant neuropathy as well, that would require outpatient EMG nerve conduction study to clarify.  She does appear to be very sensitive to even the mildest D2 receptor blockers, and again I defer management of her mood symptoms to psychiatry given their severity.  Given her hyperreflexia and progressive gait impairment, MRI cervical spine and thoracic spine would be recommended to rule out a compressive process.  However family is concerned that the patient would not be willing to undergo this procedure and would like to discuss it with her in between themselves before proceeding  Recommendations: -Please order MRI cervical and thoracic spine without contrast if family and patient decide to pursue this -Outpatient referrals to neurology  and neuropsychology placed at the time of my initial consultation  Brooke Dare MD-PhD [facility] [phone]   Greater than 35 minutes were spent in care of this patient today, including a 20-minute conversation with Ms. Karena Addison (MS) discussing her concerns as well as 15 minutes at bedside and discussion with primary team.

## 2020-07-20 NOTE — Progress Notes (Signed)
Patient ID: Sheila Andrade, female   DOB: 1935-03-16, 85 y.o.   MRN: [id number]  PROGRESS NOTE    Sheila Andrade  ZRA:[id number] DOB: [birth year] DOA: 07/14/2020 PCP: Dalphine Handing, MD   Brief Narrative:  85 y.o. female with a history of depression who presented to the ED with siucidal ideation in the setting of continued functional decline, debility associated with tremors, bradykinesia.  She was initially started on Rocephin for possible UTI but subsequently stopped.  Psychiatry initially recommended inpatient psychiatric hospitalization but subsequently patient was no longer suicidal and hence psychiatry recommended outpatient follow-up.  Neurology was also consulted for possible movement disorder.  Assessment & Plan:   Severe depression Suicidal ideation: Resolved - Psychiatry initially recommended inpatient psychiatric hospitalization but subsequently patient was no longer suicidal and hence psychiatry recommended outpatient follow-up. -Suicide precautions/sitter have been discontinued -Currently on Effexor and trazodone.  Abilify has been stopped by psychiatry.  Psychiatry followed the patient on 07/19/2020 and recommended to continue Effexor and trazodone with outpatient follow-up with psychiatry. -Family is concerned that patient has expressed suicidal ideations again.  They would want to speak to the psychiatrist on-call.  I have put in a consult for psychiatry and have spoken to Dr. Lenore Cordia on phone on 07/20/2020  Weight loss, failure to thrive, dehydration -Functionally worsening since fall/pelvic fracture in January 2022 which required rehab following which she has been living with son and daughter-in-law in [place] -Oral intake still poor.  Follow nutrition recommendations -DC'd IV fluids on 07/19/2020.  Family is concerned that her oral intake has gotten worse.  They are requesting to restart IV fluids.  We will put her on D5 normal saline.  Mild hyponatremia -Sodium 133 today.   Monitor  ?  Movement disorder - Several overlapping clinical features between Parkinsonian disorder and severe depression/pseudodementia. -Neurology evaluation appreciated: Neurology recommends to maintain vitamin B12 above 400.  Vitamin B12 was 254 on presentation.  We will switch supplementation to parenteral while she is in the hospital -MRI brain showed moderate microvascular ischemic disease, mild to moderate atrophy, no acute abnormalities -Family is concerned that patient's overall neurological symptoms have gotten worse: Mobility is getting worse and now has difficulty swallowing.  SLP reevaluation.  Family requested neurology evaluation and transfer to [facility] for the same.  Communicated with Dr. Bhagat/neurology via secure chat on 07/19/2020 and initiated transfer process to [facility].  Awaiting transfer.  Follow further recommendations from neurology. -PT is now recommending CIR.  CIR consult has been placed.  Abnormal urinalysis -No pyuria; Rocephin has been stopped - will repeat UA since her condition has gotten worse and family is requesting repeat urinalysis  DVT prophylaxis: SCDs Code Status: Full Family Communication: Daughter-in-law/[name], daughter/Liz at bedside and daughter/Mary on phone on 07/20/2020  disposition Plan: Status is: Inpatient  Remains inpatient appropriate because:Inpatient level of care appropriate due to severity of illness  Dispo: The patient is from: Home              Anticipated d/c is to: CIR              Patient currently is not medically stable to d/c.   Difficult to place patient Yes   Consultants: Neurology/psychiatry  Procedures: None  Antimicrobials:  Anti-infectives (From admission, onward)    Start     Dose/Rate Route Frequency Ordered Stop   07/15/20 2000  cefTRIAXone (ROCEPHIN) 1 g in sodium chloride 0.9 % 100 mL IVPB  Status:  Discontinued  1 g 200 mL/hr over 30 Minutes Intravenous Every 24 hours 07/15/20 0000 07/15/20  1635   07/14/20 2315  cefTRIAXone (ROCEPHIN) 1 g in sodium chloride 0.9 % 100 mL IVPB        1 g 200 mL/hr over 30 Minutes Intravenous  Once 07/14/20 2307 07/15/20 0040        Subjective: Patient seen and examined at bedside.  Spoke to family members at bedside who are very concerned about patient's very poor oral intake.  Patient apparently has been saying that she wants to die as per the nursing staff.  No overnight fever or vomiting reported.   Objective: Vitals:   07/19/20 0453 07/19/20 1306 07/19/20 2019 07/20/20 0520  BP: (!) 151/82 139/78 137/75 (!) 149/73  Pulse: 82 78 82 76  Resp: 18 18 14 20   Temp: 98.4 F (36.9 C) (!) 97.5 F (36.4 C) 98.8 F (37.1 C) 98.3 F (36.8 C)  TempSrc: Oral Oral Oral Oral  SpO2: 97% 100% 96% 98%  Weight:      Height:        Intake/Output Summary (Last 24 hours) at 07/20/2020 0824 Last data filed at 07/20/2020 0500 Gross per 24 hour  Intake 354 ml  Output 400 ml  Net -46 ml    Filed Weights   07/14/20 1755  Weight: 51.3 kg    Examination:  General exam: No acute distress.  On room air currently.  Looks chronically ill and deconditioned.  Extremely flat affect.  Respiratory system: Decreased breath sounds at bases bilaterally with some scattered crackles  cardiovascular system: Rate controlled, S1-S2 heard  gastrointestinal system: Abdomen is mildly distended, soft and nontender.  Bowel sounds are heard Extremities: No clubbing or edema  Data Reviewed: I have personally reviewed following labs and imaging studies  CBC: Recent Labs  Lab 07/14/20 2025 07/15/20 0331 07/18/20 0457  WBC 7.5 7.6 6.9  NEUTROABS 4.8 5.3 4.0  HGB 12.4 12.6 12.7  HCT 39.1 39.3 39.6  MCV 89.5 88.3 89.6  PLT 349 319 315    Basic Metabolic Panel: Recent Labs  Lab 07/15/20 0000 07/15/20 0331 07/16/20 0443 07/17/20 0451 07/18/20 0457 07/19/20 0448 07/20/20 0538  NA  --  135 133* 131* 131* 132* 133*  K  --  3.9 4.2 4.5 4.4 3.8 4.0  CL  --   102 102 100 100 102 99  CO2  --  26 24 24 24 23 27   GLUCOSE  --  93 100* 85 83 94 94  BUN  --  12 21 16 14 13 16   CREATININE  --  0.56 0.69 0.53 0.61 0.53 0.66  CALCIUM  --  9.3 9.2 9.2 9.2 8.9 9.4  MG 2.1 2.1  --   --  2.1 2.0 2.1  PHOS 4.0 3.5  --   --   --   --   --     GFR: Estimated Creatinine Clearance: 43.9 mL/min (by C-G formula based on SCr of 0.66 mg/dL). Liver Function Tests: Recent Labs  Lab 07/14/20 2025 07/15/20 0331  AST 20 17  ALT 15 16  ALKPHOS 59 58  BILITOT 0.6 0.4  PROT 7.2 6.9  ALBUMIN 4.0 3.9    No results for input(s): LIPASE, AMYLASE in the last 168 hours. Recent Labs  Lab 07/14/20 2025  AMMONIA 28    Coagulation Profile: No results for input(s): INR, PROTIME in the last 168 hours. Cardiac Enzymes: Recent Labs  Lab 07/15/20 0030  CKTOTAL 59  BNP (last 3 results) No results for input(s): PROBNP in the last 8760 hours. HbA1C: No results for input(s): HGBA1C in the last 72 hours. CBG: No results for input(s): GLUCAP in the last 168 hours. Lipid Profile: No results for input(s): CHOL, HDL, LDLCALC, TRIG, CHOLHDL, LDLDIRECT in the last 72 hours. Thyroid Function Tests: No results for input(s): TSH, T4TOTAL, FREET4, T3FREE, THYROIDAB in the last 72 hours.  Anemia Panel: No results for input(s): VITAMINB12, FOLATE, FERRITIN, TIBC, IRON, RETICCTPCT in the last 72 hours.  Sepsis Labs: Recent Labs  Lab 07/14/20 2025 07/15/20 0105  LATICACIDVEN 1.1 0.6     Recent Results (from the past 240 hour(s))  Urine culture     Status: Abnormal   Collection Time: 07/14/20  6:49 PM   Specimen: Urine, Random  Result Value Ref Range Status   Specimen Description   Final    URINE, RANDOM Performed at [facility], [address] [place], [place] [address]    Special Requests   Final    NONE Performed at [facility], [address] [place], [place] [address]    Culture (A)  Final    >=100,000  COLONIES/mL KLEBSIELLA ORNITHINOLYTICA >=100,000 COLONIES/mL KLEBSIELLA OXYTOCA    Report Status 07/17/2020 FINAL  Final   Organism ID, Bacteria KLEBSIELLA ORNITHINOLYTICA (A)  Final   Organism ID, Bacteria KLEBSIELLA OXYTOCA (A)  Final      Susceptibility   Klebsiella ornithinolytica - MIC*    AMPICILLIN >=32 RESISTANT Resistant     CEFAZOLIN 16 SENSITIVE Sensitive     CEFEPIME <=0.12 SENSITIVE Sensitive     CEFTRIAXONE <=0.25 SENSITIVE Sensitive     CIPROFLOXACIN <=0.25 SENSITIVE Sensitive     GENTAMICIN <=1 SENSITIVE Sensitive     IMIPENEM 0.5 SENSITIVE Sensitive     NITROFURANTOIN 32 SENSITIVE Sensitive     TRIMETH/SULFA <=20 SENSITIVE Sensitive     AMPICILLIN/SULBACTAM 16 INTERMEDIATE Intermediate     PIP/TAZO <=4 SENSITIVE Sensitive     * >=100,000 COLONIES/mL KLEBSIELLA ORNITHINOLYTICA   Klebsiella oxytoca - MIC*    AMPICILLIN RESISTANT Resistant     CEFAZOLIN <=4 SENSITIVE Sensitive     CEFEPIME <=0.12 SENSITIVE Sensitive     CEFTRIAXONE <=0.25 SENSITIVE Sensitive     CIPROFLOXACIN <=0.25 SENSITIVE Sensitive     GENTAMICIN <=1 SENSITIVE Sensitive     IMIPENEM <=0.25 SENSITIVE Sensitive     NITROFURANTOIN <=16 SENSITIVE Sensitive     TRIMETH/SULFA <=20 SENSITIVE Sensitive     AMPICILLIN/SULBACTAM <=2 SENSITIVE Sensitive     PIP/TAZO <=4 SENSITIVE Sensitive     * >=100,000 COLONIES/mL KLEBSIELLA OXYTOCA  Resp Panel by RT-PCR (Flu A&B, Covid) Nasopharyngeal Swab     Status: None   Collection Time: 07/14/20  8:25 PM   Specimen: Nasopharyngeal Swab; Nasopharyngeal(NP) swabs in vial transport medium  Result Value Ref Range Status   SARS Coronavirus 2 by RT PCR NEGATIVE NEGATIVE Final    Comment: (NOTE) SARS-CoV-2 target nucleic acids are NOT DETECTED.  The SARS-CoV-2 RNA is generally detectable in upper respiratory specimens during the acute phase of infection. The lowest concentration of SARS-CoV-2 viral copies this assay can detect is 138 copies/mL. A negative result  does not preclude SARS-Cov-2 infection and should not be used as the sole basis for treatment or other patient management decisions. A negative result may occur with  improper specimen collection/handling, submission of specimen other than nasopharyngeal swab, presence of viral mutation(s) within the areas targeted by this assay, and inadequate number  of viral copies(<138 copies/mL). A negative result must be combined with clinical observations, patient history, and epidemiological information. The expected result is Negative.  Fact Sheet for Patients:  BloggerCourse.com  Fact Sheet for Healthcare Providers:  SeriousBroker.it  This test is no t yet approved or cleared by the [place] FDA and  has been authorized for detection and/or diagnosis of SARS-CoV-2 by FDA under an Emergency Use Authorization (EUA). This EUA will remain  in effect (meaning this test can be used) for the duration of the COVID-19 declaration under Section 564(b)(1) of the Act, 21 U.S.C.section 360bbb-3(b)(1), unless the authorization is terminated  or revoked sooner.       Influenza A by PCR NEGATIVE NEGATIVE Final   Influenza B by PCR NEGATIVE NEGATIVE Final    Comment: (NOTE) The Xpert Xpress SARS-CoV-2/FLU/RSV plus assay is intended as an aid in the diagnosis of influenza from Nasopharyngeal swab specimens and should not be used as a sole basis for treatment. Nasal washings and aspirates are unacceptable for Xpert Xpress SARS-CoV-2/FLU/RSV testing.  Fact Sheet for Patients: BloggerCourse.com  Fact Sheet for Healthcare Providers: SeriousBroker.it  This test is not yet approved or cleared by the [place] FDA and has been authorized for detection and/or diagnosis of SARS-CoV-2 by FDA under an Emergency Use Authorization (EUA). This EUA will remain in effect (meaning this test can be used) for  the duration of the COVID-19 declaration under Section 564(b)(1) of the Act, 21 U.S.C. section 360bbb-3(b)(1), unless the authorization is terminated or revoked.  Performed at [facility], [address], [place], [place] [address]           Radiology Studies: No results found.      Scheduled Meds:  brinzolamide  1 drop Left Eye BID   feeding supplement  237 mL Oral BID BM   latanoprost  1 drop Left Eye QHS   multivitamin with minerals  1 tablet Oral Daily   simvastatin  20 mg Oral Daily   traZODone  25 mg Oral QHS   venlafaxine XR  150 mg Oral Q breakfast   vitamin B-12  1,000 mcg Oral Daily   Continuous Infusions:          Glade Lloyd, MD [facility] 07/20/2020, 8:24 AM

## 2020-07-21 DIAGNOSIS — F331 Major depressive disorder, recurrent, moderate: Secondary | ICD-10-CM | POA: Diagnosis not present

## 2020-07-21 DIAGNOSIS — F332 Major depressive disorder, recurrent severe without psychotic features: Secondary | ICD-10-CM | POA: Diagnosis not present

## 2020-07-21 DIAGNOSIS — F32A Depression, unspecified: Secondary | ICD-10-CM | POA: Diagnosis not present

## 2020-07-21 DIAGNOSIS — R45851 Suicidal ideations: Secondary | ICD-10-CM | POA: Diagnosis not present

## 2020-07-21 LAB — COMPREHENSIVE METABOLIC PANEL
ALT: 13 U/L (ref 0–44)
AST: 19 U/L (ref 15–41)
Albumin: 3.4 g/dL — ABNORMAL LOW (ref 3.5–5.0)
Alkaline Phosphatase: 60 U/L (ref 38–126)
Anion gap: 7 (ref 5–15)
BUN: 11 mg/dL (ref 8–23)
CO2: 26 mmol/L (ref 22–32)
Calcium: 9.1 mg/dL (ref 8.9–10.3)
Chloride: 101 mmol/L (ref 98–111)
Creatinine, Ser: 0.67 mg/dL (ref 0.44–1.00)
GFR, Estimated: >60 mL/min (ref >60)
Glucose, Bld: 116 mg/dL — ABNORMAL HIGH (ref 70–99)
Potassium: 3.8 mmol/L (ref 3.5–5.1)
Sodium: 134 mmol/L — ABNORMAL LOW (ref 135–145)
Total Bilirubin: 0.3 mg/dL (ref 0.3–1.2)
Total Protein: 6.5 g/dL (ref 6.5–8.1)

## 2020-07-21 LAB — MAGNESIUM: Magnesium: 2 mg/dL (ref 1.7–2.4)

## 2020-07-21 LAB — CBC WITH DIFFERENTIAL/PLATELET
Abs Immature Granulocytes: 0.02 10*3/uL (ref 0.00–0.07)
Basophils Absolute: 0.1 10*3/uL (ref 0.0–0.1)
Basophils Relative: 1 %
Eosinophils Absolute: 0.1 10*3/uL (ref 0.0–0.5)
Eosinophils Relative: 2 %
HCT: 40.1 % (ref 36.0–46.0)
Hemoglobin: 12.6 g/dL (ref 12.0–15.0)
Immature Granulocytes: 0 %
Lymphocytes Relative: 27 %
Lymphs Abs: 1.6 10*3/uL (ref 0.7–4.0)
MCH: 28.1 pg (ref 26.0–34.0)
MCHC: 31.4 g/dL (ref 30.0–36.0)
MCV: 89.5 fL (ref 80.0–100.0)
Monocytes Absolute: 0.6 10*3/uL (ref 0.1–1.0)
Monocytes Relative: 9 %
Neutro Abs: 3.7 10*3/uL (ref 1.7–7.7)
Neutrophils Relative %: 61 %
Platelets: 286 10*3/uL (ref 150–400)
RBC: 4.48 MIL/uL (ref 3.87–5.11)
RDW: 14.3 % (ref 11.5–15.5)
WBC: 6 10*3/uL (ref 4.0–10.5)
nRBC: 0 % (ref 0.0–0.2)

## 2020-07-21 MED ORDER — CYANOCOBALAMIN 1000 MCG/ML IJ SOLN
1000.0000 ug | Freq: Every day | INTRAMUSCULAR | Status: AC
  Administered 2020-07-21 – 2020-07-25 (×5): 1000 ug via INTRAMUSCULAR
  Filled 2020-07-21 (×7): qty 1

## 2020-07-21 MED ORDER — ENOXAPARIN SODIUM 40 MG/0.4ML IJ SOSY
40.0000 mg | PREFILLED_SYRINGE | INTRAMUSCULAR | Status: DC
  Administered 2020-07-21 – 2020-08-02 (×13): 40 mg via SUBCUTANEOUS
  Filled 2020-07-21 (×13): qty 0.4

## 2020-07-21 NOTE — Progress Notes (Signed)
Patient ID: Sheila Andrade, female   DOB: 11/26/1935, 85 y.o.   MRN: [id number]  PROGRESS NOTE    Sheila Andrade  WUJ:[id number] DOB: 11/26/1935 DOA: 07/14/2020 PCP: Dalphine HandingShah, Alden Rose, MD   Brief Narrative:  85 y.o. female with a history of depression who presented to the ED with siucidal ideation in the setting of continued functional decline, debility associated with tremors, bradykinesia.  She was initially started on Rocephin for possible UTI but subsequently stopped.  Psychiatry initially recommended inpatient psychiatric hospitalization but subsequently patient was no longer suicidal and hence psychiatry recommended outpatient follow-up.  Neurology was also consulted for possible movement disorder.  Assessment & Plan:   Severe depression Suicidal ideation: Resolved - Psychiatry initially recommended inpatient psychiatric hospitalization but subsequently patient was no longer suicidal and hence psychiatry recommended outpatient follow-up. -Suicide precautions/sitter have been discontinued -Currently on Effexor and trazodone.  Abilify has been stopped by psychiatry.  Psychiatry followed the patient on 07/19/2020 and recommended to continue Effexor and trazodone with outpatient follow-up with psychiatry. -Psychiatry evaluated the patient on 07/20/2020 and recommended to taper off Effexor and start Prozac  Weight loss, failure to thrive, dehydration -Functionally worsening since fall/pelvic fracture in January 2022 which required rehab following which she has been living with son and daughter-in-law in [place] -Oral intake still poor.  Follow nutrition recommendations -DC'd IV fluids on 07/19/2020.  IV fluids restarted on 07/20/2020 because of family's concern for patient's continued poor oral intake.  Mild hyponatremia -Sodium 134 today.  Monitor  ?  Movement disorder - Several overlapping clinical features between Parkinsonian disorder and severe depression/pseudodementia. -Neurology  evaluation appreciated: Neurology recommends to maintain vitamin B12 above 400.  Vitamin B12 was 254 on presentation.  Continue parenteral supplementation while she is in the hospital. -MRI brain showed moderate microvascular ischemic disease, mild to moderate atrophy, no acute abnormalities -Family is concerned that patient's overall neurological symptoms have gotten worse.  Family has requested that patient be [facility] continued neurology follow.  This is pending. -Neurology evaluated patient on 07/20/2020 and recommended outpatient follow-up with neurology in regards to starting medications for?  Parkinson's.  Neurology recommended MRI of cervical spine and thoracic spine but family wanted to discuss symptoms as prior to proceeding.  Patient undecided so far.  Follow further neurology recommendations. -PT is now recommending CIR.  CIR consult has been placed.  Abnormal urinalysis -No pyuria; Rocephin has been stopped -Repeat UA on 07/20/2020 was negative for nitrites or leukocyte esterase  DVT prophylaxis: Start Lovenox. Code Status: Full Family Communication: Granddaughters at bedside on 07/21/2020  disposition Plan: Status is: Inpatient  Remains inpatient appropriate because:Inpatient level of care appropriate due to severity of illness  Dispo: The patient is from: Home              Anticipated d/c is to: CIR              Patient currently is not medically stable to d/c.   Difficult to place patient Yes   Consultants: Neurology/psychiatry  Procedures: None  Antimicrobials:  Anti-infectives (From admission, onward)    Start     Dose/Rate Route Frequency Ordered Stop   07/15/20 2000  cefTRIAXone (ROCEPHIN) 1 g in sodium chloride 0.9 % 100 mL IVPB  Status:  Discontinued        1 g 200 mL/hr over 30 Minutes Intravenous Every 24 hours 07/15/20 0000 07/15/20 1635   07/14/20 2315  cefTRIAXone (ROCEPHIN) 1 g in sodium chloride 0.9 %  100 mL IVPB        1  g 200 mL/hr over 30 Minutes Intravenous  Once 07/14/20 2307 07/15/20 0040        Subjective: Patient seen and examined at bedside.  No overnight fever, worsening shortness of breath or vomiting reported.  Feels slightly better today and apparently ate half of her breakfast.  Objective: Vitals:   07/20/20 0520 07/20/20 1313 07/20/20 2005 07/21/20 0455  BP: (!) 149/73 133/76 (!) 143/81 (!) 147/82  Pulse: 76 85 83 79  Resp: 20 18 20 16   Temp: 98.3 F (36.8 C) 97.6 F (36.4 C) 98.4 F (36.9 C) 98 F (36.7 C)  TempSrc: Oral Oral Oral Oral  SpO2: 98% 96% 98% 98%  Weight:      Height:        Intake/Output Summary (Last 24 hours) at 07/21/2020 0804 Last data filed at 07/21/2020 0500 Gross per 24 hour  Intake 830.95 ml  Output 700 ml  Net 130.95 ml    Filed Weights   07/14/20 1755  Weight: 51.3 kg    Examination:  General exam: Still on room air.  No distress.   Looks chronically ill and deconditioned.  Little more communicative today.  Respiratory system: Bilateral decreased breath sounds at bases  cardiovascular system: S1-S2 heard, rate controlled  gastrointestinal system: Abdomen is distended mildly, soft and nontender.  Normal bowel sounds heard Extremities: No cyanosis or clubbing  Data Reviewed: I have personally reviewed following labs and imaging studies  CBC: Recent Labs  Lab 07/14/20 2025 07/15/20 0331 07/18/20 0457 07/21/20 0544  WBC 7.5 7.6 6.9 6.0  NEUTROABS 4.8 5.3 4.0 3.7  HGB 12.4 12.6 12.7 12.6  HCT 39.1 39.3 39.6 40.1  MCV 89.5 88.3 89.6 89.5  PLT 349 319 315 286    Basic Metabolic Panel: Recent Labs  Lab 07/15/20 0000 07/15/20 0331 07/16/20 0443 07/17/20 0451 07/18/20 0457 07/19/20 0448 07/20/20 0538 07/21/20 0544  NA  --  135   < > 131* 131* 132* 133* 134*  K  --  3.9   < > 4.5 4.4 3.8 4.0 3.8  CL  --  102   < > 100 100 102 99 101  CO2  --  26   < > 24 24 23 27 26   GLUCOSE  --  93   < > 85 83 94 94 116*  BUN  --  12   < > 16 14  13 16 11   CREATININE  --  0.56   < > 0.53 0.61 0.53 0.66 0.67  CALCIUM  --  9.3   < > 9.2 9.2 8.9 9.4 9.1  MG 2.1 2.1  --   --  2.1 2.0 2.1 2.0  PHOS 4.0 3.5  --   --   --   --   --   --    < > = values in this interval not displayed.    GFR: Estimated Creatinine Clearance: 43.9 mL/min (by C-G formula based on SCr of 0.67 mg/dL). Liver Function Tests: Recent Labs  Lab 07/14/20 2025 07/15/20 0331 07/21/20 0544  AST 20 17 19   ALT 15 16 13   ALKPHOS 59 58 60  BILITOT 0.6 0.4 0.3  PROT 7.2 6.9 6.5  ALBUMIN 4.0 3.9 3.4*    No results for input(s): LIPASE, AMYLASE in the last 168 hours. Recent Labs  Lab 07/14/20 2025  AMMONIA 28    Coagulation Profile: No results for input(s): INR, PROTIME in the last  168 hours. Cardiac Enzymes: Recent Labs  Lab 07/15/20 0030  CKTOTAL 59    BNP (last 3 results) No results for input(s): PROBNP in the last 8760 hours. HbA1C: No results for input(s): HGBA1C in the last 72 hours. CBG: No results for input(s): GLUCAP in the last 168 hours. Lipid Profile: No results for input(s): CHOL, HDL, LDLCALC, TRIG, CHOLHDL, LDLDIRECT in the last 72 hours. Thyroid Function Tests: No results for input(s): TSH, T4TOTAL, FREET4, T3FREE, THYROIDAB in the last 72 hours.  Anemia Panel: No results for input(s): VITAMINB12, FOLATE, FERRITIN, TIBC, IRON, RETICCTPCT in the last 72 hours.  Sepsis Labs: Recent Labs  Lab 07/14/20 2025 07/15/20 0105  LATICACIDVEN 1.1 0.6     Recent Results (from the past 240 hour(s))  Urine culture     Status: Abnormal   Collection Time: 07/14/20  6:49 PM   Specimen: Urine, Random  Result Value Ref Range Status   Specimen Description   Final    URINE, RANDOM Performed at [facility], [address], [place], [place] [address]    Special Requests   Final    NONE Performed at [facility], [address], [place], [place] [address]    Culture (A)  Final    >=100,000  COLONIES/mL KLEBSIELLA ORNITHINOLYTICA >=100,000 COLONIES/mL KLEBSIELLA OXYTOCA    Report Status 07/17/2020 FINAL  Final   Organism ID, Bacteria KLEBSIELLA ORNITHINOLYTICA (A)  Final   Organism ID, Bacteria KLEBSIELLA OXYTOCA (A)  Final      Susceptibility   Klebsiella ornithinolytica - MIC*    AMPICILLIN >=32 RESISTANT Resistant     CEFAZOLIN 16 SENSITIVE Sensitive     CEFEPIME <=0.12 SENSITIVE Sensitive     CEFTRIAXONE <=0.25 SENSITIVE Sensitive     CIPROFLOXACIN <=0.25 SENSITIVE Sensitive     GENTAMICIN <=1 SENSITIVE Sensitive     IMIPENEM 0.5 SENSITIVE Sensitive     NITROFURANTOIN 32 SENSITIVE Sensitive     TRIMETH/SULFA <=20 SENSITIVE Sensitive     AMPICILLIN/SULBACTAM 16 INTERMEDIATE Intermediate     PIP/TAZO <=4 SENSITIVE Sensitive     * >=100,000 COLONIES/mL KLEBSIELLA ORNITHINOLYTICA   Klebsiella oxytoca - MIC*    AMPICILLIN RESISTANT Resistant     CEFAZOLIN <=4 SENSITIVE Sensitive     CEFEPIME <=0.12 SENSITIVE Sensitive     CEFTRIAXONE <=0.25 SENSITIVE Sensitive     CIPROFLOXACIN <=0.25 SENSITIVE Sensitive     GENTAMICIN <=1 SENSITIVE Sensitive     IMIPENEM <=0.25 SENSITIVE Sensitive     NITROFURANTOIN <=16 SENSITIVE Sensitive     TRIMETH/SULFA <=20 SENSITIVE Sensitive     AMPICILLIN/SULBACTAM <=2 SENSITIVE Sensitive     PIP/TAZO <=4 SENSITIVE Sensitive     * >=100,000 COLONIES/mL KLEBSIELLA OXYTOCA  Resp Panel by RT-PCR (Flu A&B, Covid) Nasopharyngeal Swab     Status: None   Collection Time: 07/14/20  8:25 PM   Specimen: Nasopharyngeal Swab; Nasopharyngeal(NP) swabs in vial transport medium  Result Value Ref Range Status   SARS Coronavirus 2 by RT PCR NEGATIVE NEGATIVE Final    Comment: (NOTE) SARS-CoV-2 target nucleic acids are NOT DETECTED.  The SARS-CoV-2 RNA is generally detectable in upper respiratory specimens during the acute phase of infection. The lowest concentration of SARS-CoV-2 viral copies this assay can detect is 138 copies/mL. A negative result  does not preclude SARS-Cov-2 infection and should not be used as the sole basis for treatment or other patient management decisions. A negative result may occur with  improper specimen collection/handling, submission of specimen other than  nasopharyngeal swab, presence of viral mutation(s) within the areas targeted by this assay, and inadequate number of viral copies(<138 copies/mL). A negative result must be combined with clinical observations, patient history, and epidemiological information. The expected result is Negative.  Fact Sheet for Patients:  BloggerCourse.com  Fact Sheet for Healthcare Providers:  SeriousBroker.it  This test is no t yet approved or cleared by the [place] FDA and  has been authorized for detection and/or diagnosis of SARS-CoV-2 by FDA under an Emergency Use Authorization (EUA). This EUA will remain  in effect (meaning this test can be used) for the duration of the COVID-19 declaration under Section 564(b)(1) of the Act, 21 U.S.C.section 360bbb-3(b)(1), unless the authorization is terminated  or revoked sooner.       Influenza A by PCR NEGATIVE NEGATIVE Final   Influenza B by PCR NEGATIVE NEGATIVE Final    Comment: (NOTE) The Xpert Xpress SARS-CoV-2/FLU/RSV plus assay is intended as an aid in the diagnosis of influenza from Nasopharyngeal swab specimens and should not be used as a sole basis for treatment. Nasal washings and aspirates are unacceptable for Xpert Xpress SARS-CoV-2/FLU/RSV testing.  Fact Sheet for Patients: BloggerCourse.com  Fact Sheet for Healthcare Providers: SeriousBroker.it  This test is not yet approved or cleared by the [place] FDA and has been authorized for detection and/or diagnosis of SARS-CoV-2 by FDA under an Emergency Use Authorization (EUA). This EUA will remain in effect (meaning this test can be used) for  the duration of the COVID-19 declaration under Section 564(b)(1) of the Act, 21 U.S.C. section 360bbb-3(b)(1), unless the authorization is terminated or revoked.  Performed at [facility], [address], [place], [place] [address]           Radiology Studies: No results found.      Scheduled Meds:  brinzolamide  1 drop Left Eye BID   feeding supplement  237 mL Oral BID BM   FLUoxetine  10 mg Oral Daily   [START ON 08/04/2020] FLUoxetine  20 mg Oral Daily   latanoprost  1 drop Left Eye QHS   megestrol  400 mg Oral Daily   multivitamin with minerals  1 tablet Oral Daily   simvastatin  20 mg Oral Daily   traZODone  25 mg Oral QHS   [START ON 07/28/2020] venlafaxine XR  37.5 mg Oral Q breakfast   venlafaxine XR  75 mg Oral Q breakfast   Continuous Infusions:  dextrose 5 % and 0.9% NaCl 75 mL/hr at 07/21/20 0051           Glade Lloyd, MD [facility] 07/21/2020, 8:04 AM

## 2020-07-21 NOTE — Consult Note (Signed)
[name] Face-to-Face Psychiatry Consult   Reason for Consult:''severe depression/intermittent suicidal ideation'' Patient Identification: Sheila Andrade MRN:  [id number] Principal Diagnosis: MDD (major depressive disorder), recurrent episode, severe (HCC) Diagnosis:  Principal Problem:   MDD (major depressive disorder), recurrent episode, severe (HCC) Active Problems:   Weight loss   Anemia   UTI (urinary tract infection)   Hyponatremia   Suicidal ideations   Dehydration   Total Time spent with patient: 20 minutes  Subjective/Objective: Patient seen today face to face in her hospital room. She states that she is doing better and feels optimistic about her current plan/recommendations. She reports ongoing depression but denies psychosis, delusions and self harming thoughts.  Past Psychiatric History: MDD  Risk to Self:  Denies Risk to Others:  denies Prior Inpatient Therapy:  none in the past Prior Outpatient Therapy:  yes  Past Medical History:  Past Medical History:  Diagnosis Date   Hot flashes 06/06/2020   Hypertension    History reviewed. No pertinent surgical history. Family History:  Family History  Problem Relation Age of Onset   Hypertension Other    Family Psychiatric  History:  Social History:  Social History   Substance and Sexual Activity  Alcohol Use None     Social History   Substance and Sexual Activity  Drug Use Never    Social History   Socioeconomic History   Marital status: Widowed    Spouse name: Not on file   Number of children: Not on file   Years of education: Not on file   Highest education level: Not on file  Occupational History   Not on file  Tobacco Use   Smoking status: Never   Smokeless tobacco: Never  Substance and Sexual Activity   Alcohol use: Not on file   Drug use: Never   Sexual activity: Not on file  Other Topics Concern   Not on file  Social History Narrative   Not on file   Social Determinants of Health    Financial Resource Strain: Not on file  Food Insecurity: Not on file  Transportation Needs: Not on file  Physical Activity: Not on file  Stress: Not on file  Social Connections: Not on file   Additional Social History:    Allergies:  No Known Allergies  Labs:  Results for orders placed or performed during the hospital encounter of 07/14/20 (from the past 48 hour(s))  Basic metabolic panel     Status: Abnormal   Collection Time: 07/20/20  5:38 AM  Result Value Ref Range   Sodium 133 (L) 135 - 145 mmol/L   Potassium 4.0 3.5 - 5.1 mmol/L   Chloride 99 98 - 111 mmol/L   CO2 27 22 - 32 mmol/L   Glucose, Bld 94 70 - 99 mg/dL    Comment: Glucose reference range applies only to samples taken after fasting for at least 8 hours.   BUN 16 8 - 23 mg/dL   Creatinine, Ser 4.65 0.44 - 1.00 mg/dL   Calcium 9.4 8.9 - 03.5 mg/dL   GFR, Estimated >46 >56 mL/min    Comment: (NOTE) Calculated using the CKD-EPI Creatinine Equation (2021)    Anion gap 7 5 - 15    Comment: Performed at [facility], [address] [place], [place] [address]  Magnesium     Status: None   Collection Time: 07/20/20  5:38 AM  Result Value Ref Range   Magnesium 2.1 1.7 - 2.4 mg/dL    Comment:  Performed at [facility], [address] [place], [place] [address]  Urinalysis, Routine w reflex microscopic Urine, Clean Catch     Status: Abnormal   Collection Time: 07/20/20  5:59 PM  Result Value Ref Range   Color, Urine YELLOW YELLOW   APPearance HAZY (A) CLEAR   Specific Gravity, Urine 1.013 1.005 - 1.030   pH 7.0 5.0 - 8.0   Glucose, UA NEGATIVE NEGATIVE mg/dL   Hgb urine dipstick NEGATIVE NEGATIVE   Bilirubin Urine NEGATIVE NEGATIVE   Ketones, ur NEGATIVE NEGATIVE mg/dL   Protein, ur NEGATIVE NEGATIVE mg/dL   Nitrite NEGATIVE NEGATIVE   Leukocytes,Ua NEGATIVE NEGATIVE    Comment: Performed at [facility], [address] [place], [place] [address]   CBC with Differential/Platelet     Status: None   Collection Time: 07/21/20  5:44 AM  Result Value Ref Range   WBC 6.0 4.0 - 10.5 K/uL   RBC 4.48 3.87 - 5.11 MIL/uL   Hemoglobin 12.6 12.0 - 15.0 g/dL   HCT 86.7 67.2 - 09.4 %   MCV 89.5 80.0 - 100.0 fL   MCH 28.1 26.0 - 34.0 pg   MCHC 31.4 30.0 - 36.0 g/dL   RDW 70.9 62.8 - 36.6 %   Platelets 286 150 - 400 K/uL   nRBC 0.0 0.0 - 0.2 %   Neutrophils Relative % 61 %   Neutro Abs 3.7 1.7 - 7.7 K/uL   Lymphocytes Relative 27 %   Lymphs Abs 1.6 0.7 - 4.0 K/uL   Monocytes Relative 9 %   Monocytes Absolute 0.6 0.1 - 1.0 K/uL   Eosinophils Relative 2 %   Eosinophils Absolute 0.1 0.0 - 0.5 K/uL   Basophils Relative 1 %   Basophils Absolute 0.1 0.0 - 0.1 K/uL   Immature Granulocytes 0 %   Abs Immature Granulocytes 0.02 0.00 - 0.07 K/uL    Comment: Performed at [facility], [address], [place], [place] [address]  Comprehensive metabolic panel     Status: Abnormal   Collection Time: 07/21/20  5:44 AM  Result Value Ref Range   Sodium 134 (L) 135 - 145 mmol/L   Potassium 3.8 3.5 - 5.1 mmol/L   Chloride 101 98 - 111 mmol/L   CO2 26 22 - 32 mmol/L   Glucose, Bld 116 (H) 70 - 99 mg/dL    Comment: Glucose reference range applies only to samples taken after fasting for at least 8 hours.   BUN 11 8 - 23 mg/dL   Creatinine, Ser 5.46 0.44 - 1.00 mg/dL   Calcium 9.1 8.9 - 50.3 mg/dL   Total Protein 6.5 6.5 - 8.1 g/dL   Albumin 3.4 (L) 3.5 - 5.0 g/dL   AST 19 15 - 41 U/L   ALT 13 0 - 44 U/L   Alkaline Phosphatase 60 38 - 126 U/L   Total Bilirubin 0.3 0.3 - 1.2 mg/dL   GFR, Estimated >54 >65 mL/min    Comment: (NOTE) Calculated using the CKD-EPI Creatinine Equation (2021)    Anion gap 7 5 - 15    Comment: Performed at [facility], [address], [place], [place] [address]  Magnesium     Status: None   Collection Time: 07/21/20  5:44 AM  Result Value Ref Range   Magnesium 2.0 1.7 - 2.4 mg/dL     Comment: Performed at [facility], [address], [place], [place] [address]  Current Facility-Administered Medications  Medication Dose Route Frequency Provider Last Rate Last Admin   acetaminophen (TYLENOL) tablet 650 mg  650 mg Oral Q6H PRN Therisa Doyneoutova, Anastassia, MD   650 mg at 07/18/20 1112   Or   acetaminophen (TYLENOL) suppository 650 mg  650 mg Rectal Q6H PRN Doutova, Anastassia, MD       brinzolamide (AZOPT) 1 % ophthalmic suspension 1 drop  1 drop Left Eye BID Therisa Doyneoutova, Anastassia, MD   1 drop at 07/21/20 0854   cyanocobalamin ((VITAMIN B-12)) injection 1,000 mcg  1,000 mcg Intramuscular Q0600 Alekh, Kshitiz, MD       dextrose 5 %-0.9 % sodium chloride infusion   Intravenous Continuous Glade LloydAlekh, Kshitiz, MD 75 mL/hr at 07/21/20 0051 New Bag at 07/21/20 0051   enoxaparin (LOVENOX) injection 40 mg  40 mg Subcutaneous Q24H Alekh, Kshitiz, MD       feeding supplement (ENSURE ENLIVE / ENSURE PLUS) liquid 237 mL  237 mL Oral BID BM Doutova, Anastassia, MD   237 mL at 07/21/20 0843   FLUoxetine (PROZAC) capsule 10 mg  10 mg Oral Daily Purnell Daigle, MD   10 mg at 07/21/20 0841   [START ON 08/04/2020] FLUoxetine (PROZAC) capsule 20 mg  20 mg Oral Daily Jeffrey Graefe, MD       HYDROcodone-acetaminophen (NORCO/VICODIN) 5-325 MG per tablet 1-2 tablet  1-2 tablet Oral Q4H PRN Doutova, Anastassia, MD       latanoprost (XALATAN) 0.005 % ophthalmic solution 1 drop  1 drop Left Eye QHS Doutova, Anastassia, MD   1 drop at 07/20/20 2101   megestrol (MEGACE) 400 MG/10ML suspension 400 mg  400 mg Oral Daily Glade LloydAlekh, Kshitiz, MD   400 mg at 07/21/20 [id number]   multivitamin with minerals tablet 1 tablet  1 tablet Oral Daily Glade LloydAlekh, Kshitiz, MD   1 tablet at 07/21/20 0842   polyethylene glycol (MIRALAX / GLYCOLAX) packet 17 g  17 g Oral Daily PRN Calvert Cantorizwan, Saima, MD   17 g at 07/19/20 1058   simvastatin (ZOCOR) tablet 20 mg  20 mg Oral Daily Doutova, Anastassia, MD   20 mg at 07/21/20 0841    traZODone (DESYREL) tablet 25 mg  25 mg Oral QHS Doutova, Anastassia, MD   25 mg at 07/20/20 2058   [START ON 07/28/2020] venlafaxine XR (EFFEXOR-XR) 24 hr capsule 37.5 mg  37.5 mg Oral Q breakfast Xaiver Roskelley, MD       venlafaxine XR (EFFEXOR-XR) 24 hr capsule 75 mg  75 mg Oral Q breakfast Mailey Landstrom, MD   75 mg at 07/21/20 [id number]    Musculoskeletal: Strength & Muscle Tone: within normal limits Gait & Station: normal Patient leans: N/A    Psychiatric Specialty Exam:  Presentation  General Appearance: Appropriate for Environment; Casual  Eye Contact:Minimal  Speech:Slow; Clear and Coherent  Speech Volume:Decreased  Handedness:Right   Mood and Affect  Mood:Depressed; Dysphoric  Affect:constricted  Thought Process  Thought Processes:Coherent; Linear; Goal Directed  Descriptions of Associations:Intact  Orientation:Full (Time, Place and Person)  Thought Content:Logical  History of Schizophrenia/Schizoaffective disorder:denies Duration of Psychotic Symptoms:None Hallucinations:No data recorded Ideas of Reference:None  Suicidal Thoughts:denies Homicidal Thoughts:denies  Sensorium  Memory:Recent Good; Immediate Good; Remote Good  Judgment:Fair  Insight:Fair   Executive Functions  Concentration:Fair  Attention Span:Good  Recall:Good  Fund of Knowledge:Good  Language:Good   Psychomotor Activity  Psychomotor Activity: slowed  Assets  Assets:Desire for Improvement; Manufacturing systems engineerCommunication Skills; Housing; Research scientist (medical)ocial Support; Resilience; Physical Health; Leisure Time; Vocational/Educational; Talents/Skills; Transportation; Financial Resources/Insurance  Sleep  Sleep: Good on low dose Trazodone  Physical Exam: Physical Exam Vitals reviewed.  Neurological:     Mental Status: She is alert.  Psychiatric:        Attention and Perception: Attention and perception normal.        Mood and Affect: Mood is depressed.        Speech: Speech normal.         Behavior: Behavior normal. Behavior is cooperative.        Thought Content: Thought content normal.        Cognition and Memory: Cognition and memory normal.        Judgment: Judgment normal.   ROS Blood pressure (!) 147/82, pulse 79, temperature 98 F (36.7 C), temperature source Oral, resp. rate 16, height 5\' 7"  (1.702 m), weight 51.3 kg, SpO2 98 %. Body mass index is 17.7 kg/m.  Treatment Plan Summary: 85 year old female with depression secondary to medical issues. Today, she  continues to deny psychosis, delusions, self harming thoughts but reports ongoing  poor appetite and mild to moderate depression  due to her current medical condition.   Recommendations: -Wean patient of Effexor XR and cross taper with Prozac: decrease Effexor XR to 75 mg  on 07/21/20 x 1 week, then 37.5 mg x 1 week and stop. Start Prozac 10 mg on 07/21/20 x 1 week, then increase to 20 mg daily for depression -Continue Megace 400 mg daily for appetite stimulation. -Continue Trazodone 25 mg at bedtime for sleep - Patient will benefit from outpatient psychiatry referral for medication management upon discharge -TOC/social worker consult to assist with transitional care planning when patient is medically stable  Disposition: No evidence of imminent risk to self or others at present.   Patient does not meet criteria for psychiatric inpatient admission. Supportive therapy provided about ongoing stressors. Psychiatric consult service will follow  07/23/20, MD 07/21/2020 1:07 PM

## 2020-07-21 NOTE — Progress Notes (Addendum)
Inpatient Rehab Admissions Coordinator:  Attempted to contact pt's daughter, Corrie Dandy.  There was no answer.  Left a message. Awaiting a return call.  Will continue to follow.   Addendum: Spoke with Corrie Dandy. Explained CIR goals and expectations. She acknowledged understanding. She is interested in pt pursuing CIR.  She confirmed that she and other family and friends will be able to assist pt after discharge. Will continue to follow.   Wolfgang Phoenix, MS, CCC-SLP Admissions Coordinator [phone]

## 2020-07-21 NOTE — Progress Notes (Signed)
RN called to give report to RN at [facility] 3W. Informed they will call back when available.

## 2020-07-21 NOTE — Progress Notes (Signed)
Report called to [facility]  3W Room 32 to assigned RN Delice Bison.

## 2020-07-21 NOTE — Progress Notes (Signed)
RN called back to give report to the night shift RN. RN was not available. Provided number for return call.

## 2020-07-22 ENCOUNTER — Inpatient Hospital Stay (HOSPITAL_COMMUNITY): Payer: Medicare PPO

## 2020-07-22 DIAGNOSIS — E860 Dehydration: Secondary | ICD-10-CM | POA: Diagnosis not present

## 2020-07-22 DIAGNOSIS — G20 Parkinson's disease: Secondary | ICD-10-CM

## 2020-07-22 DIAGNOSIS — F332 Major depressive disorder, recurrent severe without psychotic features: Secondary | ICD-10-CM | POA: Diagnosis not present

## 2020-07-22 LAB — TSH: TSH: 1.38 u[IU]/mL (ref 0.350–4.500)

## 2020-07-22 LAB — COMPREHENSIVE METABOLIC PANEL
ALT: 12 U/L (ref 0–44)
AST: 13 U/L — ABNORMAL LOW (ref 15–41)
Albumin: 2.8 g/dL — ABNORMAL LOW (ref 3.5–5.0)
Alkaline Phosphatase: 50 U/L (ref 38–126)
Anion gap: 6 (ref 5–15)
BUN: 19 mg/dL (ref 8–23)
CO2: 22 mmol/L (ref 22–32)
Calcium: 8.9 mg/dL (ref 8.9–10.3)
Chloride: 107 mmol/L (ref 98–111)
Creatinine, Ser: 0.63 mg/dL (ref 0.44–1.00)
GFR, Estimated: >60 mL/min (ref >60)
Glucose, Bld: 146 mg/dL — ABNORMAL HIGH (ref 70–99)
Potassium: 3.6 mmol/L (ref 3.5–5.1)
Sodium: 135 mmol/L (ref 135–145)
Total Bilirubin: 0.3 mg/dL (ref 0.3–1.2)
Total Protein: 5 g/dL — ABNORMAL LOW (ref 6.5–8.1)

## 2020-07-22 LAB — CBC WITH DIFFERENTIAL/PLATELET
Abs Immature Granulocytes: 0.02 10*3/uL (ref 0.00–0.07)
Basophils Absolute: 0.1 10*3/uL (ref 0.0–0.1)
Basophils Relative: 1 %
Eosinophils Absolute: 0.1 10*3/uL (ref 0.0–0.5)
Eosinophils Relative: 2 %
HCT: 32.4 % — ABNORMAL LOW (ref 36.0–46.0)
Hemoglobin: 10.3 g/dL — ABNORMAL LOW (ref 12.0–15.0)
Immature Granulocytes: 0 %
Lymphocytes Relative: 25 %
Lymphs Abs: 1.6 10*3/uL (ref 0.7–4.0)
MCH: 28.3 pg (ref 26.0–34.0)
MCHC: 31.8 g/dL (ref 30.0–36.0)
MCV: 89 fL (ref 80.0–100.0)
Monocytes Absolute: 0.6 10*3/uL (ref 0.1–1.0)
Monocytes Relative: 10 %
Neutro Abs: 4 10*3/uL (ref 1.7–7.7)
Neutrophils Relative %: 62 %
Platelets: 297 10*3/uL (ref 150–400)
RBC: 3.64 MIL/uL — ABNORMAL LOW (ref 3.87–5.11)
RDW: 14.4 % (ref 11.5–15.5)
WBC: 6.4 10*3/uL (ref 4.0–10.5)
nRBC: 0 % (ref 0.0–0.2)

## 2020-07-22 LAB — MAGNESIUM: Magnesium: 1.9 mg/dL (ref 1.7–2.4)

## 2020-07-22 IMAGING — MR MR THORACIC SPINE WO/W CM
9 of 12 series · 17 of 48 positions shown · IV contrast (gadavist)
Comparison: None.

CLINICAL DATA: Hyper-reflexia and abnormal gait

EXAM:
MRI CERVICAL AND THORACIC SPINE WITHOUT AND WITH CONTRAST
TECHNIQUE: Multiplanar and multiecho pulse sequences of the cervical spine, to
include the craniocervical junction and cervicothoracic junction,
and the thoracic spine, were obtained without and with intravenous
contrast.
CONTRAST:  5mL GADAVIST GADOBUTROL 1 MMOL/ML IV SOLN

[Series 17: T1 · sagittal · 3.0mm · 0.62mm/px · 1 of 9 slices shown (1 of 3)]
[im 1/9]
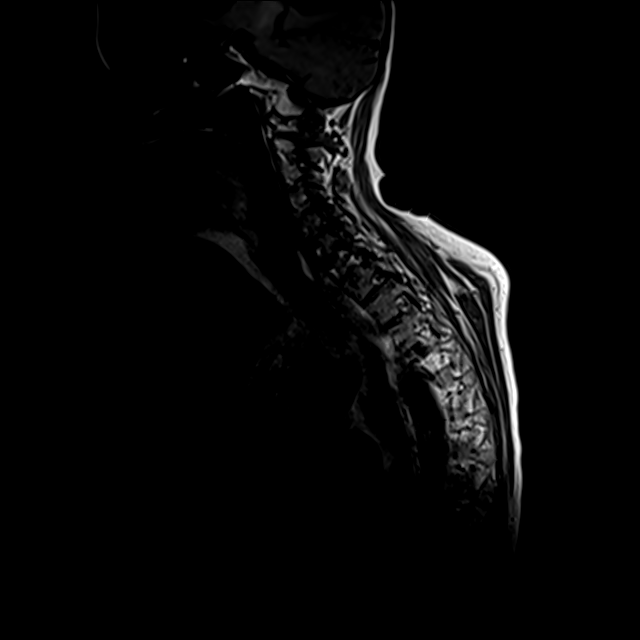

[Series 19: T2 · sagittal · 3.0mm · 0.76mm/px · 1 of 17 slices shown (1 of 2)]
[im 1/17]
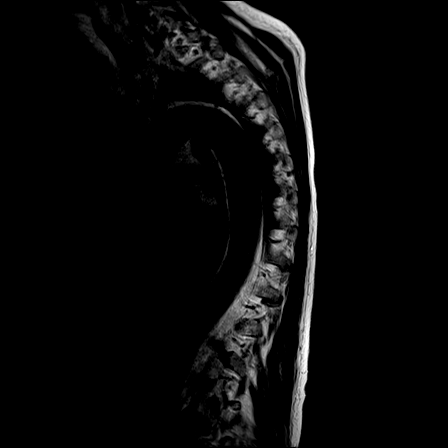

[Series 20: T1 · sagittal · 3.0mm · 0.76mm/px · 1 of 17 slices shown (2 of 3)]
[im 1/17]
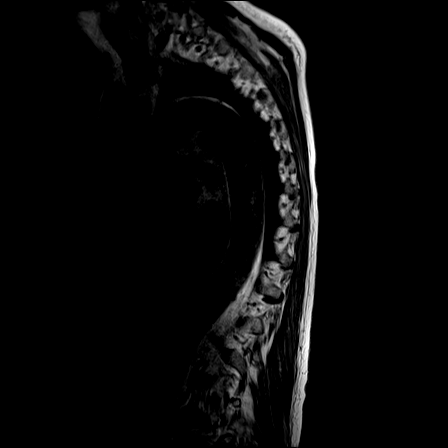

[Series 21: STIR · sagittal · 3.0mm · 0.38mm/px · 1 of 17 slices shown]
[im 1/17]
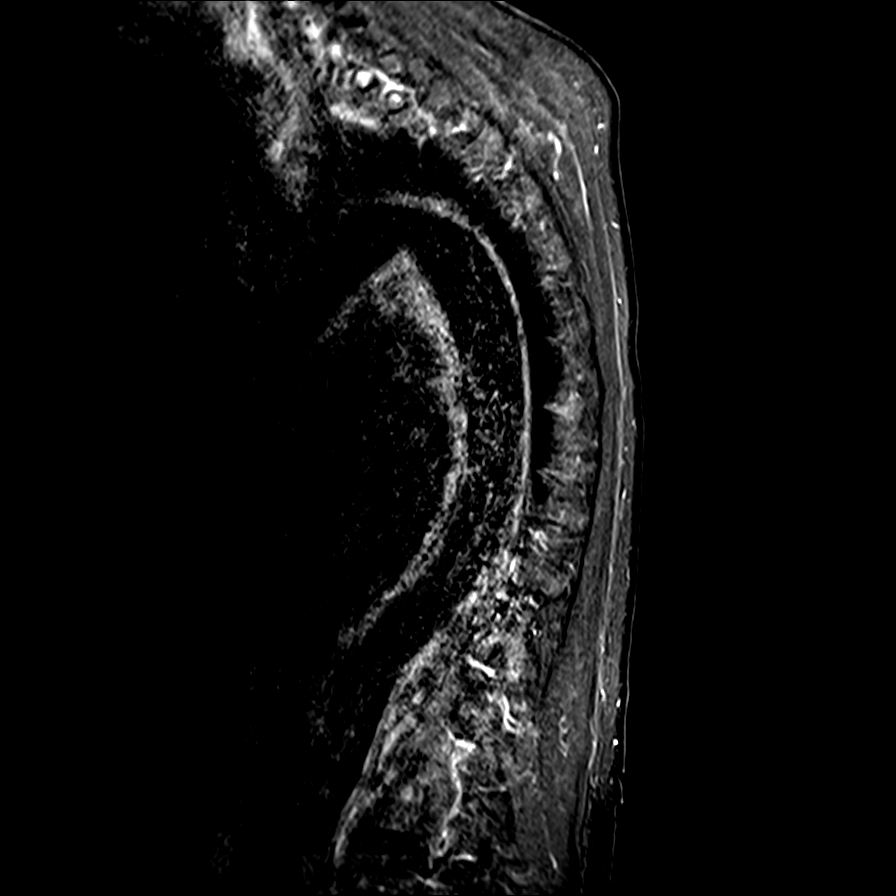

[Series 22: T2 · axial · 5.0mm · 0.70mm/px · z∈[-316,-41]mm · 3 of 38 slices shown (2 of 2)]
[im 1/38]
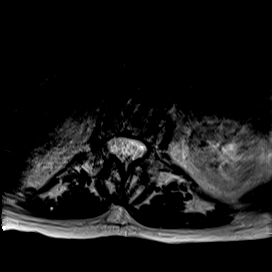
[im 19/38]
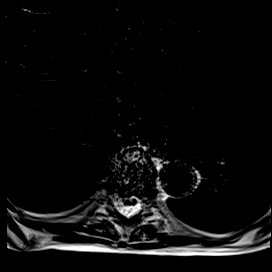
[im 38/38]
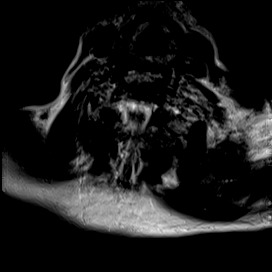

[Series 23: t2_me2d_tra · axial · 5.0mm · 0.37mm/px · z∈[-316,-41]mm · 3 of 38 slices shown]
[im 1/38]
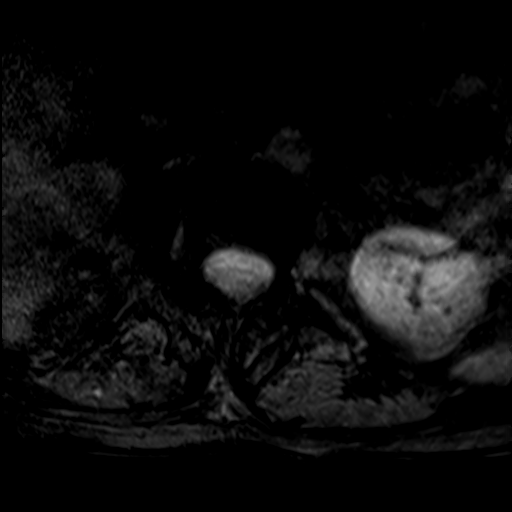
[im 19/38]
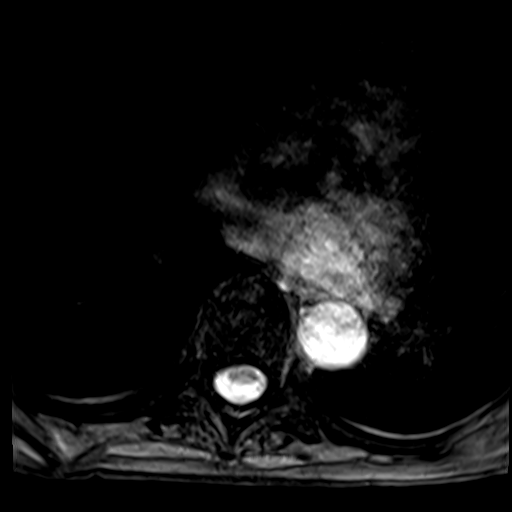
[im 38/38]
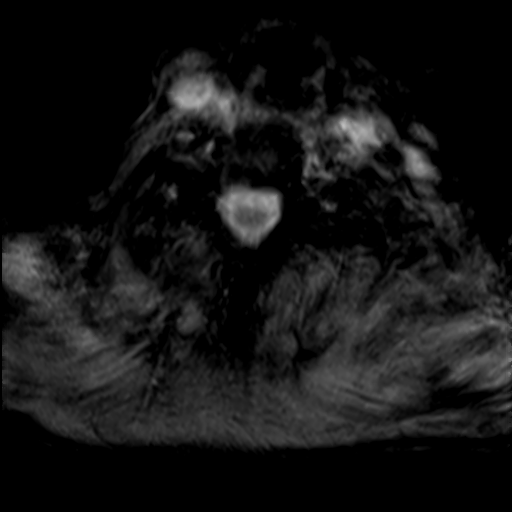

[Series 24: T1 · axial · non-contrast · 5.0mm · 0.35mm/px · z∈[-316,-41]mm · 3 of 38 slices shown (3 of 3)]
[im 1/38]
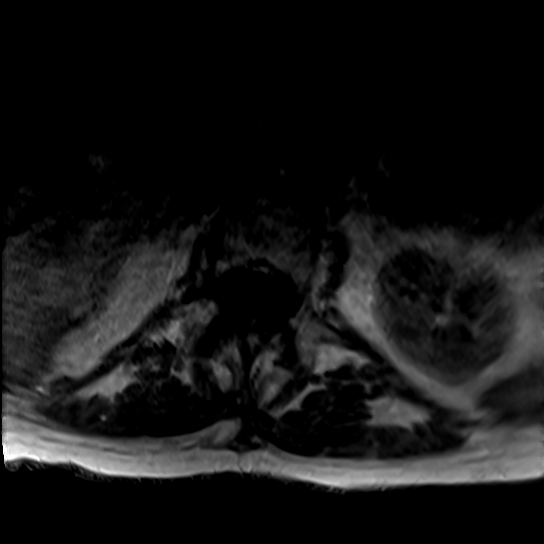
[im 19/38]
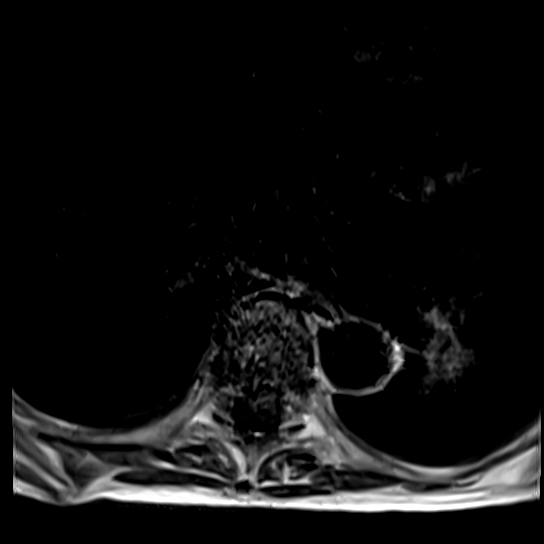
[im 38/38]
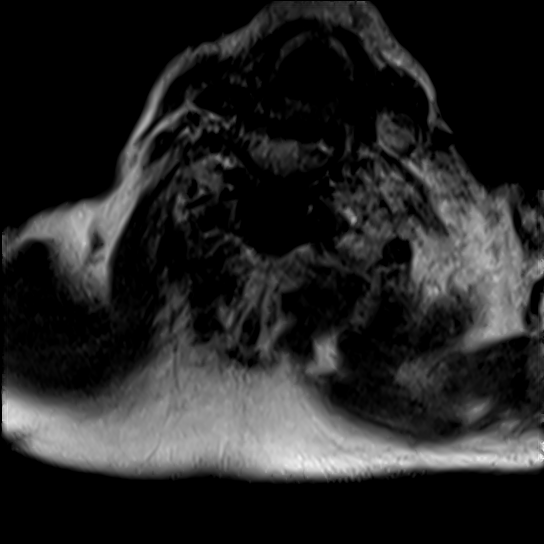

[Series 25: T1 fat-sat post-contrast · sagittal · 3.0mm · 0.89mm/px · 1 of 17 slices shown]
[im 1/17]
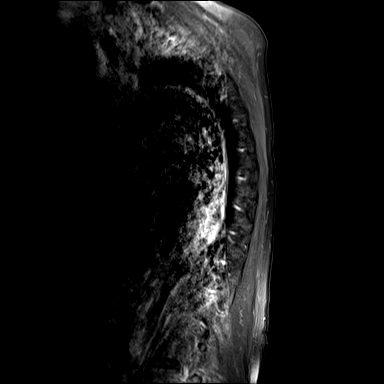

[Series 26: T1 post-contrast · axial · 5.0mm · 0.35mm/px · z∈[-316,-41]mm · 3 of 38 slices shown]
[im 1/38]
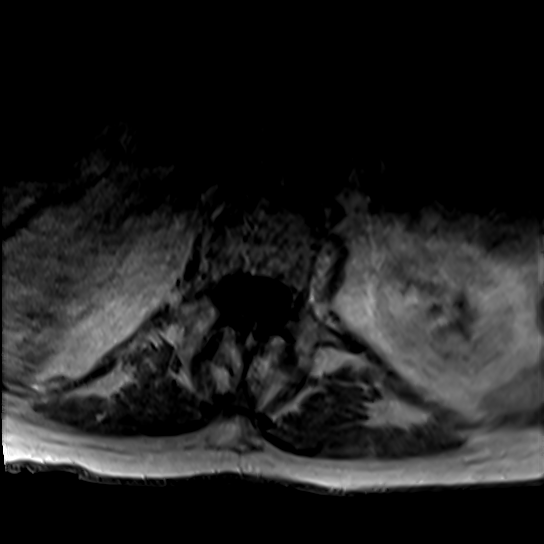
[im 19/38]
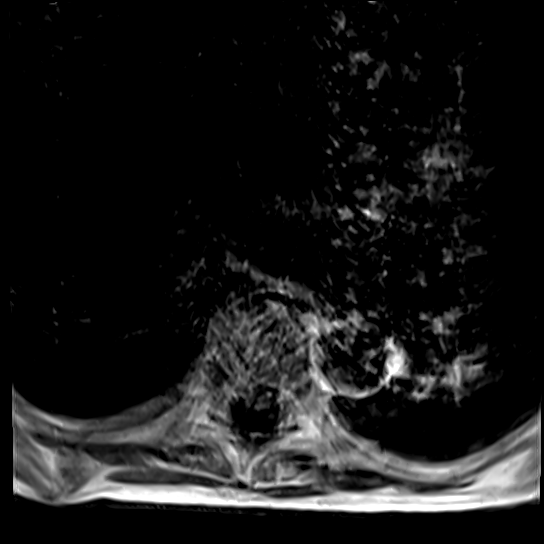
[im 38/38]
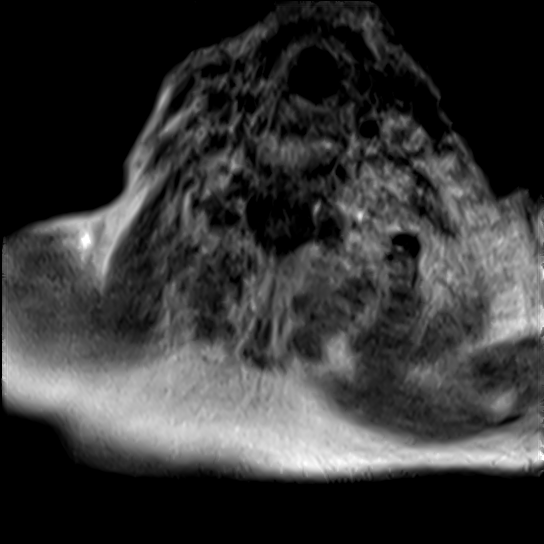

[17 of 48 positions shown; findings below may reference images not displayed]

FINDINGS: MRI CERVICAL SPINE FINDINGS

Alignment: Physiologic.

Vertebrae: No fracture, evidence of discitis, or bone lesion.

Cord: Normal signal and morphology.

Posterior Fossa, vertebral arteries, paraspinal tissues: Negative.

Disc levels:

Axial images are severely motion degraded.

C1-2: Unremarkable.

C2-3: Normal disc space and facet joints. There is no spinal canal
stenosis. No neural foraminal stenosis.

C3-4: Normal disc space and facet joints. There is no spinal canal
stenosis. No neural foraminal stenosis.

C4-5: Normal disc space and facet joints. There is no spinal canal
stenosis. No neural foraminal stenosis.

C5-6: Small left asymmetric disc bulge. There is no spinal canal
stenosis. Mild left neural foraminal stenosis.

C6-7: Left asymmetric disc bulge with uncovertebral spurring. There
is no spinal canal stenosis. Moderate left neural foraminal
stenosis.

C7-T1: Normal disc space and facet joints. There is no spinal canal
stenosis. No neural foraminal stenosis.

MRI THORACIC SPINE FINDINGS

Alignment:  Physiologic.

Vertebrae: No fracture, evidence of discitis, or bone lesion.

Cord:  Normal signal and morphology.

Paraspinal and other soft tissues: Negative.

Disc levels:

Severely motion degraded axial images. No spinal canal stenosis.
There is a small central disc protrusion noted at T7-8.

No abnormal contrast enhancement.
IMPRESSION: 1. Motion degraded study.
2. No acute abnormality of the cervical or thoracic spine.
3. Moderate left C6-7 and mild left C5-6 neural foraminal stenosis.
4. No spinal canal stenosis.

## 2020-07-22 IMAGING — MR MR CERVICAL SPINE WO/W CM
6 of 8 series · 31 of 48 positions shown · IV contrast (5ML GADAVIST)
Comparison: None.

CLINICAL DATA: Hyper-reflexia and abnormal gait

EXAM:
MRI CERVICAL AND THORACIC SPINE WITHOUT AND WITH CONTRAST
TECHNIQUE: Multiplanar and multiecho pulse sequences of the cervical spine, to
include the craniocervical junction and cervicothoracic junction,
and the thoracic spine, were obtained without and with intravenous
contrast.
CONTRAST:  5mL GADAVIST GADOBUTROL 1 MMOL/ML IV SOLN

[Series 1: T2 · sagittal · 3.0mm · 0.69mm/px · 4 of 15 slices shown (1 of 2)]
[im 1/15]
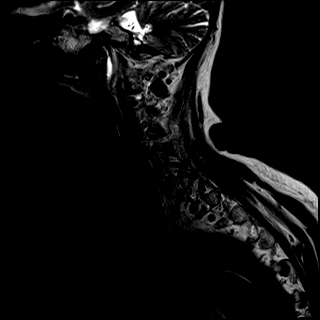
[im 5/15]
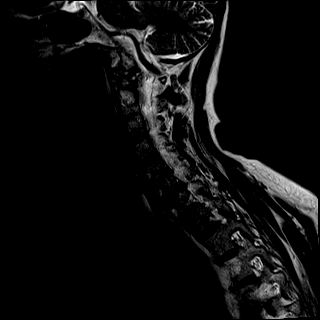
[im 10/15]
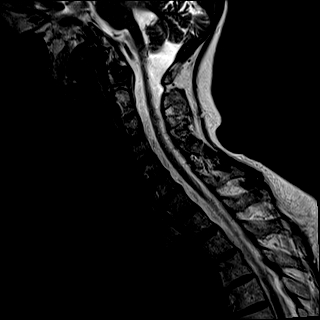
[im 15/15]
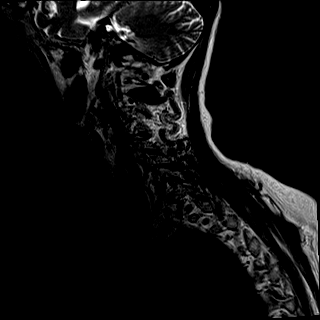

[Series 2: T1 · sagittal · 3.0mm · 0.69mm/px · 4 of 15 slices shown (1 of 2)]
[im 1/15]
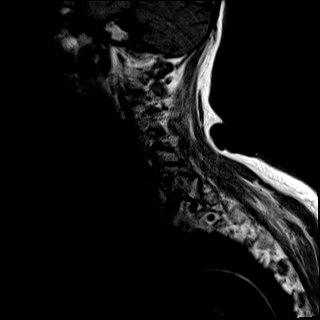
[im 5/15]
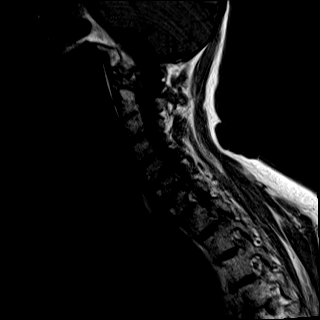
[im 10/15]
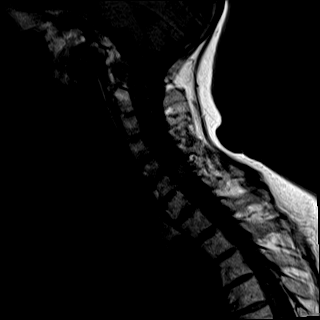
[im 15/15]
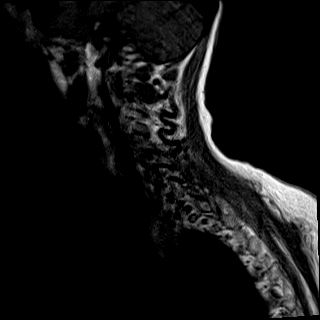

[Series 3: STIR · sagittal · 3.0mm · 0.86mm/px · 4 of 15 slices shown]
[im 1/15]
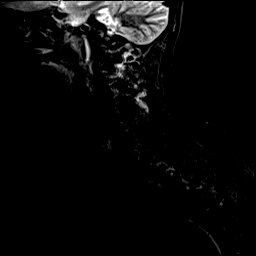
[im 5/15]
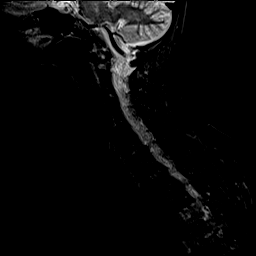
[im 10/15]
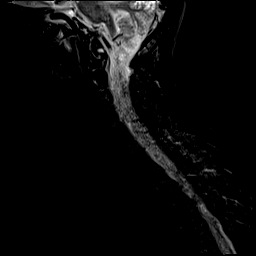
[im 15/15]
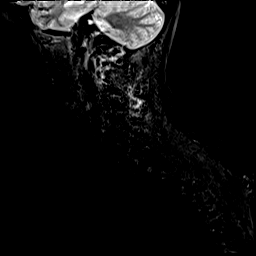

[Series 4: T2 · axial · 3.0mm · 0.66mm/px · z∈[-113,-30]mm · 8 of 30 slices shown (2 of 2)]
[im 1/30]
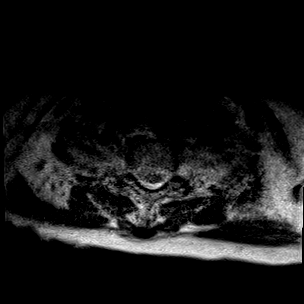
[im 5/30]
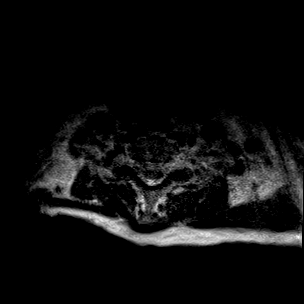
[im 9/30]
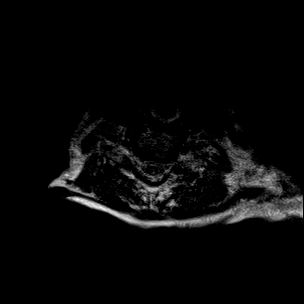
[im 13/30]
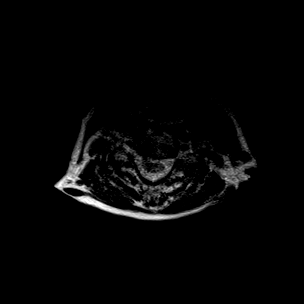
[im 17/30]
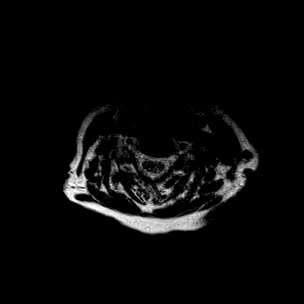
[im 21/30]
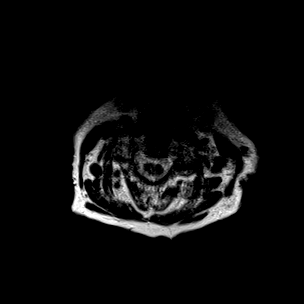
[im 25/30]
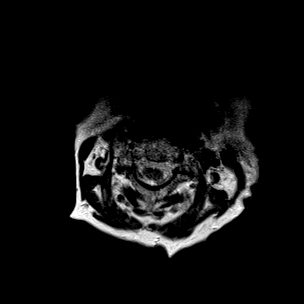
[im 30/30]
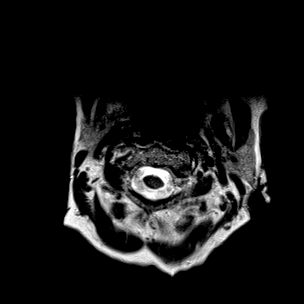

[Series 6: T1 · axial · 3.0mm · 0.35mm/px · z∈[-109,-26]mm · 8 of 30 slices shown (2 of 2)]
[im 1/30]
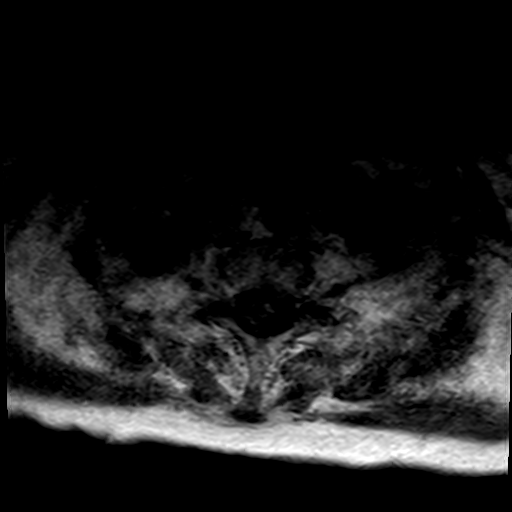
[im 5/30]
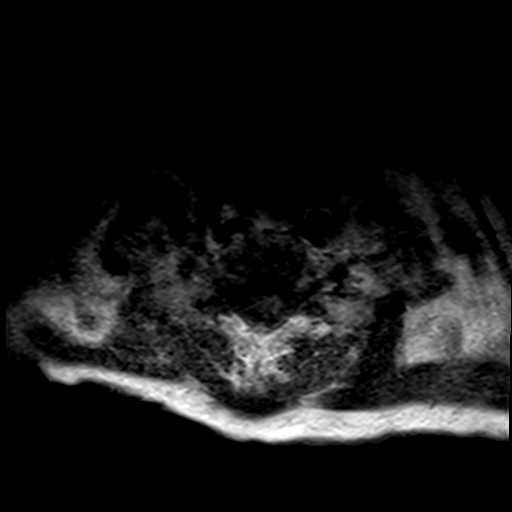
[im 9/30]
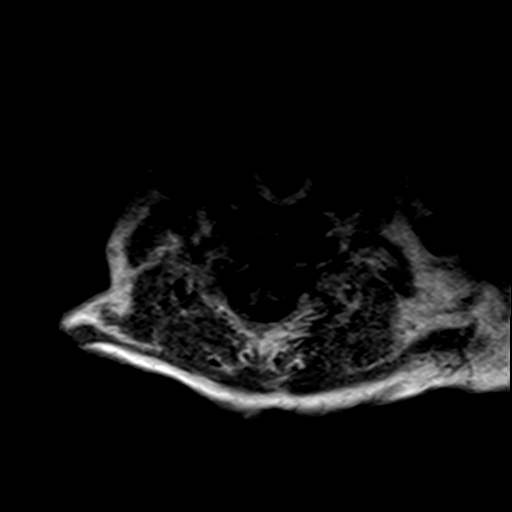
[im 13/30]
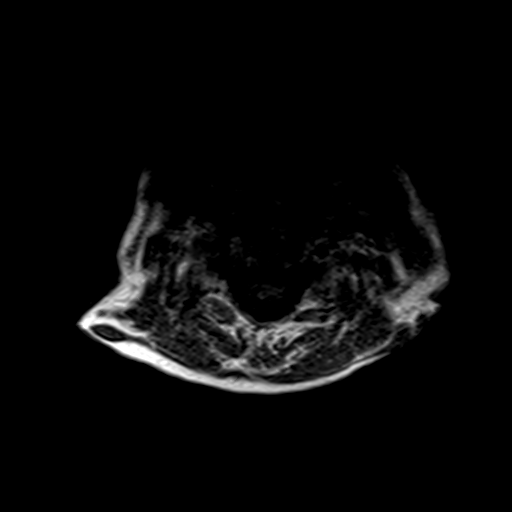
[im 17/30]
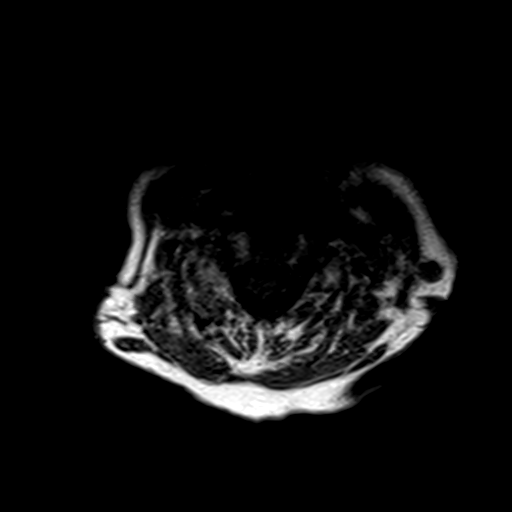
[im 21/30]
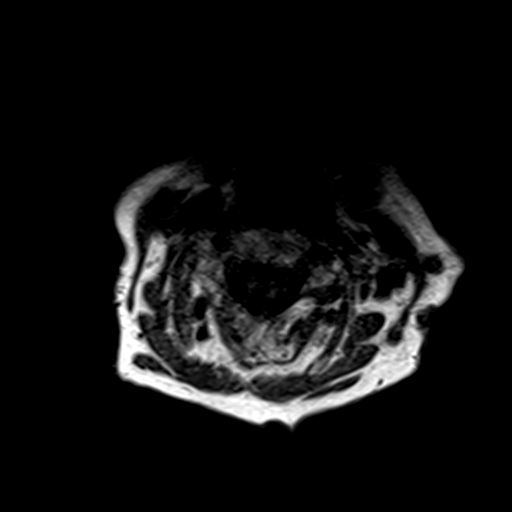
[im 25/30]
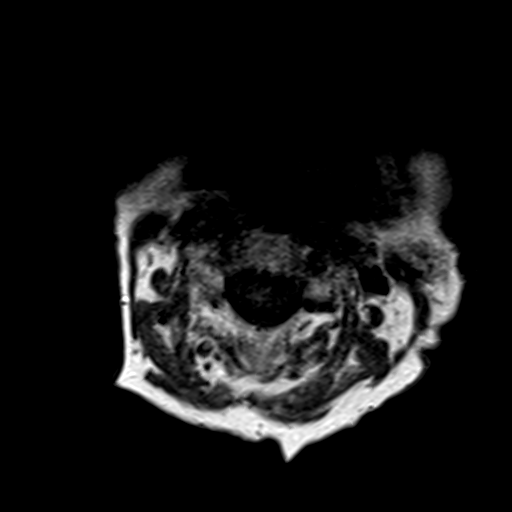
[im 30/30]
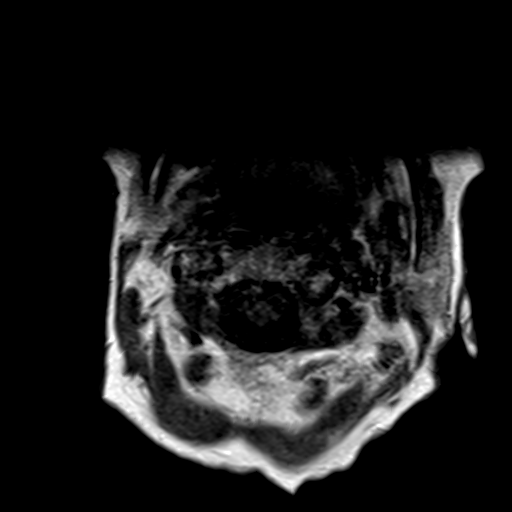

[Series 7: T1 post-contrast · sagittal · 3.0mm · 0.43mm/px · 3 of 15 slices shown]
[im 1/15]
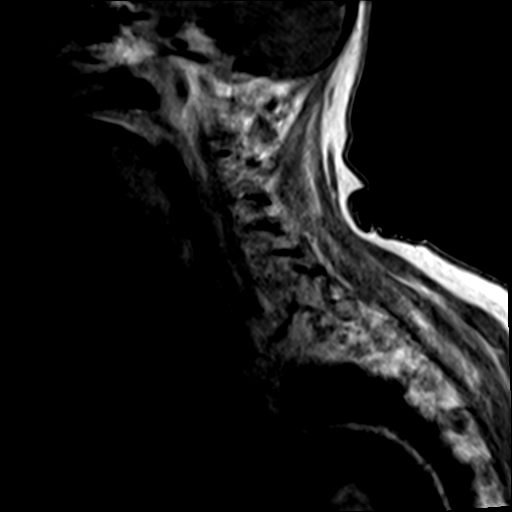
[im 5/15]
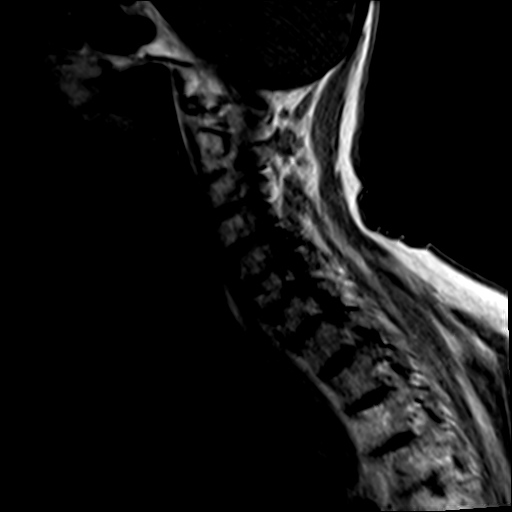
[im 10/15]
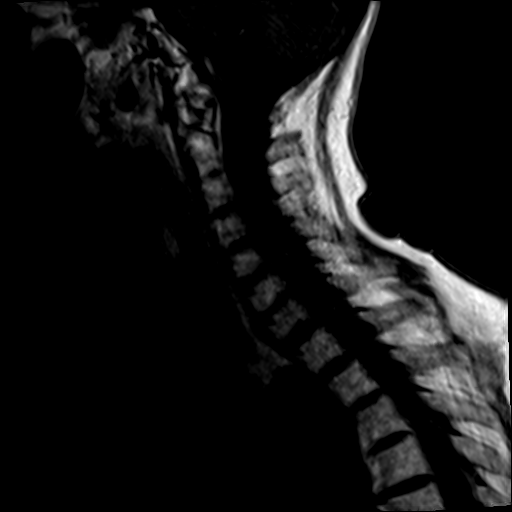

[31 of 48 positions shown; findings below may reference images not displayed]

FINDINGS: MRI CERVICAL SPINE FINDINGS

Alignment: Physiologic.

Vertebrae: No fracture, evidence of discitis, or bone lesion.

Cord: Normal signal and morphology.

Posterior Fossa, vertebral arteries, paraspinal tissues: Negative.

Disc levels:

Axial images are severely motion degraded.

C1-2: Unremarkable.

C2-3: Normal disc space and facet joints. There is no spinal canal
stenosis. No neural foraminal stenosis.

C3-4: Normal disc space and facet joints. There is no spinal canal
stenosis. No neural foraminal stenosis.

C4-5: Normal disc space and facet joints. There is no spinal canal
stenosis. No neural foraminal stenosis.

C5-6: Small left asymmetric disc bulge. There is no spinal canal
stenosis. Mild left neural foraminal stenosis.

C6-7: Left asymmetric disc bulge with uncovertebral spurring. There
is no spinal canal stenosis. Moderate left neural foraminal
stenosis.

C7-T1: Normal disc space and facet joints. There is no spinal canal
stenosis. No neural foraminal stenosis.

MRI THORACIC SPINE FINDINGS

Alignment:  Physiologic.

Vertebrae: No fracture, evidence of discitis, or bone lesion.

Cord:  Normal signal and morphology.

Paraspinal and other soft tissues: Negative.

Disc levels:

Severely motion degraded axial images. No spinal canal stenosis.
There is a small central disc protrusion noted at T7-8.

No abnormal contrast enhancement.
IMPRESSION: 1. Motion degraded study.
2. No acute abnormality of the cervical or thoracic spine.
3. Moderate left C6-7 and mild left C5-6 neural foraminal stenosis.
4. No spinal canal stenosis.

## 2020-07-22 MED ORDER — LORAZEPAM 2 MG/ML IJ SOLN
1.0000 mg | Freq: Once | INTRAMUSCULAR | Status: AC | PRN
  Administered 2020-07-22: 1 mg via INTRAVENOUS
  Filled 2020-07-22: qty 1

## 2020-07-22 MED ORDER — GADOBUTROL 1 MMOL/ML IV SOLN
5.0000 mL | Freq: Once | INTRAVENOUS | Status: AC | PRN
  Administered 2020-07-22: 5 mL via INTRAVENOUS

## 2020-07-22 MED ORDER — DEXTROSE-NACL 5-0.9 % IV SOLN: INTRAVENOUS | Status: AC

## 2020-07-22 MED ORDER — FLUOXETINE HCL 20 MG PO CAPS: 20.0000 mg | ORAL_CAPSULE | Freq: Every day | ORAL | Status: DC

## 2020-07-22 MED ORDER — HYDROXYZINE HCL 25 MG PO TABS
25.0000 mg | ORAL_TABLET | Freq: Three times a day (TID) | ORAL | Status: DC | PRN
  Filled 2020-07-22: qty 1

## 2020-07-22 NOTE — Progress Notes (Signed)
Patient arrived in the unit from [facility], A&O x2-3, denied of any distress, denied of pain, initiated telemetry monitor, all belongings are within reach, and will continues to monitor .

## 2020-07-22 NOTE — Progress Notes (Signed)
Inpatient Rehab Admissions Coordinator:  Saw pt and daughter, Marisue Ivan at bedside (pt was transferred to [facility] on 7/17). Reviewed CIR goals and expectations with pt and daughter. Both acknowledged understanding. Both continue to be interested in pursuing CIR. Will continue to follow pt's medical workup and progress with therapies.   Wolfgang Phoenix, MS, CCC-SLP Admissions Coordinator [phone]

## 2020-07-22 NOTE — Progress Notes (Signed)
Physical Therapy Treatment Patient Details Name: Sheila Andrade MRN: [id number] DOB: [birth year] Today's Date: 07/22/2020    History of Present Illness 85 y.o. female adm with recurrent UTI PMH:  recent COVID, glaucoma osteoporosis, HLD, depression . Pysch consulted    PT Comments    Pt seen by PT/OT today to increase pt's safety and confidence in her care as she is very fearful of falling. Despite this, she attempted all that we asked of her even when she was fearful. Pt required max A to roll and come to EOB as well as scoot fwd to EOB. Max A +2 needed to stand from bed but pt able to stand from recliner with mod A +2. Use of mirror above sink was helpful in getting pt to stand taller with less posterior lean. Pt reports that she feels she is falling fwd when brought to midline. Pt's daughter present for session. PT will continue to follow.    Follow Up Recommendations  CIR     Equipment Recommendations  None recommended by PT    Recommendations for Other Services Rehab consult     Precautions / Restrictions Precautions Precautions: Fall Restrictions Weight Bearing Restrictions: No    Mobility  Bed Mobility Overal bed mobility: Needs Assistance Bed Mobility: Supine to Sit;Rolling;Sidelying to Sit Rolling: Max assist Sidelying to sit: Max assist Supine to sit: Min guard     General bed mobility comments: Max assist for sequencing and to initiate reaching for bed rail. pt limited by Lt UE pain due to [facility] injury. Pt required assist to bring Bil LE's off EOB and raise trunk. Pt was able to sit straight up in bed to long sitting with min-guard A but unable to pivot to EOB from this position    Transfers Overall transfer level: Needs assistance Equipment used: Rolling walker (2 wheeled) Transfers: Sit to/from [employer] Sit to Stand: +2 physical assistance;+2 safety/equipment;Mod assist;Max assist;From elevated surface Stand pivot transfers: +2 physical  assistance;+2 safety/equipment;Max assist       General transfer comment: Max A +2 for SPT using RW. Mod A +2 for sit to stand at sink from recliner. Pt with greater ability to achieve upright position to correct posterior lean when standing at sink in front of mirror.  Ambulation/Gait         Gait velocity: decr Gait velocity interpretation: <1.31 ft/sec, indicative of household ambulator General Gait Details: pt had great difficulty stepping feet with pivot to chair. very fearful   Stairs             Wheelchair Mobility    Modified Rankin (Stroke Patients Only)       Balance Overall balance assessment: Needs assistance Sitting-balance support: Feet supported Sitting balance-Leahy Scale: Fair Sitting balance - Comments: sitting EOB with min Guard, posterior lean Postural control: Posterior lean Standing balance support: Bilateral upper extremity supported Standing balance-Leahy Scale: Zero Standing balance comment: reliant on UEs and max A x2                            Cognition Arousal/Alertness: Awake/alert Behavior During Therapy: WFL for tasks assessed/performed Overall Cognitive Status: Impaired/Different from baseline Area of Impairment: Following commands;Safety/judgement;Problem solving;Awareness;Attention;Memory                   Current Attention Level: Sustained Memory: Decreased short-term memory Following Commands: Follows one step commands with increased time;Follows one step commands consistently Safety/Judgement: Decreased awareness of safety;Decreased awareness  of deficits Awareness: Emergent Problem Solving: Requires verbal cues;Slow processing General Comments: Pt with fear of falling throughout session during bed mobility and sit<>stand transfers, asking daughter to come help. Requiring verbal and tactile cues for safe transfers and walker placement with stand-pivot transfer. Pt reporting at end of session, that her fear  speaks louder than therapists reassurance at times. Pt requiring increased time for sequencing oral care task.      Exercises Other Exercises Other Exercises: 3x sit<>stands with reaching bil UE's anteriorly on chair prior to rise to facilitate anterior weight shift.    General Comments General comments (skin integrity, edema, etc.): daughter present throughout session      Pertinent Vitals/Pain Pain Assessment: Faces Faces Pain Scale: Hurts a little bit Pain Location: L Shoulder Pain Descriptors / Indicators: Grimacing Pain Intervention(s): Limited activity within patient's tolerance;Monitored during session    Home Living                      Prior Function            PT Goals (current goals can now be found in the care plan section) Acute Rehab PT Goals Patient Stated Goal: participate in therapy PT Goal Formulation: With patient Time For Goal Achievement: 07/29/20 Potential to Achieve Goals: Fair Progress towards PT goals: Progressing toward goals    Frequency    Min 3X/week      PT Plan Current plan remains appropriate    Co-evaluation PT/OT/SLP Co-Evaluation/Treatment: Yes Reason for Co-Treatment: Necessary to address cognition/behavior during functional activity;Complexity of the patient's impairments (multi-system involvement);For patient/therapist safety PT goals addressed during session: Mobility/safety with mobility;Balance OT goals addressed during session: ADL's and self-care      AM-PAC PT "6 Clicks" Mobility   Outcome Measure  Help needed turning from your back to your side while in a flat bed without using bedrails?: A Little Help needed moving from lying on your back to sitting on the side of a flat bed without using bedrails?: A Lot Help needed moving to and from a bed to a chair (including a wheelchair)?: Total Help needed standing up from a chair using your arms (e.g., wheelchair or bedside chair)?: Total Help needed to walk in  hospital room?: Total Help needed climbing 3-5 steps with a railing? : Total 6 Click Score: 9    End of Session Equipment Utilized During Treatment: Gait belt Activity Tolerance: Patient tolerated treatment well Patient left: with call bell/phone within reach;in chair;with family/visitor present Nurse Communication: Mobility status PT Visit Diagnosis: Other abnormalities of gait and mobility (R26.89);Difficulty in walking, not elsewhere classified (R26.2)     Time: 5170-0174 PT Time Calculation (min) (ACUTE ONLY): 36 min  Charges:  $Therapeutic Activity: 8-22 mins                     Lyanne Co, PT  Acute Rehab Services  Pager [phone] Office [phone]    Lawana Chambers Ailis Rigaud 07/22/2020, 1:45 PM

## 2020-07-22 NOTE — Plan of Care (Signed)

## 2020-07-22 NOTE — Consult Note (Addendum)
[facility] Face-to-Face Psychiatry Consult   Reason for Consult:  "severe depression/intermittent suicidal ideation" Referring Physician:  Glade Lloyd Patient Identification: Moyinoluwa Dawe MRN:  [id number] Principal Diagnosis: MDD (major depressive disorder), recurrent episode, severe (HCC) Diagnosis:  Principal Problem:   MDD (major depressive disorder), recurrent episode, severe (HCC) Active Problems:   Weight loss   Anemia   UTI (urinary tract infection)   Hyponatremia   Suicidal ideations   Dehydration   Total Time spent with patient: 20 minutes  Subjective:   Aariyana Manz is a 85 y.o. female patient admitted due to confusion and inability to stand as well as weakness and loss of balance.  HPI:  Pt is a 85 yo female w/ depression secondary to medical problems. Pt seen at bedside. Pt stated she has been in better mood because there is a "ray of hope" that she will be able to be more mobile with a procedure that can be done at a later point in time. Pt also wanted to correct that she has never had thoughts of hurting herself but she had "wished she could disappear". Pt denies present SI/HI/AVH.   Pt daughter at bedside stated patient "has perked up" in past 2 days stating pt has been in better mood and less depressed. Per daughter, pt appears to have some confusion in morning and evening stating that pt thought she was in a hotel this morning.   Pt primary concern is that she has to undergo another MRI and that the machine gives her anxiety. Pt requested medication to help with anxiety while she has MRI performed.   Past Psychiatric History: MDD  Risk to Self:   No Risk to Others:   No Prior Inpatient Therapy:   None Prior Outpatient Therapy:   yes  Past Medical History:  Past Medical History:  Diagnosis Date   Hot flashes 06/06/2020   Hypertension    History reviewed. No pertinent surgical history. Family History:  Family History  Problem Relation Age of Onset    Hypertension Other    Family Psychiatric  History: n/a Social History:  Social History   Substance and Sexual Activity  Alcohol Use None     Social History   Substance and Sexual Activity  Drug Use Never    Social History   Socioeconomic History   Marital status: Widowed    Spouse name: Not on file   Number of children: Not on file   Years of education: Not on file   Highest education level: Not on file  Occupational History   Not on file  Tobacco Use   Smoking status: Never   Smokeless tobacco: Never  Substance and Sexual Activity   Alcohol use: Not on file   Drug use: Never   Sexual activity: Not on file  Other Topics Concern   Not on file  Social History Narrative   Not on file   Social Determinants of Health   Financial Resource Strain: Not on file  Food Insecurity: Not on file  Transportation Needs: Not on file  Physical Activity: Not on file  Stress: Not on file  Social Connections: Not on file   Additional Social History:    Allergies:  No Known Allergies  Labs:  Results for orders placed or performed during the hospital encounter of 07/14/20 (from the past 48 hour(s))  Urinalysis, Routine w reflex microscopic Urine, Clean Catch     Status: Abnormal   Collection Time: 07/20/20  5:59 PM  Result Value Ref Range  Color, Urine YELLOW YELLOW   APPearance HAZY (A) CLEAR   Specific Gravity, Urine 1.013 1.005 - 1.030   pH 7.0 5.0 - 8.0   Glucose, UA NEGATIVE NEGATIVE mg/dL   Hgb urine dipstick NEGATIVE NEGATIVE   Bilirubin Urine NEGATIVE NEGATIVE   Ketones, ur NEGATIVE NEGATIVE mg/dL   Protein, ur NEGATIVE NEGATIVE mg/dL   Nitrite NEGATIVE NEGATIVE   Leukocytes,Ua NEGATIVE NEGATIVE    Comment: Performed at [facility], [address] [place], [place] [address]  CBC with Differential/Platelet     Status: None   Collection Time: 07/21/20  5:44 AM  Result Value Ref Range   WBC 6.0 4.0 - 10.5 K/uL   RBC 4.48 3.87 - 5.11 MIL/uL    Hemoglobin 12.6 12.0 - 15.0 g/dL   HCT 60.440.1 54.036.0 - 98.146.0 %   MCV 89.5 80.0 - 100.0 fL   MCH 28.1 26.0 - 34.0 pg   MCHC 31.4 30.0 - 36.0 g/dL   RDW 19.114.3 47.811.5 - 29.515.5 %   Platelets 286 150 - 400 K/uL   nRBC 0.0 0.0 - 0.2 %   Neutrophils Relative % 61 %   Neutro Abs 3.7 1.7 - 7.7 K/uL   Lymphocytes Relative 27 %   Lymphs Abs 1.6 0.7 - 4.0 K/uL   Monocytes Relative 9 %   Monocytes Absolute 0.6 0.1 - 1.0 K/uL   Eosinophils Relative 2 %   Eosinophils Absolute 0.1 0.0 - 0.5 K/uL   Basophils Relative 1 %   Basophils Absolute 0.1 0.0 - 0.1 K/uL   Immature Granulocytes 0 %   Abs Immature Granulocytes 0.02 0.00 - 0.07 K/uL    Comment: Performed at [facility], [address] [place], [place] [address]  Comprehensive metabolic panel     Status: Abnormal   Collection Time: 07/21/20  5:44 AM  Result Value Ref Range   Sodium 134 (L) 135 - 145 mmol/L   Potassium 3.8 3.5 - 5.1 mmol/L   Chloride 101 98 - 111 mmol/L   CO2 26 22 - 32 mmol/L   Glucose, Bld 116 (H) 70 - 99 mg/dL    Comment: Glucose reference range applies only to samples taken after fasting for at least 8 hours.   BUN 11 8 - 23 mg/dL   Creatinine, Ser 8.650.67 0.44 - 1.00 mg/dL   Calcium 9.1 8.9 - 78.410.3 mg/dL   Total Protein 6.5 6.5 - 8.1 g/dL   Albumin 3.4 (L) 3.5 - 5.0 g/dL   AST 19 15 - 41 U/L   ALT 13 0 - 44 U/L   Alkaline Phosphatase 60 38 - 126 U/L   Total Bilirubin 0.3 0.3 - 1.2 mg/dL   GFR, Estimated >69>60 >62>60 mL/min    Comment: (NOTE) Calculated using the CKD-EPI Creatinine Equation (2021)    Anion gap 7 5 - 15    Comment: Performed at [facility], [address] [place], [place] [address]  Magnesium     Status: None   Collection Time: 07/21/20  5:44 AM  Result Value Ref Range   Magnesium 2.0 1.7 - 2.4 mg/dL    Comment: Performed at [facility], [address] [place], [place] [address]  CBC with Differential/Platelet     Status: Abnormal   Collection Time:  07/22/20  2:05 AM  Result Value Ref Range   WBC 6.4 4.0 - 10.5 K/uL   RBC 3.64 (L) 3.87 - 5.11 MIL/uL   Hemoglobin 10.3 (L) 12.0 -  15.0 g/dL   HCT 48.5 (L) 46.2 - 70.3 %   MCV 89.0 80.0 - 100.0 fL   MCH 28.3 26.0 - 34.0 pg   MCHC 31.8 30.0 - 36.0 g/dL   RDW 50.0 93.8 - 18.2 %   Platelets 297 150 - 400 K/uL   nRBC 0.0 0.0 - 0.2 %   Neutrophils Relative % 62 %   Neutro Abs 4.0 1.7 - 7.7 K/uL   Lymphocytes Relative 25 %   Lymphs Abs 1.6 0.7 - 4.0 K/uL   Monocytes Relative 10 %   Monocytes Absolute 0.6 0.1 - 1.0 K/uL   Eosinophils Relative 2 %   Eosinophils Absolute 0.1 0.0 - 0.5 K/uL   Basophils Relative 1 %   Basophils Absolute 0.1 0.0 - 0.1 K/uL   Immature Granulocytes 0 %   Abs Immature Granulocytes 0.02 0.00 - 0.07 K/uL    Comment: Performed at [facility], [address], [place], [place] [address]  Comprehensive metabolic panel     Status: Abnormal   Collection Time: 07/22/20  2:05 AM  Result Value Ref Range   Sodium 135 135 - 145 mmol/L   Potassium 3.6 3.5 - 5.1 mmol/L   Chloride 107 98 - 111 mmol/L   CO2 22 22 - 32 mmol/L   Glucose, Bld 146 (H) 70 - 99 mg/dL    Comment: Glucose reference range applies only to samples taken after fasting for at least 8 hours.   BUN 19 8 - 23 mg/dL   Creatinine, Ser 6.96 0.44 - 1.00 mg/dL   Calcium 8.9 8.9 - 78.9 mg/dL   Total Protein 5.0 (L) 6.5 - 8.1 g/dL   Albumin 2.8 (L) 3.5 - 5.0 g/dL   AST 13 (L) 15 - 41 U/L   ALT 12 0 - 44 U/L   Alkaline Phosphatase 50 38 - 126 U/L   Total Bilirubin 0.3 0.3 - 1.2 mg/dL   GFR, Estimated >38 >10 mL/min    Comment: (NOTE) Calculated using the CKD-EPI Creatinine Equation (2021)    Anion gap 6 5 - 15    Comment: Performed at [facility], [address] [place], [place] [address]  Magnesium     Status: None   Collection Time: 07/22/20  2:05 AM  Result Value Ref Range   Magnesium 1.9 1.7 - 2.4 mg/dL    Comment: Performed at [facility], [address] [place], [place]  [address]  TSH     Status: None   Collection Time: 07/22/20 11:27 AM  Result Value Ref Range   TSH 1.380 0.350 - 4.500 uIU/mL    Comment: Performed by a 3rd Generation assay with a functional sensitivity of <=0.01 uIU/mL. Performed at [facility], [address], [place], [place] [address]     Current Facility-Administered Medications  Medication Dose Route Frequency Provider Last Rate Last Admin   acetaminophen (TYLENOL) tablet 650 mg  650 mg Oral Q6H PRN Therisa Doyne, MD   650 mg at 07/18/20 1112   Or   acetaminophen (TYLENOL) suppository 650 mg  650 mg Rectal Q6H PRN Doutova, Anastassia, MD       brinzolamide (AZOPT) 1 % ophthalmic suspension 1 drop  1 drop Left Eye BID Therisa Doyne, MD   1 drop at 07/21/20 2105   cyanocobalamin ((VITAMIN B-12)) injection 1,000 mcg  1,000 mcg Intramuscular Q0600 Glade Lloyd, MD   1,000 mcg at 07/22/20 0611   dextrose 5 %-0.9 % sodium  chloride infusion   Intravenous Continuous Leroy Sea, MD 75 mL/hr at 07/22/20 1354 New Bag at 07/22/20 1354   enoxaparin (LOVENOX) injection 40 mg  40 mg Subcutaneous Q24H Alekh, Kshitiz, MD   40 mg at 07/22/20 1112   feeding supplement (ENSURE ENLIVE / ENSURE PLUS) liquid 237 mL  237 mL Oral BID BM Doutova, Anastassia, MD   237 mL at 07/22/20 1059   FLUoxetine (PROZAC) capsule 10 mg  10 mg Oral Daily Akintayo, Mojeed, MD   10 mg at 07/22/20 1059   [START ON 08/04/2020] FLUoxetine (PROZAC) capsule 20 mg  20 mg Oral Daily Akintayo, Mojeed, MD       hydrOXYzine (ATARAX/VISTARIL) tablet 25 mg  25 mg Oral TID PRN Eliseo Gum B, MD       latanoprost (XALATAN) 0.005 % ophthalmic solution 1 drop  1 drop Left Eye QHS Doutova, Anastassia, MD   1 drop at 07/21/20 2105   LORazepam (ATIVAN) injection 1 mg  1 mg Intravenous Once PRN Leroy Sea, MD       megestrol (MEGACE) 400 MG/10ML suspension 400 mg  400 mg Oral Daily Hanley Ben, Kshitiz, MD   400 mg at 07/22/20 1059   multivitamin with minerals tablet 1  tablet  1 tablet Oral Daily Glade Lloyd, MD   1 tablet at 07/22/20 1059   polyethylene glycol (MIRALAX / GLYCOLAX) packet 17 g  17 g Oral Daily PRN Calvert Cantor, MD   17 g at 07/19/20 1058   simvastatin (ZOCOR) tablet 20 mg  20 mg Oral Daily Doutova, Anastassia, MD   20 mg at 07/22/20 1059   traZODone (DESYREL) tablet 25 mg  25 mg Oral QHS Doutova, Anastassia, MD   25 mg at 07/21/20 2104   [START ON 07/28/2020] venlafaxine XR (EFFEXOR-XR) 24 hr capsule 37.5 mg  37.5 mg Oral Q breakfast Akintayo, Mojeed, MD       venlafaxine XR (EFFEXOR-XR) 24 hr capsule 75 mg  75 mg Oral Q breakfast Jannifer Franklin, Mojeed, MD   75 mg at 07/22/20 0808     Psychiatric Specialty Exam:  Presentation  General Appearance: Appropriate for Environment; Casual  Eye Contact:Minimal  Speech:Slow; Clear and Coherent  Speech Volume:Decreased   Mood and Affect  Mood:Depressed; Dysphoric  Affect:Depressed; Flat   Thought Process  Thought Processes:Coherent; Linear; Goal Directed  Descriptions of Associations:Intact  Orientation:Full (Time, Place and Person)  Thought Content:Logical  History of Schizophrenia/Schizoaffective disorder:No data recorded Duration of Psychotic Symptoms:No data recorded Hallucinations:No data recorded Ideas of Reference:None  Suicidal Thoughts:No data recorded Homicidal Thoughts:No data recorded  Sensorium  Memory:Recent Good; Immediate Good; Remote Good  Judgment:Fair  Insight:Fair   Executive Functions  Concentration:Fair  Attention Span:Good  Recall:Good  Fund of Knowledge:Good  Language:Good   Psychomotor Activity  Psychomotor Activity: No data recorded  Assets  Assets:Desire for Improvement; Manufacturing systems engineer; Housing; Social Support; Resilience; Physical Health; Leisure Time; Vocational/Educational; Talents/Skills; Transportation; Financial Resources/Insurance   Sleep  Sleep: No data recorded  Physical Exam: Physical Exam Vitals reviewed.   Constitutional:      Appearance: Normal appearance.  HENT:     Head: Normocephalic and atraumatic.  Eyes:     Extraocular Movements: Extraocular movements intact.  Cardiovascular:     Rate and Rhythm: Normal rate and regular rhythm.  Pulmonary:     Effort: Pulmonary effort is normal.     Breath sounds: Normal breath sounds.  Abdominal:     General: Abdomen is flat.     Palpations: Abdomen is  soft.  Musculoskeletal:     Cervical back: Normal range of motion.  Skin:    General: Skin is warm and dry.  Neurological:     General: No focal deficit present.     Mental Status: She is alert and oriented to person, place, and time.   Review of Systems  Constitutional:  Negative for chills and fever.  Respiratory:  Negative for cough, shortness of breath and wheezing.   Cardiovascular:  Negative for chest pain and palpitations.  Gastrointestinal:  Negative for abdominal pain, nausea and vomiting.  Skin:  Negative for itching and rash.  Neurological:  Negative for dizziness and headaches.  Blood pressure 120/65, pulse 87, temperature 98.2 F (36.8 C), temperature source Oral, resp. rate 19, height 5\' 7"  (1.702 m), weight 51.3 kg, SpO2 97 %. Body mass index is 17.7 kg/m.  Treatment Plan Summary: Pt is a 85 yo female w/ depression secondary to medical problems. Pt doing well today and states she is feeling better since there is a "ray of hope" for her to have improved mobility. Pt has some anxiety regarding undergoing MRI but agreeable to it as long as she is able to have a medication to calm her down. Plan to continue with Dr. 97 plan to wean patient off effexor and titrate up Prozac to 20 mg qd.   Depression related to medical problems vs MDD -Continue Effexor XR 75 mg qd until 07/28/20 then 37.5 mg qd until 08/04/20 then stop. -Continue Prozac 10 mg until 07/28/20 then increase to Prozac 20 mg daily for depression.  -Continue Megace 400 mg daily for appetite stimulation -Continue  Trazodone 25 mg at bedtime for sleep -Recommend outpatient psychiatry referral for med management upon discharge -Recommend TOC consult -Start Atarax/Vistaril 25 mg tid prn for anxiety -will continue to follow  Disposition: No evidence of imminent risk to self or others at present.   Patient does not meet criteria for psychiatric inpatient admission. Supportive therapy provided about ongoing stressors.  Thank you for the consult 07/30/20, MD PGY1 Psychiatry Resident 07/22/2020 2:18 PM

## 2020-07-22 NOTE — Progress Notes (Signed)
PROGRESS NOTE                                                                                                                                                                                                             Patient Demographics:    Sheila Andrade, is a 85 y.o. female, DOB - [birth year], WPV:[id number]  Outpatient Primary MD for the patient is Dalphine Handing, MD    LOS - 6  Admit date - 07/14/2020    Chief Complaint  Patient presents with   Failure To Thrive   Fatigue       Brief Narrative (HPI from H&P)  - 85 year old female with history of depression, ongoing functional decline, fall with history of pelvic fracture, recent development of tremors and bradykinesia who was brought in for suicidal ideation, decreased oral intake and ongoing generalized deconditioning, was seen by psych and neurology also as she had ongoing functional decline with bradykinesia.  She was admitted to [facility] long hospital thereafter transferred to [facility] on 07/22/2020 for continued neurology follow-up.   Subjective:    Sheila Andrade today has, No headache, No chest pain, No abdominal pain - No Nausea, No new weakness tingling or numbness, no shortness of breath, overall feels better, appetite has improved.   Assessment  & Plan :     Severe depression and suicidal ideation.  Seen by psych psych is following.  Currently not actively suicidal or homicidal, still mildly anxious, on Effexor and trazodone and will defer all psych issues to psychiatry directly, have requested them to follow on a daily basis.  2.  Failure to thrive and ongoing functional decline with decreased oral intake.  Supportive care, psych treatment as above.  Continue oral protein supplementation, oral intake has improved, gentle IV fluids for another 24 hours, may require placement.  3.  Poor balance, bradykinesia, hyperreflexia in both lower extremities.  MRI brain  unremarkable, neurology is following, this discussed with neurologist on 07/22/2020 who just talk to patient's daughter, family deciding on MRI C and L-spine, decision to be made on 07/22/2020.  4.  Vitamin B12 deficiency.  Placed on replacement.  5.  Mild hyponatremia.  Improving with IV fluids.  6.  Moderate to severe protein calorie malnutrition.  On supplements.  7.  Abnormal UA.  UTI ruled out clinically.  Cultures.  Condition -fair  Family Communication  : Message left for patient's daughter DC through patient's phone on 07/22/2020  Code Status : Full  Consults  : Psychiatry, neurology  PUD Prophylaxis : None   Procedures  :     MRI brain.  Nonacute.      Disposition Plan  :    Status is: Inpatient  Remains inpatient appropriate because:IV treatments appropriate due to intensity of illness or inability to take PO  Dispo: The patient is from: Home              Anticipated d/c is to: SNF              Patient currently is not medically stable to d/c.   Difficult to place patient No  DVT Prophylaxis  :    enoxaparin (LOVENOX) injection 40 mg Start: 07/21/20 1200 SCDs Start: 07/15/20 0036   Lab Results  Component Value Date   PLT 297 07/22/2020    Diet :  Diet Order             Diet regular Room service appropriate? Yes; Fluid consistency: Thin  Diet effective now                    Inpatient Medications  Scheduled Meds:  brinzolamide  1 drop Left Eye BID   cyanocobalamin  1,000 mcg Intramuscular Q0600   enoxaparin (LOVENOX) injection  40 mg Subcutaneous Q24H   feeding supplement  237 mL Oral BID BM   FLUoxetine  10 mg Oral Daily   [START ON 08/04/2020] FLUoxetine  20 mg Oral Daily   latanoprost  1 drop Left Eye QHS   megestrol  400 mg Oral Daily   multivitamin with minerals  1 tablet Oral Daily   simvastatin  20 mg Oral Daily   traZODone  25 mg Oral QHS   [START ON 07/28/2020] venlafaxine XR  37.5 mg Oral Q breakfast   venlafaxine XR  75  mg Oral Q breakfast   Continuous Infusions:  dextrose 5 % and 0.9% NaCl 75 mL/hr at 07/21/20 2342   PRN Meds:.acetaminophen **OR** acetaminophen, HYDROcodone-acetaminophen, polyethylene glycol  Antibiotics  :    Anti-infectives (From admission, onward)    Start     Dose/Rate Route Frequency Ordered Stop   07/15/20 2000  cefTRIAXone (ROCEPHIN) 1 g in sodium chloride 0.9 % 100 mL IVPB  Status:  Discontinued        1 g 200 mL/hr over 30 Minutes Intravenous Every 24 hours 07/15/20 0000 07/15/20 1635   07/14/20 2315  cefTRIAXone (ROCEPHIN) 1 g in sodium chloride 0.9 % 100 mL IVPB        1 g 200 mL/hr over 30 Minutes Intravenous  Once 07/14/20 2307 07/15/20 0040        Time Spent in minutes  30   Susa Raring M.D on 07/22/2020 at 11:18 AM  To page go to [email or web address]   [facility] -  Office  [phone]  See all Orders from today for further details    Objective:   Vitals:   07/21/20 2149 07/21/20 2353 07/22/20 0459 07/22/20 0732  BP: (!) 141/79 120/61 121/68 130/70  Pulse: 84 81 78 78  Resp: 20   19  Temp: 97.8 F (36.6 C) 98.6 F (37 C) 98.1 F (36.7 C) 97.8 F (36.6 C)  TempSrc: Oral Oral Oral Oral  SpO2: 96% 99% 97% 100%  Weight:      Height:  Wt Readings from Last 3 Encounters:  07/14/20 51.3 kg  05/31/20 54.4 kg  05/27/20 52.8 kg     Intake/Output Summary (Last 24 hours) at 07/22/2020 1118 Last data filed at 07/21/2020 1811 Gross per 24 hour  Intake 220 ml  Output 700 ml  Net -480 ml     Physical Exam  Awake Alert x 2, bilateral lower extremity strength 4/5 with increased tone and hyperreflexia, anxious affect [place].AT,PERRAL Supple Neck,No JVD, No cervical lymphadenopathy appriciated.  Symmetrical Chest wall movement, Good air movement bilaterally, CTAB RRR,No Gallops,Rubs or new Murmurs, No Parasternal Heave +ve B.Sounds, Abd Soft, No tenderness, No organomegaly appriciated, No rebound - guarding or rigidity. No Cyanosis,  Clubbing or edema, No new Rash or bruise        Data Review:    CBC Recent Labs  Lab 07/18/20 0457 07/21/20 0544 07/22/20 0205  WBC 6.9 6.0 6.4  HGB 12.7 12.6 10.3*  HCT 39.6 40.1 32.4*  PLT 315 286 297  MCV 89.6 89.5 89.0  MCH 28.7 28.1 28.3  MCHC 32.1 31.4 31.8  RDW 14.5 14.3 14.4  LYMPHSABS 1.8 1.6 1.6  MONOABS 0.8 0.6 0.6  EOSABS 0.2 0.1 0.1  BASOSABS 0.1 0.1 0.1    Recent Labs  Lab 07/17/20 0451 07/18/20 0457 07/19/20 0448 07/20/20 0538 07/21/20 0544  NA 131* 131* 132* 133* 134*  K 4.5 4.4 3.8 4.0 3.8  CL 100 100 102 99 101  CO2 24 24 23 27 26   GLUCOSE 85 83 94 94 116*  BUN 16 14 13 16 11   CREATININE 0.53 0.61 0.53 0.66 0.67  CALCIUM 9.2 9.2 8.9 9.4 9.1  AST  --   --   --   --  19  ALT  --   --   --   --  13  ALKPHOS  --   --   --   --  60  BILITOT  --   --   --   --  0.3  ALBUMIN  --   --   --   --  3.4*  MG  --  2.1 2.0 2.1 2.0    ------------------------------------------------------------------------------------------------------------------ No results for input(s): CHOL, HDL, LDLCALC, TRIG, CHOLHDL, LDLDIRECT in the last 72 hours.  No results found for: HGBA1C ------------------------------------------------------------------------------------------------------------------ No results for input(s): TSH, T4TOTAL, T3FREE, THYROIDAB in the last 72 hours.  Invalid input(s): FREET3  Cardiac Enzymes No results for input(s): CKMB, TROPONINI, MYOGLOBIN in the last 168 hours.  Invalid input(s): CK ------------------------------------------------------------------------------------------------------------------ No results found for: BNP   Radiology Reports DG Chest 2 View  Result Date: 07/14/2020 CLINICAL DATA:  Failure to thrive, weight loss EXAM: CHEST - 2 VIEW COMPARISON:  Radiograph 05/31/2020 FINDINGS: Suspect a small calcified mediastinal lymph node seen on lateral radiograph. Remaining cardiomediastinal contours are unremarkable. Bandlike  opacity in the right lung base, likely scarring given similar to prior. No consolidation, features of edema, pneumothorax, or effusion. Degenerative changes are present in the imaged spine and shoulders. Evidence of calcific tendinosis in the left shoulder. Left cervical carotid atherosclerosis. IMPRESSION: Suspect a small calcified mediastinal/hilar node. Can reflect sequela of prior granulomatous disease. No acute cardiopulmonary abnormality. Electronically Signed   By: Kreg ShropshirePrice  DeHay M.D.   On: 07/14/2020 20:56   MR BRAIN WO CONTRAST  Result Date: 07/14/2020 CLINICAL DATA:  Mental status change. EXAM: MRI HEAD WITHOUT CONTRAST TECHNIQUE: Multiplanar, multiecho pulse sequences of the brain and surrounding structures were obtained without intravenous contrast. COMPARISON:  None. FINDINGS: Brain: No  acute infarction, acute hemorrhage, hydrocephalus, extra-axial collection or mass lesion. Moderate scattered T2/FLAIR hyperintensities within the white matter, which are nonspecific but most likely related to chronic microvascular ischemic disease given patient age. Mild-to-moderate atrophy with ex vacuo ventricular dilation. The callosal angle is not acute and there is no crowding of sulci at the vertex. Dilated perivascular space in the inferior left basal ganglia. Small focus of susceptibility artifact in the lateral left cerebellum, most likely a prior microhemorrhage. Vascular: Major arterial flow voids are maintained skull base. Skull and upper cervical spine: Normal marrow signal. Sinuses/Orbits: Visualized sinuses are clear. No acute orbital findings. Other: Small left mastoid effusion. IMPRESSION: 1. No evidence of acute intracranial abnormality 2. Moderate chronic microvascular ischemic disease and mild-to-moderate atrophy. 3. Small left mastoid effusion. Electronically Signed   By: Feliberto Harts MD   On: 07/14/2020 20:11   DG Hip Unilat W or Wo Pelvis 2-3 Views Left  Result Date: 07/14/2020 CLINICAL  DATA:  Failure to thrive and weight loss EXAM: DG HIP (WITH OR WITHOUT PELVIS) 2-3V LEFT COMPARISON:  01/01/2020 FINDINGS: Pelvic ring is intact. Old healed pubic rami fractures are noted on the left stable from prior CT examination. Degenerative changes of the hip joints are noted right greater than left. No acute fracture or dislocation in the left hip is noted. Some widening of the left sacroiliac joint is noted which appears more prominent than that seen on prior CT. IMPRESSION: Slight widening of the left sacroiliac joint which may be projectional in nature. Degenerative changes of the hip joints bilaterally right greater than left. No acute fracture is seen. Old fractures in the left pubic rami are seen. Electronically Signed   By: Alcide Clever M.D.   On: 07/14/2020 20:56

## 2020-07-22 NOTE — Progress Notes (Addendum)
Neurology Progress Note  Patient ID: Sheila Andrade is a 85 y.o. with PMHx of  has a past medical history of Hot flashes (06/06/2020) and Hypertension.  Initially consulted for: Parkinsonian features   Major recent events -Abilify discontinued on 7/13 -Psych cross-tapering effexor to prozac  Subjective: - Mood is significantly improved - Denies current SI/HI - Transferred from [facility] to [facility] - No new neurologic complaints  Exam: Vitals:   07/22/20 0459 07/22/20 0732  BP: 121/68 130/70  Pulse: 78 78  Resp:  19  Temp: 98.1 F (36.7 C) 97.8 F (36.6 C)  SpO2: 97% 100%   Gen: In bed, comfortable  Resp: non-labored breathing, no grossly audible wheezing Cardiac: Perfusing extremities well  Abd: soft, nt Psych: affect remains flat, but less masked than on my initial evaluation Extremities: Bilateral feet in extreme plantarflexion with toes dorsiflexed concerning for Charcot feet  Neuro: MS: Aware, alert, oriented  CN: Face symmetric, tongue midline, EOMI with saccadic pursuits Motor: Still has cogwheeling, improved based on documented exams by Dr. Iver Nestle last week.  Patient is not as severely bradykinetic.  Tone remains increased throughout but less so than prior Sensory: Equally reactive to light touch throughout DTR: 3+ and symmetric patellar's and brachioradialis  Pertinent Labs:  Basic Metabolic Panel: Recent Labs  Lab 07/17/20 0451 07/18/20 0457 07/19/20 0448 07/20/20 0538 07/21/20 0544  NA 131* 131* 132* 133* 134*  K 4.5 4.4 3.8 4.0 3.8  CL 100 100 102 99 101  CO2 24 24 23 27 26   GLUCOSE 85 83 94 94 116*  BUN 16 14 13 16 11   CREATININE 0.53 0.61 0.53 0.66 0.67  CALCIUM 9.2 9.2 8.9 9.4 9.1  MG  --  2.1 2.0 2.1 2.0     CBC: Recent Labs  Lab 07/18/20 0457 07/21/20 0544 07/22/20 0205  WBC 6.9 6.0 6.4  NEUTROABS 4.0 3.7 4.0  HGB 12.7 12.6 10.3*  HCT 39.6 40.1 32.4*  MCV 89.6 89.5 89.0  PLT 315 286 297     Coagulation Studies: No results for input(s):  LABPROT, INR in the last 72 hours.   Hyponatremia remains stable and not severe, CBC remained stable and within normal limits  Impression: This is an 85 year old woman suffering significant depression however with marked improvement of parkinsonian features off of Abilify.  Continue to feel she may have an atypical Parkinson's syndrome which is best evaluated by outpatient neurology.  Given the findings of her lower extremities there may be significant neuropathy as well, that would require outpatient EMG nerve conduction study to clarify.  She does appear to be very sensitive to even the mildest D2 receptor blockers, and again I defer management of her mood symptoms to psychiatry given their severity. They are cross-tapering effexor to prozac currently.   Given her hyperreflexia and progressive gait impairment, MRI cervical spine and thoracic spine would be recommended to rule out a compressive process.  This was suggested 2+ days ago but patient is still unsure if she wants to undergo the imaging. I explained it is important to r/o cord compression or other spinal cord process contributing to her BLE weakness. She will let me know tomorrow what she decides.  Recommendations: -Please order MRI cervical and thoracic spine without contrast if family and patient decide to pursue this - Consider sinemet trial if MRIs unrevealing -Outpatient referrals to neurology and neuropsychology placed at the time of my initial consultation  07/24/20, MD [facility] [phone]  If 7pm- 7am, please page neurology  on call as listed in AMION.   Greater than 35 minutes were spent in care of this patient today, including a 20-minute conversation with Ms. Sheila Andrade daughter iscussing her concerns as well as 15 minutes at bedside and discussion with primary team.

## 2020-07-22 NOTE — Progress Notes (Signed)
Occupational Therapy Treatment Patient Details Name: Sheila Andrade MRN: [id number] DOB: 03/16/1935 Today's Date: 07/22/2020    History of present illness 85 y.o. female adm with recurrent UTI PMH:  recent COVID, glaucoma osteoporosis, HLD, depression . Pysch consulted   OT comments  Pt progressing towards established OT goals. Pt seen in collaboration with PT to address functional ADLs and transfers. Pt motivated to participate in therapy. Pt performing oral care seated at sink with Min A to manage toothpaste due to decreased pinch strength. Pt performing sit<>stand transfers with Max A+2 this session. Pt with fear of falling throughout session, requiring multimodal cues to correct standing posture. Pt continues to present with decreased strength, balance, activity tolerance, safety, awareness, and problem solving. Due to pt support, motivation, and significant change in functional status, recommend discharge to CIR for intensive OT services and will continue to follow acutely to optimize independence in ADLs.   Follow Up Recommendations  CIR    Equipment Recommendations  Other (comment) (defer to next venue)    Recommendations for Other Services Rehab consult    Precautions / Restrictions Precautions Precautions: Fall Restrictions Weight Bearing Restrictions: No       Mobility Bed Mobility Overal bed mobility: Needs Assistance Bed Mobility: Supine to Sit;Rolling;Sidelying to Sit Rolling: Max assist Sidelying to sit: Max assist Supine to sit: Min guard     General bed mobility comments: Max assist for sequencing and to initiate reaching for bed rail. pt limited by Lt UE pain due to [facility] injury. Pt required assist to bring Bil LE's off EOB and raise trunk.    Transfers Overall transfer level: Needs assistance Equipment used: Rolling walker (2 wheeled) Transfers: Sit to/from [employer] Sit to Stand: +2 physical assistance;+2 safety/equipment;Mod assist;Max  assist;From elevated surface Stand pivot transfers: +2 physical assistance;+2 safety/equipment;Max assist       General transfer comment: Max A +2 for SPT using RW. Mod A +2 for sit to stand at sink from recliner. Pt with greater ability to achieve upright position to correct posterior lean when standing at sink in front of mirror.    Balance Overall balance assessment: Needs assistance Sitting-balance support: Feet supported Sitting balance-Leahy Scale: Fair Sitting balance - Comments: sitting EOB with min Guard Postural control: Posterior lean Standing balance support: Bilateral upper extremity supported Standing balance-Leahy Scale: Zero Standing balance comment: reliant on UEs and max A x2                           ADL either performed or assessed with clinical judgement   ADL Overall ADL's : Needs assistance/impaired     Grooming: Sitting;Minimal assistance Grooming Details (indicate cue type and reason): Pt requiring increased time. Min A to squeeze toothpaste tube to move toothpaste closer to opening of tube. Pt requiring encouragement, initially stating she cannot brush her teeth. Standing with Max A and reaching to turn off water after performing oral care seated.                 Toilet Transfer: Maximal assistance;+2 for physical assistance;+2 for safety/equipment;Cueing for safety;Cueing for sequencing;Stand-pivot;BSC;RW Toilet Transfer Details (indicate cue type and reason): strong posterior lean, and pt requiring cues for upright position. max x2 with walker to pivot to recliner         Functional mobility during ADLs: Maximal assistance;+2 for physical assistance;+2 for safety/equipment;Cueing for safety;Cueing for sequencing;Rolling walker (stand piviot transfer) General ADL Comments: Pt with decreased dexterity, requiring  min A to manage toothpaste tube.     Vision   Additional Comments: to be further assessed   Perception     Praxis       Cognition Arousal/Alertness: Awake/alert Behavior During Therapy: WFL for tasks assessed/performed Overall Cognitive Status: Impaired/Different from baseline Area of Impairment: Following commands;Safety/judgement;Problem solving;Awareness;Attention                   Current Attention Level: Sustained   Following Commands: Follows one step commands with increased time;Follows one step commands consistently Safety/Judgement: Decreased awareness of safety;Decreased awareness of deficits Awareness: Emergent Problem Solving: Requires verbal cues;Slow processing General Comments: Pt with fear of falling throughout session during bed mobility and sit<>stand transfers, asking daughter to come help. Requiring verbal and tactile cues for safe transfers and walker placement with stand-pivot transfer. Pt reporting at end of session, that her fear speaks louder than therapists reassurance at times. Pt requiring increased time for sequencing oral care task.        Exercises Other Exercises Other Exercises: 3x sit<>stands with reaching bil UE's anteriorly on chair prior to rise to facilitate anterior weight shift.   Shoulder Instructions       General Comments pt pleasant and conversational througout. Pt consistently with fear of falling. Pt daughter present.    Pertinent Vitals/ Pain       Pain Assessment: Faces Faces Pain Scale: Hurts a little bit Pain Location: L Shoulder Pain Intervention(s): Limited activity within patient's tolerance;Monitored during session  Home Living                                          Prior Functioning/Environment              Frequency  Min 2X/week        Progress Toward Goals  OT Goals(current goals can now be found in the care plan section)  Progress towards OT goals: Progressing toward goals  Acute Rehab OT Goals Patient Stated Goal: participate in therapy OT Goal Formulation: With patient/family Time For Goal  Achievement: 07/30/20 Potential to Achieve Goals: Good ADL Goals Pt Will Transfer to Toilet: with min guard assist;ambulating;bedside commode Pt Will Perform Toileting - Clothing Manipulation and hygiene: with min assist;sit to/from stand;sitting/lateral leans Additional ADL Goal #1: Patient will tolerate 5 minutes standing activity at min G level in order to participate in self care tasks. Additional ADL Goal #2: patient will follow 1 step directions with less than 25% multimodal cues in order to participate in self care tasks.  Plan Discharge plan remains appropriate    Co-evaluation    PT/OT/SLP Co-Evaluation/Treatment: Yes Reason for Co-Treatment: For patient/therapist safety;To address functional/ADL transfers PT goals addressed during session: Mobility/safety with mobility;Balance OT goals addressed during session: ADL's and self-care      AM-PAC OT "6 Clicks" Daily Activity     Outcome Measure   Help from another person eating meals?: A Little Help from another person taking care of personal grooming?: A Little Help from another person toileting, which includes using toliet, bedpan, or urinal?: Total Help from another person bathing (including washing, rinsing, drying)?: A Lot Help from another person to put on and taking off regular upper body clothing?: A Little Help from another person to put on and taking off regular lower body clothing?: Total 6 Click Score: 13    End of Session Equipment Utilized During Treatment:  Gait belt;Rolling walker  OT Visit Diagnosis: Other abnormalities of gait and mobility (R26.89);Muscle weakness (generalized) (M62.81);Other symptoms and signs involving cognitive function;Unsteadiness on feet (R26.81)   Activity Tolerance Patient tolerated treatment well   Patient Left with call bell/phone within reach;with family/visitor present;in chair;with nursing/sitter in room   Nurse Communication Mobility status        Time: 6144-3154 OT  Time Calculation (min): 36 min  Charges: OT General Charges $OT Visit: 1 Visit OT Treatments $Self Care/Home Management : 8-22 mins  Ladene Artist, OTDS    Ladene Artist 07/22/2020, 11:48 AM

## 2020-07-22 NOTE — Progress Notes (Signed)
  Speech Language Pathology Treatment: Dysphagia  Patient Details Name: Sheila Andrade MRN: [id number] DOB: 12/17/1935 Today's Date: 07/22/2020 Time: 7915-0413 SLP Time Calculation (min) (ACUTE ONLY): 14 min  Assessment / Plan / Recommendation Clinical Impression  Pt was seen with her breakfast meal, with functional appearing oropharyngeal swallowing and no overt s/s of aspiration. She believes that her swallowing has improved, particularly since she has slowed down her rate of intake. SLP reviewed results of previous esophageal testing as well as esophageal precautions. Pt verbalized her understanding, stating that her daughter is also a gastroenterologist. Pt utilized several of these precautions during PO intake with Mod I. Recommend that she remain on regular solids and thin liquids. She can continue to take pills as she has been (smaller ones with liquid, larger ones with applesauce), as she reports no further difficulties. No further acute f/u indicated at this time - pt in agreement, so SLP to sign off for now. Please reorder with any acute changes.    HPI HPI: 85 yo female admitted 07/14/20 with failure to thrive, fatigue, weakness, inability to stand. Pt also with suicidal ideation.BSE 7/11 without pt complaints of dysphagia but decreased appetite and no s/s aspiration. UGI 01/08/20 Mild tertiary contractions of the distal esophagus are noted  suggesting presbyesophagus. Being worked up for possible neurological disorder. PMH: recurrent UTI, recent COVID, glaucoma, osteoporosis, HLD, depression      SLP Plan  All goals met       Recommendations  Diet recommendations: Regular;Thin liquid Liquids provided via: Cup;Straw Medication Administration: Whole meds with liquid (as tolerated - pt likes to take larger pills with applesauce) Supervision: Patient able to self feed;Intermittent supervision to cue for compensatory strategies Compensations: Slow rate;Small sips/bites Postural Changes  and/or Swallow Maneuvers: Seated upright 90 degrees;Upright 30-60 min after meal                Oral Care Recommendations: Oral care BID Follow up Recommendations: None SLP Visit Diagnosis: Dysphagia, unspecified (R13.10) Plan: All goals met       GO                Osie Bond., M.A. [facility] Pager [phone] Office [phone]  07/22/2020, 10:36 AM

## 2020-07-22 NOTE — Care Management Important Message (Signed)
Important Message  Patient Details  Name: Sheila Andrade MRN: [id number] Date of Birth: 06/09/1935   Medicare Important Message Given:  Yes     Renie Ora 07/22/2020, 10:13 AM

## 2020-07-23 DIAGNOSIS — F332 Major depressive disorder, recurrent severe without psychotic features: Secondary | ICD-10-CM | POA: Diagnosis not present

## 2020-07-23 DIAGNOSIS — G20 Parkinson's disease: Secondary | ICD-10-CM | POA: Diagnosis not present

## 2020-07-23 LAB — COMPREHENSIVE METABOLIC PANEL
ALT: 11 U/L (ref 0–44)
AST: 13 U/L — ABNORMAL LOW (ref 15–41)
Albumin: 2.7 g/dL — ABNORMAL LOW (ref 3.5–5.0)
Alkaline Phosphatase: 42 U/L (ref 38–126)
Anion gap: 8 (ref 5–15)
BUN: 16 mg/dL (ref 8–23)
CO2: 22 mmol/L (ref 22–32)
Calcium: 8.5 mg/dL — ABNORMAL LOW (ref 8.9–10.3)
Chloride: 107 mmol/L (ref 98–111)
Creatinine, Ser: 0.6 mg/dL (ref 0.44–1.00)
GFR, Estimated: >60 mL/min (ref >60)
Glucose, Bld: 112 mg/dL — ABNORMAL HIGH (ref 70–99)
Potassium: 3.8 mmol/L (ref 3.5–5.1)
Sodium: 137 mmol/L (ref 135–145)
Total Bilirubin: 0.4 mg/dL (ref 0.3–1.2)
Total Protein: 5 g/dL — ABNORMAL LOW (ref 6.5–8.1)

## 2020-07-23 LAB — CBC WITH DIFFERENTIAL/PLATELET
Abs Immature Granulocytes: 0.02 10*3/uL (ref 0.00–0.07)
Basophils Absolute: 0 10*3/uL (ref 0.0–0.1)
Basophils Relative: 1 %
Eosinophils Absolute: 0.1 10*3/uL (ref 0.0–0.5)
Eosinophils Relative: 2 %
HCT: 30.9 % — ABNORMAL LOW (ref 36.0–46.0)
Hemoglobin: 10.1 g/dL — ABNORMAL LOW (ref 12.0–15.0)
Immature Granulocytes: 0 %
Lymphocytes Relative: 31 %
Lymphs Abs: 2 10*3/uL (ref 0.7–4.0)
MCH: 28.9 pg (ref 26.0–34.0)
MCHC: 32.7 g/dL (ref 30.0–36.0)
MCV: 88.3 fL (ref 80.0–100.0)
Monocytes Absolute: 0.6 10*3/uL (ref 0.1–1.0)
Monocytes Relative: 10 %
Neutro Abs: 3.5 10*3/uL (ref 1.7–7.7)
Neutrophils Relative %: 56 %
Platelets: 291 10*3/uL (ref 150–400)
RBC: 3.5 MIL/uL — ABNORMAL LOW (ref 3.87–5.11)
RDW: 14.7 % (ref 11.5–15.5)
WBC: 6.3 10*3/uL (ref 4.0–10.5)
nRBC: 0 % (ref 0.0–0.2)

## 2020-07-23 LAB — BRAIN NATRIURETIC PEPTIDE: B Natriuretic Peptide: 94.5 pg/mL (ref 0.0–100.0)

## 2020-07-23 LAB — MAGNESIUM: Magnesium: 2 mg/dL (ref 1.7–2.4)

## 2020-07-23 MED ORDER — FLUOXETINE HCL 20 MG PO CAPS: 20.0000 mg | ORAL_CAPSULE | Freq: Every day | ORAL | Status: DC

## 2020-07-23 MED ORDER — LACTATED RINGERS IV SOLN: INTRAVENOUS | Status: AC

## 2020-07-23 MED ORDER — LACTATED RINGERS IV SOLN: INTRAVENOUS | Status: DC

## 2020-07-23 NOTE — Progress Notes (Signed)
Neurology Progress Note  Patient ID: Sheila Andrade is a 85 y.o. with PMHx of  has a past medical history of Hot flashes (06/06/2020) and Hypertension.  Initially consulted for: Parkinsonian features   Major recent events - MRI c+t spine wwo showed no e/o cord compression or explanation for BLE weakness -Abilify discontinued on 7/13 -Psych cross-tapering effexor to prozac  Subjective: - Mood not as bright as yesterday - Denies current SI/HI - No new neurologic complaints  Exam: Vitals:   07/23/20 1200 07/23/20 1533  BP: 133/75 135/83  Pulse: 78 82  Resp:  16  Temp: 97.8 F (36.6 C) 98.1 F (36.7 C)  SpO2: 99% 97%   Gen: In bed, comfortable  Resp: non-labored breathing, no grossly audible wheezing Cardiac: Perfusing extremities well  Abd: soft, nt Psych: affect remains flat, but less masked than on my initial evaluation Extremities: Bilateral feet in extreme plantarflexion with toes dorsiflexed concerning for Charcot feet  Neuro: MS: Aware, alert, oriented  CN: Face symmetric, tongue midline, EOMI with saccadic pursuits Motor: Still has cogwheeling, improved based on documented exams by Dr. Iver Nestle last week.  Patient is not as severely bradykinetic.  Tone remains increased throughout but less so than prior Sensory: Equally reactive to light touch throughout DTR: 3+ and symmetric patellar's and brachioradialis  Pertinent Labs:  Basic Metabolic Panel: Recent Labs  Lab 07/19/20 0448 07/20/20 0538 07/21/20 0544 07/22/20 0205 07/23/20 0337  NA 132* 133* 134* 135 137  K 3.8 4.0 3.8 3.6 3.8  CL 102 99 101 107 107  CO2 23 27 26 22 22   GLUCOSE 94 94 116* 146* 112*  BUN 13 16 11 19 16   CREATININE 0.53 0.66 0.67 0.63 0.60  CALCIUM 8.9 9.4 9.1 8.9 8.5*  MG 2.0 2.1 2.0 1.9 2.0     CBC: Recent Labs  Lab 07/18/20 0457 07/21/20 0544 07/22/20 0205 07/23/20 0337  WBC 6.9 6.0 6.4 6.3  NEUTROABS 4.0 3.7 4.0 3.5  HGB 12.7 12.6 10.3* 10.1*  HCT 39.6 40.1 32.4* 30.9*   MCV 89.6 89.5 89.0 88.3  PLT 315 286 297 291     Coagulation Studies: No results for input(s): LABPROT, INR in the last 72 hours.   Hyponatremia remains stable and not severe, CBC remained stable and within normal limits  Impression: This is an 85 year old woman suffering significant depression however with marked improvement of parkinsonian features off of Abilify.  Continue to feel she may have an atypical Parkinson's syndrome which is best evaluated by outpatient neurology.  Given the findings of her lower extremities there may be significant neuropathy as well, that would require outpatient EMG nerve conduction study to clarify.  She does appear to be very sensitive to even the mildest D2 receptor blockers, and again I defer management of her mood symptoms to psychiatry given their severity. They are cross-tapering effexor to prozac currently.   MRI c+t spine wwo showed no e/o cord compression or other etiology for BLE weakness.  Recommendations: - No further inpatient neurologic workup indicated. -Outpatient referrals to neurology and neuropsychology placed at the time of my initial consultation. Based on interval progress the following may be considered as o/p: formal neurocognitive evaluation, EMG/NCS, sinemet trial  Plan d/w patient and daughter 07/25/20 by phone who are in agreement. Neurology to sign off, but please re-engage if additional neurologic concerns arise.   97, MD [facility] [phone]  If 7pm- 7am, please page neurology on call as listed in AMION.

## 2020-07-23 NOTE — Progress Notes (Signed)
PROGRESS NOTE                                                                                                                                                                                                             Patient Demographics:    Sheila Andrade, is a 85 y.o. female, DOB - Jun 07, 1935, ZOX:[id number]  Outpatient Primary MD for the patient is Dalphine HandingShah, Billie Rose, MD    LOS - 7  Admit date - 07/14/2020    Chief Complaint  Patient presents with   Failure To Thrive   Fatigue       Brief Narrative (HPI from H&P)  - 85 year old female with history of depression, ongoing functional decline, fall with history of pelvic fracture, recent development of tremors and bradykinesia who was brought in for suicidal ideation, decreased oral intake and ongoing generalized deconditioning, was seen by psych and neurology also as she had ongoing functional decline with bradykinesia.  [facility] for continued neurology follow-up.   Subjective:   Patient in bed denies any headache chest or abdominal pain, no new focal weakness.  Appetite is improved.   Assessment  & Plan :     Severe depression and suicidal ideation.  Seen by psych psych is following.  Currently not actively suicidal or homicidal, still mildly anxious, on Effexor and trazodone and will defer all psych issues to psychiatry directly, have requested them to follow on a daily basis.  2.  Failure to thrive and ongoing functional decline with decreased oral intake.  Supportive care, psych treatment as above.  Continue oral protein supplementation, oral intake has improved, gentle IV fluids for another 12 hours, will require SNF/CIR.  3.  Poor balance, bradykinesia, hyperreflexia in both lower extremities.  MRI brain, C and L-spine unremarkable, neurology is following, this discussed with neurologist  on 07/23/2020 outpatient neurology work-up with continued PT OT and placement.  4.  Vitamin B12 deficiency.  Placed on replacement.  5.  Mild hyponatremia.  Improving with IV fluids.  6.  Moderate to severe protein calorie malnutrition.  On supplements.  7.  Abnormal UA.  UTI ruled out clinically.  Cultures.      Condition -fair  Family Communication  : Message left for patient's daughter DC through patient's phone on 07/22/2020,  daughter updated bedside on 07/23/2020  Code Status : Full  Consults  : Psychiatry, neurology  PUD Prophylaxis : None   Procedures  :     MRI brain.  Nonacute.      Disposition Plan  :    Status is: Inpatient  Remains inpatient appropriate because:IV treatments appropriate due to intensity of illness or inability to take PO  Dispo: The patient is from: Home              Anticipated d/c is to: SNF              Patient currently is not medically stable to d/c.   Difficult to place patient No  DVT Prophylaxis  :    enoxaparin (LOVENOX) injection 40 mg Start: 07/21/20 1200 SCDs Start: 07/15/20 0036   Lab Results  Component Value Date   PLT 291 07/23/2020    Diet :  Diet Order             Diet regular Room service appropriate? Yes; Fluid consistency: Thin  Diet effective now                    Inpatient Medications  Scheduled Meds:  brinzolamide  1 drop Left Eye BID   cyanocobalamin  1,000 mcg Intramuscular Q0600   enoxaparin (LOVENOX) injection  40 mg Subcutaneous Q24H   feeding supplement  237 mL Oral BID BM   FLUoxetine  10 mg Oral Daily   [START ON 07/28/2020] FLUoxetine  20 mg Oral Daily   latanoprost  1 drop Left Eye QHS   megestrol  400 mg Oral Daily   multivitamin with minerals  1 tablet Oral Daily   simvastatin  20 mg Oral Daily   traZODone  25 mg Oral QHS   [START ON 07/28/2020] venlafaxine XR  37.5 mg Oral Q breakfast   venlafaxine XR  75 mg Oral Q breakfast   Continuous Infusions:  lactated ringers 50 mL/hr  at 07/23/20 1028   PRN Meds:.acetaminophen **OR** acetaminophen, hydrOXYzine, polyethylene glycol  Antibiotics  :    Anti-infectives (From admission, onward)    Start     Dose/Rate Route Frequency Ordered Stop   07/15/20 2000  cefTRIAXone (ROCEPHIN) 1 g in sodium chloride 0.9 % 100 mL IVPB  Status:  Discontinued        1 g 200 mL/hr over 30 Minutes Intravenous Every 24 hours 07/15/20 0000 07/15/20 1635   07/14/20 2315  cefTRIAXone (ROCEPHIN) 1 g in sodium chloride 0.9 % 100 mL IVPB        1 g 200 mL/hr over 30 Minutes Intravenous  Once 07/14/20 2307 07/15/20 0040        Time Spent in minutes  30   Susa Raring M.D on 07/23/2020 at 11:57 AM  To page go to [email or web address]   [facility] -  Office  [phone]  See all Orders from today for further details    Objective:   Vitals:   07/22/20 2152 07/22/20 2330 07/23/20 0325 07/23/20 0751  BP: 129/75 137/76 (!) 128/51 139/72  Pulse: 80 79 79 78  Resp: 19 18 18    Temp: 97.8 F (36.6 C) 98.9 F (37.2 C) 98 F (36.7 C) 98.1 F (36.7 C)  TempSrc: Oral Oral Oral Oral  SpO2: 97% 96% 97% 97%  Weight:      Height:        Wt Readings from Last 3 Encounters:  07/14/20 51.3 kg  05/31/20 54.4 kg  05/27/20 52.8 kg    No intake or output data in the 24 hours ending 07/23/20 1157    Physical Exam  Mildly somnolent but waking up, alert x 2, bilateral lower extremity strength 4/5 with increased tone and hyperreflexia, anxious affect Hurricane.AT,PERRAL Supple Neck,No JVD, No cervical lymphadenopathy appriciated.  Symmetrical Chest wall movement, Good air movement bilaterally, CTAB RRR,No Gallops, Rubs or new Murmurs, No Parasternal Heave +ve B.Sounds, Abd Soft, No tenderness, No organomegaly appriciated, No rebound - guarding or rigidity. No Cyanosis, Clubbing or edema, No new Rash or bruise      Data Review:    CBC Recent Labs  Lab 07/18/20 0457 07/21/20 0544 07/22/20 0205 07/23/20 0337  WBC 6.9 6.0 6.4 6.3   HGB 12.7 12.6 10.3* 10.1*  HCT 39.6 40.1 32.4* 30.9*  PLT 315 286 297 291  MCV 89.6 89.5 89.0 88.3  MCH 28.7 28.1 28.3 28.9  MCHC 32.1 31.4 31.8 32.7  RDW 14.5 14.3 14.4 14.7  LYMPHSABS 1.8 1.6 1.6 2.0  MONOABS 0.8 0.6 0.6 0.6  EOSABS 0.2 0.1 0.1 0.1  BASOSABS 0.1 0.1 0.1 0.0    Recent Labs  Lab 07/19/20 0448 07/20/20 0538 07/21/20 0544 07/22/20 0205 07/22/20 1127 07/23/20 0337  NA 132* 133* 134* 135  --  137  K 3.8 4.0 3.8 3.6  --  3.8  CL 102 99 101 107  --  107  CO2 23 27 26 22   --  22  GLUCOSE 94 94 116* 146*  --  112*  BUN 13 16 11 19   --  16  CREATININE 0.53 0.66 0.67 0.63  --  0.60  CALCIUM 8.9 9.4 9.1 8.9  --  8.5*  AST  --   --  19 13*  --  13*  ALT  --   --  13 12  --  11  ALKPHOS  --   --  60 50  --  42  BILITOT  --   --  0.3 0.3  --  0.4  ALBUMIN  --   --  3.4* 2.8*  --  2.7*  MG 2.0 2.1 2.0 1.9  --  2.0  TSH  --   --   --   --  1.380  --   BNP  --   --   --   --   --  94.5    ------------------------------------------------------------------------------------------------------------------ No results for input(s): CHOL, HDL, LDLCALC, TRIG, CHOLHDL, LDLDIRECT in the last 72 hours.  No results found for: HGBA1C ------------------------------------------------------------------------------------------------------------------ Recent Labs    07/22/20 1127  TSH 1.380    Cardiac Enzymes No results for input(s): CKMB, TROPONINI, MYOGLOBIN in the last 168 hours.  Invalid input(s): CK ------------------------------------------------------------------------------------------------------------------    Component Value Date/Time   BNP 94.5 07/23/2020 0337     Radiology Reports DG Chest 2 View  Result Date: 07/14/2020 CLINICAL DATA:  Failure to thrive, weight loss EXAM: CHEST - 2 VIEW COMPARISON:  Radiograph 05/31/2020 FINDINGS: Suspect a small calcified mediastinal lymph node seen on lateral radiograph. Remaining cardiomediastinal contours are  unremarkable. Bandlike opacity in the right lung base, likely scarring given similar to prior. No consolidation, features of edema, pneumothorax, or effusion. Degenerative changes are present in the imaged spine and shoulders. Evidence of calcific tendinosis in the left shoulder. Left cervical carotid atherosclerosis. IMPRESSION: Suspect a small calcified mediastinal/hilar node. Can reflect sequela of prior granulomatous disease. No acute cardiopulmonary abnormality. Electronically Signed   By: 09/14/2020 M.D.   On: 07/14/2020  20:56   MR BRAIN WO CONTRAST  Result Date: 07/14/2020 CLINICAL DATA:  Mental status change. EXAM: MRI HEAD WITHOUT CONTRAST TECHNIQUE: Multiplanar, multiecho pulse sequences of the brain and surrounding structures were obtained without intravenous contrast. COMPARISON:  None. FINDINGS: Brain: No acute infarction, acute hemorrhage, hydrocephalus, extra-axial collection or mass lesion. Moderate scattered T2/FLAIR hyperintensities within the white matter, which are nonspecific but most likely related to chronic microvascular ischemic disease given patient age. Mild-to-moderate atrophy with ex vacuo ventricular dilation. The callosal angle is not acute and there is no crowding of sulci at the vertex. Dilated perivascular space in the inferior left basal ganglia. Small focus of susceptibility artifact in the lateral left cerebellum, most likely a prior microhemorrhage. Vascular: Major arterial flow voids are maintained skull base. Skull and upper cervical spine: Normal marrow signal. Sinuses/Orbits: Visualized sinuses are clear. No acute orbital findings. Other: Small left mastoid effusion. IMPRESSION: 1. No evidence of acute intracranial abnormality 2. Moderate chronic microvascular ischemic disease and mild-to-moderate atrophy. 3. Small left mastoid effusion. Electronically Signed   By: Feliberto Harts MD   On: 07/14/2020 20:11   MR CERVICAL SPINE W WO CONTRAST  Result Date:  07/22/2020 CLINICAL DATA:  Hyper-reflexia and abnormal gait EXAM: MRI CERVICAL AND THORACIC SPINE WITHOUT AND WITH CONTRAST TECHNIQUE: Multiplanar and multiecho pulse sequences of the cervical spine, to include the craniocervical junction and cervicothoracic junction, and the thoracic spine, were obtained without and with intravenous contrast. CONTRAST:  98mL GADAVIST GADOBUTROL 1 MMOL/ML IV SOLN COMPARISON:  None. FINDINGS: MRI CERVICAL SPINE FINDINGS Alignment: Physiologic. Vertebrae: No fracture, evidence of discitis, or bone lesion. Cord: Normal signal and morphology. Posterior Fossa, vertebral arteries, paraspinal tissues: Negative. Disc levels: Axial images are severely motion degraded. C1-2: Unremarkable. C2-3: Normal disc space and facet joints. There is no spinal canal stenosis. No neural foraminal stenosis. C3-4: Normal disc space and facet joints. There is no spinal canal stenosis. No neural foraminal stenosis. C4-5: Normal disc space and facet joints. There is no spinal canal stenosis. No neural foraminal stenosis. C5-6: Small left asymmetric disc bulge. There is no spinal canal stenosis. Mild left neural foraminal stenosis. C6-7: Left asymmetric disc bulge with uncovertebral spurring. There is no spinal canal stenosis. Moderate left neural foraminal stenosis. C7-T1: Normal disc space and facet joints. There is no spinal canal stenosis. No neural foraminal stenosis. MRI THORACIC SPINE FINDINGS Alignment:  Physiologic. Vertebrae: No fracture, evidence of discitis, or bone lesion. Cord:  Normal signal and morphology. Paraspinal and other soft tissues: Negative. Disc levels: Severely motion degraded axial images. No spinal canal stenosis. There is a small central disc protrusion noted at T7-8. No abnormal contrast enhancement. IMPRESSION: 1. Motion degraded study. 2. No acute abnormality of the cervical or thoracic spine. 3. Moderate left C6-7 and mild left C5-6 neural foraminal stenosis. 4. No spinal canal  stenosis. Electronically Signed   By: Deatra Robinson M.D.   On: 07/22/2020 21:07   MR THORACIC SPINE W WO CONTRAST  Result Date: 07/22/2020 CLINICAL DATA:  Hyper-reflexia and abnormal gait EXAM: MRI CERVICAL AND THORACIC SPINE WITHOUT AND WITH CONTRAST TECHNIQUE: Multiplanar and multiecho pulse sequences of the cervical spine, to include the craniocervical junction and cervicothoracic junction, and the thoracic spine, were obtained without and with intravenous contrast. CONTRAST:  55mL GADAVIST GADOBUTROL 1 MMOL/ML IV SOLN COMPARISON:  None. FINDINGS: MRI CERVICAL SPINE FINDINGS Alignment: Physiologic. Vertebrae: No fracture, evidence of discitis, or bone lesion. Cord: Normal signal and morphology. Posterior Fossa, vertebral arteries, paraspinal tissues:  Negative. Disc levels: Axial images are severely motion degraded. C1-2: Unremarkable. C2-3: Normal disc space and facet joints. There is no spinal canal stenosis. No neural foraminal stenosis. C3-4: Normal disc space and facet joints. There is no spinal canal stenosis. No neural foraminal stenosis. C4-5: Normal disc space and facet joints. There is no spinal canal stenosis. No neural foraminal stenosis. C5-6: Small left asymmetric disc bulge. There is no spinal canal stenosis. Mild left neural foraminal stenosis. C6-7: Left asymmetric disc bulge with uncovertebral spurring. There is no spinal canal stenosis. Moderate left neural foraminal stenosis. C7-T1: Normal disc space and facet joints. There is no spinal canal stenosis. No neural foraminal stenosis. MRI THORACIC SPINE FINDINGS Alignment:  Physiologic. Vertebrae: No fracture, evidence of discitis, or bone lesion. Cord:  Normal signal and morphology. Paraspinal and other soft tissues: Negative. Disc levels: Severely motion degraded axial images. No spinal canal stenosis. There is a small central disc protrusion noted at T7-8. No abnormal contrast enhancement. IMPRESSION: 1. Motion degraded study. 2. No acute  abnormality of the cervical or thoracic spine. 3. Moderate left C6-7 and mild left C5-6 neural foraminal stenosis. 4. No spinal canal stenosis. Electronically Signed   By: Deatra Robinson M.D.   On: 07/22/2020 21:07   DG Hip Unilat W or Wo Pelvis 2-3 Views Left  Result Date: 07/14/2020 CLINICAL DATA:  Failure to thrive and weight loss EXAM: DG HIP (WITH OR WITHOUT PELVIS) 2-3V LEFT COMPARISON:  01/01/2020 FINDINGS: Pelvic ring is intact. Old healed pubic rami fractures are noted on the left stable from prior CT examination. Degenerative changes of the hip joints are noted right greater than left. No acute fracture or dislocation in the left hip is noted. Some widening of the left sacroiliac joint is noted which appears more prominent than that seen on prior CT. IMPRESSION: Slight widening of the left sacroiliac joint which may be projectional in nature. Degenerative changes of the hip joints bilaterally right greater than left. No acute fracture is seen. Old fractures in the left pubic rami are seen. Electronically Signed   By: Alcide Clever M.D.   On: 07/14/2020 20:56

## 2020-07-23 NOTE — Progress Notes (Signed)
Inpatient Rehab Admissions Coordinator:   I discussed case with rehab MD and put case through to Pt.'s insurance. I left a VM with pt.'s daughter Corrie Dandy with update.   Megan Salon, MS, CCC-SLP Rehab Admissions Coordinator  [phone] (celll) [phone] (office)

## 2020-07-23 NOTE — Progress Notes (Signed)
Pt Daughter in Teacher, music at bedside. Asked for note to be placed in chart that patient takes Megace PO liquid by syringe placed in side of cheek.

## 2020-07-23 NOTE — Consult Note (Addendum)
[facility] Face-to-Face Psychiatry Consult   Reason for Consult:  "severe depression/intermittent suicidal ideation" Referring Physician:  Glade Lloyd Patient Identification: Sheila Andrade MRN:  [id number] Principal Diagnosis: MDD (major depressive disorder), recurrent episode, severe (HCC) Diagnosis:  Principal Problem:   MDD (major depressive disorder), recurrent episode, severe (HCC) Active Problems:   Weight loss   Anemia   UTI (urinary tract infection)   Hyponatremia   Suicidal ideations   Dehydration   Total Time spent with patient: 20 minutes  Subjective:   Sheila Andrade is a 85 y.o. female patient admitted due to confusion and inability to stand as well as weakness and loss of balance.  HPI:  Pt is a 85 yo female w/ depression secondary to medical problems. Pt seen at bedside. Pt initially too sedated to speak to this morning but was seen later in morning and was alert. Pt stated she feels slightly less depressed and tired. Pt denies SI/HI/AH. Pt states she occasionally sees shadow in corner of her eye.  Per daughter-in-law at bedside, pt has been somnolent since ativan administration prior to MRI last evening. Per daughter-in-law, pt had been endorsing visual hallucinations (children on the ceiling) as well as conversing with someone yesterday. Per daughter-in-law this has never happened prior to the past few days.   Past Psychiatric History: MDD  Risk to Self:   No Risk to Others:   No Prior Inpatient Therapy:   None Prior Outpatient Therapy:   yes  Past Medical History:  Past Medical History:  Diagnosis Date   Hot flashes 06/06/2020   Hypertension    History reviewed. No pertinent surgical history. Family History:  Family History  Problem Relation Age of Onset   Hypertension Other    Family Psychiatric  History: n/a Social History:  Social History   Substance and Sexual Activity  Alcohol Use None     Social History   Substance and Sexual Activity  Drug  Use Never    Social History   Socioeconomic History   Marital status: Widowed    Spouse name: Not on file   Number of children: Not on file   Years of education: Not on file   Highest education level: Not on file  Occupational History   Not on file  Tobacco Use   Smoking status: Never   Smokeless tobacco: Never  Substance and Sexual Activity   Alcohol use: Not on file   Drug use: Never   Sexual activity: Not on file  Other Topics Concern   Not on file  Social History Narrative   Not on file   Social Determinants of Health   Financial Resource Strain: Not on file  Food Insecurity: Not on file  Transportation Needs: Not on file  Physical Activity: Not on file  Stress: Not on file  Social Connections: Not on file   Additional Social History:    Allergies:  No Known Allergies  Labs:  Results for orders placed or performed during the hospital encounter of 07/14/20 (from the past 48 hour(s))  CBC with Differential/Platelet     Status: Abnormal   Collection Time: 07/22/20  2:05 AM  Result Value Ref Range   WBC 6.4 4.0 - 10.5 K/uL   RBC 3.64 (L) 3.87 - 5.11 MIL/uL   Hemoglobin 10.3 (L) 12.0 - 15.0 g/dL   HCT 42.5 (L) 95.6 - 38.7 %   MCV 89.0 80.0 - 100.0 fL   MCH 28.3 26.0 - 34.0 pg   MCHC 31.8 30.0 - 36.0  g/dL   RDW 16.1 09.6 - 04.5 %   Platelets 297 150 - 400 K/uL   nRBC 0.0 0.0 - 0.2 %   Neutrophils Relative % 62 %   Neutro Abs 4.0 1.7 - 7.7 K/uL   Lymphocytes Relative 25 %   Lymphs Abs 1.6 0.7 - 4.0 K/uL   Monocytes Relative 10 %   Monocytes Absolute 0.6 0.1 - 1.0 K/uL   Eosinophils Relative 2 %   Eosinophils Absolute 0.1 0.0 - 0.5 K/uL   Basophils Relative 1 %   Basophils Absolute 0.1 0.0 - 0.1 K/uL   Immature Granulocytes 0 %   Abs Immature Granulocytes 0.02 0.00 - 0.07 K/uL    Comment: Performed at [facility], [address] [place], [place] [address]  Comprehensive metabolic panel     Status: Abnormal   Collection Time: 07/22/20  2:05 AM   Result Value Ref Range   Sodium 135 135 - 145 mmol/L   Potassium 3.6 3.5 - 5.1 mmol/L   Chloride 107 98 - 111 mmol/L   CO2 22 22 - 32 mmol/L   Glucose, Bld 146 (H) 70 - 99 mg/dL    Comment: Glucose reference range applies only to samples taken after fasting for at least 8 hours.   BUN 19 8 - 23 mg/dL   Creatinine, Ser 1.91 0.44 - 1.00 mg/dL   Calcium 8.9 8.9 - 47.8 mg/dL   Total Protein 5.0 (L) 6.5 - 8.1 g/dL   Albumin 2.8 (L) 3.5 - 5.0 g/dL   AST 13 (L) 15 - 41 U/L   ALT 12 0 - 44 U/L   Alkaline Phosphatase 50 38 - 126 U/L   Total Bilirubin 0.3 0.3 - 1.2 mg/dL   GFR, Estimated >29 >56 mL/min    Comment: (NOTE) Calculated using the CKD-EPI Creatinine Equation (2021)    Anion gap 6 5 - 15    Comment: Performed at [facility], [address], [place], [place] [address]  Magnesium     Status: None   Collection Time: 07/22/20  2:05 AM  Result Value Ref Range   Magnesium 1.9 1.7 - 2.4 mg/dL    Comment: Performed at [facility], [address] [place], [place] [address]  TSH     Status: None   Collection Time: 07/22/20 11:27 AM  Result Value Ref Range   TSH 1.380 0.350 - 4.500 uIU/mL    Comment: Performed by a 3rd Generation assay with a functional sensitivity of <=0.01 uIU/mL. Performed at [facility], [address], [place], [place] [address]   Brain natriuretic peptide     Status: None   Collection Time: 07/23/20  3:37 AM  Result Value Ref Range   B Natriuretic Peptide 94.5 0.0 - 100.0 pg/mL    Comment: Performed at [facility], [address] [place], [place] [address]  CBC with Differential/Platelet     Status: Abnormal   Collection Time: 07/23/20  3:37 AM  Result Value Ref Range   WBC 6.3 4.0 - 10.5 K/uL   RBC 3.50 (L) 3.87 - 5.11 MIL/uL   Hemoglobin 10.1 (L) 12.0 - 15.0 g/dL   HCT 32.4 (L) 40.1 - 02.7 %   MCV 88.3 80.0 - 100.0 fL   MCH 28.9 26.0 - 34.0 pg   MCHC 32.7 30.0 - 36.0 g/dL   RDW 25.3 66.4 - 40.3 %   Platelets 291 150 - 400  K/uL   nRBC  0.0 0.0 - 0.2 %   Neutrophils Relative % 56 %   Neutro Abs 3.5 1.7 - 7.7 K/uL   Lymphocytes Relative 31 %   Lymphs Abs 2.0 0.7 - 4.0 K/uL   Monocytes Relative 10 %   Monocytes Absolute 0.6 0.1 - 1.0 K/uL   Eosinophils Relative 2 %   Eosinophils Absolute 0.1 0.0 - 0.5 K/uL   Basophils Relative 1 %   Basophils Absolute 0.0 0.0 - 0.1 K/uL   Immature Granulocytes 0 %   Abs Immature Granulocytes 0.02 0.00 - 0.07 K/uL    Comment: Performed at [facility], [address], [place], [place] [address]  Comprehensive metabolic panel     Status: Abnormal   Collection Time: 07/23/20  3:37 AM  Result Value Ref Range   Sodium 137 135 - 145 mmol/L   Potassium 3.8 3.5 - 5.1 mmol/L   Chloride 107 98 - 111 mmol/L   CO2 22 22 - 32 mmol/L   Glucose, Bld 112 (H) 70 - 99 mg/dL    Comment: Glucose reference range applies only to samples taken after fasting for at least 8 hours.   BUN 16 8 - 23 mg/dL   Creatinine, Ser 2.70 0.44 - 1.00 mg/dL   Calcium 8.5 (L) 8.9 - 10.3 mg/dL   Total Protein 5.0 (L) 6.5 - 8.1 g/dL   Albumin 2.7 (L) 3.5 - 5.0 g/dL   AST 13 (L) 15 - 41 U/L   ALT 11 0 - 44 U/L   Alkaline Phosphatase 42 38 - 126 U/L   Total Bilirubin 0.4 0.3 - 1.2 mg/dL   GFR, Estimated >62 >37 mL/min    Comment: (NOTE) Calculated using the CKD-EPI Creatinine Equation (2021)    Anion gap 8 5 - 15    Comment: Performed at [facility], [address], [place], [place] [address]  Magnesium     Status: None   Collection Time: 07/23/20  3:37 AM  Result Value Ref Range   Magnesium 2.0 1.7 - 2.4 mg/dL    Comment: Performed at [facility], [address] [place], [place] [address]    Current Facility-Administered Medications  Medication Dose Route Frequency Provider Last Rate Last Admin   acetaminophen (TYLENOL) tablet 650 mg  650 mg Oral Q6H PRN Therisa Doyne, MD   650 mg at 07/18/20 1112   Or   acetaminophen (TYLENOL) suppository 650 mg  650 mg Rectal Q6H PRN  Doutova, Anastassia, MD       brinzolamide (AZOPT) 1 % ophthalmic suspension 1 drop  1 drop Left Eye BID Doutova, Anastassia, MD   1 drop at 07/22/20 2200   cyanocobalamin ((VITAMIN B-12)) injection 1,000 mcg  1,000 mcg Intramuscular Q0600 Glade Lloyd, MD   1,000 mcg at 07/23/20 0610   enoxaparin (LOVENOX) injection 40 mg  40 mg Subcutaneous Q24H Alekh, Kshitiz, MD   40 mg at 07/22/20 1112   feeding supplement (ENSURE ENLIVE / ENSURE PLUS) liquid 237 mL  237 mL Oral BID BM Doutova, Anastassia, MD   237 mL at 07/22/20 1548   FLUoxetine (PROZAC) capsule 10 mg  10 mg Oral Daily Akintayo, Mojeed, MD   10 mg at 07/22/20 1059   [START ON 07/28/2020] FLUoxetine (PROZAC) capsule 20 mg  20 mg Oral Daily Park Pope, MD       hydrOXYzine (ATARAX/VISTARIL) tablet 25 mg  25 mg Oral TID PRN Bobbye Morton, MD       lactated ringers  infusion   Intravenous Continuous Leroy SeaSingh, Prashant K, MD       latanoprost (XALATAN) 0.005 % ophthalmic solution 1 drop  1 drop Left Eye QHS Therisa Doyneoutova, Anastassia, MD   1 drop at 07/22/20 2202   megestrol (MEGACE) 400 MG/10ML suspension 400 mg  400 mg Oral Daily Glade LloydAlekh, Kshitiz, MD   400 mg at 07/22/20 1059   multivitamin with minerals tablet 1 tablet  1 tablet Oral Daily Glade LloydAlekh, Kshitiz, MD   1 tablet at 07/22/20 1059   polyethylene glycol (MIRALAX / GLYCOLAX) packet 17 g  17 g Oral Daily PRN Calvert Cantorizwan, Saima, MD   17 g at 07/19/20 1058   simvastatin (ZOCOR) tablet 20 mg  20 mg Oral Daily Doutova, Anastassia, MD   20 mg at 07/22/20 1059   traZODone (DESYREL) tablet 25 mg  25 mg Oral QHS Doutova, Anastassia, MD   25 mg at 07/21/20 2104   [START ON 07/28/2020] venlafaxine XR (EFFEXOR-XR) 24 hr capsule 37.5 mg  37.5 mg Oral Q breakfast Akintayo, Mojeed, MD       venlafaxine XR (EFFEXOR-XR) 24 hr capsule 75 mg  75 mg Oral Q breakfast Jannifer FranklinAkintayo, Mojeed, MD   75 mg at 07/23/20 0748     Psychiatric Specialty Exam:  Presentation  General Appearance: Appropriate for Environment; Casual  Eye  Contact:Minimal  Speech:Slow; Clear and Coherent  Speech Volume:Decreased   Mood and Affect  Mood:Depressed; Dysphoric  Affect:Depressed; Flat   Thought Process  Thought Processes:Coherent; Linear; Goal Directed  Descriptions of Associations:Intact  Orientation:Full (Time, Place and Person)  Thought Content:Logical  History of Schizophrenia/Schizoaffective disorder:No data recorded Duration of Psychotic Symptoms:No data recorded Hallucinations:No data recorded Ideas of Reference:None  Suicidal Thoughts:No data recorded Homicidal Thoughts:No data recorded  Sensorium  Memory:Recent Good; Immediate Good; Remote Good  Judgment:Fair  Insight:Fair   Executive Functions  Concentration:Fair  Attention Span:Good  Recall:Good  Fund of Knowledge:Good  Language:Good   Psychomotor Activity  Psychomotor Activity: No data recorded  Assets  Assets:Desire for Improvement; Manufacturing systems engineerCommunication Skills; Housing; Social Support; Resilience; Physical Health; Leisure Time; Vocational/Educational; Talents/Skills; Transportation; Financial Resources/Insurance   Sleep  Sleep: No data recorded  Physical Exam: Physical Exam Vitals reviewed.  Constitutional:      Appearance: Normal appearance.  HENT:     Head: Normocephalic and atraumatic.  Eyes:     Extraocular Movements: Extraocular movements intact.  Cardiovascular:     Rate and Rhythm: Normal rate and regular rhythm.  Pulmonary:     Effort: Pulmonary effort is normal.     Breath sounds: Normal breath sounds.  Abdominal:     General: Abdomen is flat.     Palpations: Abdomen is soft.  Musculoskeletal:     Cervical back: Normal range of motion.  Skin:    General: Skin is warm and dry.  Neurological:     General: No focal deficit present.     Mental Status: She is alert and oriented to person, place, and time.   Review of Systems  Constitutional:  Negative for chills and fever.  Respiratory:  Negative for cough,  shortness of breath and wheezing.   Cardiovascular:  Negative for chest pain and palpitations.  Gastrointestinal:  Negative for abdominal pain, nausea and vomiting.  Skin:  Negative for itching and rash.  Neurological:  Negative for dizziness and headaches.  Blood pressure 139/72, pulse 78, temperature 98.1 F (36.7 C), temperature source Oral, resp. rate 18, height 5\' 7"  (1.702 m), weight 51.3 kg, SpO2 97 %. Body mass index is  17.7 kg/m.  Treatment Plan Summary: Pt is a 85 yo female w/ depression secondary to medical problems. Reported hallucinations can be due to effexor medication.  Depression related to medical problems vs MDD -Continue Effexor XR 75 mg qd until 07/28/20 then 37.5 mg qd until 08/04/20 then stop. -Continue Prozac 10 mg until 07/28/20 then increase to Prozac 20 mg daily for depression.  -Continue Megace 400 mg daily for appetite stimulation -Continue Trazodone 25 mg at bedtime for sleep -Recommend outpatient psychiatry referral for med management upon discharge -Recommend TOC consult -Recommend Atarax/Vistaril 25 mg tid prn for anxiety -Recommend avoiding Ativan for patient as patient becomes too sedated with Ativan -will continue to follow  Disposition: No evidence of imminent risk to self or others at present.   Patient does not meet criteria for psychiatric inpatient admission. Supportive therapy provided about ongoing stressors.  Thank you for the consult Park Pope, MD PGY1 Psychiatry Resident 07/23/2020 9:07 AM

## 2020-07-23 NOTE — Plan of Care (Signed)
No issues over night. Pt has been sleep and turned throughout the night.  Problem: Education: Goal: Knowledge of General Education information will improve Description: Including pain rating scale, medication(s)/side effects and non-pharmacologic comfort measures Outcome: Progressing   Problem: Health Behavior/Discharge Planning: Goal: Ability to manage health-related needs will improve Outcome: Progressing   Problem: Clinical Measurements: Goal: Ability to maintain clinical measurements within normal limits will improve Outcome: Progressing Goal: Will remain free from infection Outcome: Progressing Goal: Diagnostic test results will improve Outcome: Progressing Goal: Respiratory complications will improve Outcome: Progressing Goal: Cardiovascular complication will be avoided Outcome: Progressing   Problem: Activity: Goal: Risk for activity intolerance will decrease Outcome: Progressing   Problem: Nutrition: Goal: Adequate nutrition will be maintained Outcome: Progressing   Problem: Coping: Goal: Level of anxiety will decrease Outcome: Progressing   Problem: Elimination: Goal: Will not experience complications related to bowel motility Outcome: Progressing Goal: Will not experience complications related to urinary retention Outcome: Progressing   Problem: Pain Managment: Goal: General experience of comfort will improve Outcome: Progressing   Problem: Safety: Goal: Ability to remain free from injury will improve Outcome: Progressing   Problem: Skin Integrity: Goal: Risk for impaired skin integrity will decrease Outcome: Progressing

## 2020-07-23 NOTE — PMR Pre-admission (Signed)
PMR Admission Coordinator Pre-Admission Assessment  Patient: Sheila Andrade is an 85 y.o., female MRN: [id number] DOB: 1935/12/28 Height: _0  (170.2 cm) Weight: 51.3 kg  Insurance Information HMO:    PPO: yes     PCP:      IPA:      80/20:      OTHER:  PRIMARY: [employer] Medicare      Policy#: [id number]      Subscriber: Pt CM Name: [name]      Phone#: [phone] X [phone]     Fax#: [phone] Pre-Cert#: [id number]      Employer: n/a Benefits: Online Phone: n/a - online at [email or web address] Eff Date: 01/06/2019- still active Deductible: does not have OOP Max: $4,000 ($1,439.67 met) CIR: $160/day co-pay with a max co-pay of $1,600/admission (10 days) SNF: 100% coverage Outpatient:  $20/visit co-pay Home Health:  100% coverage DME: 80% coverage; 20% co-insurance Providers: in network '  SECONDARY:       Policy#:      Phone#:   The "Data Collection Information Summary" for patients in Inpatient Rehabilitation Facilities with attached "Privacy Act [place] Records" was provided and verbally reviewed with: Patient and Family  Emergency Contact Information Contact Information     Name Relation Home Work [place] Daughter [phone]  [phone]   [name],[name] Daughter   [phone]   [name] Daughter   [phone]       Current Medical History  Patient Admitting Diagnosis: Debility, Failure to thrive   History of Present Illness: 85 y.o. female with a history of depression who presented to the ED with siucidal ideation in the setting of continued functional decline, debility associated with tremors, bradykinesia.  She was initially started on Rocephin for possible UTI but subsequently stopped.  Psychiatry initially recommended inpatient psychiatric hospitalization but subsequently patient was no longer suicidal and hence psychiatry recommended outpatient follow-up.  Neurology was also consulted for possible movement disorder.hospital.  MRI of the brain  brain showed moderate microvascular ischemic disease, mild to moderate atrophy, no acute abnormalities Neurology following due to Several overlapping clinical features between Parkinsonian disorder and severe depression/pseudodementia.  PT/OT evaluations were completed with recommendations for post acute rehab admission due to a functional decline.  Will admit today to acute inpatient rehab program.  Patient's medical record from [facility] has been reviewed by the rehabilitation admission coordinator and physician.  Past Medical History  Past Medical History:  Diagnosis Date   Hot flashes 06/06/2020   Hypertension     Family History   family history includes Hypertension in an other family member.  Prior Rehab/Hospitalizations Has the patient had prior rehab or hospitalizations prior to admission? Yes  Has the patient had major surgery during 100 days prior to admission? No   Current Medications  Current Facility-Administered Medications:    acetaminophen (TYLENOL) tablet 650 mg, 650 mg, Oral, Q6H PRN, 650 mg at 07/30/20 0908 **OR** acetaminophen (TYLENOL) suppository 650 mg, 650 mg, Rectal, Q6H PRN, Doutova, Anastassia, MD   bimatoprost (LUMIGAN) 0.01 % ophthalmic solution 1 drop, 1 drop, Left Eye, QHS, Adhikari, Amrit, MD   brinzolamide (AZOPT) 1 % ophthalmic suspension 1 drop, 1 drop, Left Eye, BID, Doutova, Anastassia, MD, 1 drop at 08/02/20 0945   cholecalciferol (VITAMIN D) tablet 1,000 Units, 1,000 Units, Oral, Daily, Adhikari, Amrit, MD, 1,000 Units at 08/02/20 1202   enoxaparin (LOVENOX) injection 40 mg, 40 mg, Subcutaneous, Q24H, Alekh, Kshitiz, MD, 40 mg at 08/01/20 1223   feeding supplement (ENSURE ENLIVE /  ENSURE PLUS) liquid 237 mL, 237 mL, Oral, TID BM, Thurnell Lose, MD, 237 mL at 08/02/20 0955   FLUoxetine (PROZAC) capsule 20 mg, 20 mg, Oral, Daily, France Ravens, MD, 20 mg at 08/02/20 0944   hydrOXYzine (ATARAX/VISTARIL) tablet 25 mg, 25 mg, Oral,  TID PRN, Damita Dunnings B, MD   megestrol (MEGACE) 400 MG/10ML suspension 400 mg, 400 mg, Oral, Daily, Alekh, Kshitiz, MD, 400 mg at 08/02/20 0944   multivitamin with minerals tablet 1 tablet, 1 tablet, Oral, Daily, Alekh, Kshitiz, MD, 1 tablet at 08/02/20 0944   polyethylene glycol (MIRALAX / GLYCOLAX) packet 17 g, 17 g, Oral, Daily PRN, Debbe Odea, MD, 17 g at 07/29/20 1232   simvastatin (ZOCOR) tablet 20 mg, 20 mg, Oral, Daily, Doutova, Anastassia, MD, 20 mg at 08/02/20 0944   traZODone (DESYREL) tablet 25 mg, 25 mg, Oral, QHS, Doutova, Anastassia, MD, 25 mg at 08/01/20 2154   venlafaxine XR (EFFEXOR-XR) 24 hr capsule 37.5 mg, 37.5 mg, Oral, Q breakfast, Akintayo, Mojeed, MD, 37.5 mg at 08/02/20 0944  Patients Current Diet:  Diet Order             Diet regular Room service appropriate? Yes; Fluid consistency: Thin  Diet effective now                   Precautions / Restrictions Precautions Precautions: Fall Precaution Comments: tends to lean posteriorly Restrictions Weight Bearing Restrictions: No   Has the patient had 2 or more falls or a fall with injury in the past year? Yes  Prior Activity Level Limited Community (1-2x/wk): for appointments  Prior Functional Level Self Care: Did the patient need help bathing, dressing, using the toilet or eating? Needed some help  Indoor Mobility: Did the patient need assistance with walking from room to room (with or without device)? Needed some help  Stairs: Did the patient need assistance with internal or external stairs (with or without device)? Needed some help  Functional Cognition: Did the patient need help planning regular tasks such as shopping or remembering to take medications? Needed some help  Home Assistive Devices / [place] Devices/Equipment: Gilford Rile (specify type)  Prior Device Use: Indicate devices/aids used by the patient prior to current illness, exacerbation or injury? Walker  Current  Functional Level Cognition  Overall Cognitive Status: Impaired/Different from baseline Current Attention Level:  (Internally distracted by fear of falling) Orientation Level: Oriented X4 Following Commands: Follows one step commands with increased time Safety/Judgement: Decreased awareness of safety, Decreased awareness of deficits General Comments: Needs repetition    Extremity Assessment (includes Sensation/Coordination)  Upper Extremity Assessment: Generalized weakness  Lower Extremity Assessment: Defer to PT evaluation    ADLs  Overall ADL's : Needs assistance/impaired Grooming: Moderate assistance, Standing, Wash/dry face, Brushing hair Grooming Details (indicate cue type and reason): Mod A external assist for standing balance. Pt requiring verbal cues for upright posture initially. Upper Body Bathing: Supervision/ safety, Sitting Lower Body Bathing: Maximal assistance, Sitting/lateral leans, Sit to/from stand Upper Body Dressing : Supervision/safety, Sitting, Bed level Lower Body Dressing: Maximal assistance Lower Body Dressing Details (indicate cue type and reason): Max A for guiding toes into shoe and tying shoes. Toilet Transfer: Moderate assistance, RW Toilet Transfer Details (indicate cue type and reason): simulated toilet transfer this date, back and forth recliner to bed. Toileting- Clothing Manipulation and Hygiene: Total assistance, Sit to/from stand Toileting - Clothing Manipulation Details (indicate cue type and reason): Total A for pericare and clothing management Functional mobility  during ADLs: Moderate assistance, +2 for physical assistance, +2 for safety/equipment (Heavy mod A +2 for stand pivot transfers; mod A for sit<>stand) General ADL Comments: spent time working on scooting to the edge, leaning forward and standing.  stood times 4 with RW with increased time for bringing hips forward and shifting weight forward.    Mobility  Overal bed mobility: Needs  Assistance Bed Mobility: Sit to Supine Rolling: Max assist Sidelying to sit: Max assist Supine to sit: Min assist, HOB elevated Sit to supine: Min assist General bed mobility comments: Max cues to scoot to EOB. Min A hand held assist for raising trunk    Transfers  Overall transfer level: Needs assistance Equipment used: Rolling walker (2 wheeled) Transfer via Lift Equipment: Stedy Transfers: Sit to/from Stand, Risk manager Sit to Stand: Min assist, Mod assist Stand pivot transfers: Min assist, Mod assist General transfer comment: Pt wanting to practice multiple sit to stands and stand pivots.  Performed 4 stand pivots from bed to chair and back and 1 stand pivot from bed to bsc and back (10 total) and 18 stands total.  To stand: cues for hand placement, to lean forward, and to tuck buttock and pull back shoulder to complete stand.  Cues for RW and to keep buttock tucked for stand pivot.  To sit: cued to always turn all the way until both legs touch surface and reach back.  Pt initially mod A progressing to min A but with increased time to rise.    Ambulation / Gait / Stairs / Wheelchair Mobility  Ambulation/Gait Ambulation/Gait assistance: Min assist, +2 safety/equipment Gait Distance (Feet): 22 Feet (22'x2) Assistive device: Rolling walker (2 wheeled) Gait Pattern/deviations: Step-to pattern, Decreased stance time - left, Decreased weight shift to left, Shuffle General Gait Details: Pt with shuffle almost festinating pattern.  Cued to slow and take bigger steps with assist to weight shift.  Had chair follow.  22'x2 Gait velocity: decr Gait velocity interpretation: <1.8 ft/sec, indicate of risk for recurrent falls    Posture / Balance Dynamic Sitting Balance Sitting balance - Comments: Leaning posteriorly, min guard. Balance Overall balance assessment: Needs assistance Sitting-balance support: Feet supported, No upper extremity supported Sitting balance-Leahy Scale:  Good Sitting balance - Comments: Leaning posteriorly, min guard. Postural control: Posterior lean Standing balance support: Bilateral upper extremity supported, During functional activity Standing balance-Leahy Scale: Poor Standing balance comment: Required RW.  On initial stand pt pushing legs into bed, leaning posteriorly at hips requiring tactile cues at pelvis and verbal cues for upright posture. Requiring mod A initially progressing to min guard- light min A    Special needs/care consideration Psych consult is pending from acute.  Currently has a private pay sitter through the family until 7 am 08/03/20.   Previous Home Environment (from acute therapy documentation) Living Arrangements: Children  Lives With: Daughter Available Help at Discharge: Family, Friend(s), Available PRN/intermittently Type of Home: House Home Layout: Two level, Able to live on main level with bedroom/bathroom Home Access: Stairs to enter Entrance Stairs-Rails: None Entrance Stairs-Number of Steps: 3 Bathroom Shower/Tub: Chiropodist: Handicapped height Bathroom Accessibility: Yes How Accessible: Accessible via walker [place]: No (had [place] PT/OT in February 2022) Additional Comments: has been living with her dtr since pelvic fx ~ 8-9 mos ago  Discharge Living Setting Plans for Discharge Living Setting: House (daughter's house) Type of Home at Discharge: House Discharge Home Layout: Two level, Able to live on main level with bedroom/bathroom Discharge  Home Access: Stairs to enter Entrance Stairs-Rails: None Entrance Stairs-Number of Steps: 3 Discharge Bathroom Shower/Tub: Tub/shower unit Discharge Bathroom Toilet: Handicapped height Discharge Bathroom Accessibility: Yes How Accessible: Accessible via walker Does the patient have any problems obtaining your medications?: No  Social/Family/Support Systems Anticipated Caregiver: [place] Sit, daughter Anticipated Caregiver's  Contact Information: [phone] Caregiver Availability: Confirmed family will provide 24/7 supervision upon discharge Discharge Plan Discussed with Primary Caregiver: Yes Is Caregiver In Agreement with Plan?: Yes Does Caregiver/Family have Issues with Lodging/Transportation while Pt is in Rehab?: No  Goals Patient/Family Goal for Rehab: PT/OT supervision to Min A goals Expected length of stay: 10-14 days Pt/Family Agrees to Admission and willing to participate: Yes Program Orientation Provided & Reviewed with Pt/Caregiver Including Roles  & Responsibilities: Yes  Decrease burden of Care through IP rehab admission: Specialzed equipment needs, Decrease number of caregivers, Bowel and bladder program, and Patient/family education  Possible need for SNF placement upon discharge: not anticipated   Patient Condition: I have reviewed medical records from [facility], spoken with CM, and patient and daughter. I met with patient at the bedside for inpatient rehabilitation assessment.  Patient will benefit from ongoing PT, OT, and SLP, can actively participate in 3 hours of therapy a day 5 days of the week, and can make measurable gains during the admission.  Patient will also benefit from the coordinated team approach during an Inpatient Acute Rehabilitation admission.  The patient will receive intensive therapy as well as Rehabilitation physician, nursing, social worker, and care management interventions.  Due to safety, disease management, medication administration, pain management, and patient education the patient requires 24 hour a day rehabilitation nursing.  The patient is currently min to mod assist with mobility and basic ADLs.  Discharge setting and therapy post discharge at home with home health is anticipated.  Patient has agreed to participate in the Acute Inpatient Rehabilitation Program and will admit today.  Preadmission Screen Completed By:  Retta Diones, 08/02/2020  12:12 PM ______________________________________________________________________   Discussed status with Dr. Naaman Plummer on 08/02/20 at 94 and received approval for admission today.  Admission Coordinator:  Retta Diones, RN, time 1210/Date 08/02/20   Assessment/Plan: Diagnosis: debility and FTT related severe psychiatric disorder, medication SE's Does the need for close, 24 hr/day Medical supervision in concert with the patient's rehab needs make it unreasonable for this patient to be served in a less intensive setting? Yes Co-Morbidities requiring supervision/potential complications: severe epression Due to bladder management, bowel management, safety, skin/wound care, disease management, medication administration, pain management, and patient education, does the patient require 24 hr/day rehab nursing? Yes Does the patient require coordinated care of a physician, rehab nurse, PT, OT to address physical and functional deficits in the context of the above medical diagnosis(es)? Yes Addressing deficits in the following areas: balance, endurance, locomotion, strength, transferring, bowel/bladder control, bathing, dressing, feeding, grooming, toileting, and psychosocial support Can the patient actively participate in an intensive therapy program of at least 3 hrs of therapy 5 days a week? Yes The potential for patient to make measurable gains while on inpatient rehab is excellent Anticipated functional outcomes upon discharge from inpatient rehab: supervision PT, supervision and min assist OT, n/a SLP Estimated rehab length of stay to reach the above functional goals is: 10-14 days Anticipated discharge destination: Home 10. Overall Rehab/Functional Prognosis: excellent   MD Signature: Meredith Staggers, MD, [facility] 08/02/2020

## 2020-07-24 DIAGNOSIS — F332 Major depressive disorder, recurrent severe without psychotic features: Secondary | ICD-10-CM | POA: Diagnosis not present

## 2020-07-24 LAB — COMPREHENSIVE METABOLIC PANEL
ALT: 13 U/L (ref 0–44)
AST: 14 U/L — ABNORMAL LOW (ref 15–41)
Albumin: 2.8 g/dL — ABNORMAL LOW (ref 3.5–5.0)
Alkaline Phosphatase: 42 U/L (ref 38–126)
Anion gap: 6 (ref 5–15)
BUN: 12 mg/dL (ref 8–23)
CO2: 21 mmol/L — ABNORMAL LOW (ref 22–32)
Calcium: 9 mg/dL (ref 8.9–10.3)
Chloride: 107 mmol/L (ref 98–111)
Creatinine, Ser: 0.64 mg/dL (ref 0.44–1.00)
GFR, Estimated: >60 mL/min (ref >60)
Glucose, Bld: 101 mg/dL — ABNORMAL HIGH (ref 70–99)
Potassium: 3.9 mmol/L (ref 3.5–5.1)
Sodium: 134 mmol/L — ABNORMAL LOW (ref 135–145)
Total Bilirubin: 0.5 mg/dL (ref 0.3–1.2)
Total Protein: 5.7 g/dL — ABNORMAL LOW (ref 6.5–8.1)

## 2020-07-24 LAB — CBC WITH DIFFERENTIAL/PLATELET
Abs Immature Granulocytes: 0.03 10*3/uL (ref 0.00–0.07)
Basophils Absolute: 0 10*3/uL (ref 0.0–0.1)
Basophils Relative: 1 %
Eosinophils Absolute: 0.1 10*3/uL (ref 0.0–0.5)
Eosinophils Relative: 2 %
HCT: 33.3 % — ABNORMAL LOW (ref 36.0–46.0)
Hemoglobin: 10.8 g/dL — ABNORMAL LOW (ref 12.0–15.0)
Immature Granulocytes: 1 %
Lymphocytes Relative: 30 %
Lymphs Abs: 2 10*3/uL (ref 0.7–4.0)
MCH: 28.6 pg (ref 26.0–34.0)
MCHC: 32.4 g/dL (ref 30.0–36.0)
MCV: 88.3 fL (ref 80.0–100.0)
Monocytes Absolute: 0.6 10*3/uL (ref 0.1–1.0)
Monocytes Relative: 9 %
Neutro Abs: 3.8 10*3/uL (ref 1.7–7.7)
Neutrophils Relative %: 57 %
Platelets: 296 10*3/uL (ref 150–400)
RBC: 3.77 MIL/uL — ABNORMAL LOW (ref 3.87–5.11)
RDW: 14.6 % (ref 11.5–15.5)
WBC: 6.6 10*3/uL (ref 4.0–10.5)
nRBC: 0 % (ref 0.0–0.2)

## 2020-07-24 LAB — BRAIN NATRIURETIC PEPTIDE: B Natriuretic Peptide: 98.4 pg/mL (ref 0.0–100.0)

## 2020-07-24 LAB — MAGNESIUM: Magnesium: 2 mg/dL (ref 1.7–2.4)

## 2020-07-24 MED ORDER — FLUOXETINE HCL 20 MG PO CAPS
20.0000 mg | ORAL_CAPSULE | Freq: Every day | ORAL | Status: DC
Start: 2020-07-28 — End: 2020-08-02
  Administered 2020-07-28 – 2020-08-02 (×6): 20 mg via ORAL
  Filled 2020-07-24 (×6): qty 1

## 2020-07-24 NOTE — Plan of Care (Signed)

## 2020-07-24 NOTE — [place] FL2 (Signed)
[place] MEDICAID [id number] LEVEL OF CARE SCREENING TOOL     IDENTIFICATION  Patient Name: Sheila Andrade Birthdate: [birth year] Sex: female Admission Date (Current Location): 07/14/2020  [facility] and [place] Number:  Producer, television/film/video and Address:  [facility], [address] [place], [place] [address]      Provider Number: [id number]  Attending Physician Name and Address:  Leroy Sea, MD  Relative Name and Phone Number:  Elyanah, Farino (Daughter)   [phone]    Current Level of Care: Hospital Recommended Level of Care: Skilled Nursing Facility Prior Approval Number:    Date Approved/Denied:   PASRR Number: [id number]  Discharge Plan: SNF    Current Diagnoses: Patient Active Problem List   Diagnosis Date Noted   Dehydration 07/16/2020   Hyponatremia 07/15/2020   Suicidal ideations 07/15/2020   MDD (major depressive disorder), recurrent episode, severe (HCC) 07/15/2020   UTI (urinary tract infection) 07/14/2020   Hot flashes 06/06/2020   Fever 05/27/2020   Weight loss 05/27/2020   Tinea corporis 05/27/2020   Anemia 05/27/2020    Orientation RESPIRATION BLADDER Height & Weight     Self, Time, Situation, Place  Normal Continent Weight: 51.3 kg Height:  5\' 7"  (170.2 cm)  BEHAVIORAL SYMPTOMS/MOOD NEUROLOGICAL BOWEL NUTRITION STATUS      Continent Diet (Regular with thin liquids)  AMBULATORY STATUS COMMUNICATION OF NEEDS Skin   Extensive Assist Verbally Normal                       Personal Care Assistance Level of Assistance  Bathing, Feeding, Dressing Bathing Assistance: Maximum assistance Feeding assistance: Limited assistance Dressing Assistance: Maximum assistance     Functional Limitations Info  Sight, Hearing, Speech Sight Info: Impaired Hearing Info: Adequate Speech Info: Adequate    SPECIAL CARE FACTORS FREQUENCY  PT (By licensed PT), OT (By licensed OT)     PT Frequency: 5x/wk OT Frequency: 5x/wk             Contractures Contractures Info: Not present    Additional Factors Info  Code Status, Allergies, Psychotropic Code Status Info: full Allergies Info: NKA Psychotropic Info: Prozac 10 mg daily/ trazadone 25 mg at bedtime/ Effexor XR 75 mg every am         Current Medications (07/24/2020):  This is the current hospital active medication list Current Facility-Administered Medications  Medication Dose Route Frequency Provider Last Rate Last Admin   acetaminophen (TYLENOL) tablet 650 mg  650 mg Oral Q6H PRN 07/26/2020, MD   650 mg at 07/18/20 1112   Or   acetaminophen (TYLENOL) suppository 650 mg  650 mg Rectal Q6H PRN Doutova, Anastassia, MD       brinzolamide (AZOPT) 1 % ophthalmic suspension 1 drop  1 drop Left Eye BID Doutova, Anastassia, MD   1 drop at 07/24/20 1057   cyanocobalamin ((VITAMIN B-12)) injection 1,000 mcg  1,000 mcg Intramuscular Q0600 07/26/20, MD   1,000 mcg at 07/24/20 0404   enoxaparin (LOVENOX) injection 40 mg  40 mg Subcutaneous Q24H Alekh, Kshitiz, MD   40 mg at 07/24/20 1359   feeding supplement (ENSURE ENLIVE / ENSURE PLUS) liquid 237 mL  237 mL Oral BID BM Doutova, Anastassia, MD   237 mL at 07/24/20 1400   FLUoxetine (PROZAC) capsule 10 mg  10 mg Oral Daily 07/26/20, MD   10 mg at 07/24/20 1055   [START ON 07/28/2020] FLUoxetine (PROZAC) capsule 20 mg  20 mg  Oral Daily Park Pope, MD       hydrOXYzine (ATARAX/VISTARIL) tablet 25 mg  25 mg Oral TID PRN Eliseo Gum B, MD       latanoprost (XALATAN) 0.005 % ophthalmic solution 1 drop  1 drop Left Eye QHS Therisa Doyne, MD   1 drop at 07/23/20 2046   megestrol (MEGACE) 400 MG/10ML suspension 400 mg  400 mg Oral Daily Glade Lloyd, MD   400 mg at 07/24/20 1056   multivitamin with minerals tablet 1 tablet  1 tablet Oral Daily Glade Lloyd, MD   1 tablet at 07/24/20 1056   polyethylene glycol (MIRALAX / GLYCOLAX) packet 17 g  17 g Oral Daily PRN Calvert Cantor, MD   17 g at 07/19/20 1058    simvastatin (ZOCOR) tablet 20 mg  20 mg Oral Daily Doutova, Anastassia, MD   20 mg at 07/24/20 1056   traZODone (DESYREL) tablet 25 mg  25 mg Oral QHS Doutova, Anastassia, MD   25 mg at 07/23/20 2047   [START ON 07/28/2020] venlafaxine XR (EFFEXOR-XR) 24 hr capsule 37.5 mg  37.5 mg Oral Q breakfast Akintayo, Mojeed, MD       venlafaxine XR (EFFEXOR-XR) 24 hr capsule 75 mg  75 mg Oral Q breakfast Akintayo, Mojeed, MD   75 mg at 07/24/20 0840     Discharge Medications: Please see discharge summary for a list of discharge medications.  Relevant Imaging Results:  Relevant Lab Results:   Additional Information [phone]  Kermit Balo, [place]

## 2020-07-24 NOTE — Progress Notes (Signed)
PROGRESS NOTE                                                                                                                                                                                                             Patient Demographics:    Sheila Andrade, is a 85 y.o. female, DOB - [birth year], MVH:[id number]  Outpatient Primary MD for the patient is Dalphine Handing, MD    LOS - 8  Admit date - 07/14/2020    Chief Complaint  Patient presents with   Failure To Thrive   Fatigue       Brief Narrative (HPI from H&P)  - 85 year old female with history of depression, ongoing functional decline, fall with history of pelvic fracture, recent development of tremors and bradykinesia who was brought in for suicidal ideation, decreased oral intake and ongoing generalized deconditioning, was seen by psych and neurology also as she had ongoing functional decline with bradykinesia.  She was admitted to [facility] long hospital thereafter transferred to [facility] on 07/22/2020 for continued neurology follow-up.   Subjective:   Patient in bed, appears comfortable, denies any headache, no fever, no chest pain or pressure, no shortness of breath , no abdominal pain. No new focal weakness. Eating better.   Assessment  & Plan :     Severe depression and suicidal ideation.  Seen by psych psych is following.  Currently not actively suicidal or homicidal, still mildly anxious, on Effexor and trazodone and will defer all psych issues to psychiatry directly, have requested them to follow on a daily basis.  2.  Failure to thrive and ongoing functional decline with decreased oral intake.  Supportive care, psych treatment as above.  Continue oral protein supplementation, oral intake has improved, she will require SNF/CIR depending on insurance approval.  3.  Poor balance, bradykinesia, hyperreflexia in both lower extremities.  MRI brain, C and L-spine  unremarkable, neurology is following, this discussed with neurologist on 07/23/2020 outpatient neurology work-up with continued PT OT and placement.  4.  Vitamin B12 deficiency.  Placed on replacement.  5.  Mild hyponatremia.  Improving with IV fluids.  6.  Moderate to severe protein calorie malnutrition.  On supplements.  7.  Abnormal UA.  UTI ruled out clinically.  Cultures.      Condition -fair  Family Communication  : Message left  for patient's daughter DC through patient's phone on 07/22/2020, daughter updated bedside on 07/23/2020  Corrie Dandy [phone] - 07/24/20   Code Status : Full  Consults  : Psychiatry, neurology  PUD Prophylaxis : None   Procedures  :     MRI brain C&T Spine.  Nonacute.      Disposition Plan  :    Status is: Inpatient  Remains inpatient appropriate because:IV treatments appropriate due to intensity of illness or inability to take PO  Dispo: The patient is from: Home              Anticipated d/c is to: SNF              Patient currently is not medically stable to d/c.   Difficult to place patient No  DVT Prophylaxis  :    enoxaparin (LOVENOX) injection 40 mg Start: 07/21/20 1200 SCDs Start: 07/15/20 0036   Lab Results  Component Value Date   PLT 296 07/24/2020    Diet :  Diet Order             Diet regular Room service appropriate? Yes; Fluid consistency: Thin  Diet effective now                    Inpatient Medications  Scheduled Meds:  brinzolamide  1 drop Left Eye BID   cyanocobalamin  1,000 mcg Intramuscular Q0600   enoxaparin (LOVENOX) injection  40 mg Subcutaneous Q24H   feeding supplement  237 mL Oral BID BM   FLUoxetine  10 mg Oral Daily   [START ON 07/28/2020] FLUoxetine  20 mg Oral Daily   latanoprost  1 drop Left Eye QHS   megestrol  400 mg Oral Daily   multivitamin with minerals  1 tablet Oral Daily   simvastatin  20 mg Oral Daily   traZODone  25 mg Oral QHS   [START ON 07/28/2020] venlafaxine XR  37.5 mg  Oral Q breakfast   venlafaxine XR  75 mg Oral Q breakfast   Continuous Infusions:   PRN Meds:.acetaminophen **OR** acetaminophen, hydrOXYzine, polyethylene glycol  Antibiotics  :    Anti-infectives (From admission, onward)    Start     Dose/Rate Route Frequency Ordered Stop   07/15/20 2000  cefTRIAXone (ROCEPHIN) 1 g in sodium chloride 0.9 % 100 mL IVPB  Status:  Discontinued        1 g 200 mL/hr over 30 Minutes Intravenous Every 24 hours 07/15/20 0000 07/15/20 1635   07/14/20 2315  cefTRIAXone (ROCEPHIN) 1 g in sodium chloride 0.9 % 100 mL IVPB        1 g 200 mL/hr over 30 Minutes Intravenous  Once 07/14/20 2307 07/15/20 0040        Time Spent in minutes  30   Susa Raring M.D on 07/24/2020 at 12:48 PM  To page go to [email or web address]   [facility] -  Office  [phone]  See all Orders from today for further details    Objective:   Vitals:   07/23/20 2352 07/24/20 0314 07/24/20 0751 07/24/20 1157  BP: (!) 153/80 (!) 123/53 (!) 135/93 115/72  Pulse: 87 76 83 83  Resp: 18 16 16 16   Temp: 98.4 F (36.9 C) 97.8 F (36.6 C) 97.7 F (36.5 C) 98.5 F (36.9 C)  TempSrc: Oral Oral Oral Oral  SpO2: 98% 99% 98% 98%  Weight:      Height:        Wt Readings from Last  3 Encounters:  07/14/20 51.3 kg  05/31/20 54.4 kg  05/27/20 52.8 kg     Intake/Output Summary (Last 24 hours) at 07/24/2020 1248 Last data filed at 07/24/2020 0753 Gross per 24 hour  Intake 360.52 ml  Output 830 ml  Net -469.48 ml      Physical Exam  Mildly somnolent but waking up, alert x 2, bilateral lower extremity strength 4/5 with increased tone and hyperreflexia, normal affect [place].AT,PERRAL Supple Neck,No JVD, No cervical lymphadenopathy appriciated.  Symmetrical Chest wall movement, Good air movement bilaterally, CTAB RRR,No Gallops, Rubs or new Murmurs, No Parasternal Heave +ve B.Sounds, Abd Soft, No tenderness, No organomegaly appriciated, No rebound - guarding or rigidity. No  Cyanosis, Clubbing or edema,     Data Review:    CBC Recent Labs  Lab 07/18/20 0457 07/21/20 0544 07/22/20 0205 07/23/20 0337 07/24/20 0403  WBC 6.9 6.0 6.4 6.3 6.6  HGB 12.7 12.6 10.3* 10.1* 10.8*  HCT 39.6 40.1 32.4* 30.9* 33.3*  PLT 315 286 297 291 296  MCV 89.6 89.5 89.0 88.3 88.3  MCH 28.7 28.1 28.3 28.9 28.6  MCHC 32.1 31.4 31.8 32.7 32.4  RDW 14.5 14.3 14.4 14.7 14.6  LYMPHSABS 1.8 1.6 1.6 2.0 2.0  MONOABS 0.8 0.6 0.6 0.6 0.6  EOSABS 0.2 0.1 0.1 0.1 0.1  BASOSABS 0.1 0.1 0.1 0.0 0.0    Recent Labs  Lab 07/20/20 0538 07/21/20 0544 07/22/20 0205 07/22/20 1127 07/23/20 0337 07/24/20 0403  NA 133* 134* 135  --  137 134*  K 4.0 3.8 3.6  --  3.8 3.9  CL 99 101 107  --  107 107  CO2 27 26 22   --  22 21*  GLUCOSE 94 116* 146*  --  112* 101*  BUN 16 11 19   --  16 12  CREATININE 0.66 0.67 0.63  --  0.60 0.64  CALCIUM 9.4 9.1 8.9  --  8.5* 9.0  AST  --  19 13*  --  13* 14*  ALT  --  13 12  --  11 13  ALKPHOS  --  60 50  --  42 42  BILITOT  --  0.3 0.3  --  0.4 0.5  ALBUMIN  --  3.4* 2.8*  --  2.7* 2.8*  MG 2.1 2.0 1.9  --  2.0 2.0  TSH  --   --   --  1.380  --   --   BNP  --   --   --   --  94.5 98.4    ------------------------------------------------------------------------------------------------------------------ No results for input(s): CHOL, HDL, LDLCALC, TRIG, CHOLHDL, LDLDIRECT in the last 72 hours.  No results found for: HGBA1C ------------------------------------------------------------------------------------------------------------------ Recent Labs    07/22/20 1127  TSH 1.380    Cardiac Enzymes No results for input(s): CKMB, TROPONINI, MYOGLOBIN in the last 168 hours.  Invalid input(s): CK ------------------------------------------------------------------------------------------------------------------    Component Value Date/Time   BNP 98.4 07/24/2020 0403     Radiology Reports DG Chest 2 View  Result Date: 07/14/2020 CLINICAL  DATA:  Failure to thrive, weight loss EXAM: CHEST - 2 VIEW COMPARISON:  Radiograph 05/31/2020 FINDINGS: Suspect a small calcified mediastinal lymph node seen on lateral radiograph. Remaining cardiomediastinal contours are unremarkable. Bandlike opacity in the right lung base, likely scarring given similar to prior. No consolidation, features of edema, pneumothorax, or effusion. Degenerative changes are present in the imaged spine and shoulders. Evidence of calcific tendinosis in the left shoulder. Left cervical carotid atherosclerosis. IMPRESSION: Suspect a  small calcified mediastinal/hilar node. Can reflect sequela of prior granulomatous disease. No acute cardiopulmonary abnormality. Electronically Signed   By: Kreg Shropshire M.D.   On: 07/14/2020 20:56   MR BRAIN WO CONTRAST  Result Date: 07/14/2020 CLINICAL DATA:  Mental status change. EXAM: MRI HEAD WITHOUT CONTRAST TECHNIQUE: Multiplanar, multiecho pulse sequences of the brain and surrounding structures were obtained without intravenous contrast. COMPARISON:  None. FINDINGS: Brain: No acute infarction, acute hemorrhage, hydrocephalus, extra-axial collection or mass lesion. Moderate scattered T2/FLAIR hyperintensities within the white matter, which are nonspecific but most likely related to chronic microvascular ischemic disease given patient age. Mild-to-moderate atrophy with ex vacuo ventricular dilation. The callosal angle is not acute and there is no crowding of sulci at the vertex. Dilated perivascular space in the inferior left basal ganglia. Small focus of susceptibility artifact in the lateral left cerebellum, most likely a prior microhemorrhage. Vascular: Major arterial flow voids are maintained skull base. Skull and upper cervical spine: Normal marrow signal. Sinuses/Orbits: Visualized sinuses are clear. No acute orbital findings. Other: Small left mastoid effusion. IMPRESSION: 1. No evidence of acute intracranial abnormality 2. Moderate chronic  microvascular ischemic disease and mild-to-moderate atrophy. 3. Small left mastoid effusion. Electronically Signed   By: Feliberto Harts MD   On: 07/14/2020 20:11   MR CERVICAL SPINE W WO CONTRAST  Result Date: 07/22/2020 CLINICAL DATA:  Hyper-reflexia and abnormal gait EXAM: MRI CERVICAL AND THORACIC SPINE WITHOUT AND WITH CONTRAST TECHNIQUE: Multiplanar and multiecho pulse sequences of the cervical spine, to include the craniocervical junction and cervicothoracic junction, and the thoracic spine, were obtained without and with intravenous contrast. CONTRAST:  9mL GADAVIST GADOBUTROL 1 MMOL/ML IV SOLN COMPARISON:  None. FINDINGS: MRI CERVICAL SPINE FINDINGS Alignment: Physiologic. Vertebrae: No fracture, evidence of discitis, or bone lesion. Cord: Normal signal and morphology. Posterior Fossa, vertebral arteries, paraspinal tissues: Negative. Disc levels: Axial images are severely motion degraded. C1-2: Unremarkable. C2-3: Normal disc space and facet joints. There is no spinal canal stenosis. No neural foraminal stenosis. C3-4: Normal disc space and facet joints. There is no spinal canal stenosis. No neural foraminal stenosis. C4-5: Normal disc space and facet joints. There is no spinal canal stenosis. No neural foraminal stenosis. C5-6: Small left asymmetric disc bulge. There is no spinal canal stenosis. Mild left neural foraminal stenosis. C6-7: Left asymmetric disc bulge with uncovertebral spurring. There is no spinal canal stenosis. Moderate left neural foraminal stenosis. C7-T1: Normal disc space and facet joints. There is no spinal canal stenosis. No neural foraminal stenosis. MRI THORACIC SPINE FINDINGS Alignment:  Physiologic. Vertebrae: No fracture, evidence of discitis, or bone lesion. Cord:  Normal signal and morphology. Paraspinal and other soft tissues: Negative. Disc levels: Severely motion degraded axial images. No spinal canal stenosis. There is a small central disc protrusion noted at T7-8.  No abnormal contrast enhancement. IMPRESSION: 1. Motion degraded study. 2. No acute abnormality of the cervical or thoracic spine. 3. Moderate left C6-7 and mild left C5-6 neural foraminal stenosis. 4. No spinal canal stenosis. Electronically Signed   By: Deatra Robinson M.D.   On: 07/22/2020 21:07   MR THORACIC SPINE W WO CONTRAST  Result Date: 07/22/2020 CLINICAL DATA:  Hyper-reflexia and abnormal gait EXAM: MRI CERVICAL AND THORACIC SPINE WITHOUT AND WITH CONTRAST TECHNIQUE: Multiplanar and multiecho pulse sequences of the cervical spine, to include the craniocervical junction and cervicothoracic junction, and the thoracic spine, were obtained without and with intravenous contrast. CONTRAST:  73mL GADAVIST GADOBUTROL 1 MMOL/ML IV SOLN COMPARISON:  None. FINDINGS: MRI CERVICAL SPINE FINDINGS Alignment: Physiologic. Vertebrae: No fracture, evidence of discitis, or bone lesion. Cord: Normal signal and morphology. Posterior Fossa, vertebral arteries, paraspinal tissues: Negative. Disc levels: Axial images are severely motion degraded. C1-2: Unremarkable. C2-3: Normal disc space and facet joints. There is no spinal canal stenosis. No neural foraminal stenosis. C3-4: Normal disc space and facet joints. There is no spinal canal stenosis. No neural foraminal stenosis. C4-5: Normal disc space and facet joints. There is no spinal canal stenosis. No neural foraminal stenosis. C5-6: Small left asymmetric disc bulge. There is no spinal canal stenosis. Mild left neural foraminal stenosis. C6-7: Left asymmetric disc bulge with uncovertebral spurring. There is no spinal canal stenosis. Moderate left neural foraminal stenosis. C7-T1: Normal disc space and facet joints. There is no spinal canal stenosis. No neural foraminal stenosis. MRI THORACIC SPINE FINDINGS Alignment:  Physiologic. Vertebrae: No fracture, evidence of discitis, or bone lesion. Cord:  Normal signal and morphology. Paraspinal and other soft tissues: Negative.  Disc levels: Severely motion degraded axial images. No spinal canal stenosis. There is a small central disc protrusion noted at T7-8. No abnormal contrast enhancement. IMPRESSION: 1. Motion degraded study. 2. No acute abnormality of the cervical or thoracic spine. 3. Moderate left C6-7 and mild left C5-6 neural foraminal stenosis. 4. No spinal canal stenosis. Electronically Signed   By: Deatra Robinson M.D.   On: 07/22/2020 21:07   DG Hip Unilat W or Wo Pelvis 2-3 Views Left  Result Date: 07/14/2020 CLINICAL DATA:  Failure to thrive and weight loss EXAM: DG HIP (WITH OR WITHOUT PELVIS) 2-3V LEFT COMPARISON:  01/01/2020 FINDINGS: Pelvic ring is intact. Old healed pubic rami fractures are noted on the left stable from prior CT examination. Degenerative changes of the hip joints are noted right greater than left. No acute fracture or dislocation in the left hip is noted. Some widening of the left sacroiliac joint is noted which appears more prominent than that seen on prior CT. IMPRESSION: Slight widening of the left sacroiliac joint which may be projectional in nature. Degenerative changes of the hip joints bilaterally right greater than left. No acute fracture is seen. Old fractures in the left pubic rami are seen. Electronically Signed   By: Alcide Clever M.D.   On: 07/14/2020 20:56

## 2020-07-24 NOTE — Progress Notes (Signed)
Nutrition Follow-up  DOCUMENTATION CODES:   Severe malnutrition in context of chronic illness  INTERVENTION:   Please obtain updated weight  -Increase to Ensure Enlive po TID, each supplement provides 350 kcal and 20 grams of protein -Continue Multivitamin with minerals daily  NUTRITION DIAGNOSIS:   Severe Malnutrition related to chronic illness (depression and SI) as evidenced by severe fat depletion, severe muscle depletion. -- updated  GOAL:   Patient will meet greater than or equal to 90% of their needs -- progressing  MONITOR:   PO intake, Supplement acceptance, Labs, Weight trends, I & O's  REASON FOR ASSESSMENT:   Consult Assessment of nutrition requirement/status  ASSESSMENT:   85 y.o. female with a history of depression who presented to the ED with siucidal ideation in the setting of continued functional decline, debility associated with tremors, bradykinesia. Urinalysis suggestive of UTI for which ceftriaxone was initially provided though with no reported symptoms, stopped awaiting urine culture. Due to [facility], psychiatry consulted initially recommending inpatient psychiatric stabilization, but the patient is no longer suicidal and committed to rehabilitation.  7/18 tx to [facility] for continued neurology workup   Pt/family undergoing process to appeal insurance denial for CIR.   Discussed pt with RN who reports pt is eating better and is typically accepting Ensure supplements. Pt requests pill form of Megace (will ask MD/Pharmacy), but otherwise has no complaints and endorses improved appetite.   PO Intake: 0-100% x last 8 recorded meals (~54% average meal intake)  No weight has been updated since 07/14/20  Medications:   brinzolamide  1 drop Left Eye BID   cyanocobalamin  1,000 mcg Intramuscular Q0600   enoxaparin (LOVENOX) injection  40 mg Subcutaneous Q24H   feeding supplement  237 mL Oral BID BM   FLUoxetine  10 mg Oral Daily   [START ON 07/28/2020] FLUoxetine  20  mg Oral Daily   latanoprost  1 drop Left Eye QHS   megestrol  400 mg Oral Daily   multivitamin with minerals  1 tablet Oral Daily   simvastatin  20 mg Oral Daily   traZODone  25 mg Oral QHS   [START ON 07/28/2020] venlafaxine XR  37.5 mg Oral Q breakfast   venlafaxine XR  75 mg Oral Q breakfast   Labs: Recent Labs  Lab 07/22/20 0205 07/23/20 0337 07/24/20 0403  NA 135 137 134*  K 3.6 3.8 3.9  CL 107 107 107  CO2 22 22 21*  BUN 19 16 12   CREATININE 0.63 0.60 0.64  CALCIUM 8.9 8.5* 9.0  MG 1.9 2.0 2.0  GLUCOSE 146* 112* 101*   UOP: documented x24 hours  NUTRITION - FOCUSED PHYSICAL EXAM: Flowsheet Row Most Recent Value  Orbital Region Moderate depletion  Upper Arm Region Severe depletion  Thoracic and Lumbar Region Severe depletion  Buccal Region Severe depletion  Temple Region Severe depletion  Clavicle Bone Region Severe depletion  Clavicle and Acromion Bone Region Severe depletion  Scapular Bone Region Severe depletion  Dorsal Hand Severe depletion  Patellar Region Severe depletion  Anterior Thigh Region Severe depletion  Posterior Calf Region Severe depletion  Edema (RD Assessment) None  Hair Reviewed  Eyes Reviewed  Mouth Reviewed  Skin Reviewed  Nails Reviewed      Diet Order:   Diet Order             Diet regular Room service appropriate? Yes; Fluid consistency: Thin  Diet effective now  EDUCATION NEEDS:   No education needs have been identified at this time  Skin:  Skin Assessment: Reviewed RN Assessment  Last BM:  7/17  Height:   Ht Readings from Last 1 Encounters:  07/14/20 5\' 7"  (1.702 m)    Weight:   Wt Readings from Last 1 Encounters:  07/14/20 51.3 kg    BMI:  Body mass index is 17.7 kg/m.  Estimated Nutritional Needs:   Kcal:  1500-1700  Protein:  65-80 grams  Fluid:  1.6L/day  09/14/20, MS, RD, LDN (she/her/hers) RD pager number and weekend/on-call pager number located in  Amion.

## 2020-07-24 NOTE — Progress Notes (Signed)
Called Pharmacy to see if Megace could be switched to pill form instead of oral liquid, because patient has a hard time tolerating the taste. Was told she would need 5 pills to equal same dose. Will ask patient if she would like to switch. With Oral liquid patient likes taking sips of ensure in between squirts from syringe. Preference for vanilla ensure if possible.

## 2020-07-24 NOTE — Progress Notes (Signed)
Physical Therapy Treatment Patient Details Name: Sheila Andrade MRN: [id number] DOB: Aug 04, 1935 Today's Date: 07/24/2020    History of Present Illness 85 y.o. female adm with recurrent UTI PMH:  recent COVID, glaucoma osteoporosis, HLD, depression . Pysch consulted    PT Comments    Patient received sitting up on [facility] with NT present. Patient wants to get back into bed, with encouragement she is agreeable to sitting up in recliner. Patient assisted with +2 assist and use of stedy to stand from [facility] and transfer into recliner. Patient required standing for cleaning and became anxious with that and needed mod +2 to maintain standing. Overall she did well with using stedy. She will continue to benefit from skilled PT while here to improve functional independence and safety with mobility.          Follow Up Recommendations  CIR     Equipment Recommendations  None recommended by PT    Recommendations for Other Services Rehab consult     Precautions / Restrictions Precautions Precautions: Fall Restrictions Weight Bearing Restrictions: No    Mobility  Bed Mobility               General bed mobility comments: patient received sitting up on BSC with NT present in room. NT stated it did not go well. ( Getting her to [facility])    Transfers Overall transfer level: Needs assistance Equipment used: Ambulation equipment used Transfers: Sit to/from Stand Sit to Stand: +2 physical assistance;+2 safety/equipment;Mod assist         General transfer comment: Mod assist +2 for sit to stand in stedy. Patient required cleaning while standing and became more anxious as time went by. Patient did well, just anxious and fearful.  Ambulation/Gait             General Gait Details: not attempted this session   Stairs             Wheelchair Mobility    Modified Rankin (Stroke Patients Only)       Balance Overall balance assessment: Needs assistance Sitting-balance support:  Feet supported Sitting balance-Leahy Scale: Good Sitting balance - Comments: sitting on BSC independently   Standing balance support: Bilateral upper extremity supported;During functional activity Standing balance-Leahy Scale: Poor Standing balance comment: reliant on UEs and stedy                            Cognition Arousal/Alertness: Awake/alert Behavior During Therapy: Anxious Overall Cognitive Status: No family/caregiver present to determine baseline cognitive functioning Area of Impairment: Following commands;Safety/judgement;Problem solving;Awareness;Attention;Memory                   Current Attention Level: Sustained Memory: Decreased short-term memory Following Commands: Follows one step commands with increased time;Follows one step commands consistently Safety/Judgement: Decreased awareness of safety;Decreased awareness of deficits Awareness: Emergent Problem Solving: Requires verbal cues;Slow processing;Requires tactile cues;Decreased initiation General Comments: Fearful with mobility      Exercises      General Comments        Pertinent Vitals/Pain Pain Assessment: No/denies pain    Home Living                      Prior Function            PT Goals (current goals can now be found in the care plan section) Acute Rehab PT Goals Patient Stated Goal: participate in therapy PT Goal Formulation: With  patient Time For Goal Achievement: 07/29/20 Potential to Achieve Goals: Fair Progress towards PT goals: Progressing toward goals    Frequency    Min 3X/week      PT Plan Current plan remains appropriate    Co-evaluation              AM-PAC PT "6 Clicks" Mobility   Outcome Measure  Help needed turning from your back to your side while in a flat bed without using bedrails?: A Little Help needed moving from lying on your back to sitting on the side of a flat bed without using bedrails?: A Lot Help needed moving to and  from a bed to a chair (including a wheelchair)?: Total Help needed standing up from a chair using your arms (e.g., wheelchair or bedside chair)?: A Lot Help needed to walk in hospital room?: Total Help needed climbing 3-5 steps with a railing? : Total 6 Click Score: 10    End of Session Equipment Utilized During Treatment: Gait belt Activity Tolerance: Patient tolerated treatment well Patient left: with call bell/phone within reach;in chair;with family/visitor present Nurse Communication: Mobility status;Need for lift equipment PT Visit Diagnosis: Other abnormalities of gait and mobility (R26.89);Difficulty in walking, not elsewhere classified (R26.2)     Time: 1497-0263 PT Time Calculation (min) (ACUTE ONLY): 20 min  Charges:  $Therapeutic Activity: 8-22 mins                    Kaelin Holford, PT, GCS 07/24/20,1:00 PM

## 2020-07-24 NOTE — Progress Notes (Signed)
Inpatient Rehab Admissions Coordinator:   I spoke with Pt. And her daughters Corrie Dandy and Larene Beach and they wish to appeal insurance denial. I have faxed request for a fast appeal to [facility] along with supporting clinicals.  Megan Salon, MS, CCC-SLP Rehab Admissions Coordinator  [phone] (celll) [phone] (office)

## 2020-07-24 NOTE — Progress Notes (Signed)
Inpatient Rehab Admissions Coordinator:   Insurance has denied CIR following peer to peer with acute MD. I have contacted Pt.'s daughter Marisue Ivan and left a message with her other daughter Corrie Dandy to discuss whether or not they want to do a peer to peer.   Megan Salon, MS, CCC-SLP Rehab Admissions Coordinator  [phone] (celll) [phone] (office)

## 2020-07-24 NOTE — Consult Note (Signed)
[facility] Face-to-Face Psychiatry Consult   [facility]:  [facility] Referring Physician:  Glade Lloyd Patient Identification: Sheila Andrade MRN:  [id number] Principal Diagnosis: MDD (major depressive disorder), recurrent episode, severe (HCC) Diagnosis:  Principal Problem:   MDD (major depressive disorder), recurrent episode, severe (HCC) Active Problems:   Weight loss   Anemia   UTI (urinary tract infection)   Hyponatremia   Suicidal ideations   Dehydration   Total Time spent with patient: 20 minutes  Subjective:   Sheila Andrade is a 85 y.o. female patient admitted due to confusion and inability to stand as well as weakness and loss of balance.  HPI:  Pt is a 85 yo female w/ depression secondary to medical problems. Pt seen at bedside. Pt states feeling more disheartened today because she "has no energy to do anything". Pt feels that she will never be able to move as she used to prior to fall. Pt feels she has strong familial support and wants to get better. Pt still hopeful that rehabilitation will allow her to become more mobile.  Pt denies SI/HI/AVH.   Past Psychiatric History: MDD  Risk to Self:   No Risk to Others:   No Prior Inpatient Therapy:   None Prior Outpatient Therapy:   yes  Past Medical History:  Past Medical History:  Diagnosis Date   Hot flashes 06/06/2020   Hypertension    History reviewed. No pertinent surgical history. Family History:  Family History  Problem Relation Age of Onset   Hypertension Other    Family Psychiatric  History: n/a Social History:  Social History   Substance and Sexual Activity  Alcohol Use None     Social History   Substance and Sexual Activity  Drug Use Never    Social History   Socioeconomic History   Marital status: Widowed    Spouse name: Not on file   Number of children: Not on file   Years of education: Not on file   Highest education level: Not on file   Occupational History   Not on file  Tobacco Use   Smoking status: Never   Smokeless tobacco: Never  Substance and Sexual Activity   Alcohol use: Not on file   Drug use: Never   Sexual activity: Not on file  Other Topics Concern   Not on file  Social History Narrative   Not on file   Social Determinants of Health   Financial Resource Strain: Not on file  Food Insecurity: Not on file  Transportation Needs: Not on file  Physical Activity: Not on file  Stress: Not on file  Social Connections: Not on file   Additional Social History:    Allergies:  No Known Allergies  Labs:  Results for orders placed or performed during the hospital encounter of 07/14/20 (from the past 48 hour(s))  TSH     Status: None   Collection Time: 07/22/20 11:27 AM  Result Value Ref Range   TSH 1.380 0.350 - 4.500 uIU/mL    Comment: Performed by a 3rd Generation assay with a functional sensitivity of <=0.01 uIU/mL. Performed at [facility], [address] [place], [place] [address]   Brain natriuretic peptide     Status: None   Collection Time: 07/23/20  3:37 AM  Result Value Ref Range   B Natriuretic Peptide 94.5 0.0 - 100.0 pg/mL    Comment: Performed at [facility], [address], [place], [place] [address]  CBC  with Differential/Platelet     Status: Abnormal   Collection Time: 07/23/20  3:37 AM  Result Value Ref Range   WBC 6.3 4.0 - 10.5 K/uL   RBC 3.50 (L) 3.87 - 5.11 MIL/uL   Hemoglobin 10.1 (L) 12.0 - 15.0 g/dL   HCT 62.1 (L) 30.8 - 65.7 %   MCV 88.3 80.0 - 100.0 fL   MCH 28.9 26.0 - 34.0 pg   MCHC 32.7 30.0 - 36.0 g/dL   RDW 84.6 96.2 - 95.2 %   Platelets 291 150 - 400 K/uL   nRBC 0.0 0.0 - 0.2 %   Neutrophils Relative % 56 %   Neutro Abs 3.5 1.7 - 7.7 K/uL   Lymphocytes Relative 31 %   Lymphs Abs 2.0 0.7 - 4.0 K/uL   Monocytes Relative 10 %   Monocytes Absolute 0.6 0.1 - 1.0 K/uL   Eosinophils Relative 2 %   Eosinophils Absolute 0.1 0.0 - 0.5 K/uL    Basophils Relative 1 %   Basophils Absolute 0.0 0.0 - 0.1 K/uL   Immature Granulocytes 0 %   Abs Immature Granulocytes 0.02 0.00 - 0.07 K/uL    Comment: Performed at [facility], [address], [place], [place] [address]  Comprehensive metabolic panel     Status: Abnormal   Collection Time: 07/23/20  3:37 AM  Result Value Ref Range   Sodium 137 135 - 145 mmol/L   Potassium 3.8 3.5 - 5.1 mmol/L   Chloride 107 98 - 111 mmol/L   CO2 22 22 - 32 mmol/L   Glucose, Bld 112 (H) 70 - 99 mg/dL    Comment: Glucose reference range applies only to samples taken after fasting for at least 8 hours.   BUN 16 8 - 23 mg/dL   Creatinine, Ser 4.40 0.44 - 1.00 mg/dL   Calcium 8.5 (L) 8.9 - 10.3 mg/dL   Total Protein 5.0 (L) 6.5 - 8.1 g/dL   Albumin 2.7 (L) 3.5 - 5.0 g/dL   AST 13 (L) 15 - 41 U/L   ALT 11 0 - 44 U/L   Alkaline Phosphatase 42 38 - 126 U/L   Total Bilirubin 0.4 0.3 - 1.2 mg/dL   GFR, Estimated >10 >27 mL/min    Comment: (NOTE) Calculated using the CKD-EPI Creatinine Equation (2021)    Anion gap 8 5 - 15    Comment: Performed at [facility], [address] [place], [place] [address]  Magnesium     Status: None   Collection Time: 07/23/20  3:37 AM  Result Value Ref Range   Magnesium 2.0 1.7 - 2.4 mg/dL    Comment: Performed at [facility], [address] [place], [place] [address]  Brain natriuretic peptide     Status: None   Collection Time: 07/24/20  4:03 AM  Result Value Ref Range   B Natriuretic Peptide 98.4 0.0 - 100.0 pg/mL    Comment: Performed at [facility], [address], [place], [place] [address]  CBC with Differential/Platelet     Status: Abnormal   Collection Time: 07/24/20  4:03 AM  Result Value Ref Range   WBC 6.6 4.0 - 10.5 K/uL   RBC 3.77 (L) 3.87 - 5.11 MIL/uL   Hemoglobin 10.8 (L) 12.0 - 15.0 g/dL   HCT 56.3 (L) 87.5 - 64.3 %   MCV 88.3 80.0 - 100.0 fL   MCH 28.6 26.0 - 34.0 pg   MCHC 32.4 30.0 - 36.0  g/dL   RDW 16.0 73.7 -  10.6 %   Platelets 296 150 - 400 K/uL   nRBC 0.0 0.0 - 0.2 %   Neutrophils Relative % 57 %   Neutro Abs 3.8 1.7 - 7.7 K/uL   Lymphocytes Relative 30 %   Lymphs Abs 2.0 0.7 - 4.0 K/uL   Monocytes Relative 9 %   Monocytes Absolute 0.6 0.1 - 1.0 K/uL   Eosinophils Relative 2 %   Eosinophils Absolute 0.1 0.0 - 0.5 K/uL   Basophils Relative 1 %   Basophils Absolute 0.0 0.0 - 0.1 K/uL   Immature Granulocytes 1 %   Abs Immature Granulocytes 0.03 0.00 - 0.07 K/uL    Comment: Performed at [facility], [address] [place], [place] [address]  Comprehensive metabolic panel     Status: Abnormal   Collection Time: 07/24/20  4:03 AM  Result Value Ref Range   Sodium 134 (L) 135 - 145 mmol/L   Potassium 3.9 3.5 - 5.1 mmol/L   Chloride 107 98 - 111 mmol/L   CO2 21 (L) 22 - 32 mmol/L   Glucose, Bld 101 (H) 70 - 99 mg/dL    Comment: Glucose reference range applies only to samples taken after fasting for at least 8 hours.   BUN 12 8 - 23 mg/dL   Creatinine, Ser 5.46 0.44 - 1.00 mg/dL   Calcium 9.0 8.9 - 27.0 mg/dL   Total Protein 5.7 (L) 6.5 - 8.1 g/dL   Albumin 2.8 (L) 3.5 - 5.0 g/dL   AST 14 (L) 15 - 41 U/L   ALT 13 0 - 44 U/L   Alkaline Phosphatase 42 38 - 126 U/L   Total Bilirubin 0.5 0.3 - 1.2 mg/dL   GFR, Estimated >35 >00 mL/min    Comment: (NOTE) Calculated using the CKD-EPI Creatinine Equation (2021)    Anion gap 6 5 - 15    Comment: Performed at [facility], [address] [place], [place] [address]  Magnesium     Status: None   Collection Time: 07/24/20  4:03 AM  Result Value Ref Range   Magnesium 2.0 1.7 - 2.4 mg/dL    Comment: Performed at [facility], [address] [place], [place] [address]    Current Facility-Administered Medications  Medication Dose Route Frequency Provider Last Rate Last Admin   acetaminophen (TYLENOL) tablet 650 mg  650 mg Oral Q6H PRN Therisa Doyne, MD   650 mg at 07/18/20 1112   Or   acetaminophen (TYLENOL)  suppository 650 mg  650 mg Rectal Q6H PRN Doutova, Anastassia, MD       brinzolamide (AZOPT) 1 % ophthalmic suspension 1 drop  1 drop Left Eye BID Therisa Doyne, MD   1 drop at 07/23/20 2046   cyanocobalamin ((VITAMIN B-12)) injection 1,000 mcg  1,000 mcg Intramuscular Q0600 Glade Lloyd, MD   1,000 mcg at 07/24/20 0404   enoxaparin (LOVENOX) injection 40 mg  40 mg Subcutaneous Q24H Alekh, Kshitiz, MD   40 mg at 07/23/20 1138   feeding supplement (ENSURE ENLIVE / ENSURE PLUS) liquid 237 mL  237 mL Oral BID BM Doutova, Anastassia, MD   237 mL at 07/23/20 1700   FLUoxetine (PROZAC) capsule 10 mg  10 mg Oral Daily Park Pope, MD   10 mg at 07/23/20 1041   [START ON 07/28/2020] FLUoxetine (PROZAC) capsule 20 mg  20 mg Oral Daily Park Pope, MD       hydrOXYzine (  ATARAX/VISTARIL) tablet 25 mg  25 mg Oral TID PRN Eliseo GumMcQuilla, Jai B, MD       latanoprost (XALATAN) 0.005 % ophthalmic solution 1 drop  1 drop Left Eye QHS Doutova, Jonny RuizAnastassia, MD   1 drop at 07/23/20 2046   megestrol (MEGACE) 400 MG/10ML suspension 400 mg  400 mg Oral Daily Hanley BenAlekh, Kshitiz, MD   400 mg at 07/23/20 1010   multivitamin with minerals tablet 1 tablet  1 tablet Oral Daily Glade LloydAlekh, Kshitiz, MD   1 tablet at 07/23/20 1010   polyethylene glycol (MIRALAX / GLYCOLAX) packet 17 g  17 g Oral Daily PRN Calvert Cantorizwan, Saima, MD   17 g at 07/19/20 1058   simvastatin (ZOCOR) tablet 20 mg  20 mg Oral Daily Doutova, Anastassia, MD   20 mg at 07/23/20 1010   traZODone (DESYREL) tablet 25 mg  25 mg Oral QHS Doutova, Anastassia, MD   25 mg at 07/23/20 2047   [START ON 07/28/2020] venlafaxine XR (EFFEXOR-XR) 24 hr capsule 37.5 mg  37.5 mg Oral Q breakfast Akintayo, Mojeed, MD       venlafaxine XR (EFFEXOR-XR) 24 hr capsule 75 mg  75 mg Oral Q breakfast Jannifer FranklinAkintayo, Mojeed, MD   75 mg at 07/24/20 0840     Psychiatric Specialty Exam:  Presentation  General Appearance: Appropriate for Environment; Casual  Eye Contact:Minimal  Speech:Slow; Clear and  Coherent  Speech Volume:Decreased   Mood and Affect  Mood:Depressed; Dysphoric  Affect:Depressed; Flat   Thought Process  Thought Processes:Coherent; Linear; Goal Directed  Descriptions of Associations:Intact  Orientation:Full (Time, Place and Person)  Thought Content:Logical  History of Schizophrenia/Schizoaffective disorder:No data recorded Duration of Psychotic Symptoms:No data recorded Hallucinations:No data recorded Ideas of Reference:None  Suicidal Thoughts:No data recorded Homicidal Thoughts:No data recorded  Sensorium  Memory:Recent Good; Immediate Good; Remote Good  Judgment:Fair  Insight:Fair   Executive Functions  Concentration:Fair  Attention Span:Good  Recall:Good  Fund of Knowledge:Good  Language:Good   Psychomotor Activity  Psychomotor Activity: No data recorded  Assets  Assets:Desire for Improvement; Manufacturing systems engineerCommunication Skills; Housing; Social Support; Resilience; Physical Health; Leisure Time; Vocational/Educational; Talents/Skills; Transportation; Financial Resources/Insurance   Sleep  Sleep: No data recorded  Physical Exam: Physical Exam Vitals reviewed.  Constitutional:      Appearance: Normal appearance.  HENT:     Head: Normocephalic and atraumatic.  Eyes:     Extraocular Movements: Extraocular movements intact.  Cardiovascular:     Rate and Rhythm: Normal rate and regular rhythm.  Pulmonary:     Effort: Pulmonary effort is normal.     Breath sounds: Normal breath sounds.  Abdominal:     General: Abdomen is flat.     Palpations: Abdomen is soft.  Musculoskeletal:     Cervical back: Normal range of motion.  Skin:    General: Skin is warm and dry.  Neurological:     General: No focal deficit present.     Mental Status: She is alert and oriented to person, place, and time.   Review of Systems  Constitutional:  Negative for chills and fever.  Respiratory:  Negative for cough, shortness of breath and wheezing.    Cardiovascular:  Negative for chest pain and palpitations.  Gastrointestinal:  Negative for abdominal pain, nausea and vomiting.  Skin:  Negative for itching and rash.  Neurological:  Negative for dizziness and headaches.  Blood pressure (!) 135/93, pulse 83, temperature 97.7 F (36.5 C), temperature source Oral, resp. rate 16, height 5\' 7"  (1.702 m), weight 51.3 kg, SpO2  98 %. Body mass index is 17.7 kg/m.  Treatment Plan Summary: Pt is a 85 yo female w/ depression secondary to medical problems. Reported hallucinations can be due to effexor medication.  Depression related to medical problems vs MDD -Continue Effexor XR 75 mg qd until 07/28/20 then 37.5 mg qd until 08/04/20 then stop. -Continue Prozac 10 mg until 07/28/20 then increase to Prozac 20 mg daily for depression.  -Continue Megace 400 mg daily for appetite stimulation -Continue Trazodone 25 mg at bedtime for sleep -Recommend outpatient psychiatry referral for med management upon discharge -Recommend Atarax/Vistaril 25 mg tid prn for anxiety -Recommend avoiding Ativan for patient as patient becomes too sedated with Ativan -will continue to follow  Disposition: No evidence of imminent risk to self or others at present.   Patient does not meet criteria for psychiatric inpatient admission. Supportive therapy provided about ongoing stressors.  Thank you for the consult Park Pope, MD PGY1 Psychiatry Resident 07/24/2020 9:31 AM

## 2020-07-25 DIAGNOSIS — F332 Major depressive disorder, recurrent severe without psychotic features: Secondary | ICD-10-CM | POA: Diagnosis not present

## 2020-07-25 LAB — COMPREHENSIVE METABOLIC PANEL
ALT: 13 U/L (ref 0–44)
AST: 15 U/L (ref 15–41)
Albumin: 2.7 g/dL — ABNORMAL LOW (ref 3.5–5.0)
Alkaline Phosphatase: 49 U/L (ref 38–126)
Anion gap: 7 (ref 5–15)
BUN: 17 mg/dL (ref 8–23)
CO2: 21 mmol/L — ABNORMAL LOW (ref 22–32)
Calcium: 8.9 mg/dL (ref 8.9–10.3)
Chloride: 105 mmol/L (ref 98–111)
Creatinine, Ser: 0.7 mg/dL (ref 0.44–1.00)
GFR, Estimated: >60 mL/min (ref >60)
Glucose, Bld: 99 mg/dL (ref 70–99)
Potassium: 4.3 mmol/L (ref 3.5–5.1)
Sodium: 133 mmol/L — ABNORMAL LOW (ref 135–145)
Total Bilirubin: 0.3 mg/dL (ref 0.3–1.2)
Total Protein: 5.1 g/dL — ABNORMAL LOW (ref 6.5–8.1)

## 2020-07-25 LAB — CBC WITH DIFFERENTIAL/PLATELET
Abs Immature Granulocytes: 0.02 10*3/uL (ref 0.00–0.07)
Basophils Absolute: 0 10*3/uL (ref 0.0–0.1)
Basophils Relative: 1 %
Eosinophils Absolute: 0.1 10*3/uL (ref 0.0–0.5)
Eosinophils Relative: 2 %
HCT: 32.6 % — ABNORMAL LOW (ref 36.0–46.0)
Hemoglobin: 10.6 g/dL — ABNORMAL LOW (ref 12.0–15.0)
Immature Granulocytes: 0 %
Lymphocytes Relative: 27 %
Lymphs Abs: 1.9 10*3/uL (ref 0.7–4.0)
MCH: 28.6 pg (ref 26.0–34.0)
MCHC: 32.5 g/dL (ref 30.0–36.0)
MCV: 87.9 fL (ref 80.0–100.0)
Monocytes Absolute: 0.6 10*3/uL (ref 0.1–1.0)
Monocytes Relative: 9 %
Neutro Abs: 4.3 10*3/uL (ref 1.7–7.7)
Neutrophils Relative %: 61 %
Platelets: 321 10*3/uL (ref 150–400)
RBC: 3.71 MIL/uL — ABNORMAL LOW (ref 3.87–5.11)
RDW: 14.6 % (ref 11.5–15.5)
WBC: 6.9 10*3/uL (ref 4.0–10.5)
nRBC: 0 % (ref 0.0–0.2)

## 2020-07-25 LAB — BRAIN NATRIURETIC PEPTIDE: B Natriuretic Peptide: 77.2 pg/mL (ref 0.0–100.0)

## 2020-07-25 LAB — MAGNESIUM: Magnesium: 2 mg/dL (ref 1.7–2.4)

## 2020-07-25 NOTE — Progress Notes (Signed)
Occupational Therapy Treatment Patient Details Name: Sheila Andrade MRN: [id number] DOB: 10/30/1935 Today's Date: 07/25/2020    History of present illness 85 y.o. female adm with recurrent UTI PMH:  recent COVID, glaucoma osteoporosis, HLD, depression . Pysch consulted   OT comments  Pt progressing towards established OT goals and motivated to participate in therapy. Session performed in collaboration with PT to address functional transfers and safety with ADLs. Pt performing sit<>stand transfers using Stedy with Mod A +2. Pt performing grooming with Mod A for standing balance with stedy. Performing toilet transfer from [facility] to [facility] with mod A +2. Requiring total A for pericare. Pt with continued fear of falling requiring cues for upright posture. Pt continues to present with decreased strength, balance, awareness, safety, and problem solving. Due to pt support, motivation, and significant change in functional status, continue to recommend discharge to CIR for intensive OT services. Will follow acutely as admitted.    Follow Up Recommendations  CIR    Equipment Recommendations  Other (comment) (defer to next venue)    Recommendations for Other Services Rehab consult    Precautions / Restrictions Precautions Precautions: Fall Restrictions Weight Bearing Restrictions: No       Mobility Bed Mobility Overal bed mobility: Needs Assistance Bed Mobility: Supine to Sit     Supine to sit: Min assist     General bed mobility comments: Min A hand held assist. Cues to scoot forward.    Transfers Overall transfer level: Needs assistance Equipment used: Ambulation equipment used Transfers: Sit to/from Stand Sit to Stand: +2 physical assistance;+2 safety/equipment;Mod assist         General transfer comment: Mod assist +2 for sit to stand using stedy    Balance Overall balance assessment: Needs assistance Sitting-balance support: Feet supported Sitting balance-Leahy Scale:  Good Sitting balance - Comments: sitting on BSC with supervision and reaching to feet to don shoe with min Guard A Postural control: Posterior lean Standing balance support: Bilateral upper extremity supported;During functional activity Standing balance-Leahy Scale: Poor Standing balance comment: reliant on UEs on stedy and intermittent external support                           ADL either performed or assessed with clinical judgement   ADL Overall ADL's : Needs assistance/impaired     Grooming: Moderate assistance;Standing;Wash/dry face;Brushing hair Grooming Details (indicate cue type and reason): Supporting self with UE on stedy and mod A external assist. Requiring cues for upright posture. Continues to benefit from encouragement to engage in ADLs.             Lower Body Dressing: Sit to/from stand;Maximal assistance Lower Body Dressing Details (indicate cue type and reason): Mod A to guide toes into shoe, and tie shoe. verbal cues to scoot to EOB prior to donning shoes. Toilet Transfer: Moderate assistance;+2 for physical assistance;+2 for safety/equipment;BSC (to [facility] from [facility]) Statistician Details (indicate cue type and reason): To BSC from stedy. Cues for upright posture. Toileting- Clothing Manipulation and Hygiene: Total assistance;Sit to/from stand Toileting - Clothing Manipulation Details (indicate cue type and reason): Total A for pericare.     Functional mobility during ADLs: Moderate assistance;+2 for physical assistance;+2 for safety/equipment (sit<>stand transfers to stedy from EOB, BSC, and to recliner.) General ADL Comments: Pt requiring encouragement to participate in ADLs at sink. Performing grooming with Mod A for standing balance. Performing sit<>stand transfers with Mod A+2 overall. Performing x5 sit<>stand tranfers to stedy.  Performing 1 sit<>stand transfer with RW and taking steps forward. Cues for upright posture. Pt donning shoes with Max A to  guie toes in shoes and tie shoes.     Vision       Perception     Praxis      Cognition Arousal/Alertness: Awake/alert Behavior During Therapy: Anxious Overall Cognitive Status: Impaired/Different from baseline Area of Impairment: Following commands;Safety/judgement;Problem solving;Awareness;Attention;Memory                   Current Attention Level: Sustained Memory: Decreased short-term memory Following Commands: Follows one step commands with increased time;Follows one step commands consistently Safety/Judgement: Decreased awareness of safety;Decreased awareness of deficits Awareness: Emergent Problem Solving: Slow processing;Requires verbal cues;Requires tactile cues;Difficulty sequencing General Comments: Pt requiring verbal cues for sequencing to come to EOB. PT requiring cues for upright posture. Pt with fear of falling forward and backward throughout session. Pt with decreased awareness, asking to move on to the next task several times despite education regarding goals of therapy to help pt perform ADLs and functional mobility.        Exercises Exercises: Other exercises Other Exercises Other Exercises: x5 sit<>stand with stedy Other Exercises: x1 sit<>stand with RW   Shoulder Instructions       General Comments Pt continues to present with fear of falling, however, pleasant througout and asking therapy not to leave.    Pertinent Vitals/ Pain       Pain Assessment: Faces Faces Pain Scale: Hurts a little bit Pain Location: L Shoulder Pain Descriptors / Indicators: Discomfort;Guarding Pain Intervention(s): Limited activity within patient's tolerance  Home Living                                          Prior Functioning/Environment              Frequency  Min 2X/week        Progress Toward Goals  OT Goals(current goals can now be found in the care plan section)  Progress towards OT goals: Progressing toward goals  Acute  Rehab OT Goals Patient Stated Goal: participate in therapy OT Goal Formulation: With patient/family Time For Goal Achievement: 07/30/20 Potential to Achieve Goals: Good ADL Goals Pt Will Transfer to Toilet: with min guard assist;ambulating;bedside commode Pt Will Perform Toileting - Clothing Manipulation and hygiene: with min assist;sit to/from stand;sitting/lateral leans Additional ADL Goal #1: Patient will tolerate 5 minutes standing activity at min G level in order to participate in self care tasks. Additional ADL Goal #2: patient will follow 1 step directions with less than 25% multimodal cues in order to participate in self care tasks.  Plan Discharge plan remains appropriate    Co-evaluation    PT/OT/SLP Co-Evaluation/Treatment: Yes Reason for Co-Treatment: Necessary to address cognition/behavior during functional activity;For patient/therapist safety;Complexity of the patient's impairments (multi-system involvement);To address functional/ADL transfers PT goals addressed during session: Mobility/safety with mobility;Balance;Proper use of DME OT goals addressed during session: ADL's and self-care;Proper use of Adaptive equipment and DME      AM-PAC OT "6 Clicks" Daily Activity     Outcome Measure   Help from another person eating meals?: A Little Help from another person taking care of personal grooming?: A Little Help from another person toileting, which includes using toliet, bedpan, or urinal?: Total Help from another person bathing (including washing, rinsing, drying)?: A Lot Help from another person to  put on and taking off regular upper body clothing?: A Little Help from another person to put on and taking off regular lower body clothing?: A Lot 6 Click Score: 14    End of Session Equipment Utilized During Treatment: Gait belt;Rolling walker;Other (comment) (stedy)  OT Visit Diagnosis: Other abnormalities of gait and mobility (R26.89);Muscle weakness (generalized)  (M62.81);Other symptoms and signs involving cognitive function;Unsteadiness on feet (R26.81)   Activity Tolerance Patient tolerated treatment well   Patient Left with call bell/phone within reach;in chair;with chair alarm set   Nurse Communication Mobility status        Time: 1430-1501 OT Time Calculation (min): 31 min  Charges: OT General Charges $OT Visit: 1 Visit OT Treatments $Self Care/Home Management : 8-22 mins  Ladene Artist, OTDS   Ladene Artist 07/25/2020, 3:59 PM

## 2020-07-25 NOTE — Progress Notes (Signed)
Family has hired Engineer, site while they are on upcoming vacation, start date pending. Unsure if for days or overnight

## 2020-07-25 NOTE — Progress Notes (Signed)
PROGRESS NOTE                                                                                                                                                                                                             Patient Demographics:    Sheila Andrade, is a 85 y.o. female, DOB - Sep 29, 1935, GQB:[id number]  Outpatient Primary MD for the patient is Dalphine Handing, MD    LOS - 9  Admit date - 07/14/2020    Chief Complaint  Patient presents with   Failure To Thrive   Fatigue       Brief Narrative (HPI from H&P)  - 85 year old female with history of depression, ongoing functional decline, fall with history of pelvic fracture, recent development of tremors and bradykinesia who was brought in for suicidal ideation, decreased oral intake and ongoing generalized deconditioning, was seen by psych and neurology also as she had ongoing functional decline with bradykinesia.  She was admitted to [facility] long hospital thereafter transferred to [facility] on 07/22/2020 for continued neurology follow-up.   Subjective:   Patient in bed, appears comfortable, denies any headache, no fever, no chest pain or pressure, no shortness of breath , no abdominal pain. No new focal weakness.   Assessment  & Plan :     Severe depression and suicidal ideation.  Seen by psych psych is following.  Currently not actively suicidal or homicidal, still mildly anxious, on Effexor and trazodone and will defer all psych issues to psychiatry directly, have requested them to follow on a daily basis.  2.  Failure to thrive and ongoing functional decline with decreased oral intake.  Supportive care, psych treatment as above.  Continue oral protein supplementation, oral intake has improved, she will require SNF/CIR depending on insurance approval.  3.  Poor balance, bradykinesia, hyperreflexia in both lower extremities.  MRI brain, C and L-spine unremarkable,  neurology is following, tcase discussed with neurologist on 07/23/2020 outpatient neurology work-up with continued PT OT , will need placement.  4.  Vitamin B12 deficiency.  Placed on replacement.  5.  Mild hyponatremia.  Resolved after IV fluids.  6.  Moderate to severe protein calorie malnutrition.  On supplements.  7.  Abnormal UA.  UTI ruled out clinically.         Condition -fair  Family Communication  : Message  left for patient's daughter DC through patient's phone on 07/22/2020, daughter updated bedside on 07/23/2020  Corrie Dandy [phone] - 07/24/20   Code Status : Full  Consults  : Psychiatry, neurology  PUD Prophylaxis : None   Procedures  :     MRI Brain C&T Spine.  Nonacute.      Disposition Plan  :    Status is: Inpatient  Remains inpatient appropriate because:IV treatments appropriate due to intensity of illness or inability to take PO  Dispo: The patient is from: Home              Anticipated d/c is to: SNF              Patient currently is not medically stable to d/c.   Difficult to place patient No  DVT Prophylaxis  :    enoxaparin (LOVENOX) injection 40 mg Start: 07/21/20 1200 SCDs Start: 07/15/20 0036   Lab Results  Component Value Date   PLT 321 07/25/2020    Diet :  Diet Order             Diet regular Room service appropriate? Yes; Fluid consistency: Thin  Diet effective now                    Inpatient Medications  Scheduled Meds:  brinzolamide  1 drop Left Eye BID   enoxaparin (LOVENOX) injection  40 mg Subcutaneous Q24H   feeding supplement  237 mL Oral BID BM   FLUoxetine  10 mg Oral Daily   [START ON 07/28/2020] FLUoxetine  20 mg Oral Daily   latanoprost  1 drop Left Eye QHS   megestrol  400 mg Oral Daily   multivitamin with minerals  1 tablet Oral Daily   simvastatin  20 mg Oral Daily   traZODone  25 mg Oral QHS   [START ON 07/28/2020] venlafaxine XR  37.5 mg Oral Q breakfast   venlafaxine XR  75 mg Oral Q breakfast    Continuous Infusions:   PRN Meds:.acetaminophen **OR** acetaminophen, hydrOXYzine, polyethylene glycol  Antibiotics  :    Anti-infectives (From admission, onward)    Start     Dose/Rate Route Frequency Ordered Stop   07/15/20 2000  cefTRIAXone (ROCEPHIN) 1 g in sodium chloride 0.9 % 100 mL IVPB  Status:  Discontinued        1 g 200 mL/hr over 30 Minutes Intravenous Every 24 hours 07/15/20 0000 07/15/20 1635   07/14/20 2315  cefTRIAXone (ROCEPHIN) 1 g in sodium chloride 0.9 % 100 mL IVPB        1 g 200 mL/hr over 30 Minutes Intravenous  Once 07/14/20 2307 07/15/20 0040        Time Spent in minutes  30   Susa Raring M.D on 07/25/2020 at 10:52 AM  To page go to [email or web address]   [facility] -  Office  [phone]  See all Orders from today for further details    Objective:   Vitals:   07/24/20 2046 07/24/20 2318 07/25/20 0334 07/25/20 0742  BP: 127/70 109/67 116/68 134/76  Pulse: 87 82 73 80  Resp: 18 18 16 17   Temp: 98.4 F (36.9 C) 98.2 F (36.8 C) 98.1 F (36.7 C) 98.2 F (36.8 C)  TempSrc: Oral Oral Oral Oral  SpO2: 96% 97% 97% 97%  Weight:      Height:        Wt Readings from Last 3 Encounters:  07/14/20 51.3 kg  05/31/20 54.4 kg  05/27/20 52.8 kg     Intake/Output Summary (Last 24 hours) at 07/25/2020 1052 Last data filed at 07/25/2020 0800 Gross per 24 hour  Intake --  Output 1700 ml  Net -1700 ml      Physical Exam  Mildly somnolent but waking up, alert x 2, bilateral lower extremity strength 4/5 with increased tone and hyperreflexia, normal affect   Heard.AT,PERRAL Supple Neck,No JVD, No cervical lymphadenopathy appriciated.  Symmetrical Chest wall movement, Good air movement bilaterally, CTAB RRR,No Gallops, Rubs or new Murmurs, No Parasternal Heave +ve B.Sounds, Abd Soft, No tenderness, No organomegaly appriciated, No rebound - guarding or rigidity. No Cyanosis,      Data Review:    CBC Recent Labs  Lab 07/21/20 0544  07/22/20 0205 07/23/20 0337 07/24/20 0403 07/25/20 0102  WBC 6.0 6.4 6.3 6.6 6.9  HGB 12.6 10.3* 10.1* 10.8* 10.6*  HCT 40.1 32.4* 30.9* 33.3* 32.6*  PLT 286 297 291 296 321  MCV 89.5 89.0 88.3 88.3 87.9  MCH 28.1 28.3 28.9 28.6 28.6  MCHC 31.4 31.8 32.7 32.4 32.5  RDW 14.3 14.4 14.7 14.6 14.6  LYMPHSABS 1.6 1.6 2.0 2.0 1.9  MONOABS 0.6 0.6 0.6 0.6 0.6  EOSABS 0.1 0.1 0.1 0.1 0.1  BASOSABS 0.1 0.1 0.0 0.0 0.0    Recent Labs  Lab 07/21/20 0544 07/22/20 0205 07/22/20 1127 07/23/20 0337 07/24/20 0403 07/25/20 0102  NA 134* 135  --  137 134* 133*  K 3.8 3.6  --  3.8 3.9 4.3  CL 101 107  --  107 107 105  CO2 26 22  --  22 21* 21*  GLUCOSE 116* 146*  --  112* 101* 99  BUN 11 19  --  16 12 17   CREATININE 0.67 0.63  --  0.60 0.64 0.70  CALCIUM 9.1 8.9  --  8.5* 9.0 8.9  AST 19 13*  --  13* 14* 15  ALT 13 12  --  11 13 13   ALKPHOS 60 50  --  42 42 49  BILITOT 0.3 0.3  --  0.4 0.5 0.3  ALBUMIN 3.4* 2.8*  --  2.7* 2.8* 2.7*  MG 2.0 1.9  --  2.0 2.0 2.0  TSH  --   --  1.380  --   --   --   BNP  --   --   --  94.5 98.4 77.2    ------------------------------------------------------------------------------------------------------------------ No results for input(s): CHOL, HDL, LDLCALC, TRIG, CHOLHDL, LDLDIRECT in the last 72 hours.  No results found for: HGBA1C ------------------------------------------------------------------------------------------------------------------ Recent Labs    07/22/20 1127  TSH 1.380    Cardiac Enzymes No results for input(s): CKMB, TROPONINI, MYOGLOBIN in the last 168 hours.  Invalid input(s): CK ------------------------------------------------------------------------------------------------------------------    Component Value Date/Time   BNP 77.2 07/25/2020 0102     Radiology Reports DG Chest 2 View  Result Date: 07/14/2020 CLINICAL DATA:  Failure to thrive, weight loss EXAM: CHEST - 2 VIEW COMPARISON:  Radiograph 05/31/2020  FINDINGS: Suspect a small calcified mediastinal lymph node seen on lateral radiograph. Remaining cardiomediastinal contours are unremarkable. Bandlike opacity in the right lung base, likely scarring given similar to prior. No consolidation, features of edema, pneumothorax, or effusion. Degenerative changes are present in the imaged spine and shoulders. Evidence of calcific tendinosis in the left shoulder. Left cervical carotid atherosclerosis. IMPRESSION: Suspect a small calcified mediastinal/hilar node. Can reflect sequela of prior granulomatous disease. No acute cardiopulmonary abnormality. Electronically Signed   By: 09/14/2020.D.  On: 07/14/2020 20:56   MR BRAIN WO CONTRAST  Result Date: 07/14/2020 CLINICAL DATA:  Mental status change. EXAM: MRI HEAD WITHOUT CONTRAST TECHNIQUE: Multiplanar, multiecho pulse sequences of the brain and surrounding structures were obtained without intravenous contrast. COMPARISON:  None. FINDINGS: Brain: No acute infarction, acute hemorrhage, hydrocephalus, extra-axial collection or mass lesion. Moderate scattered T2/FLAIR hyperintensities within the white matter, which are nonspecific but most likely related to chronic microvascular ischemic disease given patient age. Mild-to-moderate atrophy with ex vacuo ventricular dilation. The callosal angle is not acute and there is no crowding of sulci at the vertex. Dilated perivascular space in the inferior left basal ganglia. Small focus of susceptibility artifact in the lateral left cerebellum, most likely a prior microhemorrhage. Vascular: Major arterial flow voids are maintained skull base. Skull and upper cervical spine: Normal marrow signal. Sinuses/Orbits: Visualized sinuses are clear. No acute orbital findings. Other: Small left mastoid effusion. IMPRESSION: 1. No evidence of acute intracranial abnormality 2. Moderate chronic microvascular ischemic disease and mild-to-moderate atrophy. 3. Small left mastoid effusion.  Electronically Signed   By: Feliberto Harts MD   On: 07/14/2020 20:11   MR CERVICAL SPINE W WO CONTRAST  Result Date: 07/22/2020 CLINICAL DATA:  Hyper-reflexia and abnormal gait EXAM: MRI CERVICAL AND THORACIC SPINE WITHOUT AND WITH CONTRAST TECHNIQUE: Multiplanar and multiecho pulse sequences of the cervical spine, to include the craniocervical junction and cervicothoracic junction, and the thoracic spine, were obtained without and with intravenous contrast. CONTRAST:  5mL GADAVIST GADOBUTROL 1 MMOL/ML IV SOLN COMPARISON:  None. FINDINGS: MRI CERVICAL SPINE FINDINGS Alignment: Physiologic. Vertebrae: No fracture, evidence of discitis, or bone lesion. Cord: Normal signal and morphology. Posterior Fossa, vertebral arteries, paraspinal tissues: Negative. Disc levels: Axial images are severely motion degraded. C1-2: Unremarkable. C2-3: Normal disc space and facet joints. There is no spinal canal stenosis. No neural foraminal stenosis. C3-4: Normal disc space and facet joints. There is no spinal canal stenosis. No neural foraminal stenosis. C4-5: Normal disc space and facet joints. There is no spinal canal stenosis. No neural foraminal stenosis. C5-6: Small left asymmetric disc bulge. There is no spinal canal stenosis. Mild left neural foraminal stenosis. C6-7: Left asymmetric disc bulge with uncovertebral spurring. There is no spinal canal stenosis. Moderate left neural foraminal stenosis. C7-T1: Normal disc space and facet joints. There is no spinal canal stenosis. No neural foraminal stenosis. MRI THORACIC SPINE FINDINGS Alignment:  Physiologic. Vertebrae: No fracture, evidence of discitis, or bone lesion. Cord:  Normal signal and morphology. Paraspinal and other soft tissues: Negative. Disc levels: Severely motion degraded axial images. No spinal canal stenosis. There is a small central disc protrusion noted at T7-8. No abnormal contrast enhancement. IMPRESSION: 1. Motion degraded study. 2. No acute  abnormality of the cervical or thoracic spine. 3. Moderate left C6-7 and mild left C5-6 neural foraminal stenosis. 4. No spinal canal stenosis. Electronically Signed   By: Deatra Robinson M.D.   On: 07/22/2020 21:07   MR THORACIC SPINE W WO CONTRAST  Result Date: 07/22/2020 CLINICAL DATA:  Hyper-reflexia and abnormal gait EXAM: MRI CERVICAL AND THORACIC SPINE WITHOUT AND WITH CONTRAST TECHNIQUE: Multiplanar and multiecho pulse sequences of the cervical spine, to include the craniocervical junction and cervicothoracic junction, and the thoracic spine, were obtained without and with intravenous contrast. CONTRAST:  5mL GADAVIST GADOBUTROL 1 MMOL/ML IV SOLN COMPARISON:  None. FINDINGS: MRI CERVICAL SPINE FINDINGS Alignment: Physiologic. Vertebrae: No fracture, evidence of discitis, or bone lesion. Cord: Normal signal and morphology. Posterior Fossa, vertebral arteries,  paraspinal tissues: Negative. Disc levels: Axial images are severely motion degraded. C1-2: Unremarkable. C2-3: Normal disc space and facet joints. There is no spinal canal stenosis. No neural foraminal stenosis. C3-4: Normal disc space and facet joints. There is no spinal canal stenosis. No neural foraminal stenosis. C4-5: Normal disc space and facet joints. There is no spinal canal stenosis. No neural foraminal stenosis. C5-6: Small left asymmetric disc bulge. There is no spinal canal stenosis. Mild left neural foraminal stenosis. C6-7: Left asymmetric disc bulge with uncovertebral spurring. There is no spinal canal stenosis. Moderate left neural foraminal stenosis. C7-T1: Normal disc space and facet joints. There is no spinal canal stenosis. No neural foraminal stenosis. MRI THORACIC SPINE FINDINGS Alignment:  Physiologic. Vertebrae: No fracture, evidence of discitis, or bone lesion. Cord:  Normal signal and morphology. Paraspinal and other soft tissues: Negative. Disc levels: Severely motion degraded axial images. No spinal canal stenosis. There is  a small central disc protrusion noted at T7-8. No abnormal contrast enhancement. IMPRESSION: 1. Motion degraded study. 2. No acute abnormality of the cervical or thoracic spine. 3. Moderate left C6-7 and mild left C5-6 neural foraminal stenosis. 4. No spinal canal stenosis. Electronically Signed   By: Deatra RobinsonKevin  Herman M.D.   On: 07/22/2020 21:07   DG Hip Unilat W or Wo Pelvis 2-3 Views Left  Result Date: 07/14/2020 CLINICAL DATA:  Failure to thrive and weight loss EXAM: DG HIP (WITH OR WITHOUT PELVIS) 2-3V LEFT COMPARISON:  01/01/2020 FINDINGS: Pelvic ring is intact. Old healed pubic rami fractures are noted on the left stable from prior CT examination. Degenerative changes of the hip joints are noted right greater than left. No acute fracture or dislocation in the left hip is noted. Some widening of the left sacroiliac joint is noted which appears more prominent than that seen on prior CT. IMPRESSION: Slight widening of the left sacroiliac joint which may be projectional in nature. Degenerative changes of the hip joints bilaterally right greater than left. No acute fracture is seen. Old fractures in the left pubic rami are seen. Electronically Signed   By: Alcide CleverMark  Lukens M.D.   On: 07/14/2020 20:56

## 2020-07-25 NOTE — Progress Notes (Signed)
Physical Therapy Treatment Patient Details Name: Sheila Andrade MRN: [id number] DOB: Dec 11, 1935 Today's Date: 07/25/2020    History of Present Illness 85 y.o. female adm with recurrent UTI PMH:  recent COVID, glaucoma osteoporosis, HLD, depression . Pysch consulted    PT Comments    Pt making slow, steady progress. Anxiety and fear of falling make progress slow.    Follow Up Recommendations  CIR     Equipment Recommendations  None recommended by PT    Recommendations for Other Services       Precautions / Restrictions Precautions Precautions: Fall Restrictions Weight Bearing Restrictions: No    Mobility  Bed Mobility Overal bed mobility: Needs Assistance Bed Mobility: Supine to Sit     Supine to sit: Min assist     General bed mobility comments: Assist to elevate trunk by pulling up on therapist's hand. And multiple verbal cues for sequencing all steps. Incr time to perform.    Transfers Overall transfer level: Needs assistance Equipment used: Ambulation equipment used;Rolling walker (2 wheeled) Transfers: Sit to/from Stand Sit to Stand: +2 physical assistance;Mod assist         General transfer comment: Assist to bring hips up and shift weight anteriorly. Assist to extend hips and trunk. Pt leaning heavily posteriorly but feels like she is leaning forwards. Stood 6-7 times with [place] and 1 time with walker. Bed to bsc to standing at sink to recliner with [facility]  Ambulation/Gait Ambulation/Gait assistance: Mod assist;+2 physical assistance Gait Distance (Feet): 3 Feet Assistive device: Rolling walker (2 wheeled) Gait Pattern/deviations: Step-to pattern;Decreased step length - right;Decreased step length - left;Shuffle;Leaning posteriorly;Trunk flexed Gait velocity: decr Gait velocity interpretation: <1.31 ft/sec, indicative of household ambulator General Gait Details: Assist for balance and support. Pt with posterior lean throughout and hip/knee  flexion   Stairs             Wheelchair Mobility    Modified Rankin (Stroke Patients Only)       Balance Overall balance assessment: Needs assistance Sitting-balance support: Feet supported Sitting balance-Leahy Scale: Fair Sitting balance - Comments: Leaning posteriorly with attempts to put on shoes Postural control: Posterior lean Standing balance support: Bilateral upper extremity supported;During functional activity Standing balance-Leahy Scale: Poor Standing balance comment: walker or stedy and +2 min to max assist. Pt with varying amounts of assist. Initially in stedy +2 max assist due to posterior lean despite verbal/tactile cues. In front of sink and mirror with stedy pt initially +2 mod assist and then improved to min assist.                            Cognition Arousal/Alertness: Awake/alert Behavior During Therapy: Anxious Overall Cognitive Status: Impaired/Different from baseline Area of Impairment: Following commands;Safety/judgement;Problem solving;Awareness;Attention;Memory                   Current Attention Level: Sustained Memory: Decreased short-term memory Following Commands: Follows one step commands with increased time;Follows one step commands consistently Safety/Judgement: Decreased awareness of safety;Decreased awareness of deficits Awareness: Emergent Problem Solving: Slow processing;Requires verbal cues;Requires tactile cues;Difficulty sequencing General Comments: Pt very anxious throughout session and needed frequent reassurance that she wasn't falling. Pt frequently asking to move on.      Exercises Other Exercises Other Exercises: x5 sit<>stand with stedy Other Exercises: x1 sit<>stand with RW    General Comments General comments (skin integrity, edema, etc.): Pt continues to present with fear of falling, however, pleasant througout and  asking therapy not to leave.      Pertinent Vitals/Pain Pain Assessment:  Faces Faces Pain Scale: Hurts a little bit Pain Location: L Shoulder Pain Descriptors / Indicators: Discomfort;Guarding Pain Intervention(s): Limited activity within patient's tolerance    Home Living                      Prior Function            PT Goals (current goals can now be found in the care plan section) Acute Rehab PT Goals Patient Stated Goal: participate in therapy Progress towards PT goals: Progressing toward goals    Frequency    Min 3X/week      PT Plan Current plan remains appropriate    Co-evaluation PT/OT/SLP Co-Evaluation/Treatment: Yes Reason for Co-Treatment: Complexity of the patient's impairments (multi-system involvement);For patient/therapist safety PT goals addressed during session: Mobility/safety with mobility;Balance OT goals addressed during session: ADL's and self-care;Proper use of Adaptive equipment and DME      AM-PAC PT "6 Clicks" Mobility   Outcome Measure  Help needed turning from your back to your side while in a flat bed without using bedrails?: A Little Help needed moving from lying on your back to sitting on the side of a flat bed without using bedrails?: A Little Help needed moving to and from a bed to a chair (including a wheelchair)?: Total Help needed standing up from a chair using your arms (e.g., wheelchair or bedside chair)?: Total Help needed to walk in hospital room?: Total Help needed climbing 3-5 steps with a railing? : Total 6 Click Score: 10    End of Session Equipment Utilized During Treatment: Gait belt Activity Tolerance: Patient tolerated treatment well Patient left: in chair;with call bell/phone within reach;with chair alarm set Nurse Communication: Mobility status;Need for lift equipment PT Visit Diagnosis: Other abnormalities of gait and mobility (R26.89);Difficulty in walking, not elsewhere classified (R26.2)     Time: 1430-1501 PT Time Calculation (min) (ACUTE ONLY): 31 min  Charges:   $Therapeutic Activity: 8-22 mins                     [facility] PT Acute Rehabilitation Services Pager [phone] Office [phone]    Angelina Ok [facility] 07/25/2020, 4:56 PM

## 2020-07-26 DIAGNOSIS — F332 Major depressive disorder, recurrent severe without psychotic features: Secondary | ICD-10-CM | POA: Diagnosis not present

## 2020-07-26 NOTE — Plan of Care (Signed)

## 2020-07-26 NOTE — Consult Note (Signed)
[facility]   Reason for Consult:  "severe depression/intermittent suicidal ideation" Referring Physician:  Glade LloydAlekh, Kshitiz Patient Identification: Sheila Jarvisrika Lucena MRN:  [id number] Principal Diagnosis: MDD (major depressive disorder), recurrent episode, severe (HCC) Diagnosis:  Principal Problem:   MDD (major depressive disorder), recurrent episode, severe (HCC) Active Problems:   Weight loss   Anemia   UTI (urinary tract infection)   Hyponatremia   Suicidal ideations   Dehydration   Total Time spent with patient: 20 minutes  Subjective:   Sheila Andrade is a 85 y.o. female patient admitted due to confusion and inability to stand as well as weakness and loss of balance.  HPI:  Pt is a 85 yo female w/ depression secondary to medical problems. Pt seen at bedside this morning. Pt doing well. Pt alert and oriented. Pt states she has been feeling better. Pt primary concern is that she gets "cold sweats" during her PT sessions as she is afraid she will fall. Counseled pt that she will slowly adjust to sessions and become less nervous as she continues PT. Otherwise, pt has no other complaints.  Pt denies SI/HI/AVH.   Past Psychiatric History: MDD  Risk to Self:   No Risk to Others:   No Prior Inpatient Therapy:   None Prior Outpatient Therapy:   yes  Past Medical History:  Past Medical History:  Diagnosis Date   Hot flashes 06/06/2020   Hypertension    History reviewed. No pertinent surgical history. Family History:  Family History  Problem Relation Age of Onset   Hypertension Other    Family Psychiatric  History: n/a Social History:  Social History   Substance and Sexual Activity  Alcohol Use None     Social History   Substance and Sexual Activity  Drug Use Never    Social History   Socioeconomic History   Marital status: Widowed    Spouse name: Not on file   Number of children: Not on file   Years of education: Not on file   Highest  education level: Not on file  Occupational History   Not on file  Tobacco Use   Smoking status: Never   Smokeless tobacco: Never  Substance and Sexual Activity   Alcohol use: Not on file   Drug use: Never   Sexual activity: Not on file  Other Topics Concern   Not on file  Social History Narrative   Not on file   Social Determinants of Health   Financial Resource Strain: Not on file  Food Insecurity: Not on file  Transportation Needs: Not on file  Physical Activity: Not on file  Stress: Not on file  Social Connections: Not on file   Additional Social History:    Allergies:  No Known Allergies  Labs:  Results for orders placed or performed during the hospital encounter of 07/14/20 (from the past 48 hour(s))  Brain natriuretic peptide     Status: None   Collection Time: 07/25/20  1:02 AM  Result Value Ref Range   B Natriuretic Peptide 77.2 0.0 - 100.0 pg/mL    Comment: Performed at [facility], [address] [place], [place] [address]  CBC with Differential/Platelet     Status: Abnormal   Collection Time: 07/25/20  1:02 AM  Result Value Ref Range   WBC 6.9 4.0 - 10.5 K/uL   RBC 3.71 (L) 3.87 - 5.11 MIL/uL   Hemoglobin 10.6 (L) 12.0 - 15.0 g/dL   HCT 19.132.6 (L) 47.836.0 - 29.546.0 %  MCV 87.9 80.0 - 100.0 fL   MCH 28.6 26.0 - 34.0 pg   MCHC 32.5 30.0 - 36.0 g/dL   RDW 45.3 64.6 - 80.3 %   Platelets 321 150 - 400 K/uL   nRBC 0.0 0.0 - 0.2 %   Neutrophils Relative % 61 %   Neutro Abs 4.3 1.7 - 7.7 K/uL   Lymphocytes Relative 27 %   Lymphs Abs 1.9 0.7 - 4.0 K/uL   Monocytes Relative 9 %   Monocytes Absolute 0.6 0.1 - 1.0 K/uL   Eosinophils Relative 2 %   Eosinophils Absolute 0.1 0.0 - 0.5 K/uL   Basophils Relative 1 %   Basophils Absolute 0.0 0.0 - 0.1 K/uL   Immature Granulocytes 0 %   Abs Immature Granulocytes 0.02 0.00 - 0.07 K/uL    Comment: Performed at [facility], [address], [place], [place] [address]  Comprehensive metabolic panel     Status:  Abnormal   Collection Time: 07/25/20  1:02 AM  Result Value Ref Range   Sodium 133 (L) 135 - 145 mmol/L   Potassium 4.3 3.5 - 5.1 mmol/L   Chloride 105 98 - 111 mmol/L   CO2 21 (L) 22 - 32 mmol/L   Glucose, Bld 99 70 - 99 mg/dL    Comment: Glucose reference range applies only to samples taken after fasting for at least 8 hours.   BUN 17 8 - 23 mg/dL   Creatinine, Ser 8.25 0.44 - 1.00 mg/dL   Calcium 8.9 8.9 - 00.3 mg/dL   Total Protein 5.1 (L) 6.5 - 8.1 g/dL   Albumin 2.7 (L) 3.5 - 5.0 g/dL   AST 15 15 - 41 U/L   ALT 13 0 - 44 U/L   Alkaline Phosphatase 49 38 - 126 U/L   Total Bilirubin 0.3 0.3 - 1.2 mg/dL   GFR, Estimated >70 >48 mL/min    Comment: (NOTE) Calculated using the CKD-EPI Creatinine Equation (2021)    Anion gap 7 5 - 15    Comment: Performed at [facility], [address], [place], [place] [address]  Magnesium     Status: None   Collection Time: 07/25/20  1:02 AM  Result Value Ref Range   Magnesium 2.0 1.7 - 2.4 mg/dL    Comment: Performed at [facility], [address], [place], [place] [address]    Current Facility-Administered Medications  Medication Dose Route Frequency Provider Last Rate Last Admin   acetaminophen (TYLENOL) tablet 650 mg  650 mg Oral Q6H PRN Therisa Doyne, MD   650 mg at 07/18/20 1112   Or   acetaminophen (TYLENOL) suppository 650 mg  650 mg Rectal Q6H PRN Doutova, Anastassia, MD       brinzolamide (AZOPT) 1 % ophthalmic suspension 1 drop  1 drop Left Eye BID Doutova, Anastassia, MD   1 drop at 07/25/20 2010   enoxaparin (LOVENOX) injection 40 mg  40 mg Subcutaneous Q24H Alekh, Kshitiz, MD   40 mg at 07/25/20 1332   feeding supplement (ENSURE ENLIVE / ENSURE PLUS) liquid 237 mL  237 mL Oral BID BM Doutova, Anastassia, MD   237 mL at 07/25/20 1019   FLUoxetine (PROZAC) capsule 10 mg  10 mg Oral Daily Park Pope, MD   10 mg at 07/25/20 1018   [START ON 07/28/2020] FLUoxetine (PROZAC) capsule 20 mg  20 mg Oral Daily Park Pope, MD       hydrOXYzine (ATARAX/VISTARIL) tablet 25 mg  25 mg Oral TID PRN Eliseo Gum B, MD       latanoprost (XALATAN) 0.005 % ophthalmic solution 1 drop  1 drop Left Eye QHS Doutova, Anastassia, MD   1 drop at 07/25/20 2010   megestrol (MEGACE) 400 MG/10ML suspension 400 mg  400 mg Oral Daily Hanley Ben, Kshitiz, MD   400 mg at 07/25/20 1017   multivitamin with minerals tablet 1 tablet  1 tablet Oral Daily Hanley Ben, Kshitiz, MD   1 tablet at 07/25/20 1018   polyethylene glycol (MIRALAX / GLYCOLAX) packet 17 g  17 g Oral Daily PRN Calvert Cantor, MD   17 g at 07/19/20 1058   simvastatin (ZOCOR) tablet 20 mg  20 mg Oral Daily Doutova, Anastassia, MD   20 mg at 07/25/20 1018   traZODone (DESYREL) tablet 25 mg  25 mg Oral QHS Doutova, Anastassia, MD   25 mg at 07/25/20 2010   [START ON 07/28/2020] venlafaxine XR (EFFEXOR-XR) 24 hr capsule 37.5 mg  37.5 mg Oral Q breakfast Akintayo, Mojeed, MD       venlafaxine XR (EFFEXOR-XR) 24 hr capsule 75 mg  75 mg Oral Q breakfast Jannifer Franklin, Mojeed, MD   75 mg at 07/25/20 0849     Psychiatric Specialty Exam:  Presentation  General Appearance: Appropriate for Environment; Casual  Eye Contact:Minimal  Speech:Slow; Clear and Coherent  Speech Volume:Decreased   Mood and Affect  Mood:Depressed; Dysphoric  Affect:Depressed; Flat   Thought Process  Thought Processes:Coherent; Linear; Goal Directed  Descriptions of Associations:Intact  Orientation:Full (Time, Place and Person)  Thought Content:Logical  History of Schizophrenia/Schizoaffective disorder:No data recorded Duration of Psychotic Symptoms:No data recorded Hallucinations:No data recorded Ideas of Reference:None  Suicidal Thoughts:No data recorded Homicidal Thoughts:No data recorded  Sensorium  Memory:Recent Good; Immediate Good; Remote Good  Judgment:Fair  Insight:Fair   Executive Functions  Concentration:Fair  Attention Span:Good  Recall:Good  Fund of  Knowledge:Good  Language:Good   Psychomotor Activity  Psychomotor Activity: No data recorded  Assets  Assets:Desire for Improvement; Manufacturing systems engineer; Housing; Social Support; Resilience; Physical Health; Leisure Time; Vocational/Educational; Talents/Skills; Transportation; Financial Resources/Insurance   Sleep  Sleep: No data recorded  Physical Exam: Physical Exam Vitals reviewed.  Constitutional:      Appearance: Normal appearance.  HENT:     Head: Normocephalic and atraumatic.  Eyes:     Extraocular Movements: Extraocular movements intact.  Cardiovascular:     Rate and Rhythm: Normal rate and regular rhythm.  Pulmonary:     Effort: Pulmonary effort is normal.     Breath sounds: Normal breath sounds.  Abdominal:     General: Abdomen is flat.     Palpations: Abdomen is soft.  Musculoskeletal:     Cervical back: Normal range of motion.  Skin:    General: Skin is warm and dry.  Neurological:     General: No focal deficit present.     Mental Status: She is alert and oriented to person, place, and time.   Review of Systems  Constitutional:  Negative for chills and fever.  Respiratory:  Negative for cough, shortness of breath and wheezing.   Cardiovascular:  Negative for chest pain and palpitations.  Gastrointestinal:  Negative for abdominal pain, nausea and vomiting.  Skin:  Negative for itching and rash.  Neurological:  Negative for dizziness and headaches.  Blood pressure 131/90, pulse 79, temperature 98 F (36.7 C), temperature source Oral, resp. rate 18, height 5\' 7"  (1.702 m), weight 51.3 kg, SpO2 99 %. Body mass index is 17.7  kg/m.  Treatment Plan Summary: Pt is a 85 yo female w/ depression secondary to medical problems. No changes with cross taper with Effexor to Prozac at this time. Should patient/PT feel anxiety is greatly hindering progress, can consider Atarax prn before PT sessions assuming patient does not become too sedated/hypotensive.    Depression related to medical problems vs MDD -Continue Effexor XR 75 mg qd until 07/28/20 then 37.5 mg qd until 08/04/20 then stop. -Continue Prozac 10 mg until 07/28/20 then increase to Prozac 20 mg daily for depression.  -Continue Megace 400 mg daily for appetite stimulation -Continue Trazodone 25 mg at bedtime for sleep -Recommend outpatient psychiatry referral for med management upon discharge -Recommend Atarax/Vistaril 25 mg tid prn for anxiety -Recommend avoiding Ativan for patient as patient becomes too sedated with Ativan -will evaluate for progress Monday when pt has changes in medication.  Disposition: No evidence of imminent risk to self or others at present.   Patient does not meet criteria for psychiatric inpatient admission. Supportive therapy provided about ongoing stressors.  Thank you for the consult Park Pope, MD PGY1 Psychiatry Resident 07/26/2020 8:35 AM

## 2020-07-26 NOTE — TOC Progression Note (Signed)
Transition of Care (TOC) - Progression Note    Patient Details  Name: Sheila Andrade MRN: [id number] Date of Birth: 05/24/1935  Transition of Care (TOC) CM/SW Contact   F , RN Phone Number: 07/26/2020, 3:55 PM  Clinical Narrative:    Still no response on the appeal decision. Cm met with the patient and her daughter at the bedside. They are interested in SNF in [place], [place] area if denied for CIR.  [facility]: [phone] fax: [phone]--no beds but said to send referral (done) [facility]: [phone]---left a voicemail [facility]; [phone]--left a voicemail  No bed offers locally. TOC following.    Expected Discharge Plan: Skilled Nursing Facility Barriers to Discharge: SNF Pending bed offer  Expected Discharge Plan and Services Expected Discharge Plan: Skilled Nursing Facility   Discharge Planning Services: CM Consult Post Acute Care Choice: Skilled Nursing Facility Living arrangements for the past 2 months: Single Family Home                                       Social Determinants of Health (SDOH) Interventions    Readmission Risk Interventions No flowsheet data found.  

## 2020-07-26 NOTE — Progress Notes (Signed)
PROGRESS NOTE                                                                                                                                                                                                             Patient Demographics:    Sheila Andrade, is a 85 y.o. female, DOB - May 28, 1935, XVQ:[id number]  Outpatient Primary MD for the patient is Sheila Handing, MD    LOS - 10  Admit date - 07/14/2020    Chief Complaint  Patient presents with   Failure To Thrive   Fatigue       Brief Narrative (HPI from H&P)  - 85 year old female with history of depression, ongoing functional decline, fall with history of pelvic fracture, recent development of tremors and bradykinesia who was brought in for suicidal ideation, decreased oral intake and ongoing generalized deconditioning, was seen by psych and neurology also as she had ongoing functional decline with bradykinesia.  She was admitted to [facility] long hospital thereafter transferred to [facility] on 07/22/2020 for continued neurology follow-up.   Subjective:   Patient in bed, appears comfortable, denies any headache, no fever, no chest pain or pressure, no shortness of breath , no abdominal pain. No new focal weakness.    Assessment  & Plan :     Severe depression and suicidal ideation.  Seen by psych psych is following.  Currently not actively suicidal or homicidal, still mildly anxious, on Effexor and trazodone and will defer all psych issues to psychiatry directly, have requested them to follow on a daily basis.  2.  Failure to thrive and ongoing functional decline with decreased oral intake.  Supportive care, psych treatment as above.  Continue oral protein supplementation, oral intake has improved, she will require SNF/CIR depending on insurance approval.  3.  Poor balance, bradykinesia, hyperreflexia in both lower extremities.  MRI brain, C and L-spine unremarkable,  neurology is following, case discussed with neurologist on 07/23/2020 outpatient neurology work-up with continued PT OT , will need placement.  She is now medically stable for discharge await bed.  4.  Vitamin B12 deficiency.  Placed on replacement.  5.  Mild hyponatremia.  Resolved after IV fluids.  6.  Moderate to severe protein calorie malnutrition.  On supplements.  7.  Abnormal UA.  UTI ruled out clinically.  Condition -fair  Family Communication  : Message left for patient's daughter DC through patient's phone on 07/22/2020, daughter updated bedside on 07/23/2020  Sheila Andrade [phone] - 07/24/20   Code Status : Full  Consults  : Psychiatry, neurology  PUD Prophylaxis : None   Procedures  :     MRI Brain C&T Spine.  Nonacute.      Disposition Plan  :    Status is: Inpatient  Remains inpatient appropriate because:IV treatments appropriate due to intensity of illness or inability to take PO  Dispo: The patient is from: Home              Anticipated d/c is to: SNF              Patient currently is not medically stable to d/c.   Difficult to place patient No  DVT Prophylaxis  :    enoxaparin (LOVENOX) injection 40 mg Start: 07/21/20 1200 SCDs Start: 07/15/20 0036   Lab Results  Component Value Date   PLT 321 07/25/2020    Diet :  Diet Order             Diet regular Room service appropriate? Yes; Fluid consistency: Thin  Diet effective now                    Inpatient Medications  Scheduled Meds:  brinzolamide  1 drop Left Eye BID   enoxaparin (LOVENOX) injection  40 mg Subcutaneous Q24H   feeding supplement  237 mL Oral BID BM   FLUoxetine  10 mg Oral Daily   [START ON 07/28/2020] FLUoxetine  20 mg Oral Daily   latanoprost  1 drop Left Eye QHS   megestrol  400 mg Oral Daily   multivitamin with minerals  1 tablet Oral Daily   simvastatin  20 mg Oral Daily   traZODone  25 mg Oral QHS   [START ON 07/28/2020] venlafaxine XR  37.5 mg Oral Q  breakfast   venlafaxine XR  75 mg Oral Q breakfast   Continuous Infusions:   PRN Meds:.acetaminophen **OR** acetaminophen, hydrOXYzine, polyethylene glycol  Antibiotics  :    Anti-infectives (From admission, onward)    Start     Dose/Rate Route Frequency Ordered Stop   07/15/20 2000  cefTRIAXone (ROCEPHIN) 1 g in sodium chloride 0.9 % 100 mL IVPB  Status:  Discontinued        1 g 200 mL/hr over 30 Minutes Intravenous Every 24 hours 07/15/20 0000 07/15/20 1635   07/14/20 2315  cefTRIAXone (ROCEPHIN) 1 g in sodium chloride 0.9 % 100 mL IVPB        1 g 200 mL/hr over 30 Minutes Intravenous  Once 07/14/20 2307 07/15/20 0040        Time Spent in minutes  30   Susa Raring M.D on 07/26/2020 at 10:58 AM  To page go to [email or web address]   [facility] -  Office  [phone]  See all Orders from today for further details    Objective:   Vitals:   07/25/20 1527 07/25/20 2020 07/26/20 0403 07/26/20 0831  BP: 101/67 132/77 (!) 134/93 131/90  Pulse: 90 91 78 79  Resp: 17 16 16 18   Temp: 98.4 F (36.9 C) 98.6 F (37 C) 98 F (36.7 C)   TempSrc: Oral Oral Oral   SpO2: 96% 97% 98% 99%  Weight:      Height:        Wt Readings from Last 3 Encounters:  07/14/20  51.3 kg  05/31/20 54.4 kg  05/27/20 52.8 kg     Intake/Output Summary (Last 24 hours) at 07/26/2020 1058 Last data filed at 07/26/2020 0622 Gross per 24 hour  Intake 75 ml  Output 700 ml  Net -625 ml      Physical Exam  Mildly somnolent but waking up, alert x 2, bilateral lower extremity strength 4/5 with increased tone and hyperreflexia, normal affect [place].AT,PERRAL Supple Neck,No JVD, No cervical lymphadenopathy appriciated.  Symmetrical Chest wall movement, Good air movement bilaterally, CTAB RRR,No Gallops, Rubs or new Murmurs, No Parasternal Heave +ve B.Sounds, Abd Soft, No tenderness, No organomegaly appriciated, No rebound - guarding or rigidity. No Cyanosis, Clubbing or edema, No new Rash or  bruise    Data Review:    CBC Recent Labs  Lab 07/21/20 0544 07/22/20 0205 07/23/20 0337 07/24/20 0403 07/25/20 0102  WBC 6.0 6.4 6.3 6.6 6.9  HGB 12.6 10.3* 10.1* 10.8* 10.6*  HCT 40.1 32.4* 30.9* 33.3* 32.6*  PLT 286 297 291 296 321  MCV 89.5 89.0 88.3 88.3 87.9  MCH 28.1 28.3 28.9 28.6 28.6  MCHC 31.4 31.8 32.7 32.4 32.5  RDW 14.3 14.4 14.7 14.6 14.6  LYMPHSABS 1.6 1.6 2.0 2.0 1.9  MONOABS 0.6 0.6 0.6 0.6 0.6  EOSABS 0.1 0.1 0.1 0.1 0.1  BASOSABS 0.1 0.1 0.0 0.0 0.0    Recent Labs  Lab 07/21/20 0544 07/22/20 0205 07/22/20 1127 07/23/20 0337 07/24/20 0403 07/25/20 0102  NA 134* 135  --  137 134* 133*  K 3.8 3.6  --  3.8 3.9 4.3  CL 101 107  --  107 107 105  CO2 26 22  --  22 21* 21*  GLUCOSE 116* 146*  --  112* 101* 99  BUN 11 19  --  CREATININE 0.67 0.63  --  0.60 0.64 0.70  CALCIUM 9.1 8.9  --  8.5* 9.0 8.9  AST 19 13*  --  13* 14* 15  ALT 13 12  --  ALKPHOS 60 50  --  42 42 49  BILITOT 0.3 0.3  --  0.4 0.5 0.3  ALBUMIN 3.4* 2.8*  --  2.7* 2.8* 2.7*  MG 2.0 1.9  --  2.0 2.0 2.0  TSH  --   --  1.380  --   --   --   BNP  --   --   --  94.5 98.4 77.2    ------------------------------------------------------------------------------------------------------------------ No results for input(s): CHOL, HDL, LDLCALC, TRIG, CHOLHDL, LDLDIRECT in the last 72 hours.  No results found for: HGBA1C ------------------------------------------------------------------------------------------------------------------ No results for input(s): TSH, T4TOTAL, T3FREE, THYROIDAB in the last 72 hours.  Invalid input(s): FREET3   Cardiac Enzymes No results for input(s): CKMB, TROPONINI, MYOGLOBIN in the last 168 hours.  Invalid input(s): CK ------------------------------------------------------------------------------------------------------------------    Component Value Date/Time   BNP 77.2 07/25/2020 0102     Radiology Reports DG Chest 2  View  Result Date: 07/14/2020 CLINICAL DATA:  Failure to thrive, weight loss EXAM: CHEST - 2 VIEW COMPARISON:  Radiograph 05/31/2020 FINDINGS: Suspect a small calcified mediastinal lymph node seen on lateral radiograph. Remaining cardiomediastinal contours are unremarkable. Bandlike opacity in the right lung base, likely scarring given similar to prior. No consolidation, features of edema, pneumothorax, or effusion. Degenerative changes are present in the imaged spine and shoulders. Evidence of calcific tendinosis in the left shoulder. Left cervical carotid atherosclerosis. IMPRESSION: Suspect a small calcified mediastinal/hilar node. Can reflect  sequela of prior granulomatous disease. No acute cardiopulmonary abnormality. Electronically Signed   By: Kreg ShropshirePrice  DeHay M.D.   On: 07/14/2020 20:56   MR BRAIN WO CONTRAST  Result Date: 07/14/2020 CLINICAL DATA:  Mental status change. EXAM: MRI HEAD WITHOUT CONTRAST TECHNIQUE: Multiplanar, multiecho pulse sequences of the brain and surrounding structures were obtained without intravenous contrast. COMPARISON:  None. FINDINGS: Brain: No acute infarction, acute hemorrhage, hydrocephalus, extra-axial collection or mass lesion. Moderate scattered T2/FLAIR hyperintensities within the white matter, which are nonspecific but most likely related to chronic microvascular ischemic disease given patient age. Mild-to-moderate atrophy with ex vacuo ventricular dilation. The callosal angle is not acute and there is no crowding of sulci at the vertex. Dilated perivascular space in the inferior left basal ganglia. Small focus of susceptibility artifact in the lateral left cerebellum, most likely a prior microhemorrhage. Vascular: Major arterial flow voids are maintained skull base. Skull and upper cervical spine: Normal marrow signal. Sinuses/Orbits: Visualized sinuses are clear. No acute orbital findings. Other: Small left mastoid effusion. IMPRESSION: 1. No evidence of acute  intracranial abnormality 2. Moderate chronic microvascular ischemic disease and mild-to-moderate atrophy. 3. Small left mastoid effusion. Electronically Signed   By: Feliberto HartsFrederick S Jones MD   On: 07/14/2020 20:11   MR CERVICAL SPINE W WO CONTRAST  Result Date: 07/22/2020 CLINICAL DATA:  Hyper-reflexia and abnormal gait EXAM: MRI CERVICAL AND THORACIC SPINE WITHOUT AND WITH CONTRAST TECHNIQUE: Multiplanar and multiecho pulse sequences of the cervical spine, to include the craniocervical junction and cervicothoracic junction, and the thoracic spine, were obtained without and with intravenous contrast. CONTRAST:  5mL GADAVIST GADOBUTROL 1 MMOL/ML IV SOLN COMPARISON:  None. FINDINGS: MRI CERVICAL SPINE FINDINGS Alignment: Physiologic. Vertebrae: No fracture, evidence of discitis, or bone lesion. Cord: Normal signal and morphology. Posterior Fossa, vertebral arteries, paraspinal tissues: Negative. Disc levels: Axial images are severely motion degraded. C1-2: Unremarkable. C2-3: Normal disc space and facet joints. There is no spinal canal stenosis. No neural foraminal stenosis. C3-4: Normal disc space and facet joints. There is no spinal canal stenosis. No neural foraminal stenosis. C4-5: Normal disc space and facet joints. There is no spinal canal stenosis. No neural foraminal stenosis. C5-6: Small left asymmetric disc bulge. There is no spinal canal stenosis. Mild left neural foraminal stenosis. C6-7: Left asymmetric disc bulge with uncovertebral spurring. There is no spinal canal stenosis. Moderate left neural foraminal stenosis. C7-T1: Normal disc space and facet joints. There is no spinal canal stenosis. No neural foraminal stenosis. MRI THORACIC SPINE FINDINGS Alignment:  Physiologic. Vertebrae: No fracture, evidence of discitis, or bone lesion. Cord:  Normal signal and morphology. Paraspinal and other soft tissues: Negative. Disc levels: Severely motion degraded axial images. No spinal canal stenosis. There is a  small central disc protrusion noted at T7-8. No abnormal contrast enhancement. IMPRESSION: 1. Motion degraded study. 2. No acute abnormality of the cervical or thoracic spine. 3. Moderate left C6-7 and mild left C5-6 neural foraminal stenosis. 4. No spinal canal stenosis. Electronically Signed   By: Deatra RobinsonKevin  Herman M.D.   On: 07/22/2020 21:07   MR THORACIC SPINE W WO CONTRAST  Result Date: 07/22/2020 CLINICAL DATA:  Hyper-reflexia and abnormal gait EXAM: MRI CERVICAL AND THORACIC SPINE WITHOUT AND WITH CONTRAST TECHNIQUE: Multiplanar and multiecho pulse sequences of the cervical spine, to include the craniocervical junction and cervicothoracic junction, and the thoracic spine, were obtained without and with intravenous contrast. CONTRAST:  5mL GADAVIST GADOBUTROL 1 MMOL/ML IV SOLN COMPARISON:  None. FINDINGS: MRI CERVICAL SPINE FINDINGS  Alignment: Physiologic. Vertebrae: No fracture, evidence of discitis, or bone lesion. Cord: Normal signal and morphology. Posterior Fossa, vertebral arteries, paraspinal tissues: Negative. Disc levels: Axial images are severely motion degraded. C1-2: Unremarkable. C2-3: Normal disc space and facet joints. There is no spinal canal stenosis. No neural foraminal stenosis. C3-4: Normal disc space and facet joints. There is no spinal canal stenosis. No neural foraminal stenosis. C4-5: Normal disc space and facet joints. There is no spinal canal stenosis. No neural foraminal stenosis. C5-6: Small left asymmetric disc bulge. There is no spinal canal stenosis. Mild left neural foraminal stenosis. C6-7: Left asymmetric disc bulge with uncovertebral spurring. There is no spinal canal stenosis. Moderate left neural foraminal stenosis. C7-T1: Normal disc space and facet joints. There is no spinal canal stenosis. No neural foraminal stenosis. MRI THORACIC SPINE FINDINGS Alignment:  Physiologic. Vertebrae: No fracture, evidence of discitis, or bone lesion. Cord:  Normal signal and morphology.  Paraspinal and other soft tissues: Negative. Disc levels: Severely motion degraded axial images. No spinal canal stenosis. There is a small central disc protrusion noted at T7-8. No abnormal contrast enhancement. IMPRESSION: 1. Motion degraded study. 2. No acute abnormality of the cervical or thoracic spine. 3. Moderate left C6-7 and mild left C5-6 neural foraminal stenosis. 4. No spinal canal stenosis. Electronically Signed   By: Deatra Robinson M.D.   On: 07/22/2020 21:07   DG Hip Unilat W or Wo Pelvis 2-3 Views Left  Result Date: 07/14/2020 CLINICAL DATA:  Failure to thrive and weight loss EXAM: DG HIP (WITH OR WITHOUT PELVIS) 2-3V LEFT COMPARISON:  01/01/2020 FINDINGS: Pelvic ring is intact. Old healed pubic rami fractures are noted on the left stable from prior CT examination. Degenerative changes of the hip joints are noted right greater than left. No acute fracture or dislocation in the left hip is noted. Some widening of the left sacroiliac joint is noted which appears more prominent than that seen on prior CT. IMPRESSION: Slight widening of the left sacroiliac joint which may be projectional in nature. Degenerative changes of the hip joints bilaterally right greater than left. No acute fracture is seen. Old fractures in the left pubic rami are seen. Electronically Signed   By: Alcide Clever M.D.   On: 07/14/2020 20:56

## 2020-07-26 NOTE — Progress Notes (Signed)
Inpatient Rehab Admissions Coordinator:   I continue to await decision on insurance appeal for CIR. As of 10 am this morning, case was still pending review.  Pt. And MD updated   Megan Salon, MS, CCC-SLP Rehab Admissions Coordinator  [phone] (celll) [phone] (office)

## 2020-07-27 DIAGNOSIS — F332 Major depressive disorder, recurrent severe without psychotic features: Secondary | ICD-10-CM | POA: Diagnosis not present

## 2020-07-27 NOTE — Progress Notes (Signed)
PROGRESS NOTE                                                                                                                                                                                                             Patient Demographics:    Sheila Andrade, is a 85 y.o. female, DOB - 1935/02/13, CVE:[id number]  Outpatient Primary MD for the patient is Dalphine Handing, MD    LOS - 11  Admit date - 07/14/2020    Chief Complaint  Patient presents with   Failure To Thrive   Fatigue       Brief Narrative (HPI from H&P)  - 85 year old female with history of depression, ongoing functional decline, fall with history of pelvic fracture, recent development of tremors and bradykinesia who was brought in for suicidal ideation, decreased oral intake and ongoing generalized deconditioning, was seen by psych and neurology also as she had ongoing functional decline with bradykinesia.  She was admitted to [facility] long hospital thereafter transferred to [facility] on 07/22/2020 for continued neurology follow-up.   Subjective:   Patient in bed, appears comfortable, denies any headache, no fever, no chest pain or pressure, no shortness of breath , no abdominal pain. No new focal weakness.   Assessment  & Plan :     Severe depression and suicidal ideation.  Seen by psych psych is following.  Currently not actively suicidal or homicidal, still mildly anxious, on Effexor and trazodone and will defer all psych issues to psychiatry directly, have requested them to follow on a daily basis.  2.  Failure to thrive and ongoing functional decline with decreased oral intake.  Supportive care, psych treatment as above.  Continue oral protein supplementation, oral intake has improved, she will require SNF/CIR depending on insurance approval.  3.  Poor balance, bradykinesia, hyperreflexia in both lower extremities.  MRI brain, C and L-spine unremarkable,  neurology is following, case discussed with neurologist on 07/23/2020 outpatient neurology work-up with continued PT OT , will need placement.  She is now medically stable for discharge await bed.  4.  Vitamin B12 deficiency.  Placed on replacement.  5.  Mild hyponatremia.  Resolved after IV fluids.  6.  Moderate to severe protein calorie malnutrition.  On supplements.  7.  Abnormal UA.  UTI ruled out clinically.  Condition -fair  Family Communication  :   Message left for patient's daughter DC through patient's phone on 07/22/2020, daughter updated bedside on 07/23/2020  Corrie Dandy [phone] - 07/24/20  [name] [phone] on 07/27/20 message left at 11:26 AM   Code Status : Full  Consults  : Psychiatry, neurology  PUD Prophylaxis : None   Procedures  :     MRI Brain C&T Spine.  Nonacute.      Disposition Plan  :    Status is: Inpatient  Remains inpatient appropriate because:IV treatments appropriate due to intensity of illness or inability to take PO  Dispo: The patient is from: Home              Anticipated d/c is to: SNF              Patient currently is not medically stable to d/c.   Difficult to place patient No  DVT Prophylaxis  :    enoxaparin (LOVENOX) injection 40 mg Start: 07/21/20 1200 SCDs Start: 07/15/20 0036   Lab Results  Component Value Date   PLT 321 07/25/2020    Diet :  Diet Order             Diet regular Room service appropriate? Yes; Fluid consistency: Thin  Diet effective now                    Inpatient Medications  Scheduled Meds:  brinzolamide  1 drop Left Eye BID   enoxaparin (LOVENOX) injection  40 mg Subcutaneous Q24H   feeding supplement  237 mL Oral BID BM   [START ON 07/28/2020] FLUoxetine  20 mg Oral Daily   latanoprost  1 drop Left Eye QHS   megestrol  400 mg Oral Daily   multivitamin with minerals  1 tablet Oral Daily   simvastatin  20 mg Oral Daily   traZODone  25 mg Oral QHS   [START ON 07/28/2020]  venlafaxine XR  37.5 mg Oral Q breakfast   Continuous Infusions:   PRN Meds:.acetaminophen **OR** acetaminophen, hydrOXYzine, polyethylene glycol  Antibiotics  :    Anti-infectives (From admission, onward)    Start     Dose/Rate Route Frequency Ordered Stop   07/15/20 2000  cefTRIAXone (ROCEPHIN) 1 g in sodium chloride 0.9 % 100 mL IVPB  Status:  Discontinued        1 g 200 mL/hr over 30 Minutes Intravenous Every 24 hours 07/15/20 0000 07/15/20 1635   07/14/20 2315  cefTRIAXone (ROCEPHIN) 1 g in sodium chloride 0.9 % 100 mL IVPB        1 g 200 mL/hr over 30 Minutes Intravenous  Once 07/14/20 2307 07/15/20 0040        Time Spent in minutes  30   Susa Raring M.D on 07/27/2020 at 11:26 AM  To page go to [email or web address]   [facility] -  Office  [phone]  See all Orders from today for further details    Objective:   Vitals:   07/26/20 1947 07/26/20 2321 07/27/20 0409 07/27/20 0749  BP: 120/65 109/61 96/73 134/73  Pulse: 87 78 82 74  Resp: 16 16 16 18   Temp: 98.1 F (36.7 C) 98.1 F (36.7 C) 98.2 F (36.8 C) 98.6 F (37 C)  TempSrc: Oral Oral Oral Oral  SpO2: 100% 98% 96% 97%  Weight:      Height:        Wt Readings from Last 3 Encounters:  07/14/20 51.3 kg  05/31/20  54.4 kg  05/27/20 52.8 kg     Intake/Output Summary (Last 24 hours) at 07/27/2020 1126 Last data filed at 07/27/2020 0410 Gross per 24 hour  Intake --  Output 850 ml  Net -850 ml      Physical Exam  Mildly somnolent but waking up, alert x 2, bilateral lower extremity strength 4/5 with increased tone and hyperreflexia, normal affect [place].AT,PERRAL Supple Neck,No JVD, No cervical lymphadenopathy appriciated.  Symmetrical Chest wall movement, Good air movement bilaterally, CTAB RRR,No Gallops, Rubs or new Murmurs, No Parasternal Heave +ve B.Sounds, Abd Soft, No tenderness, No organomegaly appriciated, No rebound - guarding or rigidity. No Cyanosis.     Data Review:     CBC Recent Labs  Lab 07/21/20 0544 07/22/20 0205 07/23/20 0337 07/24/20 0403 07/25/20 0102  WBC 6.0 6.4 6.3 6.6 6.9  HGB 12.6 10.3* 10.1* 10.8* 10.6*  HCT 40.1 32.4* 30.9* 33.3* 32.6*  PLT 286 297 291 296 321  MCV 89.5 89.0 88.3 88.3 87.9  MCH 28.1 28.3 28.9 28.6 28.6  MCHC 31.4 31.8 32.7 32.4 32.5  RDW 14.3 14.4 14.7 14.6 14.6  LYMPHSABS 1.6 1.6 2.0 2.0 1.9  MONOABS 0.6 0.6 0.6 0.6 0.6  EOSABS 0.1 0.1 0.1 0.1 0.1  BASOSABS 0.1 0.1 0.0 0.0 0.0    Recent Labs  Lab 07/21/20 0544 07/22/20 0205 07/22/20 1127 07/23/20 0337 07/24/20 0403 07/25/20 0102  NA 134* 135  --  137 134* 133*  K 3.8 3.6  --  3.8 3.9 4.3  CL 101 107  --  107 107 105  CO2 26 22  --  22 21* 21*  GLUCOSE 116* 146*  --  112* 101* 99  BUN 11 19  --  CREATININE 0.67 0.63  --  0.60 0.64 0.70  CALCIUM 9.1 8.9  --  8.5* 9.0 8.9  AST 19 13*  --  13* 14* 15  ALT 13 12  --  ALKPHOS 60 50  --  42 42 49  BILITOT 0.3 0.3  --  0.4 0.5 0.3  ALBUMIN 3.4* 2.8*  --  2.7* 2.8* 2.7*  MG 2.0 1.9  --  2.0 2.0 2.0  TSH  --   --  1.380  --   --   --   BNP  --   --   --  94.5 98.4 77.2    ------------------------------------------------------------------------------------------------------------------ No results for input(s): CHOL, HDL, LDLCALC, TRIG, CHOLHDL, LDLDIRECT in the last 72 hours.  No results found for: HGBA1C ------------------------------------------------------------------------------------------------------------------ No results for input(s): TSH, T4TOTAL, T3FREE, THYROIDAB in the last 72 hours.  Invalid input(s): FREET3   Cardiac Enzymes No results for input(s): CKMB, TROPONINI, MYOGLOBIN in the last 168 hours.  Invalid input(s): CK ------------------------------------------------------------------------------------------------------------------    Component Value Date/Time   BNP 77.2 07/25/2020 0102     Radiology Reports DG Chest 2 View  Result Date:  07/14/2020 CLINICAL DATA:  Failure to thrive, weight loss EXAM: CHEST - 2 VIEW COMPARISON:  Radiograph 05/31/2020 FINDINGS: Suspect a small calcified mediastinal lymph node seen on lateral radiograph. Remaining cardiomediastinal contours are unremarkable. Bandlike opacity in the right lung base, likely scarring given similar to prior. No consolidation, features of edema, pneumothorax, or effusion. Degenerative changes are present in the imaged spine and shoulders. Evidence of calcific tendinosis in the left shoulder. Left cervical carotid atherosclerosis. IMPRESSION: Suspect a small calcified mediastinal/hilar node. Can reflect sequela of prior granulomatous disease. No acute cardiopulmonary abnormality. Electronically Signed  By: Kreg Shropshire M.D.   On: 07/14/2020 20:56   MR BRAIN WO CONTRAST  Result Date: 07/14/2020 CLINICAL DATA:  Mental status change. EXAM: MRI HEAD WITHOUT CONTRAST TECHNIQUE: Multiplanar, multiecho pulse sequences of the brain and surrounding structures were obtained without intravenous contrast. COMPARISON:  None. FINDINGS: Brain: No acute infarction, acute hemorrhage, hydrocephalus, extra-axial collection or mass lesion. Moderate scattered T2/FLAIR hyperintensities within the white matter, which are nonspecific but most likely related to chronic microvascular ischemic disease given patient age. Mild-to-moderate atrophy with ex vacuo ventricular dilation. The callosal angle is not acute and there is no crowding of sulci at the vertex. Dilated perivascular space in the inferior left basal ganglia. Small focus of susceptibility artifact in the lateral left cerebellum, most likely a prior microhemorrhage. Vascular: Major arterial flow voids are maintained skull base. Skull and upper cervical spine: Normal marrow signal. Sinuses/Orbits: Visualized sinuses are clear. No acute orbital findings. Other: Small left mastoid effusion. IMPRESSION: 1. No evidence of acute intracranial abnormality 2.  Moderate chronic microvascular ischemic disease and mild-to-moderate atrophy. 3. Small left mastoid effusion. Electronically Signed   By: Feliberto Harts MD   On: 07/14/2020 20:11   MR CERVICAL SPINE W WO CONTRAST  Result Date: 07/22/2020 CLINICAL DATA:  Hyper-reflexia and abnormal gait EXAM: MRI CERVICAL AND THORACIC SPINE WITHOUT AND WITH CONTRAST TECHNIQUE: Multiplanar and multiecho pulse sequences of the cervical spine, to include the craniocervical junction and cervicothoracic junction, and the thoracic spine, were obtained without and with intravenous contrast. CONTRAST:  61mL GADAVIST GADOBUTROL 1 MMOL/ML IV SOLN COMPARISON:  None. FINDINGS: MRI CERVICAL SPINE FINDINGS Alignment: Physiologic. Vertebrae: No fracture, evidence of discitis, or bone lesion. Cord: Normal signal and morphology. Posterior Fossa, vertebral arteries, paraspinal tissues: Negative. Disc levels: Axial images are severely motion degraded. C1-2: Unremarkable. C2-3: Normal disc space and facet joints. There is no spinal canal stenosis. No neural foraminal stenosis. C3-4: Normal disc space and facet joints. There is no spinal canal stenosis. No neural foraminal stenosis. C4-5: Normal disc space and facet joints. There is no spinal canal stenosis. No neural foraminal stenosis. C5-6: Small left asymmetric disc bulge. There is no spinal canal stenosis. Mild left neural foraminal stenosis. C6-7: Left asymmetric disc bulge with uncovertebral spurring. There is no spinal canal stenosis. Moderate left neural foraminal stenosis. C7-T1: Normal disc space and facet joints. There is no spinal canal stenosis. No neural foraminal stenosis. MRI THORACIC SPINE FINDINGS Alignment:  Physiologic. Vertebrae: No fracture, evidence of discitis, or bone lesion. Cord:  Normal signal and morphology. Paraspinal and other soft tissues: Negative. Disc levels: Severely motion degraded axial images. No spinal canal stenosis. There is a small central disc protrusion  noted at T7-8. No abnormal contrast enhancement. IMPRESSION: 1. Motion degraded study. 2. No acute abnormality of the cervical or thoracic spine. 3. Moderate left C6-7 and mild left C5-6 neural foraminal stenosis. 4. No spinal canal stenosis. Electronically Signed   By: Deatra Robinson M.D.   On: 07/22/2020 21:07   MR THORACIC SPINE W WO CONTRAST  Result Date: 07/22/2020 CLINICAL DATA:  Hyper-reflexia and abnormal gait EXAM: MRI CERVICAL AND THORACIC SPINE WITHOUT AND WITH CONTRAST TECHNIQUE: Multiplanar and multiecho pulse sequences of the cervical spine, to include the craniocervical junction and cervicothoracic junction, and the thoracic spine, were obtained without and with intravenous contrast. CONTRAST:  82mL GADAVIST GADOBUTROL 1 MMOL/ML IV SOLN COMPARISON:  None. FINDINGS: MRI CERVICAL SPINE FINDINGS Alignment: Physiologic. Vertebrae: No fracture, evidence of discitis, or bone lesion. Cord: Normal  signal and morphology. Posterior Fossa, vertebral arteries, paraspinal tissues: Negative. Disc levels: Axial images are severely motion degraded. C1-2: Unremarkable. C2-3: Normal disc space and facet joints. There is no spinal canal stenosis. No neural foraminal stenosis. C3-4: Normal disc space and facet joints. There is no spinal canal stenosis. No neural foraminal stenosis. C4-5: Normal disc space and facet joints. There is no spinal canal stenosis. No neural foraminal stenosis. C5-6: Small left asymmetric disc bulge. There is no spinal canal stenosis. Mild left neural foraminal stenosis. C6-7: Left asymmetric disc bulge with uncovertebral spurring. There is no spinal canal stenosis. Moderate left neural foraminal stenosis. C7-T1: Normal disc space and facet joints. There is no spinal canal stenosis. No neural foraminal stenosis. MRI THORACIC SPINE FINDINGS Alignment:  Physiologic. Vertebrae: No fracture, evidence of discitis, or bone lesion. Cord:  Normal signal and morphology. Paraspinal and other soft  tissues: Negative. Disc levels: Severely motion degraded axial images. No spinal canal stenosis. There is a small central disc protrusion noted at T7-8. No abnormal contrast enhancement. IMPRESSION: 1. Motion degraded study. 2. No acute abnormality of the cervical or thoracic spine. 3. Moderate left C6-7 and mild left C5-6 neural foraminal stenosis. 4. No spinal canal stenosis. Electronically Signed   By: Deatra RobinsonKevin  Herman M.D.   On: 07/22/2020 21:07   DG Hip Unilat W or Wo Pelvis 2-3 Views Left  Result Date: 07/14/2020 CLINICAL DATA:  Failure to thrive and weight loss EXAM: DG HIP (WITH OR WITHOUT PELVIS) 2-3V LEFT COMPARISON:  01/01/2020 FINDINGS: Pelvic ring is intact. Old healed pubic rami fractures are noted on the left stable from prior CT examination. Degenerative changes of the hip joints are noted right greater than left. No acute fracture or dislocation in the left hip is noted. Some widening of the left sacroiliac joint is noted which appears more prominent than that seen on prior CT. IMPRESSION: Slight widening of the left sacroiliac joint which may be projectional in nature. Degenerative changes of the hip joints bilaterally right greater than left. No acute fracture is seen. Old fractures in the left pubic rami are seen. Electronically Signed   By: Alcide CleverMark  Lukens M.D.   On: 07/14/2020 20:56

## 2020-07-27 NOTE — Plan of Care (Signed)
Awaiting placement: CIR vs. SNF. Medically stable per MD.   Problem: Education: Goal: Knowledge of General Education information will improve Description: Including pain rating scale, medication(s)/side effects and non-pharmacologic comfort measures Outcome: Progressing   Problem: Health Behavior/Discharge Planning: Goal: Ability to manage health-related needs will improve Outcome: Progressing   Problem: Clinical Measurements: Goal: Ability to maintain clinical measurements within normal limits will improve Outcome: Progressing Goal: Will remain free from infection Outcome: Progressing Goal: Diagnostic test results will improve Outcome: Progressing Goal: Respiratory complications will improve Outcome: Progressing Goal: Cardiovascular complication will be avoided Outcome: Progressing   Problem: Activity: Goal: Risk for activity intolerance will decrease Outcome: Progressing   Problem: Nutrition: Goal: Adequate nutrition will be maintained Outcome: Progressing   Problem: Coping: Goal: Level of anxiety will decrease Outcome: Progressing   Problem: Elimination: Goal: Will not experience complications related to bowel motility Outcome: Progressing Goal: Will not experience complications related to urinary retention Outcome: Progressing   Problem: Pain Managment: Goal: General experience of comfort will improve Outcome: Progressing   Problem: Safety: Goal: Ability to remain free from injury will improve Outcome: Progressing   Problem: Skin Integrity: Goal: Risk for impaired skin integrity will decrease Outcome: Progressing

## 2020-07-27 NOTE — Progress Notes (Signed)
Physical Therapy Treatment Patient Details Name: Sheila Andrade MRN: [id number] DOB: 1935/01/10 Today's Date: 07/27/2020    History of Present Illness 85 y.o. female adm with recurrent UTI PMH:  recent COVID, glaucoma osteoporosis, HLD, depression . Pysch consulted    PT Comments    Pt is making significant progress towards her PT goals, displaying ability to ambulate short distances (up to ~10 ft) in her room with a RW and modA, +2 for safety, this date. She continues to display a strong posterior lean, likely due to her high fear of falling, but is able to progress from needing maxA to prevent posterior LOB to needing as little as minA in static standing once pt extends hips and pushes "belly out" to facilitate an anterior lean. Cues provided for set-up for transfers with pt displaying difficulty scooting hips anteriorly to an edge of a surface. Will continue to follow acutely. Current recommendations remain appropriate.   Follow Up Recommendations  CIR     Equipment Recommendations  None recommended by PT    Recommendations for Other Services       Precautions / Restrictions Precautions Precautions: Fall Restrictions Weight Bearing Restrictions: No    Mobility  Bed Mobility Overal bed mobility: Needs Assistance Bed Mobility: Supine to Sit     Supine to sit: Min assist;HOB elevated     General bed mobility comments: Cued pt to use rails to assist with pulling self to side of bed, but needing minA to pivot buttocks to allow legs to come off EOB. Extra time to scoot buttocks closer to EOB once sitting.    Transfers Overall transfer level: Needs assistance Equipment used: Rolling walker (2 wheeled) Transfers: Sit to/from [employer] Sit to Stand: Mod assist;+2 safety/equipment;Max assist Stand pivot transfers: Mod assist;+2 safety/equipment       General transfer comment: Cues provided to bring buttocks close to edge of surface pt is rising from and to  push up with at least 1 hand and then transition to RW, modA 1x from elevated EOB and maxA 1x from recliner. Pt with strong posterior lean, avoiding extending hips, needing physical assistance to bring hips into extension and cues to push belly anteriorly (stick it out) to decrease posterior lean. +2 for safety from sitter.  Ambulation/Gait Ambulation/Gait assistance: Mod assist;+2 safety/equipment Gait Distance (Feet): 10 Feet (x2 bouts of ~6 ft > ~10 ft) Assistive device: Rolling walker (2 wheeled) Gait Pattern/deviations: Decreased step length - right;Decreased step length - left;Shuffle;Leaning posteriorly;Trunk flexed;Decreased stride length;Step-through pattern Gait velocity: decr Gait velocity interpretation: <1.8 ft/sec, indicate of risk for recurrent falls General Gait Details: Pt with posterior lean and hips flexed, needing cues to look superiorly, extend hips, and "stick out belly" to correct, with noted improvements but still needing assistance to avoid posterior LOB. PT pulling RW forward to facilitate anterior lean to catch up to RW, noted success. Shuffling gait pattern. ModA to steady, +2 from sitter with chair follow for safety.   Stairs             Wheelchair Mobility    Modified Rankin (Stroke Patients Only)       Balance Overall balance assessment: Needs assistance Sitting-balance support: Feet supported Sitting balance-Leahy Scale: Fair Sitting balance - Comments: Leaning posteriorly, min guard.   Standing balance support: Bilateral upper extremity supported;During functional activity Standing balance-Leahy Scale: Poor Standing balance comment: Static standing progressing from maxA > minA as pt's posterior lean decreased with cues to look superiorly, extend hips, and bring belly  forward and weight through toes. ModA for ambulating with RW.                            Cognition Arousal/Alertness: Awake/alert Behavior During Therapy:  Anxious Overall Cognitive Status: Impaired/Different from baseline Area of Impairment: Following commands;Safety/judgement;Problem solving;Awareness;Attention;Memory;Orientation                 Orientation Level: Disoriented to;Situation Current Attention Level: Sustained Memory: Decreased short-term memory Following Commands: Follows one step commands with increased time;Follows one step commands consistently Safety/Judgement: Decreased awareness of safety;Decreased awareness of deficits Awareness: Emergent Problem Solving: Slow processing;Requires verbal cues;Requires tactile cues;Difficulty sequencing General Comments: A&Ox3, stated situation was that she was brought here because she broke almost every bone in her body. Pt anxious in regards to falling, needing encouragement throughout.      Exercises      General Comments        Pertinent Vitals/Pain Pain Assessment: Faces Faces Pain Scale: Hurts a little bit Pain Location: privates with adjustment of purewick Pain Descriptors / Indicators: Discomfort;Guarding Pain Intervention(s): Repositioned    Home Living                      Prior Function            PT Goals (current goals can now be found in the care plan section) Acute Rehab PT Goals Patient Stated Goal: to walk with a RW again PT Goal Formulation: With patient Time For Goal Achievement: 07/29/20 Potential to Achieve Goals: Good Progress towards PT goals: Progressing toward goals    Frequency    Min 3X/week      PT Plan Current plan remains appropriate    Co-evaluation              AM-PAC PT "6 Clicks" Mobility   Outcome Measure  Help needed turning from your back to your side while in a flat bed without using bedrails?: A Little Help needed moving from lying on your back to sitting on the side of a flat bed without using bedrails?: A Little Help needed moving to and from a bed to a chair (including a wheelchair)?: A  Lot Help needed standing up from a chair using your arms (e.g., wheelchair or bedside chair)?: A Lot Help needed to walk in hospital room?: A Lot Help needed climbing 3-5 steps with a railing? : Total 6 Click Score: 13    End of Session Equipment Utilized During Treatment: Gait belt Activity Tolerance: Patient tolerated treatment well Patient left: in chair;with call bell/phone within reach;with chair alarm set;with nursing/sitter in room Nurse Communication: Mobility status;Other (comment) (chair not pulling up fully from reclined position) PT Visit Diagnosis: Other abnormalities of gait and mobility (R26.89);Difficulty in walking, not elsewhere classified (R26.2);Unsteadiness on feet (R26.81);Muscle weakness (generalized) (M62.81)     Time: 7408-1448 PT Time Calculation (min) (ACUTE ONLY): 29 min  Charges:  $Gait Training: 8-22 mins $Therapeutic Activity: 8-22 mins                     Raymond Gurney, PT, DPT Acute Rehabilitation Services  Pager: [phone] Office: [phone]    Jewel Baize 07/27/2020, 3:08 PM

## 2020-07-27 NOTE — Progress Notes (Signed)
Inpatient Rehab Admissions Coordinator:  Annice Pih from [facility] called and left message that denial was upheld after expedited appeal. Informed pt.  Called pt's daughter, Corrie Dandy and left message. TOC also made aware.   Wolfgang Phoenix, MS, CCC-SLP Admissions Coordinator [phone]

## 2020-07-28 DIAGNOSIS — E43 Unspecified severe protein-calorie malnutrition: Secondary | ICD-10-CM | POA: Insufficient documentation

## 2020-07-28 DIAGNOSIS — F332 Major depressive disorder, recurrent severe without psychotic features: Secondary | ICD-10-CM | POA: Diagnosis not present

## 2020-07-28 LAB — CBC
HCT: 32.6 % — ABNORMAL LOW (ref 36.0–46.0)
Hemoglobin: 10.6 g/dL — ABNORMAL LOW (ref 12.0–15.0)
MCH: 28.9 pg (ref 26.0–34.0)
MCHC: 32.5 g/dL (ref 30.0–36.0)
MCV: 88.8 fL (ref 80.0–100.0)
Platelets: 341 10*3/uL (ref 150–400)
RBC: 3.67 MIL/uL — ABNORMAL LOW (ref 3.87–5.11)
RDW: 14.7 % (ref 11.5–15.5)
WBC: 7.1 10*3/uL (ref 4.0–10.5)
nRBC: 0 % (ref 0.0–0.2)

## 2020-07-28 LAB — BASIC METABOLIC PANEL
Anion gap: 6 (ref 5–15)
BUN: 24 mg/dL — ABNORMAL HIGH (ref 8–23)
CO2: 23 mmol/L (ref 22–32)
Calcium: 9.1 mg/dL (ref 8.9–10.3)
Chloride: 104 mmol/L (ref 98–111)
Creatinine, Ser: 0.78 mg/dL (ref 0.44–1.00)
GFR, Estimated: >60 mL/min (ref >60)
Glucose, Bld: 105 mg/dL — ABNORMAL HIGH (ref 70–99)
Potassium: 4.5 mmol/L (ref 3.5–5.1)
Sodium: 133 mmol/L — ABNORMAL LOW (ref 135–145)

## 2020-07-28 LAB — MAGNESIUM: Magnesium: 2.1 mg/dL (ref 1.7–2.4)

## 2020-07-28 NOTE — Progress Notes (Signed)
PROGRESS NOTE                                                                                                                                                                                                             Patient Demographics:    Sheila Andrade, is a 85 y.o. female, DOB - September 03, 1935, WUJ:[id number]  Outpatient Primary MD for the patient is Dalphine Handing, MD    LOS - 12  Admit date - 07/14/2020    Chief Complaint  Patient presents with   Failure To Thrive   Fatigue       Brief Narrative (HPI from H&P)  - 85 year old female with history of depression, ongoing functional decline, fall with history of pelvic fracture, recent development of tremors and bradykinesia who was brought in for suicidal ideation, decreased oral intake and ongoing generalized deconditioning, was seen by psych and neurology also as she had ongoing functional decline with bradykinesia.  She was admitted to [facility] thereafter transferred to [facility] on 07/22/2020 for continued neurology follow-up.   Subjective:   Patient in bed, appears comfortable, denies any headache, no fever, no chest pain or pressure, no shortness of breath , no abdominal pain. No new focal weakness.    Assessment  & Plan :     Severe depression and suicidal ideation.  Seen by psych psych is following.  Currently not actively suicidal or homicidal, still mildly anxious, on Effexor and trazodone and will defer all psych issues to psychiatry directly, have requested them to follow on a daily basis.  2.  Failure to thrive and ongoing functional decline with decreased oral intake.  Supportive care, psych treatment as above.  Continue oral protein supplementation, oral intake has improved, she will require SNF/CIR depending on insurance approval.  3.  Poor balance, bradykinesia, hyperreflexia in both lower extremities.  MRI brain, C and L-spine unremarkable,  neurology is following, case discussed with neurologist on 07/23/2020 outpatient neurology work-up with continued PT OT , will need placement.  She is now medically stable for discharge await bed.  4.  Vitamin B12 deficiency.  Placed on replacement.  5.  Mild hyponatremia.  Resolved after IV fluids.  6.  Moderate to severe protein calorie malnutrition.  On supplements.  7.  Abnormal UA.  UTI ruled out clinically.  Condition - Fair  Family Communication  :   Message left for patient's daughter DC through patient's phone on 07/22/2020, daughter updated bedside on 07/23/2020  Corrie Dandy [phone] - 07/24/20  [name] [phone] on 07/27/20 message left at 11:26 AM  Marisue Ivan [phone] on 07/28/20 at 11.19 am message left    Code Status : Full  Consults  : Psychiatry, neurology  PUD Prophylaxis : None   Procedures  :     MRI Brain C&T Spine.  Nonacute.      Disposition Plan  :    Status is: Inpatient  Remains inpatient appropriate because:IV treatments appropriate due to intensity of illness or inability to take PO  Dispo: The patient is from: Home              Anticipated d/c is to: SNF              Patient currently is not medically stable to d/c.   Difficult to place patient No  DVT Prophylaxis  :    enoxaparin (LOVENOX) injection 40 mg Start: 07/21/20 1200 SCDs Start: 07/15/20 0036   Lab Results  Component Value Date   PLT 341 07/28/2020    Diet :  Diet Order             Diet regular Room service appropriate? Yes; Fluid consistency: Thin  Diet effective now                    Inpatient Medications  Scheduled Meds:  brinzolamide  1 drop Left Eye BID   enoxaparin (LOVENOX) injection  40 mg Subcutaneous Q24H   feeding supplement  237 mL Oral BID BM   FLUoxetine  20 mg Oral Daily   latanoprost  1 drop Left Eye QHS   megestrol  400 mg Oral Daily   multivitamin with minerals  1 tablet Oral Daily   simvastatin  20 mg Oral Daily   traZODone  25  mg Oral QHS   venlafaxine XR  37.5 mg Oral Q breakfast   Continuous Infusions:   PRN Meds:.acetaminophen **OR** acetaminophen, hydrOXYzine, polyethylene glycol  Antibiotics  :    Anti-infectives (From admission, onward)    Start     Dose/Rate Route Frequency Ordered Stop   07/15/20 2000  cefTRIAXone (ROCEPHIN) 1 g in sodium chloride 0.9 % 100 mL IVPB  Status:  Discontinued        1 g 200 mL/hr over 30 Minutes Intravenous Every 24 hours 07/15/20 0000 07/15/20 1635   07/14/20 2315  cefTRIAXone (ROCEPHIN) 1 g in sodium chloride 0.9 % 100 mL IVPB        1 g 200 mL/hr over 30 Minutes Intravenous  Once 07/14/20 2307 07/15/20 0040        Time Spent in minutes  30   Susa Raring M.D on 07/28/2020 at 11:12 AM  To page go to [email or web address]   [facility] -  Office  [phone]  See all Orders from today for further details    Objective:   Vitals:   07/27/20 2033 07/28/20 0046 07/28/20 0742 07/28/20 0743  BP: 124/73 130/73 125/83   Pulse: 96 83 77   Resp: 18 18 18    Temp: 98 F (36.7 C) 97.7 F (36.5 C) 98 F (36.7 C)   TempSrc: Oral Oral  Oral  SpO2: 98% 96% 97%   Weight:      Height:        Wt Readings from Last 3 Encounters:  07/14/20 51.3  kg  05/31/20 54.4 kg  05/27/20 52.8 kg     Intake/Output Summary (Last 24 hours) at 07/28/2020 1112 Last data filed at 07/28/2020 0646 Gross per 24 hour  Intake --  Output 300 ml  Net -300 ml      Physical Exam  Mildly somnolent but waking up, alert x 2, bilateral lower extremity strength 4/5 with increased tone and hyperreflexia, normal affect [place].AT,PERRAL Supple Neck,No JVD, No cervical lymphadenopathy appriciated.  Symmetrical Chest wall movement, Good air movement bilaterally, CTAB RRR,No Gallops, Rubs or new Murmurs, No Parasternal Heave +ve B.Sounds, Abd Soft, No tenderness, No organomegaly appriciated, No rebound - guarding or rigidity. No Cyanosis, Clubbing or edema, No new Rash or bruise      Data Review:    CBC Recent Labs  Lab 07/22/20 0205 07/23/20 0337 07/24/20 0403 07/25/20 0102 07/28/20 0153  WBC 6.4 6.3 6.6 6.9 7.1  HGB 10.3* 10.1* 10.8* 10.6* 10.6*  HCT 32.4* 30.9* 33.3* 32.6* 32.6*  PLT 297 291 296 321 341  MCV 89.0 88.3 88.3 87.9 88.8  MCH 28.3 28.9 28.6 28.6 28.9  MCHC 31.8 32.7 32.4 32.5 32.5  RDW 14.4 14.7 14.6 14.6 14.7  LYMPHSABS 1.6 2.0 2.0 1.9  --   MONOABS 0.6 0.6 0.6 0.6  --   EOSABS 0.1 0.1 0.1 0.1  --   BASOSABS 0.1 0.0 0.0 0.0  --     Recent Labs  Lab 07/22/20 0205 07/22/20 1127 07/23/20 0337 07/24/20 0403 07/25/20 0102 07/28/20 0153  NA 135  --  137 134* 133* 133*  K 3.6  --  3.8 3.9 4.3 4.5  CL 107  --  107 107 105 104  CO2 22  --  22 21* 21* 23  GLUCOSE 146*  --  112* 101* 99 105*  BUN 19  --  16 12 17  24*  CREATININE 0.63  --  0.60 0.64 0.70 0.78  CALCIUM 8.9  --  8.5* 9.0 8.9 9.1  AST 13*  --  13* 14* 15  --   ALT 12  --  11 13 13   --   ALKPHOS 50  --  42 42 49  --   BILITOT 0.3  --  0.4 0.5 0.3  --   ALBUMIN 2.8*  --  2.7* 2.8* 2.7*  --   MG 1.9  --  2.0 2.0 2.0 2.1  TSH  --  1.380  --   --   --   --   BNP  --   --  94.5 98.4 77.2  --     ------------------------------------------------------------------------------------------------------------------ No results for input(s): CHOL, HDL, LDLCALC, TRIG, CHOLHDL, LDLDIRECT in the last 72 hours.  No results found for: HGBA1C ------------------------------------------------------------------------------------------------------------------ No results for input(s): TSH, T4TOTAL, T3FREE, THYROIDAB in the last 72 hours.  Invalid input(s): FREET3   Cardiac Enzymes No results for input(s): CKMB, TROPONINI, MYOGLOBIN in the last 168 hours.  Invalid input(s): CK ------------------------------------------------------------------------------------------------------------------    Component Value Date/Time   BNP 77.2 07/25/2020 0102     Radiology Reports DG Chest 2  View  Result Date: 07/14/2020 CLINICAL DATA:  Failure to thrive, weight loss EXAM: CHEST - 2 VIEW COMPARISON:  Radiograph 05/31/2020 FINDINGS: Suspect a small calcified mediastinal lymph node seen on lateral radiograph. Remaining cardiomediastinal contours are unremarkable. Bandlike opacity in the right lung base, likely scarring given similar to prior. No consolidation, features of edema, pneumothorax, or effusion. Degenerative changes are present in the imaged spine and shoulders. Evidence of calcific tendinosis  in the left shoulder. Left cervical carotid atherosclerosis. IMPRESSION: Suspect a small calcified mediastinal/hilar node. Can reflect sequela of prior granulomatous disease. No acute cardiopulmonary abnormality. Electronically Signed   By: Kreg Shropshire M.D.   On: 07/14/2020 20:56   MR BRAIN WO CONTRAST  Result Date: 07/14/2020 CLINICAL DATA:  Mental status change. EXAM: MRI HEAD WITHOUT CONTRAST TECHNIQUE: Multiplanar, multiecho pulse sequences of the brain and surrounding structures were obtained without intravenous contrast. COMPARISON:  None. FINDINGS: Brain: No acute infarction, acute hemorrhage, hydrocephalus, extra-axial collection or mass lesion. Moderate scattered T2/FLAIR hyperintensities within the white matter, which are nonspecific but most likely related to chronic microvascular ischemic disease given patient age. Mild-to-moderate atrophy with ex vacuo ventricular dilation. The callosal angle is not acute and there is no crowding of sulci at the vertex. Dilated perivascular space in the inferior left basal ganglia. Small focus of susceptibility artifact in the lateral left cerebellum, most likely a prior microhemorrhage. Vascular: Major arterial flow voids are maintained skull base. Skull and upper cervical spine: Normal marrow signal. Sinuses/Orbits: Visualized sinuses are clear. No acute orbital findings. Other: Small left mastoid effusion. IMPRESSION: 1. No evidence of acute  intracranial abnormality 2. Moderate chronic microvascular ischemic disease and mild-to-moderate atrophy. 3. Small left mastoid effusion. Electronically Signed   By: Feliberto Harts MD   On: 07/14/2020 20:11   MR CERVICAL SPINE W WO CONTRAST  Result Date: 07/22/2020 CLINICAL DATA:  Hyper-reflexia and abnormal gait EXAM: MRI CERVICAL AND THORACIC SPINE WITHOUT AND WITH CONTRAST TECHNIQUE: Multiplanar and multiecho pulse sequences of the cervical spine, to include the craniocervical junction and cervicothoracic junction, and the thoracic spine, were obtained without and with intravenous contrast. CONTRAST:  69mL GADAVIST GADOBUTROL 1 MMOL/ML IV SOLN COMPARISON:  None. FINDINGS: MRI CERVICAL SPINE FINDINGS Alignment: Physiologic. Vertebrae: No fracture, evidence of discitis, or bone lesion. Cord: Normal signal and morphology. Posterior Fossa, vertebral arteries, paraspinal tissues: Negative. Disc levels: Axial images are severely motion degraded. C1-2: Unremarkable. C2-3: Normal disc space and facet joints. There is no spinal canal stenosis. No neural foraminal stenosis. C3-4: Normal disc space and facet joints. There is no spinal canal stenosis. No neural foraminal stenosis. C4-5: Normal disc space and facet joints. There is no spinal canal stenosis. No neural foraminal stenosis. C5-6: Small left asymmetric disc bulge. There is no spinal canal stenosis. Mild left neural foraminal stenosis. C6-7: Left asymmetric disc bulge with uncovertebral spurring. There is no spinal canal stenosis. Moderate left neural foraminal stenosis. C7-T1: Normal disc space and facet joints. There is no spinal canal stenosis. No neural foraminal stenosis. MRI THORACIC SPINE FINDINGS Alignment:  Physiologic. Vertebrae: No fracture, evidence of discitis, or bone lesion. Cord:  Normal signal and morphology. Paraspinal and other soft tissues: Negative. Disc levels: Severely motion degraded axial images. No spinal canal stenosis. There is a  small central disc protrusion noted at T7-8. No abnormal contrast enhancement. IMPRESSION: 1. Motion degraded study. 2. No acute abnormality of the cervical or thoracic spine. 3. Moderate left C6-7 and mild left C5-6 neural foraminal stenosis. 4. No spinal canal stenosis. Electronically Signed   By: Deatra Robinson M.D.   On: 07/22/2020 21:07   MR THORACIC SPINE W WO CONTRAST  Result Date: 07/22/2020 CLINICAL DATA:  Hyper-reflexia and abnormal gait EXAM: MRI CERVICAL AND THORACIC SPINE WITHOUT AND WITH CONTRAST TECHNIQUE: Multiplanar and multiecho pulse sequences of the cervical spine, to include the craniocervical junction and cervicothoracic junction, and the thoracic spine, were obtained without and with intravenous contrast.  CONTRAST:  5mL GADAVIST GADOBUTROL 1 MMOL/ML IV SOLN COMPARISON:  None. FINDINGS: MRI CERVICAL SPINE FINDINGS Alignment: Physiologic. Vertebrae: No fracture, evidence of discitis, or bone lesion. Cord: Normal signal and morphology. Posterior Fossa, vertebral arteries, paraspinal tissues: Negative. Disc levels: Axial images are severely motion degraded. C1-2: Unremarkable. C2-3: Normal disc space and facet joints. There is no spinal canal stenosis. No neural foraminal stenosis. C3-4: Normal disc space and facet joints. There is no spinal canal stenosis. No neural foraminal stenosis. C4-5: Normal disc space and facet joints. There is no spinal canal stenosis. No neural foraminal stenosis. C5-6: Small left asymmetric disc bulge. There is no spinal canal stenosis. Mild left neural foraminal stenosis. C6-7: Left asymmetric disc bulge with uncovertebral spurring. There is no spinal canal stenosis. Moderate left neural foraminal stenosis. C7-T1: Normal disc space and facet joints. There is no spinal canal stenosis. No neural foraminal stenosis. MRI THORACIC SPINE FINDINGS Alignment:  Physiologic. Vertebrae: No fracture, evidence of discitis, or bone lesion. Cord:  Normal signal and morphology.  Paraspinal and other soft tissues: Negative. Disc levels: Severely motion degraded axial images. No spinal canal stenosis. There is a small central disc protrusion noted at T7-8. No abnormal contrast enhancement. IMPRESSION: 1. Motion degraded study. 2. No acute abnormality of the cervical or thoracic spine. 3. Moderate left C6-7 and mild left C5-6 neural foraminal stenosis. 4. No spinal canal stenosis. Electronically Signed   By: Deatra RobinsonKevin  Herman M.D.   On: 07/22/2020 21:07   DG Hip Unilat W or Wo Pelvis 2-3 Views Left  Result Date: 07/14/2020 CLINICAL DATA:  Failure to thrive and weight loss EXAM: DG HIP (WITH OR WITHOUT PELVIS) 2-3V LEFT COMPARISON:  01/01/2020 FINDINGS: Pelvic ring is intact. Old healed pubic rami fractures are noted on the left stable from prior CT examination. Degenerative changes of the hip joints are noted right greater than left. No acute fracture or dislocation in the left hip is noted. Some widening of the left sacroiliac joint is noted which appears more prominent than that seen on prior CT. IMPRESSION: Slight widening of the left sacroiliac joint which may be projectional in nature. Degenerative changes of the hip joints bilaterally right greater than left. No acute fracture is seen. Old fractures in the left pubic rami are seen. Electronically Signed   By: Alcide CleverMark  Lukens M.D.   On: 07/14/2020 20:56

## 2020-07-29 DIAGNOSIS — F332 Major depressive disorder, recurrent severe without psychotic features: Secondary | ICD-10-CM | POA: Diagnosis not present

## 2020-07-29 NOTE — Progress Notes (Signed)
Occupational Therapy Treatment Patient Details Name: Sheila Andrade MRN: [id number] DOB: 02/13/1935 Today's Date: 07/29/2020    History of present illness 85 y.o. female adm with recurrent UTI PMH:  recent COVID, glaucoma osteoporosis, HLD, depression . Pysch consulted   OT comments  Pt progressing towards established OT goals. Pt motivated to participate in therapy session. Pt performing oral care at sink with Mod A for standing balance. Pt performing toilet transfer with mod a+2 and pericare with total A. Pt performing stand pivot transfers with Mod A+2. Pt with fear of fall in standing, however, motivated to participate and benefiting from encouragement. Pt continues to present with decreased strength, balance, safety, awareness, and problem solving. Due to pt motivation, support, and significant change in functional status, continue to highly recommend CIR and will continue to follow acutely as admitted.    Follow Up Recommendations  CIR    Equipment Recommendations  Other (comment) (defer to next venue)    Recommendations for Other Services Rehab consult    Precautions / Restrictions Precautions Precautions: Fall Restrictions Weight Bearing Restrictions: No       Mobility Bed Mobility Overal bed mobility: Needs Assistance Bed Mobility: Supine to Sit     Supine to sit: Min assist;HOB elevated     General bed mobility comments: Max cues to scoot to EOB. Min A hand held assist for raising trunk    Transfers Overall transfer level: Needs assistance Equipment used: Rolling walker (2 wheeled) Transfers: Sit to/from [employer] Sit to Stand: Mod assist;+2 safety/equipment Stand pivot transfers: Mod assist;+2 safety/equipment       General transfer comment: Pt requiring verbal cues for safety to scoot toward EOB, foot placement, and pushing up with arms from surface.    Balance Overall balance assessment: Needs assistance Sitting-balance support: Feet  supported;No upper extremity supported   Sitting balance - Comments: Leaning posteriorly, min guard. Postural control: Posterior lean Standing balance support: Bilateral upper extremity supported;During functional activity Standing balance-Leahy Scale: Poor Standing balance comment: Static standing progressing from Mod A>Min A as posterior lean improved with standing in front of mirror performing oral care.                           ADL either performed or assessed with clinical judgement   ADL Overall ADL's : Needs assistance/impaired     Grooming: Moderate assistance;Standing;Wash/dry face;Brushing hair Grooming Details (indicate cue type and reason): Mod A external assist for standing balance. Pt requiring verbal cues for upright posture initially.             Lower Body Dressing: Maximal assistance Lower Body Dressing Details (indicate cue type and reason): Max A for guiding toes into shoe and tying shoes. Toilet Transfer: Moderate assistance;+2 for physical assistance;+2 for safety/equipment;BSC;Stand-pivot;RW Toilet Transfer Details (indicate cue type and reason): Cues for upright posture and to turn/back up until [facility] was touching bakc of legs Toileting- Clothing Manipulation and Hygiene: Total assistance;Sit to/from stand Toileting - Clothing Manipulation Details (indicate cue type and reason): Total A for pericare and clothing management     Functional mobility during ADLs: Moderate assistance;+2 for physical assistance;+2 for safety/equipment (Heavy mod A +2 for stand pivot transfers; mod A for sit<>stand) General ADL Comments: Performing grooming with Mod A for standing balance. Pt initially requiring verbal cues for upright posture, but able to correct during oral care she started to experience a posterior lean. Pt requiring mod A+2 for stand pivot transfers and  mod A+2 for sit<>stand transfers. Total A for pericare.     Vision       Perception     Praxis       Cognition Arousal/Alertness: Awake/alert Behavior During Therapy: Anxious Overall Cognitive Status: Impaired/Different from baseline Area of Impairment: Following commands;Memory;Attention;Safety/judgement;Awareness;Problem solving                   Current Attention Level: Sustained Memory: Decreased short-term memory Following Commands: Follows one step commands with increased time;Follows one step commands consistently Safety/Judgement: Decreased awareness of safety;Decreased awareness of deficits Awareness: Emergent Problem Solving: Slow processing;Requires verbal cues;Requires tactile cues;Difficulty sequencing General Comments: Pt appearing to have greater confidence this session. Pt following commands with increased time. Mod cues for safety with transfers.        Exercises Other Exercises Other Exercises: 2x stand pivot transfer; 1x sit<>stand transfer   Shoulder Instructions       General Comments Pt pleasant throughout. Benefitting from encouragement.    Pertinent Vitals/ Pain       Pain Assessment: Faces Faces Pain Scale: No hurt  Home Living                                          Prior Functioning/Environment              Frequency  Min 2X/week        Progress Toward Goals  OT Goals(current goals can now be found in the care plan section)  Progress towards OT goals: Progressing toward goals  Acute Rehab OT Goals Patient Stated Goal: to walk with a RW again OT Goal Formulation: With patient/family Time For Goal Achievement: 07/30/20 Potential to Achieve Goals: Good ADL Goals Pt Will Transfer to Toilet: with min guard assist;ambulating;bedside commode Pt Will Perform Toileting - Clothing Manipulation and hygiene: with min assist;sit to/from stand;sitting/lateral leans Additional ADL Goal #1: Patient will tolerate 5 minutes standing activity at min G level in order to participate in self care tasks. Additional ADL  Goal #2: patient will follow 1 step directions with less than 25% multimodal cues in order to participate in self care tasks.  Plan Discharge plan remains appropriate    Co-evaluation                 AM-PAC OT "6 Clicks" Daily Activity     Outcome Measure   Help from another person eating meals?: A Little Help from another person taking care of personal grooming?: A Lot Help from another person toileting, which includes using toliet, bedpan, or urinal?: Total Help from another person bathing (including washing, rinsing, drying)?: A Lot Help from another person to put on and taking off regular upper body clothing?: A Little Help from another person to put on and taking off regular lower body clothing?: A Lot 6 Click Score: 13    End of Session Equipment Utilized During Treatment: Gait belt;Rolling walker;Other (comment)  OT Visit Diagnosis: Other abnormalities of gait and mobility (R26.89);Muscle weakness (generalized) (M62.81);Other symptoms and signs involving cognitive function;Unsteadiness on feet (R26.81)   Activity Tolerance Patient tolerated treatment well   Patient Left with call bell/phone within reach;in chair;with chair alarm set;Other (comment) (PT entering room)   Nurse Communication Mobility status        Time: 4098-1191 OT Time Calculation (min): 24 min  Charges: OT General Charges $OT Visit: 1 Visit OT Treatments $Self  Care/Home Management : 23-37 mins  Ladene Artist, OTDS    Ladene Artist 07/29/2020, 11:52 AM

## 2020-07-29 NOTE — TOC Progression Note (Addendum)
Transition of Care [facility]) - Progression Note    Patient Details  Name: Jolanda Mccann MRN: [id number] Date of Birth: [birth year]  [facility] CM/SW Contact  Kermit Balo, RN Phone Number: 07/29/2020, 2:34 PM  Clinical Narrative:    Pt still with no bed offers. Per daughter, Marisue Ivan they are interested in any 5 star facility in the [place] or local.  CM has called [facility] again and left another voicemail, [facility] is only taking patients from [facility]. CM has left voicemail for [facility]: [phone] [facility] is not in network with [facility] [employer] [facility] only admits from their community. CM has asked [facility] to please re look at the referral.  CM has also inquired with [facility] about their beds.  CM has faxed referral to [facility].  TOC following.   Expected Discharge Plan: Skilled Nursing Facility Barriers to Discharge: SNF Pending bed offer  Expected Discharge Plan and Services Expected Discharge Plan: Skilled Nursing Facility   Discharge Planning Services: CM Consult Post Acute Care Choice: Skilled Nursing Facility Living arrangements for the past 2 months: Single Family Home                                       Social Determinants of Health (SDOH) Interventions    Readmission Risk Interventions No flowsheet data found.

## 2020-07-29 NOTE — Progress Notes (Signed)
Physical Therapy Treatment Patient Details Name: Sheila Andrade MRN: [id number] DOB: 1935/02/13 Today's Date: 07/29/2020    History of Present Illness 85 y.o. female adm with recurrent UTI PMH:  recent COVID, glaucoma osteoporosis, HLD, depression . Pysch consulted    PT Comments    Continuing work on functional mobility and activity tolerance;  Session focused on the anterior weight shift movement pattern repetition to initiate sit to stand; and keeping weight anteriorly over feet for balance in standing, and proceeding into taking steps and walking; Pt was quite nervous and fearful of falling, but still participating well with reassurance and close guard; Able to organize her center of mass over her feet in standing initially with multimodal cues, but then with practice, able to regain balance and anterior shift with the cue to "reset" (with UE support on RW the whole time); light cueing at sacrum during amb with second person for chair push; good progress with balance and amb distance;   Follow Up Recommendations  CIR; noted insurance denial; showing good rehab potential; recommend focus on physical rehabilitation at [facility]     Equipment Recommendations  None recommended by PT    Recommendations for Other Services Rehab consult     Precautions / Restrictions Precautions Precautions: Fall Precaution Comments: tends to lean posteriorly Restrictions Weight Bearing Restrictions: No    Mobility  Bed Mobility Overal bed mobility: Needs Assistance Bed Mobility: Supine to Sit     Supine to sit: Min assist;HOB elevated     General bed mobility comments: Max cues to scoot to EOB. Min A hand held assist for raising trunk    Transfers Overall transfer level: Needs assistance Equipment used: Rolling walker (2 wheeled) Transfers: Sit to/from Stand Sit to Stand: Mod assist;+2 safety/equipment Stand pivot transfers: Mod assist;+2 safety/equipment       General transfer comment:  Started by focusing on initiating sit to stand with forward lean; worked on inital moment of "liftoff", rising minimally (approx 1 inch), holding as able, and sitting back down, with multimodal cueing to maintain forward lean; Light mod assist for full sit to stand to RW with heavy cueing for keeping weight anterior  Ambulation/Gait Ambulation/Gait assistance: +2 safety/equipment;Mod assist;Min assist Gait Distance (Feet): 40 Feet Assistive device: Rolling walker (2 wheeled) Gait Pattern/deviations: Decreased step length - right;Decreased step length - left;Shuffle;Leaning posteriorly;Trunk flexed;Decreased stride length;Step-through pattern Gait velocity: decr   General Gait Details: multimodal cueing and facilitation for fully upright standing, keeping gaze at eye-level or higher, keeping steps slow; able to stop and regain balance over feet with cue to "reset", and noted occasionally pt able to identify the need to reset without cues; chair behind for safety and to boost pt's confidence   Stairs             Wheelchair Mobility    Modified Rankin (Stroke Patients Only)       Balance Overall balance assessment: Needs assistance Sitting-balance support: Feet supported;No upper extremity supported   Sitting balance - Comments: Leaning posteriorly, min guard. Postural control: Posterior lean Standing balance support: Bilateral upper extremity supported;During functional activity Standing balance-Leahy Scale: Poor Standing balance comment: took lots of time to devote to getting full hip extension in standing, and find her balance; used the cue to "reset" when noticing her balance drifting posteriorly with some success                            Cognition Arousal/Alertness: Awake/alert Behavior  During Therapy: Anxious Overall Cognitive Status: Impaired/Different from baseline Area of Impairment: Following commands;Memory;Attention;Safety/judgement;Awareness;Problem  solving                   Current Attention Level:  (Internally distracted by fear of falling) Memory: Decreased short-term memory Following Commands: Follows one step commands with increased time;Follows one step commands consistently Safety/Judgement: Decreased awareness of safety;Decreased awareness of deficits Awareness: Emergent Problem Solving: Slow processing;Requires verbal cues;Requires tactile cues;Difficulty sequencing General Comments: Pt appearing to have greater confidence this session. Pt following commands with increased time. Mod cues for safety with transfers.      Exercises Other Exercises Other Exercises: 2x stand pivot transfer; 1x sit<>stand transfer Other Exercises: seated hinge at hips and forward lean with bil UE and LE push to lift hps off of seat approx 1inch, hold 1-3 seconds, and then sit back down; 5 reps Other Exercises: standing hip extension, gluteal squeeze x5    General Comments General comments (skin integrity, edema, etc.): Pt pleasant throughout. Benefitting from encouragement.      Pertinent Vitals/Pain Pain Assessment: Faces Faces Pain Scale: No hurt Pain Intervention(s): Monitored during session    Home Living                      Prior Function            PT Goals (current goals can now be found in the care plan section) Acute Rehab PT Goals Patient Stated Goal: to walk with a RW again PT Goal Formulation: With patient Time For Goal Achievement: 08/12/20 Potential to Achieve Goals: Good Progress towards PT goals: Goals met and updated - see care plan    Frequency    Min 3X/week      PT Plan Current plan remains appropriate    Co-evaluation              AM-PAC PT "6 Clicks" Mobility   Outcome Measure  Help needed turning from your back to your side while in a flat bed without using bedrails?: A Little Help needed moving from lying on your back to sitting on the side of a flat bed without using  bedrails?: A Little Help needed moving to and from a bed to a chair (including a wheelchair)?: A Lot Help needed standing up from a chair using your arms (e.g., wheelchair or bedside chair)?: A Lot Help needed to walk in hospital room?: A Lot Help needed climbing 3-5 steps with a railing? : A Lot 6 Click Score: 14    End of Session Equipment Utilized During Treatment: Gait belt Activity Tolerance: Patient tolerated treatment well Patient left: in chair;with call bell/phone within reach;with chair alarm set;with nursing/sitter in room Nurse Communication: Mobility status PT Visit Diagnosis: Other abnormalities of gait and mobility (R26.89);Difficulty in walking, not elsewhere classified (R26.2);Unsteadiness on feet (R26.81);Muscle weakness (generalized) (M62.81)     Time: 1122-1202 PT Time Calculation (min) (ACUTE ONLY): 40 min  Charges:  $Gait Training: 23-37 mins $Neuromuscular Re-education: 8-22 mins                     Roney Marion, PT  Acute Rehabilitation Services Pager [phone] Office [place] 07/29/2020, 2:27 PM

## 2020-07-29 NOTE — Consult Note (Signed)
[facility] Face-to-Face Psychiatry Consult   Reason for Consult:  "severe depression/intermittent suicidal ideation" Referring Physician:  Glade Lloyd Patient Identification: Sheila Andrade MRN:  [id number] Principal Diagnosis: MDD (major depressive disorder), recurrent episode, severe (HCC) Diagnosis:  Principal Problem:   MDD (major depressive disorder), recurrent episode, severe (HCC) Active Problems:   Weight loss   Anemia   UTI (urinary tract infection)   Hyponatremia   Suicidal ideations   Dehydration   Protein-calorie malnutrition, severe   Total Time spent with patient: 20 minutes  Subjective:   Sheila Andrade is a 85 y.o. female patient admitted due to confusion and inability to stand as well as weakness and loss of balance.  HPI:  Pt is a 85 yo female w/ depression secondary to medical problems. Pt seen at bedside this morning. Pt doing well. Pt alert and oriented. Pt states she feels some days she has "rays of hope" and other times she will feel "down". Pt stating she still has some cold sweats when doing PT but less so. Pt sleeping well.  Pt denies SI/HI/AVH.   Past Psychiatric History: MDD  Risk to Self:   No Risk to Others:   No Prior Inpatient Therapy:   None Prior Outpatient Therapy:   yes  Past Medical History:  Past Medical History:  Diagnosis Date   Hot flashes 06/06/2020   Hypertension    History reviewed. No pertinent surgical history. Family History:  Family History  Problem Relation Age of Onset   Hypertension Other    Family Psychiatric  History: n/a Social History:  Social History   Substance and Sexual Activity  Alcohol Use None     Social History   Substance and Sexual Activity  Drug Use Never    Social History   Socioeconomic History   Marital status: Widowed    Spouse name: Not on file   Number of children: Not on file   Years of education: Not on file   Highest education level: Not on file  Occupational History   Not on file   Tobacco Use   Smoking status: Never   Smokeless tobacco: Never  Substance and Sexual Activity   Alcohol use: Not on file   Drug use: Never   Sexual activity: Not on file  Other Topics Concern   Not on file  Social History Narrative   Not on file   Social Determinants of Health   Financial Resource Strain: Not on file  Food Insecurity: Not on file  Transportation Needs: Not on file  Physical Activity: Not on file  Stress: Not on file  Social Connections: Not on file   Additional Social History:    Allergies:  No Known Allergies  Labs:  Results for orders placed or performed during the hospital encounter of 07/14/20 (from the past 48 hour(s))  CBC     Status: Abnormal   Collection Time: 07/28/20  1:53 AM  Result Value Ref Range   WBC 7.1 4.0 - 10.5 K/uL   RBC 3.67 (L) 3.87 - 5.11 MIL/uL   Hemoglobin 10.6 (L) 12.0 - 15.0 g/dL   HCT 18.8 (L) 41.6 - 60.6 %   MCV 88.8 80.0 - 100.0 fL   MCH 28.9 26.0 - 34.0 pg   MCHC 32.5 30.0 - 36.0 g/dL   RDW 30.1 60.1 - 09.3 %   Platelets 341 150 - 400 K/uL   nRBC 0.0 0.0 - 0.2 %    Comment: Performed at [facility], [address] [place], [place] [address]  Basic metabolic panel     Status: Abnormal   Collection Time: 07/28/20  1:53 AM  Result Value Ref Range   Sodium 133 (L) 135 - 145 mmol/L   Potassium 4.5 3.5 - 5.1 mmol/L   Chloride 104 98 - 111 mmol/L   CO2 23 22 - 32 mmol/L   Glucose, Bld 105 (H) 70 - 99 mg/dL    Comment: Glucose reference range applies only to samples taken after fasting for at least 8 hours.   BUN 24 (H) 8 - 23 mg/dL   Creatinine, Ser 2.20 0.44 - 1.00 mg/dL   Calcium 9.1 8.9 - 25.4 mg/dL   GFR, Estimated >27 >06 mL/min    Comment: (NOTE) Calculated using the CKD-EPI Creatinine Equation (2021)    Anion gap 6 5 - 15    Comment: Performed at [facility], [address] [place], [place] [address]  Magnesium     Status: None   Collection Time: 07/28/20  1:53 AM  Result Value Ref Range    Magnesium 2.1 1.7 - 2.4 mg/dL    Comment: Performed at [facility], [address], [place], [place] [address]    Current Facility-Administered Medications  Medication Dose Route Frequency Provider Last Rate Last Admin   acetaminophen (TYLENOL) tablet 650 mg  650 mg Oral Q6H PRN Therisa Doyne, MD   650 mg at 07/18/20 1112   Or   acetaminophen (TYLENOL) suppository 650 mg  650 mg Rectal Q6H PRN Doutova, Anastassia, MD       brinzolamide (AZOPT) 1 % ophthalmic suspension 1 drop  1 drop Left Eye BID Doutova, Anastassia, MD   1 drop at 07/28/20 2133   enoxaparin (LOVENOX) injection 40 mg  40 mg Subcutaneous Q24H Alekh, Kshitiz, MD   40 mg at 07/28/20 1212   feeding supplement (ENSURE ENLIVE / ENSURE PLUS) liquid 237 mL  237 mL Oral BID BM Doutova, Anastassia, MD   237 mL at 07/28/20 1419   FLUoxetine (PROZAC) capsule 20 mg  20 mg Oral Daily Park Pope, MD   20 mg at 07/28/20 0848   hydrOXYzine (ATARAX/VISTARIL) tablet 25 mg  25 mg Oral TID PRN Eliseo Gum B, MD       latanoprost (XALATAN) 0.005 % ophthalmic solution 1 drop  1 drop Left Eye QHS Doutova, Jonny Ruiz, MD   1 drop at 07/28/20 2133   megestrol (MEGACE) 400 MG/10ML suspension 400 mg  400 mg Oral Daily Glade Lloyd, MD   400 mg at 07/28/20 0849   multivitamin with minerals tablet 1 tablet  1 tablet Oral Daily Glade Lloyd, MD   1 tablet at 07/28/20 0848   polyethylene glycol (MIRALAX / GLYCOLAX) packet 17 g  17 g Oral Daily PRN Calvert Cantor, MD   17 g at 07/28/20 0846   simvastatin (ZOCOR) tablet 20 mg  20 mg Oral Daily Doutova, Anastassia, MD   20 mg at 07/28/20 0849   traZODone (DESYREL) tablet 25 mg  25 mg Oral QHS Doutova, Anastassia, MD   25 mg at 07/28/20 2133   venlafaxine XR (EFFEXOR-XR) 24 hr capsule 37.5 mg  37.5 mg Oral Q breakfast Akintayo, Mojeed, MD   37.5 mg at 07/28/20 0849     Psychiatric Specialty Exam:  Presentation  General Appearance: Appropriate for Environment; Casual  Eye  Contact:Minimal  Speech:Slow; Clear and Coherent  Speech Volume:Decreased   Mood and Affect  Mood:Depressed; Dysphoric  Affect:Depressed; Flat   Thought Process  Thought Processes:Coherent; Linear; Goal Directed  Descriptions of Associations:Intact  Orientation:Full (Time, Place and Person)  Thought Content:Logical  History of Schizophrenia/Schizoaffective disorder:No data recorded Duration of Psychotic Symptoms:No data recorded Hallucinations:No data recorded Ideas of Reference:None  Suicidal Thoughts:No data recorded Homicidal Thoughts:No data recorded  Sensorium  Memory:Recent Good; Immediate Good; Remote Good  Judgment:Fair  Insight:Fair   Executive Functions  Concentration:Fair  Attention Span:Good  Recall:Good  Fund of Knowledge:Good  Language:Good   Psychomotor Activity  Psychomotor Activity: No data recorded  Assets  Assets:Desire for Improvement; Manufacturing systems engineer; Housing; Social Support; Resilience; Physical Health; Leisure Time; Vocational/Educational; Talents/Skills; Transportation; Financial Resources/Insurance   Sleep  Sleep: No data recorded  Physical Exam: Physical Exam Vitals reviewed.  Constitutional:      Appearance: Normal appearance.  HENT:     Head: Normocephalic and atraumatic.  Eyes:     Extraocular Movements: Extraocular movements intact.  Cardiovascular:     Rate and Rhythm: Normal rate and regular rhythm.  Pulmonary:     Effort: Pulmonary effort is normal.     Breath sounds: Normal breath sounds.  Abdominal:     General: Abdomen is flat.     Palpations: Abdomen is soft.  Musculoskeletal:     Cervical back: Normal range of motion.  Skin:    General: Skin is warm and dry.  Neurological:     General: No focal deficit present.     Mental Status: She is alert and oriented to person, place, and time.   Review of Systems  Constitutional:  Negative for chills and fever.  Respiratory:  Negative for cough,  shortness of breath and wheezing.   Cardiovascular:  Negative for chest pain and palpitations.  Gastrointestinal:  Negative for abdominal pain, nausea and vomiting.  Skin:  Negative for itching and rash.  Neurological:  Negative for dizziness and headaches.  Blood pressure 117/63, pulse 78, temperature 98 F (36.7 C), temperature source Oral, resp. rate 18, height 5\' 7"  (1.702 m), weight 51.3 kg, SpO2 99 %. Body mass index is 17.7 kg/m.  Treatment Plan Summary: Pt is a 85 yo female w/ depression secondary to medical problems. Pt doing well. Prozac dose increasing to 20 mg today and effexor taper to 37.5 x 1 week.   Depression related to medical problems vs MDD -Continue Effexor XR 37.5 mg qd until 08/04/20 then stop. -Continue Prozac 20 mg qd for depression -Continue Megace 400 mg daily for appetite stimulation -Continue Trazodone 25 mg at bedtime for sleep -Recommend outpatient psychiatry referral for med management upon discharge -Recommend Atarax/Vistaril 25 mg tid prn for anxiety -Recommend avoiding Ativan for patient as patient becomes too sedated with Ativan -will continue to follow  Disposition: No evidence of imminent risk to self or others at present.   Patient does not meet criteria for psychiatric inpatient admission. Supportive therapy provided about ongoing stressors.  Thank you for the consult 08/06/20, MD PGY1 Psychiatry Resident 07/29/2020 9:42 AM

## 2020-07-29 NOTE — Progress Notes (Signed)
PROGRESS NOTE                                                                                                                                                                                                             Patient Demographics:    Sheila Andrade, is a 85 y.o. female, DOB - 1935-03-22, XLK:[id number]  Outpatient Primary MD for the patient is Dalphine Handing, MD    LOS - 13  Admit date - 07/14/2020    Chief Complaint  Patient presents with   Failure To Thrive   Fatigue       Brief Narrative (HPI from H&P)  - 85 year old female with history of depression, ongoing functional decline, fall with history of pelvic fracture, recent development of tremors and bradykinesia who was brought in for suicidal ideation, decreased oral intake and ongoing generalized deconditioning, was seen by psych and neurology also as she had ongoing functional decline with bradykinesia.  She was admitted to [facility] hospital thereafter transferred to [facility] on 07/22/2020 for continued neurology follow-up.   Subjective:   Patient in bed, appears comfortable, denies any headache, no fever, no chest pain or pressure, no shortness of breath , no abdominal pain. No new focal weakness.   Assessment  & Plan :     Severe depression and suicidal ideation.  Seen by psych psych is following.  Currently not actively suicidal or homicidal, still mildly anxious, on Effexor and trazodone and will defer all psych issues to psychiatry directly, have requested them to follow on a daily basis.  2.  Failure to thrive and ongoing functional decline with decreased oral intake.  Supportive care, psych treatment as above.  Continue oral protein supplementation, oral intake has improved, she will require SNF/CIR depending on insurance approval.  3.  Poor balance, bradykinesia, hyperreflexia in both lower extremities.  MRI brain, C and L-spine unremarkable,  neurology is following, case discussed with neurologist on 07/23/2020 outpatient neurology work-up with continued PT OT , will need placement.  She is now medically stable for discharge await bed.  4.  Vitamin B12 deficiency.  Placed on replacement.  5.  Mild hyponatremia.  Resolved after IV fluids.  6.  Moderate to severe protein calorie malnutrition.  On supplements.  7.  Abnormal UA.  UTI ruled out clinically.  Condition - Fair  Family Communication  :   Message left for patient's daughter DC through patient's phone on 07/22/2020, daughter updated bedside on 07/23/2020  Corrie Dandy [phone] - 07/24/20  [name] [phone] on 07/27/20 message left at 11:26 AM  Marisue Ivan [phone] on 07/28/20 at 11.19 am message left, 07/29/20    Code Status : Full  Consults  : Psychiatry, neurology  PUD Prophylaxis : None   Procedures  :     MRI Brain C&T Spine.  Nonacute.      Disposition Plan  :    Status is: Inpatient  Remains inpatient appropriate because:IV treatments appropriate due to intensity of illness or inability to take PO  Dispo: The patient is from: Home              Anticipated d/c is to: SNF              Patient currently is not medically stable to d/c.   Difficult to place patient No  DVT Prophylaxis  :    enoxaparin (LOVENOX) injection 40 mg Start: 07/21/20 1200 SCDs Start: 07/15/20 0036   Lab Results  Component Value Date   PLT 341 07/28/2020    Diet :  Diet Order             Diet regular Room service appropriate? Yes; Fluid consistency: Thin  Diet effective now                    Inpatient Medications  Scheduled Meds:  brinzolamide  1 drop Left Eye BID   enoxaparin (LOVENOX) injection  40 mg Subcutaneous Q24H   feeding supplement  237 mL Oral BID BM   FLUoxetine  20 mg Oral Daily   latanoprost  1 drop Left Eye QHS   megestrol  400 mg Oral Daily   multivitamin with minerals  1 tablet Oral Daily   simvastatin  20 mg Oral Daily    traZODone  25 mg Oral QHS   venlafaxine XR  37.5 mg Oral Q breakfast   Continuous Infusions:   PRN Meds:.acetaminophen **OR** acetaminophen, hydrOXYzine, polyethylene glycol  Antibiotics  :    Anti-infectives (From admission, onward)    Start     Dose/Rate Route Frequency Ordered Stop   07/15/20 2000  cefTRIAXone (ROCEPHIN) 1 g in sodium chloride 0.9 % 100 mL IVPB  Status:  Discontinued        1 g 200 mL/hr over 30 Minutes Intravenous Every 24 hours 07/15/20 0000 07/15/20 1635   07/14/20 2315  cefTRIAXone (ROCEPHIN) 1 g in sodium chloride 0.9 % 100 mL IVPB        1 g 200 mL/hr over 30 Minutes Intravenous  Once 07/14/20 2307 07/15/20 0040        Time Spent in minutes  30   Susa Raring M.D on 07/29/2020 at 10:39 AM  To page go to [email or web address]   [facility] -  Office  [phone]  See all Orders from today for further details    Objective:   Vitals:   07/28/20 1950 07/28/20 2334 07/29/20 0328 07/29/20 0822  BP: 105/72 113/67 118/65 117/63  Pulse: 87 76 80 78  Resp: 18 18 16 18   Temp: 98 F (36.7 C) 98.1 F (36.7 C) 98 F (36.7 C) 98 F (36.7 C)  TempSrc: Oral Axillary Oral Oral  SpO2:  94%  99%  Weight:      Height:        Wt Readings from Last 3  Encounters:  07/14/20 51.3 kg  05/31/20 54.4 kg  05/27/20 52.8 kg     Intake/Output Summary (Last 24 hours) at 07/29/2020 1039 Last data filed at 07/28/2020 1420 Gross per 24 hour  Intake --  Output 575 ml  Net -575 ml    Physical Exam  Mildly somnolent but waking up, alert x 2, bilateral lower extremity strength 4/5 with increased tone and hyperreflexia, normal affect [place].AT,PERRAL Supple Neck,No JVD, No cervical lymphadenopathy appriciated.  Symmetrical Chest wall movement, Good air movement bilaterally, CTAB RRR,No Gallops, Rubs or new Murmurs, No Parasternal Heave +ve B.Sounds, Abd Soft, No tenderness, No organomegaly appriciated, No rebound - guarding or rigidity. No Cyanosis, Clubbing or  edema, No new Rash or bruise    Data Review:    CBC Recent Labs  Lab 07/23/20 0337 07/24/20 0403 07/25/20 0102 07/28/20 0153  WBC 6.3 6.6 6.9 7.1  HGB 10.1* 10.8* 10.6* 10.6*  HCT 30.9* 33.3* 32.6* 32.6*  PLT 291 296 321 341  MCV 88.3 88.3 87.9 88.8  MCH 28.9 28.6 28.6 28.9  MCHC 32.7 32.4 32.5 32.5  RDW 14.7 14.6 14.6 14.7  LYMPHSABS 2.0 2.0 1.9  --   MONOABS 0.6 0.6 0.6  --   EOSABS 0.1 0.1 0.1  --   BASOSABS 0.0 0.0 0.0  --     Recent Labs  Lab 07/22/20 1127 07/23/20 0337 07/24/20 0403 07/25/20 0102 07/28/20 0153  NA  --  137 134* 133* 133*  K  --  3.8 3.9 4.3 4.5  CL  --  107 107 105 104  CO2  --  22 21* 21* 23  GLUCOSE  --  112* 101* 99 105*  BUN  --  16 12 17  24*  CREATININE  --  0.60 0.64 0.70 0.78  CALCIUM  --  8.5* 9.0 8.9 9.1  AST  --  13* 14* 15  --   ALT  --  11 13 13   --   ALKPHOS  --  42 42 49  --   BILITOT  --  0.4 0.5 0.3  --   ALBUMIN  --  2.7* 2.8* 2.7*  --   MG  --  2.0 2.0 2.0 2.1  TSH 1.380  --   --   --   --   BNP  --  94.5 98.4 77.2  --     ------------------------------------------------------------------------------------------------------------------ No results for input(s): CHOL, HDL, LDLCALC, TRIG, CHOLHDL, LDLDIRECT in the last 72 hours.  No results found for: HGBA1C ------------------------------------------------------------------------------------------------------------------ No results for input(s): TSH, T4TOTAL, T3FREE, THYROIDAB in the last 72 hours.  Invalid input(s): FREET3   Cardiac Enzymes No results for input(s): CKMB, TROPONINI, MYOGLOBIN in the last 168 hours.  Invalid input(s): CK ------------------------------------------------------------------------------------------------------------------    Component Value Date/Time   BNP 77.2 07/25/2020 0102     Radiology Reports  DG Chest 2 View  Result Date: 07/14/2020 CLINICAL DATA:  Failure to thrive, weight loss EXAM: CHEST - 2 VIEW COMPARISON:   Radiograph 05/31/2020 FINDINGS: Suspect a small calcified mediastinal lymph node seen on lateral radiograph. Remaining cardiomediastinal contours are unremarkable. Bandlike opacity in the right lung base, likely scarring given similar to prior. No consolidation, features of edema, pneumothorax, or effusion. Degenerative changes are present in the imaged spine and shoulders. Evidence of calcific tendinosis in the left shoulder. Left cervical carotid atherosclerosis. IMPRESSION: Suspect a small calcified mediastinal/hilar node. Can reflect sequela of prior granulomatous disease. No acute cardiopulmonary abnormality. Electronically Signed   By: 09/14/2020  M.D.   On: 07/14/2020 20:56   MR BRAIN WO CONTRAST  Result Date: 07/14/2020 CLINICAL DATA:  Mental status change. EXAM: MRI HEAD WITHOUT CONTRAST TECHNIQUE: Multiplanar, multiecho pulse sequences of the brain and surrounding structures were obtained without intravenous contrast. COMPARISON:  None. FINDINGS: Brain: No acute infarction, acute hemorrhage, hydrocephalus, extra-axial collection or mass lesion. Moderate scattered T2/FLAIR hyperintensities within the white matter, which are nonspecific but most likely related to chronic microvascular ischemic disease given patient age. Mild-to-moderate atrophy with ex vacuo ventricular dilation. The callosal angle is not acute and there is no crowding of sulci at the vertex. Dilated perivascular space in the inferior left basal ganglia. Small focus of susceptibility artifact in the lateral left cerebellum, most likely a prior microhemorrhage. Vascular: Major arterial flow voids are maintained skull base. Skull and upper cervical spine: Normal marrow signal. Sinuses/Orbits: Visualized sinuses are clear. No acute orbital findings. Other: Small left mastoid effusion. IMPRESSION: 1. No evidence of acute intracranial abnormality 2. Moderate chronic microvascular ischemic disease and mild-to-moderate atrophy. 3. Small left  mastoid effusion. Electronically Signed   By: Feliberto HartsFrederick S Jones MD   On: 07/14/2020 20:11   MR CERVICAL SPINE W WO CONTRAST  Result Date: 07/22/2020 CLINICAL DATA:  Hyper-reflexia and abnormal gait EXAM: MRI CERVICAL AND THORACIC SPINE WITHOUT AND WITH CONTRAST TECHNIQUE: Multiplanar and multiecho pulse sequences of the cervical spine, to include the craniocervical junction and cervicothoracic junction, and the thoracic spine, were obtained without and with intravenous contrast. CONTRAST:  5mL GADAVIST GADOBUTROL 1 MMOL/ML IV SOLN COMPARISON:  None. FINDINGS: MRI CERVICAL SPINE FINDINGS Alignment: Physiologic. Vertebrae: No fracture, evidence of discitis, or bone lesion. Cord: Normal signal and morphology. Posterior Fossa, vertebral arteries, paraspinal tissues: Negative. Disc levels: Axial images are severely motion degraded. C1-2: Unremarkable. C2-3: Normal disc space and facet joints. There is no spinal canal stenosis. No neural foraminal stenosis. C3-4: Normal disc space and facet joints. There is no spinal canal stenosis. No neural foraminal stenosis. C4-5: Normal disc space and facet joints. There is no spinal canal stenosis. No neural foraminal stenosis. C5-6: Small left asymmetric disc bulge. There is no spinal canal stenosis. Mild left neural foraminal stenosis. C6-7: Left asymmetric disc bulge with uncovertebral spurring. There is no spinal canal stenosis. Moderate left neural foraminal stenosis. C7-T1: Normal disc space and facet joints. There is no spinal canal stenosis. No neural foraminal stenosis. MRI THORACIC SPINE FINDINGS Alignment:  Physiologic. Vertebrae: No fracture, evidence of discitis, or bone lesion. Cord:  Normal signal and morphology. Paraspinal and other soft tissues: Negative. Disc levels: Severely motion degraded axial images. No spinal canal stenosis. There is a small central disc protrusion noted at T7-8. No abnormal contrast enhancement. IMPRESSION: 1. Motion degraded study. 2.  No acute abnormality of the cervical or thoracic spine. 3. Moderate left C6-7 and mild left C5-6 neural foraminal stenosis. 4. No spinal canal stenosis. Electronically Signed   By: Deatra RobinsonKevin  Herman M.D.   On: 07/22/2020 21:07   MR THORACIC SPINE W WO CONTRAST  Result Date: 07/22/2020 CLINICAL DATA:  Hyper-reflexia and abnormal gait EXAM: MRI CERVICAL AND THORACIC SPINE WITHOUT AND WITH CONTRAST TECHNIQUE: Multiplanar and multiecho pulse sequences of the cervical spine, to include the craniocervical junction and cervicothoracic junction, and the thoracic spine, were obtained without and with intravenous contrast. CONTRAST:  5mL GADAVIST GADOBUTROL 1 MMOL/ML IV SOLN COMPARISON:  None. FINDINGS: MRI CERVICAL SPINE FINDINGS Alignment: Physiologic. Vertebrae: No fracture, evidence of discitis, or bone lesion. Cord: Normal signal and morphology. Posterior  Fossa, vertebral arteries, paraspinal tissues: Negative. Disc levels: Axial images are severely motion degraded. C1-2: Unremarkable. C2-3: Normal disc space and facet joints. There is no spinal canal stenosis. No neural foraminal stenosis. C3-4: Normal disc space and facet joints. There is no spinal canal stenosis. No neural foraminal stenosis. C4-5: Normal disc space and facet joints. There is no spinal canal stenosis. No neural foraminal stenosis. C5-6: Small left asymmetric disc bulge. There is no spinal canal stenosis. Mild left neural foraminal stenosis. C6-7: Left asymmetric disc bulge with uncovertebral spurring. There is no spinal canal stenosis. Moderate left neural foraminal stenosis. C7-T1: Normal disc space and facet joints. There is no spinal canal stenosis. No neural foraminal stenosis. MRI THORACIC SPINE FINDINGS Alignment:  Physiologic. Vertebrae: No fracture, evidence of discitis, or bone lesion. Cord:  Normal signal and morphology. Paraspinal and other soft tissues: Negative. Disc levels: Severely motion degraded axial images. No spinal canal stenosis.  There is a small central disc protrusion noted at T7-8. No abnormal contrast enhancement. IMPRESSION: 1. Motion degraded study. 2. No acute abnormality of the cervical or thoracic spine. 3. Moderate left C6-7 and mild left C5-6 neural foraminal stenosis. 4. No spinal canal stenosis. Electronically Signed   By: Deatra Robinson M.D.   On: 07/22/2020 21:07   DG Hip Unilat W or Wo Pelvis 2-3 Views Left  Result Date: 07/14/2020 CLINICAL DATA:  Failure to thrive and weight loss EXAM: DG HIP (WITH OR WITHOUT PELVIS) 2-3V LEFT COMPARISON:  01/01/2020 FINDINGS: Pelvic ring is intact. Old healed pubic rami fractures are noted on the left stable from prior CT examination. Degenerative changes of the hip joints are noted right greater than left. No acute fracture or dislocation in the left hip is noted. Some widening of the left sacroiliac joint is noted which appears more prominent than that seen on prior CT. IMPRESSION: Slight widening of the left sacroiliac joint which may be projectional in nature. Degenerative changes of the hip joints bilaterally right greater than left. No acute fracture is seen. Old fractures in the left pubic rami are seen. Electronically Signed   By: Alcide Clever M.D.   On: 07/14/2020 20:56

## 2020-07-30 DIAGNOSIS — E871 Hypo-osmolality and hyponatremia: Secondary | ICD-10-CM | POA: Diagnosis not present

## 2020-07-30 DIAGNOSIS — E860 Dehydration: Secondary | ICD-10-CM | POA: Diagnosis not present

## 2020-07-30 MED ORDER — ENSURE ENLIVE PO LIQD
237.0000 mL | Freq: Three times a day (TID) | ORAL | Status: DC
  Administered 2020-07-31 – 2020-08-02 (×8): 237 mL via ORAL
  Filled 2020-07-30 (×5): qty 237

## 2020-07-30 NOTE — Progress Notes (Addendum)
PROGRESS NOTE                                                                                                                                                                                                             Patient Demographics:    Sheila Andrade, is a 85 y.o. female, DOB - [birth year], VOH:[id number]  Outpatient Primary MD for the patient is Dalphine Handing, MD    LOS - 14  Admit date - 07/14/2020    Chief Complaint  Patient presents with   Failure To Thrive   Fatigue       Brief Narrative (HPI from H&P)  - 85 year old female with history of depression, ongoing functional decline, fall with history of pelvic fracture, recent development of tremors and bradykinesia who was brought in for suicidal ideation, decreased oral intake and ongoing generalized deconditioning, was seen by psych and neurology also as she had ongoing functional decline with bradykinesia.  She was admitted to [facility] long hospital thereafter transferred to [facility] on 07/22/2020 for continued neurology & Psych follow-up.   She is now medically stable for discharge we await a bed for placement.    Subjective:   Seen in bed having breakfast denies any headache chest or abdominal pain, no shortness of breath.  Eagerly awaiting SNF placement.   Assessment  & Plan :     Severe depression and suicidal ideation.  Seen by psych, psych is following.  Currently not actively suicidal or homicidal, still mildly anxious, on Effexor and trazodone and will defer all psych issues to psychiatry directly, have requested them to follow on a daily basis.   Psych Recommendations -  Continue Effexor XR 37.5 mg qd until 08/04/20 then stop. -Continue Prozac 20 mg qd for depression -Continue Megace 400 mg daily for appetite stimulation -Continue Trazodone 25 mg at bedtime for sleep -Recommend Atarax/Vistaril 25 mg tid prn for anxiety -Recommend avoiding  Ativan for patient as patient becomes too sedated with Ativan -Recommend outpatient psychiatry referral for med management upon discharge   2.  Failure to thrive and ongoing functional decline with decreased oral intake.  Supportive care, psych treatment as above.  Continue oral protein supplementation, on Megace, oral intake has improved, she will require SNF/CIR depending on insurance approval.  3.  Poor balance, bradykinesia, hyperreflexia in both lower extremities.  MRI  brain, C and L-spine unremarkable, neurology is following, case discussed with neurologist on 07/23/2020 outpatient neurology work-up with continued PT OT , will need placement.  She is now medically stable for discharge await bed.  4.  Vitamin B12 deficiency.  Placed on replacement.  5.  Mild hyponatremia.  Resolved after IV fluids.  6.  Moderate to severe protein calorie malnutrition.  On supplements.  7.  Abnormal UA.  UTI ruled out clinically.         Condition - Fair  Family Communication  :   Message left for patient's daughter DC through patient's phone on 07/22/2020, daughter updated bedside on 07/23/2020  Corrie DandyMary [phone] - 07/24/20  [name] [phone] on 07/27/20 message left at 11:26 AM  Marisue IvanLiz [phone] on 07/28/20 at 11.19 am message left, updated 07/29/20    Code Status : Full  Consults  : Psychiatry, neurology  PUD Prophylaxis : None   Procedures  :     MRI Brain C&T Spine.  Nonacute.      Disposition Plan  :    Status is: Inpatient  Remains inpatient appropriate because:IV treatments appropriate due to intensity of illness or inability to take PO  Dispo: The patient is from: Home              Anticipated d/c is to: SNF              Patient currently is not medically stable to d/c.   Difficult to place patient No  DVT Prophylaxis  :    enoxaparin (LOVENOX) injection 40 mg Start: 07/21/20 1200 SCDs Start: 07/15/20 0036   Lab Results  Component Value Date   PLT 341 07/28/2020     Diet :  Diet Order             Diet regular Room service appropriate? Yes; Fluid consistency: Thin  Diet effective now                    Inpatient Medications  Scheduled Meds:  brinzolamide  1 drop Left Eye BID   enoxaparin (LOVENOX) injection  40 mg Subcutaneous Q24H   feeding supplement  237 mL Oral BID BM   FLUoxetine  20 mg Oral Daily   latanoprost  1 drop Left Eye QHS   megestrol  400 mg Oral Daily   multivitamin with minerals  1 tablet Oral Daily   simvastatin  20 mg Oral Daily   traZODone  25 mg Oral QHS   venlafaxine XR  37.5 mg Oral Q breakfast   Continuous Infusions:   PRN Meds:.acetaminophen **OR** acetaminophen, hydrOXYzine, polyethylene glycol  Antibiotics  :    Anti-infectives (From admission, onward)    Start     Dose/Rate Route Frequency Ordered Stop   07/15/20 2000  cefTRIAXone (ROCEPHIN) 1 g in sodium chloride 0.9 % 100 mL IVPB  Status:  Discontinued        1 g 200 mL/hr over 30 Minutes Intravenous Every 24 hours 07/15/20 0000 07/15/20 1635   07/14/20 2315  cefTRIAXone (ROCEPHIN) 1 g in sodium chloride 0.9 % 100 mL IVPB        1 g 200 mL/hr over 30 Minutes Intravenous  Once 07/14/20 2307 07/15/20 0040        Time Spent in minutes  30   Susa RaringPrashant Buddie Marston M.D on 07/30/2020 at 10:45 AM  To page go to [email or web address]   [facility] -  Office  [phone]  See all Orders from today for further  details    Objective:   Vitals:   07/29/20 1954 07/30/20 0003 07/30/20 0321 07/30/20 0823  BP: 130/79 125/76 139/80 126/76  Pulse: 94 80 77 77  Resp: 17 17 18 16   Temp: 97.9 F (36.6 C) 98 F (36.7 C) 98 F (36.7 C) 98 F (36.7 C)  TempSrc: Oral Oral Oral Oral  SpO2: 98% 99% 100% 97%  Weight:      Height:        Wt Readings from Last 3 Encounters:  07/14/20 51.3 kg  05/31/20 54.4 kg  05/27/20 52.8 kg     Intake/Output Summary (Last 24 hours) at 07/30/2020 1045 Last data filed at 07/30/2020 0626 Gross per 24 hour  Intake --   Output 500 ml  Net -500 ml    Physical Exam  Awake, alert x 2, bilateral lower extremity strength 4/5 with increased tone and hyperreflexia, flat affect .AT,PERRAL Supple Neck,No JVD, No cervical lymphadenopathy appriciated.  Symmetrical Chest wall movement, Good air movement bilaterally, CTAB RRR,No Gallops, Rubs or new Murmurs, No Parasternal Heave +ve B.Sounds, Abd Soft, No tenderness, No organomegaly appriciated, No rebound - guarding or rigidity. No Cyanosis, Clubbing or edema, No new Rash or bruise     Data Review:    CBC Recent Labs  Lab 07/24/20 0403 07/25/20 0102 07/28/20 0153  WBC 6.6 6.9 7.1  HGB 10.8* 10.6* 10.6*  HCT 33.3* 32.6* 32.6*  PLT 296 321 341  MCV 88.3 87.9 88.8  MCH 28.6 28.6 28.9  MCHC 32.4 32.5 32.5  RDW 14.6 14.6 14.7  LYMPHSABS 2.0 1.9  --   MONOABS 0.6 0.6  --   EOSABS 0.1 0.1  --   BASOSABS 0.0 0.0  --     Recent Labs  Lab 07/24/20 0403 07/25/20 0102 07/28/20 0153  NA 134* 133* 133*  K 3.9 4.3 4.5  CL 107 105 104  CO2 21* 21* 23  GLUCOSE 101* 99 105*  BUN 12 17 24*  CREATININE 0.64 0.70 0.78  CALCIUM 9.0 8.9 9.1  AST 14* 15  --   ALT 13 13  --   ALKPHOS 42 49  --   BILITOT 0.5 0.3  --   ALBUMIN 2.8* 2.7*  --   MG 2.0 2.0 2.1  BNP 98.4 77.2  --     ------------------------------------------------------------------------------------------------------------------ No results for input(s): CHOL, HDL, LDLCALC, TRIG, CHOLHDL, LDLDIRECT in the last 72 hours.  No results found for: HGBA1C ------------------------------------------------------------------------------------------------------------------ No results for input(s): TSH, T4TOTAL, T3FREE, THYROIDAB in the last 72 hours.  Invalid input(s): FREET3   Cardiac Enzymes No results for input(s): CKMB, TROPONINI, MYOGLOBIN in the last 168 hours.  Invalid input(s):  CK ------------------------------------------------------------------------------------------------------------------    Component Value Date/Time   BNP 77.2 07/25/2020 0102     Radiology Reports  DG Chest 2 View  Result Date: 07/14/2020 CLINICAL DATA:  Failure to thrive, weight loss EXAM: CHEST - 2 VIEW COMPARISON:  Radiograph 05/31/2020 FINDINGS: Suspect a small calcified mediastinal lymph node seen on lateral radiograph. Remaining cardiomediastinal contours are unremarkable. Bandlike opacity in the right lung base, likely scarring given similar to prior. No consolidation, features of edema, pneumothorax, or effusion. Degenerative changes are present in the imaged spine and shoulders. Evidence of calcific tendinosis in the left shoulder. Left cervical carotid atherosclerosis. IMPRESSION: Suspect a small calcified mediastinal/hilar node. Can reflect sequela of prior granulomatous disease. No acute cardiopulmonary abnormality. Electronically Signed   By: 06/02/2020 M.D.   On: 07/14/2020 20:56   MR BRAIN  WO CONTRAST  Result Date: 07/14/2020 CLINICAL DATA:  Mental status change. EXAM: MRI HEAD WITHOUT CONTRAST TECHNIQUE: Multiplanar, multiecho pulse sequences of the brain and surrounding structures were obtained without intravenous contrast. COMPARISON:  None. FINDINGS: Brain: No acute infarction, acute hemorrhage, hydrocephalus, extra-axial collection or mass lesion. Moderate scattered T2/FLAIR hyperintensities within the white matter, which are nonspecific but most likely related to chronic microvascular ischemic disease given patient age. Mild-to-moderate atrophy with ex vacuo ventricular dilation. The callosal angle is not acute and there is no crowding of sulci at the vertex. Dilated perivascular space in the inferior left basal ganglia. Small focus of susceptibility artifact in the lateral left cerebellum, most likely a prior microhemorrhage. Vascular: Major arterial flow voids are maintained  skull base. Skull and upper cervical spine: Normal marrow signal. Sinuses/Orbits: Visualized sinuses are clear. No acute orbital findings. Other: Small left mastoid effusion. IMPRESSION: 1. No evidence of acute intracranial abnormality 2. Moderate chronic microvascular ischemic disease and mild-to-moderate atrophy. 3. Small left mastoid effusion. Electronically Signed   By: Feliberto Harts MD   On: 07/14/2020 20:11   MR CERVICAL SPINE W WO CONTRAST  Result Date: 07/22/2020 CLINICAL DATA:  Hyper-reflexia and abnormal gait EXAM: MRI CERVICAL AND THORACIC SPINE WITHOUT AND WITH CONTRAST TECHNIQUE: Multiplanar and multiecho pulse sequences of the cervical spine, to include the craniocervical junction and cervicothoracic junction, and the thoracic spine, were obtained without and with intravenous contrast. CONTRAST:  67mL GADAVIST GADOBUTROL 1 MMOL/ML IV SOLN COMPARISON:  None. FINDINGS: MRI CERVICAL SPINE FINDINGS Alignment: Physiologic. Vertebrae: No fracture, evidence of discitis, or bone lesion. Cord: Normal signal and morphology. Posterior Fossa, vertebral arteries, paraspinal tissues: Negative. Disc levels: Axial images are severely motion degraded. C1-2: Unremarkable. C2-3: Normal disc space and facet joints. There is no spinal canal stenosis. No neural foraminal stenosis. C3-4: Normal disc space and facet joints. There is no spinal canal stenosis. No neural foraminal stenosis. C4-5: Normal disc space and facet joints. There is no spinal canal stenosis. No neural foraminal stenosis. C5-6: Small left asymmetric disc bulge. There is no spinal canal stenosis. Mild left neural foraminal stenosis. C6-7: Left asymmetric disc bulge with uncovertebral spurring. There is no spinal canal stenosis. Moderate left neural foraminal stenosis. C7-T1: Normal disc space and facet joints. There is no spinal canal stenosis. No neural foraminal stenosis. MRI THORACIC SPINE FINDINGS Alignment:  Physiologic. Vertebrae: No fracture,  evidence of discitis, or bone lesion. Cord:  Normal signal and morphology. Paraspinal and other soft tissues: Negative. Disc levels: Severely motion degraded axial images. No spinal canal stenosis. There is a small central disc protrusion noted at T7-8. No abnormal contrast enhancement. IMPRESSION: 1. Motion degraded study. 2. No acute abnormality of the cervical or thoracic spine. 3. Moderate left C6-7 and mild left C5-6 neural foraminal stenosis. 4. No spinal canal stenosis. Electronically Signed   By: Deatra Robinson M.D.   On: 07/22/2020 21:07   MR THORACIC SPINE W WO CONTRAST  Result Date: 07/22/2020 CLINICAL DATA:  Hyper-reflexia and abnormal gait EXAM: MRI CERVICAL AND THORACIC SPINE WITHOUT AND WITH CONTRAST TECHNIQUE: Multiplanar and multiecho pulse sequences of the cervical spine, to include the craniocervical junction and cervicothoracic junction, and the thoracic spine, were obtained without and with intravenous contrast. CONTRAST:  76mL GADAVIST GADOBUTROL 1 MMOL/ML IV SOLN COMPARISON:  None. FINDINGS: MRI CERVICAL SPINE FINDINGS Alignment: Physiologic. Vertebrae: No fracture, evidence of discitis, or bone lesion. Cord: Normal signal and morphology. Posterior Fossa, vertebral arteries, paraspinal tissues: Negative. Disc levels: Axial images  are severely motion degraded. C1-2: Unremarkable. C2-3: Normal disc space and facet joints. There is no spinal canal stenosis. No neural foraminal stenosis. C3-4: Normal disc space and facet joints. There is no spinal canal stenosis. No neural foraminal stenosis. C4-5: Normal disc space and facet joints. There is no spinal canal stenosis. No neural foraminal stenosis. C5-6: Small left asymmetric disc bulge. There is no spinal canal stenosis. Mild left neural foraminal stenosis. C6-7: Left asymmetric disc bulge with uncovertebral spurring. There is no spinal canal stenosis. Moderate left neural foraminal stenosis. C7-T1: Normal disc space and facet joints. There is  no spinal canal stenosis. No neural foraminal stenosis. MRI THORACIC SPINE FINDINGS Alignment:  Physiologic. Vertebrae: No fracture, evidence of discitis, or bone lesion. Cord:  Normal signal and morphology. Paraspinal and other soft tissues: Negative. Disc levels: Severely motion degraded axial images. No spinal canal stenosis. There is a small central disc protrusion noted at T7-8. No abnormal contrast enhancement. IMPRESSION: 1. Motion degraded study. 2. No acute abnormality of the cervical or thoracic spine. 3. Moderate left C6-7 and mild left C5-6 neural foraminal stenosis. 4. No spinal canal stenosis. Electronically Signed   By: Deatra Robinson M.D.   On: 07/22/2020 21:07   DG Hip Unilat W or Wo Pelvis 2-3 Views Left  Result Date: 07/14/2020 CLINICAL DATA:  Failure to thrive and weight loss EXAM: DG HIP (WITH OR WITHOUT PELVIS) 2-3V LEFT COMPARISON:  01/01/2020 FINDINGS: Pelvic ring is intact. Old healed pubic rami fractures are noted on the left stable from prior CT examination. Degenerative changes of the hip joints are noted right greater than left. No acute fracture or dislocation in the left hip is noted. Some widening of the left sacroiliac joint is noted which appears more prominent than that seen on prior CT. IMPRESSION: Slight widening of the left sacroiliac joint which may be projectional in nature. Degenerative changes of the hip joints bilaterally right greater than left. No acute fracture is seen. Old fractures in the left pubic rami are seen. Electronically Signed   By: Alcide Clever M.D.   On: 07/14/2020 20:56

## 2020-07-30 NOTE — TOC Progression Note (Signed)
Transition of Care [facility]) - Progression Note    Patient Details  Name: Sheila Andrade MRN: [id number] Date of Birth: [birth year]  [facility] CM/SW Contact  Kermit Balo, RN Phone Number: 07/30/2020, 9:17 AM  Clinical Narrative:    CM has spoken to admissions at [facility] and they dont take outside admissions. CM has left anther voicemail for [facility]. CM has left voicemail for [facility] to see if they have any beds available.  CM has messaged [facility] to see if they are able to offer on the patient.  [facility] to update CM if able to offer (CM asked them to re review).  TOC following.   Expected Discharge Plan: Skilled Nursing Facility Barriers to Discharge: SNF Pending bed offer  Expected Discharge Plan and Services Expected Discharge Plan: Skilled Nursing Facility   Discharge Planning Services: CM Consult Post Acute Care Choice: Skilled Nursing Facility Living arrangements for the past 2 months: Single Family Home                                       Social Determinants of Health (SDOH) Interventions    Readmission Risk Interventions No flowsheet data found.

## 2020-07-30 NOTE — Plan of Care (Signed)

## 2020-07-30 NOTE — Consult Note (Signed)
[facility] Face-to-Face Psychiatry Consult   Reason for Consult:  "severe depression/intermittent suicidal ideation" Referring Physician:  Glade Lloyd Patient Identification: Sheila Andrade MRN:  [id number] Principal Diagnosis: MDD (major depressive disorder), recurrent episode, severe (HCC) Diagnosis:  Principal Problem:   MDD (major depressive disorder), recurrent episode, severe (HCC) Active Problems:   Weight loss   Anemia   UTI (urinary tract infection)   Hyponatremia   Suicidal ideations   Dehydration   Protein-calorie malnutrition, severe   Total Time spent with patient: 20 minutes  Subjective:   Sheila Andrade is a 85 y.o. female patient admitted due to confusion and inability to stand as well as weakness and loss of balance.  HPI:  Pt is a 85 yo female w/ depression secondary to medical problems. Pt seen at bedside this morning. Pt doing well. Pt alert and oriented.  Pt denies SI/HI/AVH.   Past Psychiatric History: MDD  Risk to Self:   No Risk to Others:   No Prior Inpatient Therapy:   None Prior Outpatient Therapy:   yes  Past Medical History:  Past Medical History:  Diagnosis Date   Hot flashes 06/06/2020   Hypertension    History reviewed. No pertinent surgical history. Family History:  Family History  Problem Relation Age of Onset   Hypertension Other    Family Psychiatric  History: n/a Social History:  Social History   Substance and Sexual Activity  Alcohol Use None     Social History   Substance and Sexual Activity  Drug Use Never    Social History   Socioeconomic History   Marital status: Widowed    Spouse name: Not on file   Number of children: Not on file   Years of education: Not on file   Highest education level: Not on file  Occupational History   Not on file  Tobacco Use   Smoking status: Never   Smokeless tobacco: Never  Substance and Sexual Activity   Alcohol use: Not on file   Drug use: Never   Sexual activity: Not on file   Other Topics Concern   Not on file  Social History Narrative   Not on file   Social Determinants of Health   Financial Resource Strain: Not on file  Food Insecurity: Not on file  Transportation Needs: Not on file  Physical Activity: Not on file  Stress: Not on file  Social Connections: Not on file   Additional Social History:    Allergies:  No Known Allergies  Labs:  No results found for this or any previous visit (from the past 48 hour(s)).   Current Facility-Administered Medications  Medication Dose Route Frequency Provider Last Rate Last Admin   acetaminophen (TYLENOL) tablet 650 mg  650 mg Oral Q6H PRN Therisa Doyne, MD   650 mg at 07/30/20 0908   Or   acetaminophen (TYLENOL) suppository 650 mg  650 mg Rectal Q6H PRN Doutova, Anastassia, MD       brinzolamide (AZOPT) 1 % ophthalmic suspension 1 drop  1 drop Left Eye BID Doutova, Anastassia, MD   1 drop at 07/30/20 0910   enoxaparin (LOVENOX) injection 40 mg  40 mg Subcutaneous Q24H Alekh, Kshitiz, MD   40 mg at 07/30/20 1209   feeding supplement (ENSURE ENLIVE / ENSURE PLUS) liquid 237 mL  237 mL Oral BID BM Doutova, Anastassia, MD   237 mL at 07/30/20 0910   FLUoxetine (PROZAC) capsule 20 mg  20 mg Oral Daily Park Pope, MD   20  mg at 07/30/20 0908   hydrOXYzine (ATARAX/VISTARIL) tablet 25 mg  25 mg Oral TID PRN Bobbye Morton, MD       latanoprost (XALATAN) 0.005 % ophthalmic solution 1 drop  1 drop Left Eye QHS Doutova, Jonny Ruiz, MD   1 drop at 07/29/20 2108   megestrol (MEGACE) 400 MG/10ML suspension 400 mg  400 mg Oral Daily Glade Lloyd, MD   400 mg at 07/30/20 1610   multivitamin with minerals tablet 1 tablet  1 tablet Oral Daily Glade Lloyd, MD   1 tablet at 07/30/20 0909   polyethylene glycol (MIRALAX / GLYCOLAX) packet 17 g  17 g Oral Daily PRN Calvert Cantor, MD   17 g at 07/29/20 1232   simvastatin (ZOCOR) tablet 20 mg  20 mg Oral Daily Doutova, Anastassia, MD   20 mg at 07/30/20 9604   traZODone  (DESYREL) tablet 25 mg  25 mg Oral QHS Doutova, Anastassia, MD   25 mg at 07/29/20 2105   venlafaxine XR (EFFEXOR-XR) 24 hr capsule 37.5 mg  37.5 mg Oral Q breakfast Akintayo, Mojeed, MD   37.5 mg at 07/30/20 0908     Psychiatric Specialty Exam:  Presentation  General Appearance: Appropriate for Environment; Casual  Eye Contact:Minimal  Speech:Slow; Clear and Coherent  Speech Volume:Decreased   Mood and Affect  Mood:Depressed; Dysphoric  Affect:Depressed; Flat   Thought Process  Thought Processes:Coherent; Linear; Goal Directed  Descriptions of Associations:Intact  Orientation:Full (Time, Place and Person)  Thought Content:Logical  History of Schizophrenia/Schizoaffective disorder:No data recorded Duration of Psychotic Symptoms:No data recorded Hallucinations:No data recorded Ideas of Reference:None  Suicidal Thoughts:No data recorded Homicidal Thoughts:No data recorded  Sensorium  Memory:Recent Good; Immediate Good; Remote Good  Judgment:Fair  Insight:Fair   Executive Functions  Concentration:Fair  Attention Span:Good  Recall:Good  Fund of Knowledge:Good  Language:Good   Psychomotor Activity  Psychomotor Activity: No data recorded  Assets  Assets:Desire for Improvement; Manufacturing systems engineer; Housing; Social Support; Resilience; Physical Health; Leisure Time; Vocational/Educational; Talents/Skills; Transportation; Financial Resources/Insurance   Sleep  Sleep: No data recorded  Physical Exam: Physical Exam Vitals reviewed.  Constitutional:      Appearance: Normal appearance.  HENT:     Head: Normocephalic and atraumatic.  Eyes:     Extraocular Movements: Extraocular movements intact.  Cardiovascular:     Rate and Rhythm: Normal rate and regular rhythm.  Pulmonary:     Effort: Pulmonary effort is normal.     Breath sounds: Normal breath sounds.  Abdominal:     General: Abdomen is flat.     Palpations: Abdomen is soft.   Musculoskeletal:     Cervical back: Normal range of motion.  Skin:    General: Skin is warm and dry.  Neurological:     General: No focal deficit present.     Mental Status: She is alert and oriented to person, place, and time.   Review of Systems  Constitutional:  Negative for chills and fever.  Respiratory:  Negative for cough, shortness of breath and wheezing.   Cardiovascular:  Negative for chest pain and palpitations.  Gastrointestinal:  Negative for abdominal pain, nausea and vomiting.  Skin:  Negative for itching and rash.  Neurological:  Negative for dizziness and headaches.  Blood pressure 107/73, pulse 83, temperature 98 F (36.7 C), temperature source Oral, resp. rate 18, height 5\' 7"  (1.702 m), weight 51.3 kg, SpO2 100 %. Body mass index is 17.7 kg/m.  Treatment Plan Summary: Pt is a 85 yo female w/ depression  secondary to medical problems. Pt doing well. Prozac dose increasing to 20 mg today and effexor taper to 37.5 x 1 week.   Depression related to medical problems vs MDD -Continue Effexor XR 37.5 mg qd until 08/04/20 then stop. -Continue Prozac 20 mg qd for depression -Continue Megace 400 mg daily for appetite stimulation -Continue Trazodone 25 mg at bedtime for sleep -Recommend outpatient psychiatry referral for med management upon discharge -Recommend Atarax/Vistaril 25 mg tid prn for anxiety -Recommend avoiding Ativan for patient as patient becomes too sedated with Ativan -signing off  Disposition: No evidence of imminent risk to self or others at present.   Patient does not meet criteria for psychiatric inpatient admission.  Thank you for the consult Park Pope, MD PGY1 Psychiatry Resident 07/30/2020 12:19 PM

## 2020-07-30 NOTE — Progress Notes (Signed)
Nutrition Follow-up  DOCUMENTATION CODES:   Severe malnutrition in context of chronic illness  INTERVENTION:  -Continue Ensure Enlive po TID, each supplement provides 350 kcal and 20 grams of protein -Continue MVI with minerals daily  NUTRITION DIAGNOSIS:   Severe Malnutrition related to chronic illness (MDD) as evidenced by severe fat depletion, severe muscle depletion.  ongoing  GOAL:   Patient will meet greater than or equal to 90% of their needs  progressing  MONITOR:   PO intake, Supplement acceptance, Labs, Weight trends, I & O's  REASON FOR ASSESSMENT:   Consult Assessment of nutrition requirement/status  ASSESSMENT:   85 y.o. female with a history of depression who presented to the ED with siucidal ideation in the setting of continued functional decline, debility associated with tremors, bradykinesia. Urinalysis suggestive of UTI for which ceftriaxone was initially provided though with no reported symptoms, stopped awaiting urine culture. Due to [facility], psychiatry consulted initially recommending inpatient psychiatric stabilization, but the patient is no longer suicidal and committed to rehabilitation.  7/18 tx to [facility] for continued neurology workup  Per MD, pt is now medically stable for discharge, pending bed availability. Pt denies N/V/abdominal pain at this time. Pt eager to discharge. Oral intake has improved since beginning Megace. Last 8 meal completions charted as 0-100% (~54% average meal intake). Per RN, pt doing well with Ensure supplements   Medications: megace, mvi with minerals Labs reviewed.   UOP: x24 hours I/O: -3.7L since admit  Diet Order:   Diet Order             Diet regular Room service appropriate? Yes; Fluid consistency: Thin  Diet effective now                   EDUCATION NEEDS:   No education needs have been identified at this time  Skin:  Skin Assessment: Reviewed RN Assessment  Last BM:  7/21  Height:   Ht  Readings from Last 1 Encounters:  07/14/20 5\' 7"  (1.702 m)    Weight:   Wt Readings from Last 1 Encounters:  07/14/20 51.3 kg    BMI:  Body mass index is 17.7 kg/m.  Estimated Nutritional Needs:   Kcal:  1500-1700  Protein:  65-80 grams  Fluid:  1.6L/day    09/14/20, MS, RD, LDN (she/her/hers) RD pager number and weekend/on-call pager number located in Amion.

## 2020-07-31 DIAGNOSIS — F332 Major depressive disorder, recurrent severe without psychotic features: Secondary | ICD-10-CM | POA: Diagnosis not present

## 2020-07-31 NOTE — Progress Notes (Signed)
Physical Therapy Treatment Patient Details Name: Sheila Andrade MRN: [id number] DOB: 09/08/1935 Today's Date: 07/31/2020    History of Present Illness Pt is 85 y.o. female adm with recurrent UTI, SI, and FTT on 07/14/20 PMH:  recent COVID, glaucoma osteoporosis, HLD, depression . Pysch consulted    PT Comments    Pt extremely motivated to work with therapy today.  Session focused on improving sit to stands and stand pivots.  Pt requiring mod A initially improving to min A with practice and cues.  Did work ok gait with assist to weight shifting in order to take longer strides.  Did note insurance denied CIR , updated recommendation to SNF.     Follow Up Recommendations  SNF (insurance denied CIR)     Equipment Recommendations  Wheelchair cushion (measurements PT);Wheelchair (measurements PT);3in1 (PT)    Recommendations for Other Services       Precautions / Restrictions Precautions Precautions: Fall Precaution Comments: tends to lean posteriorly Restrictions Weight Bearing Restrictions: No    Mobility  Bed Mobility Overal bed mobility: Needs Assistance Bed Mobility: Sit to Supine       Sit to supine: Min assist        Transfers Overall transfer level: Needs assistance Equipment used: Rolling walker (2 wheeled) Transfers: Sit to/from [employer] Sit to Stand: Min assist;Mod assist Stand pivot transfers: Min assist;Mod assist       General transfer comment: Pt wanting to practice multiple sit to stands and stand pivots.  Performed 4 stand pivots from bed to chair and back and 1 stand pivot from bed to bsc and back (10 total) and 18 stands total.  To stand: cues for hand placement, to lean forward, and to tuck buttock and pull back shoulder to complete stand.  Cues for RW and to keep buttock tucked for stand pivot.  To sit: cued to always turn all the way until both legs touch surface and reach back.  Pt initially mod A progressing to min A but with  increased time to rise.  Ambulation/Gait Ambulation/Gait assistance: Min assist;+2 safety/equipment Gait Distance (Feet): 22 Feet (22'x2) Assistive device: Rolling walker (2 wheeled) Gait Pattern/deviations: Step-to pattern;Decreased stance time - left;Decreased weight shift to left;Shuffle Gait velocity: decr   General Gait Details: Pt with shuffle almost festinating pattern.  Cued to slow and take bigger steps with assist to weight shift.  Had chair follow.  22'x2   Stairs             Wheelchair Mobility    Modified Rankin (Stroke Patients Only)       Balance Overall balance assessment: Needs assistance Sitting-balance support: Feet supported;No upper extremity supported Sitting balance-Leahy Scale: Good     Standing balance support: Bilateral upper extremity supported;During functional activity Standing balance-Leahy Scale: Poor Standing balance comment: Required RW.  On initial stand pt pushing legs into bed, leaning posteriorly at hips requiring tactile cues at pelvis and verbal cues for upright posture. Requiring mod A initially progressing to min guard- light min A                            Cognition Arousal/Alertness: Awake/alert Behavior During Therapy: WFL for tasks assessed/performed Overall Cognitive Status: Impaired/Different from baseline                         Following Commands: Follows one step commands with increased time     Problem  Solving: Slow processing General Comments: Needs repetition      Exercises      General Comments General comments (skin integrity, edema, etc.): Pt very motivated and states "again" to practicing transfers      Pertinent Vitals/Pain Pain Assessment: No/denies pain Pain Intervention(s): Monitored during session    Home Living                      Prior Function            PT Goals (current goals can now be found in the care plan section) Acute Rehab PT Goals Patient  Stated Goal: to improve standing and transfesr PT Goal Formulation: With patient Time For Goal Achievement: 08/12/20 Potential to Achieve Goals: Good Progress towards PT goals: Progressing toward goals    Frequency    Min 3X/week      PT Plan Current plan remains appropriate    Co-evaluation              AM-PAC PT "6 Clicks" Mobility   Outcome Measure  Help needed turning from your back to your side while in a flat bed without using bedrails?: A Little Help needed moving from lying on your back to sitting on the side of a flat bed without using bedrails?: A Little Help needed moving to and from a bed to a chair (including a wheelchair)?: A Little Help needed standing up from a chair using your arms (e.g., wheelchair or bedside chair)?: A Little Help needed to walk in hospital room?: A Lot Help needed climbing 3-5 steps with a railing? : A Lot 6 Click Score: 16    End of Session Equipment Utilized During Treatment: Gait belt Activity Tolerance: Patient tolerated treatment well Patient left: with call bell/phone within reach;with nursing/sitter in room;in bed;with bed alarm set Nurse Communication: Mobility status PT Visit Diagnosis: Other abnormalities of gait and mobility (R26.89);Difficulty in walking, not elsewhere classified (R26.2);Unsteadiness on feet (R26.81);Muscle weakness (generalized) (M62.81)     Time: 4098-1191 PT Time Calculation (min) (ACUTE ONLY): 26 min  Charges:  $Gait Training: 8-22 mins $Therapeutic Activity: 8-22 mins                     Anise Salvo, PT Acute Rehab Services Pager [phone] [facility] [phone]    Rayetta Humphrey 07/31/2020, 4:20 PM

## 2020-07-31 NOTE — Progress Notes (Signed)
PROGRESS NOTE    Sheila Andrade  RAQ:[id number] DOB: Sep 04, 1935 DOA: 07/14/2020 PCP: Dalphine Handing, MD   Chief Complain: Failure to thrive, fatigue  Brief Narrative:  Patient is a 85 year old female with history of depression, functional decline, recent history of pelvic fracture, tremor/bradykinesia was initially brought out in for suicidal ideation, decreased oral intake, generalized deconditioning.  She had a prolonged hospital stay.  Neurology and psychiatry were also following her during this hospitalization for her mental status changes, psychiatric history.  Her medications have been adjusted.  PT/OT have recommended CIR/skilled nursing facility on discharge.  She is waiting for bed.  She is medically stable for discharge whenever bed is available.  Assessment & Plan:   Principal Problem:   MDD (major depressive disorder), recurrent episode, severe (HCC) Active Problems:   Weight loss   Anemia   UTI (urinary tract infection)   Hyponatremia   Suicidal ideations   Dehydration   Protein-calorie malnutrition, severe   Severe depression/suicidal ideation: Psychiatry has been following her.  Currently she is not actively suicidal or homicidal but still significantly anxious.  Psychiatry recommendation as below: Continue Effexor XR 37.5 mg qd until 08/04/20 then stop. -Continue Prozac 20 mg qd for depression -Continue Megace 400 mg daily for appetite stimulation -Continue Trazodone 25 mg at bedtime for sleep -Recommend Atarax/Vistaril 25 mg tid prn for anxiety -Recommend avoiding Ativan for patient as patient becomes too sedated with Ativan -Recommend outpatient psychiatry referral for med management upon discharge  She continues to require sitter at the bedside.  Failure to thrive/deconditioning/functional decline/decreased oral intake: Continue to encourage oral intake, supplementation.  On Megace for appetite enhancement.  Bradykinesia/hyperreflexia on lower  extremities/poor balance: MRI of the brain, C/L-spine studies were unremarkable.  Neurology was following.  Outpatient neurology work-up recommended.  PT/OT recommended CIR but had insurance denial so planning for skilled nursing facility on discharge. Difficulty in disposition due to patient's psychiatric history.  Vitamin D deficiency: Continue supplementation  Moderate to severe protein calorie moderation: Secondary to decreased oral intake.  Continue oral supplements.  Dietitian following     Nutrition Problem: Severe Malnutrition Etiology: chronic illness (MDD)      DVT prophylaxis:Lovenox Code Status: Full Family Communication: None at bedside Status is: Inpatient  Remains inpatient appropriate because:Unsafe d/c plan  Dispo: The patient is from: Home              Anticipated d/c is to: SNF              Patient currently is medically stable to d/c.   Difficult to place patient Yes      Consultants: Psychiatry, neurology  Procedures: None  Antimicrobials:  Anti-infectives (From admission, onward)    Start     Dose/Rate Route Frequency Ordered Stop   07/15/20 2000  cefTRIAXone (ROCEPHIN) 1 g in sodium chloride 0.9 % 100 mL IVPB  Status:  Discontinued        1 g 200 mL/hr over 30 Minutes Intravenous Every 24 hours 07/15/20 0000 07/15/20 1635   07/14/20 2315  cefTRIAXone (ROCEPHIN) 1 g in sodium chloride 0.9 % 100 mL IVPB        1 g 200 mL/hr over 30 Minutes Intravenous  Once 07/14/20 2307 07/15/20 0040       Subjective:  Patient seen and examined at the bedside this morning.  Hemodynamically stable.  Comfortable, lying in bed.  Sitter at bedside.  She denies any new complaints today.  She was wondering about her discharge  date and place.   Objective: Vitals:   07/30/20 2023 07/30/20 2337 07/31/20 0337 07/31/20 0749  BP: 123/66 138/80 135/78 114/68  Pulse: 79 76 72 85  Resp: 17 17 18 18   Temp: 98.2 F (36.8 C) 97.9 F (36.6 C) 98.5 F (36.9 C) 98.3 F  (36.8 C)  TempSrc: Oral Oral Oral Oral  SpO2: 98% 99% 99% 100%  Weight:      Height:        Intake/Output Summary (Last 24 hours) at 07/31/2020 1147 Last data filed at 07/31/2020 0800 Gross per 24 hour  Intake --  Output 1200 ml  Net -1200 ml   Filed Weights   07/14/20 1755  Weight: 51.3 kg    Examination:  General exam: Overall comfortable, not in distress, chronically ill looking, elderly female HEENT: PERRL, bruising around the left periorbital region Respiratory system:  no wheezes or crackles  Cardiovascular system: S1 & S2 heard, RRR.  Gastrointestinal system: Abdomen is nondistended, soft and nontender. Central nervous system: Alert and oriented, flat affect Extremities: No edema, no clubbing ,no cyanosis Skin: No rashes, no ulcers,no icterus       Data Reviewed: I have personally reviewed following labs and imaging studies  CBC: Recent Labs  Lab 07/25/20 0102 07/28/20 0153  WBC 6.9 7.1  NEUTROABS 4.3  --   HGB 10.6* 10.6*  HCT 32.6* 32.6*  MCV 87.9 88.8  PLT 321 341   Basic Metabolic Panel: Recent Labs  Lab 07/25/20 0102 07/28/20 0153  NA 133* 133*  K 4.3 4.5  CL 105 104  CO2 21* 23  GLUCOSE 99 105*  BUN 17 24*  CREATININE 0.70 0.78  CALCIUM 8.9 9.1  MG 2.0 2.1   GFR: Estimated Creatinine Clearance: 43.9 mL/min (by C-G formula based on SCr of 0.78 mg/dL). Liver Function Tests: Recent Labs  Lab 07/25/20 0102  AST 15  ALT 13  ALKPHOS 49  BILITOT 0.3  PROT 5.1*  ALBUMIN 2.7*   No results for input(s): LIPASE, AMYLASE in the last 168 hours. No results for input(s): AMMONIA in the last 168 hours. Coagulation Profile: No results for input(s): INR, PROTIME in the last 168 hours. Cardiac Enzymes: No results for input(s): CKTOTAL, CKMB, CKMBINDEX, TROPONINI in the last 168 hours. BNP (last 3 results) No results for input(s): PROBNP in the last 8760 hours. HbA1C: No results for input(s): HGBA1C in the last 72 hours. CBG: No results for  input(s): GLUCAP in the last 168 hours. Lipid Profile: No results for input(s): CHOL, HDL, LDLCALC, TRIG, CHOLHDL, LDLDIRECT in the last 72 hours. Thyroid Function Tests: No results for input(s): TSH, T4TOTAL, FREET4, T3FREE, THYROIDAB in the last 72 hours. Anemia Panel: No results for input(s): VITAMINB12, FOLATE, FERRITIN, TIBC, IRON, RETICCTPCT in the last 72 hours. Sepsis Labs: No results for input(s): PROCALCITON, LATICACIDVEN in the last 168 hours.  No results found for this or any previous visit (from the past 240 hour(s)).       Radiology Studies: No results found.      Scheduled Meds:  brinzolamide  1 drop Left Eye BID   enoxaparin (LOVENOX) injection  40 mg Subcutaneous Q24H   feeding supplement  237 mL Oral TID BM   FLUoxetine  20 mg Oral Daily   latanoprost  1 drop Left Eye QHS   megestrol  400 mg Oral Daily   multivitamin with minerals  1 tablet Oral Daily   simvastatin  20 mg Oral Daily   traZODone  25 mg  Oral QHS   venlafaxine XR  37.5 mg Oral Q breakfast   Continuous Infusions:   LOS: 15 days    Time spent: 25 mins.More than 50% of that time was spent in counseling and/or coordination of care.      Burnadette Pop, MD [facility] P7/27/2022, 11:47 AM

## 2020-07-31 NOTE — Progress Notes (Signed)
Occupational Therapy Treatment Patient Details Name: Sheila Andrade MRN: [id number] DOB: 1935-07-08 Today's Date: 07/31/2020    History of present illness 85 y.o. female adm with recurrent UTI PMH:  recent COVID, glaucoma osteoporosis, HLD, depression . Pysch consulted   OT comments  Patient with a little better retention of commands, and follow through for stand balance and use of RW for toileting transfers.  Patient needing increased time to gain stand balance, and up to Mod A of one for stand pivot transfers.  Patient is very eager to practice, and is motivated to regain her prior level of function.  CIR has been recommended, but OT is indicated for the acute setting to maximize her functional status.    Follow Up Recommendations  CIR    Equipment Recommendations  Other (comment)    Recommendations for Other Services      Precautions / Restrictions Precautions Precautions: Fall Precaution Comments: tends to lean posteriorly Restrictions Weight Bearing Restrictions: No       Mobility Bed Mobility                 Patient Response: Cooperative  Transfers     Transfers: Stand Pivot Transfers;Sit to/from Stand Sit to Stand: Mod assist Stand pivot transfers: Mod assist            Balance Overall balance assessment: Needs assistance Sitting-balance support: Feet supported;No upper extremity supported Sitting balance-Leahy Scale: Fair     Standing balance support: Bilateral upper extremity supported;During functional activity Standing balance-Leahy Scale: Poor                             ADL either performed or assessed with clinical judgement   ADL                           Toilet Transfer: Moderate assistance;RW Toilet Transfer Details (indicate cue type and reason): simulated toilet transfer this date, back and forth recliner to bed.           General ADL Comments: spent time working on scooting to the edge, leaning forward  and standing.  stood times 4 with RW with increased time for bringing hips forward and shifting weight forward.                       Cognition Arousal/Alertness: Awake/alert Behavior During Therapy: Anxious Overall Cognitive Status: Impaired/Different from baseline                         Following Commands: Follows one step commands with increased time                                  Pertinent Vitals/ Pain       Pain Assessment: No/denies pain Pain Intervention(s): Monitored during session                                                          Frequency    2x/wk       Progress Toward Goals  OT Goals(current goals can now be found in the care plan section)  Progress towards OT goals:  Progressing toward goals  Acute Rehab OT Goals Patient Stated Goal: to stand and transfer without the machine OT Goal Formulation: With patient/family Time For Goal Achievement: 08/12/20 Potential to Achieve Goals: Good  Plan Discharge plan remains appropriate    Co-evaluation                 AM-PAC OT "6 Clicks" Daily Activity     Outcome Measure   Help from another person eating meals?: A Little Help from another person taking care of personal grooming?: A Little Help from another person toileting, which includes using toliet, bedpan, or urinal?: A Lot Help from another person bathing (including washing, rinsing, drying)?: A Lot Help from another person to put on and taking off regular upper body clothing?: A Little Help from another person to put on and taking off regular lower body clothing?: A Lot 6 Click Score: 15    End of Session Equipment Utilized During Treatment: Gait belt;Rolling walker;Other (comment)  OT Visit Diagnosis: Other abnormalities of gait and mobility (R26.89);Muscle weakness (generalized) (M62.81);Other symptoms and signs involving cognitive function;Unsteadiness on feet (R26.81)   Activity  Tolerance Patient tolerated treatment well   Patient Left in bed;with call bell/phone within reach;with nursing/sitter in room;Other (comment) (PT entering for session)   Nurse Communication          Time: [id number] OT Time Calculation (min): 12 min  Charges: OT General Charges $OT Visit: 1 Visit OT Treatments $Therapeutic Activity: 8-22 mins  07/31/2020  Rich, OTR/L  Acute Rehabilitation Services  Office:  [phone]    Sheila Andrade 07/31/2020, 2:42 PM

## 2020-07-31 NOTE — Care Management Important Message (Signed)
Important Message  Patient Details  Name: Luva Metzger MRN: [id number] Date of Birth: 07-07-1935   Medicare Important Message Given:  Yes - Important Message mailed due to current National Emergency   Verbal consent obtained due to current National Emergency  Relationship to patient: Self Contact Name: Cameka Call Date: 07/31/20  Time: 1420 Phone: [phone] Outcome: No Answer/Busy Important Message mailed to: Patient address on file    Orson Aloe 07/31/2020, 2:20 PM

## 2020-08-01 DIAGNOSIS — F332 Major depressive disorder, recurrent severe without psychotic features: Secondary | ICD-10-CM | POA: Diagnosis not present

## 2020-08-01 MED ORDER — BIMATOPROST 0.01 % OP SOLN
1.0000 [drp] | Freq: Every day | OPHTHALMIC | Status: DC
  Administered 2020-08-02: 1 [drp] via OPHTHALMIC

## 2020-08-01 NOTE — Progress Notes (Signed)
PROGRESS NOTE    Sheila Andrade  ONG:[id number] DOB: [birth year] DOA: 07/14/2020 PCP: Dalphine Handing, MD   Chief Complain: Failure to thrive, fatigue  Brief Narrative:  Patient is a 85 year old female with history of depression, functional decline, recent history of pelvic fracture, tremor/bradykinesia was initially brought out in for suicidal ideation, decreased oral intake, generalized deconditioning.  She had a prolonged hospital stay.  Neurology and psychiatry were also following her during this hospitalization for her mental status changes, psychiatric history.  Her medications have been adjusted.  PT/OT have recommended CIR/skilled nursing facility on discharge.  She is waiting for bed.  She is medically stable for discharge whenever bed is available.  Assessment & Plan:   Principal Problem:   MDD (major depressive disorder), recurrent episode, severe (HCC) Active Problems:   Weight loss   Anemia   UTI (urinary tract infection)   Hyponatremia   Suicidal ideations   Dehydration   Protein-calorie malnutrition, severe   Severe depression/suicidal ideation: Psychiatry has been following her.  Currently she is not actively suicidal or homicidal but still significantly anxious.  Psychiatry recommendation as below: Continue Effexor XR 37.5 mg qd until 08/04/20 then stop. -Continue Prozac 20 mg qd for depression -Continue Megace 400 mg daily for appetite stimulation -Continue Trazodone 25 mg at bedtime for sleep -Recommend Atarax/Vistaril 25 mg tid prn for anxiety -Recommend avoiding Ativan for patient as patient becomes too sedated with Ativan -Recommend outpatient psychiatry referral for med management upon discharge  She continues to require sitter at the bedside.  Failure to thrive/deconditioning/functional decline/decreased oral intake: Continue to encourage oral intake, supplementation.  On Megace for appetite enhancement.  Bradykinesia/hyperreflexia on lower  extremities/poor balance: MRI of the brain, C/L-spine studies were unremarkable.  Neurology was following.  Outpatient neurology work-up recommended.  PT/OT recommended CIR but had insurance denial so planning for skilled nursing facility on discharge. Difficulty in disposition due to patient's psychiatric history.  Vitamin D deficiency: Continue supplementation  Moderate to severe protein calorie moderation: Secondary to decreased oral intake.  Continue oral supplements.  Dietitian following     Nutrition Problem: Severe Malnutrition Etiology: chronic illness (MDD)      DVT prophylaxis:Lovenox Code Status: Full Family Communication: called both  daughterCorrie Dandy) and daughter-in-law ( [name])on phone at 11:10 am, calls not received Status is: Inpatient  Remains inpatient appropriate because:Unsafe d/c plan  Dispo: The patient is from: Home              Anticipated d/c is to: SNF              Patient currently is medically stable to d/c.   Difficult to place patient Yes      Consultants: Psychiatry, neurology  Procedures: None  Antimicrobials:  Anti-infectives (From admission, onward)    Start     Dose/Rate Route Frequency Ordered Stop   07/15/20 2000  cefTRIAXone (ROCEPHIN) 1 g in sodium chloride 0.9 % 100 mL IVPB  Status:  Discontinued        1 g 200 mL/hr over 30 Minutes Intravenous Every 24 hours 07/15/20 0000 07/15/20 1635   07/14/20 2315  cefTRIAXone (ROCEPHIN) 1 g in sodium chloride 0.9 % 100 mL IVPB        1 g 200 mL/hr over 30 Minutes Intravenous  Once 07/14/20 2307 07/15/20 0040       Subjective:  Patient seen and examined the bedside this morning.  Hemodynamically stable.  Lying on bed.  Sitter at the bedside.  Denies  any complaints.   Objective: Vitals:   07/31/20 1719 07/31/20 2013 07/31/20 2354 08/01/20 0414  BP: 105/66 (!) 102/59 127/75 127/74  Pulse: 88 86 74 67  Resp: 18 18 17 18   Temp: 97.7 F (36.5 C) 98.2 F (36.8 C) 98.5 F (36.9 C)  98 F (36.7 C)  TempSrc: Oral Oral Oral Oral  SpO2: 98% 97% 98% 98%  Weight:      Height:        Intake/Output Summary (Last 24 hours) at 08/01/2020 0746 Last data filed at 07/31/2020 1900 Gross per 24 hour  Intake 474 ml  Output 1000 ml  Net -526 ml   Filed Weights   07/14/20 1755  Weight: 51.3 kg    Examination:  General exam: Overall comfortable, not in distress HEENT: PERRL Respiratory system:  no wheezes or crackles  Cardiovascular system: S1 & S2 heard, RRR.  Gastrointestinal system: Abdomen is nondistended, soft and nontender. Central nervous system: Alert and oriented,flat affect Extremities: No edema, no clubbing ,no cyanosis Skin: No rashes, no ulcers,no icterus      Data Reviewed: I have personally reviewed following labs and imaging studies  CBC: Recent Labs  Lab 07/28/20 0153  WBC 7.1  HGB 10.6*  HCT 32.6*  MCV 88.8  PLT 341   Basic Metabolic Panel: Recent Labs  Lab 07/28/20 0153  NA 133*  K 4.5  CL 104  CO2 23  GLUCOSE 105*  BUN 24*  CREATININE 0.78  CALCIUM 9.1  MG 2.1   GFR: Estimated Creatinine Clearance: 43.9 mL/min (by C-G formula based on SCr of 0.78 mg/dL). Liver Function Tests: No results for input(s): AST, ALT, ALKPHOS, BILITOT, PROT, ALBUMIN in the last 168 hours.  No results for input(s): LIPASE, AMYLASE in the last 168 hours. No results for input(s): AMMONIA in the last 168 hours. Coagulation Profile: No results for input(s): INR, PROTIME in the last 168 hours. Cardiac Enzymes: No results for input(s): CKTOTAL, CKMB, CKMBINDEX, TROPONINI in the last 168 hours. BNP (last 3 results) No results for input(s): PROBNP in the last 8760 hours. HbA1C: No results for input(s): HGBA1C in the last 72 hours. CBG: No results for input(s): GLUCAP in the last 168 hours. Lipid Profile: No results for input(s): CHOL, HDL, LDLCALC, TRIG, CHOLHDL, LDLDIRECT in the last 72 hours. Thyroid Function Tests: No results for input(s): TSH,  T4TOTAL, FREET4, T3FREE, THYROIDAB in the last 72 hours. Anemia Panel: No results for input(s): VITAMINB12, FOLATE, FERRITIN, TIBC, IRON, RETICCTPCT in the last 72 hours. Sepsis Labs: No results for input(s): PROCALCITON, LATICACIDVEN in the last 168 hours.  No results found for this or any previous visit (from the past 240 hour(s)).       Radiology Studies: No results found.      Scheduled Meds:  brinzolamide  1 drop Left Eye BID   enoxaparin (LOVENOX) injection  40 mg Subcutaneous Q24H   feeding supplement  237 mL Oral TID BM   FLUoxetine  20 mg Oral Daily   latanoprost  1 drop Left Eye QHS   megestrol  400 mg Oral Daily   multivitamin with minerals  1 tablet Oral Daily   simvastatin  20 mg Oral Daily   traZODone  25 mg Oral QHS   venlafaxine XR  37.5 mg Oral Q breakfast   Continuous Infusions:   LOS: 16 days    Time spent: 25 mins.More than 50% of that time was spent in counseling and/or coordination of care.  Burnadette Pop, MD [facility] P7/28/2022, 7:46 AM

## 2020-08-01 NOTE — TOC Progression Note (Signed)
Transition of Care [facility]) - Progression Note    Patient Details  Name: Sheila Andrade MRN: [id number] Date of Birth: [birth year]  [facility] CM/SW Contact  Kermit Balo, RN Phone Number: 08/01/2020, 2:49 PM  Clinical Narrative:    Daughter, Marisue Ivan updated on no bed offers at this time. CM refaxed her out to facilities here in [facility] yesterday without any offers.  CM asked about faxing her out to less than 5 star facilities. Marisue Ivan in agreement. CM also asked about the family taking the patient home with caregivers and Marisue Ivan said she needed to discuss that with her sister who will be back on Saturday. Cm left message with [facility]let[facility] Lor in [place] area. Cm left message with [facility].  CM faxed referral to [facility] at [facility]. CM faxed referral to [facility]. [facility] called and requested more information, which was faxed.  TOC following.   Expected Discharge Plan: Skilled Nursing Facility Barriers to Discharge: SNF Pending bed offer  Expected Discharge Plan and Services Expected Discharge Plan: Skilled Nursing Facility   Discharge Planning Services: CM Consult Post Acute Care Choice: Skilled Nursing Facility Living arrangements for the past 2 months: Single Family Home                                       Social Determinants of Health (SDOH) Interventions    Readmission Risk Interventions No flowsheet data found.

## 2020-08-02 ENCOUNTER — Encounter (HOSPITAL_COMMUNITY): Payer: Self-pay | Admitting: Physical Medicine and Rehabilitation

## 2020-08-02 ENCOUNTER — Inpatient Hospital Stay (HOSPITAL_COMMUNITY)
Admission: RE | Admit: 2020-08-02 | Discharge: 2020-08-06 | DRG: 945 | Disposition: A | Discharge status: Other Acute Inpatient Hospital | Payer: Medicare PPO | Source: Intra-hospital | Attending: Physical Medicine and Rehabilitation | Admitting: Physical Medicine and Rehabilitation

## 2020-08-02 DIAGNOSIS — I471 Supraventricular tachycardia, unspecified: Secondary | ICD-10-CM | POA: Diagnosis present

## 2020-08-02 DIAGNOSIS — R630 Anorexia: Secondary | ICD-10-CM | POA: Diagnosis present

## 2020-08-02 DIAGNOSIS — E538 Deficiency of other specified B group vitamins: Secondary | ICD-10-CM | POA: Diagnosis present

## 2020-08-02 DIAGNOSIS — N3941 Urge incontinence: Secondary | ICD-10-CM

## 2020-08-02 DIAGNOSIS — R5381 Other malaise: Secondary | ICD-10-CM | POA: Diagnosis present

## 2020-08-02 DIAGNOSIS — I10 Essential (primary) hypertension: Secondary | ICD-10-CM | POA: Diagnosis present

## 2020-08-02 DIAGNOSIS — G4700 Insomnia, unspecified: Secondary | ICD-10-CM | POA: Diagnosis present

## 2020-08-02 DIAGNOSIS — R2681 Unsteadiness on feet: Secondary | ICD-10-CM | POA: Diagnosis not present

## 2020-08-02 DIAGNOSIS — E871 Hypo-osmolality and hyponatremia: Secondary | ICD-10-CM | POA: Diagnosis present

## 2020-08-02 DIAGNOSIS — R339 Retention of urine, unspecified: Secondary | ICD-10-CM | POA: Diagnosis not present

## 2020-08-02 DIAGNOSIS — H409 Unspecified glaucoma: Secondary | ICD-10-CM | POA: Diagnosis present

## 2020-08-02 DIAGNOSIS — Z8616 Personal history of COVID-19: Secondary | ICD-10-CM

## 2020-08-02 DIAGNOSIS — R627 Adult failure to thrive: Secondary | ICD-10-CM | POA: Diagnosis present

## 2020-08-02 DIAGNOSIS — E785 Hyperlipidemia, unspecified: Secondary | ICD-10-CM | POA: Diagnosis present

## 2020-08-02 DIAGNOSIS — I959 Hypotension, unspecified: Secondary | ICD-10-CM | POA: Diagnosis not present

## 2020-08-02 DIAGNOSIS — F332 Major depressive disorder, recurrent severe without psychotic features: Secondary | ICD-10-CM | POA: Diagnosis present

## 2020-08-02 DIAGNOSIS — L8990 Pressure ulcer of unspecified site, unspecified stage: Secondary | ICD-10-CM | POA: Diagnosis present

## 2020-08-02 DIAGNOSIS — Z79899 Other long term (current) drug therapy: Secondary | ICD-10-CM

## 2020-08-02 DIAGNOSIS — H401190 Primary open-angle glaucoma, unspecified eye, stage unspecified: Secondary | ICD-10-CM | POA: Diagnosis present

## 2020-08-02 DIAGNOSIS — F322 Major depressive disorder, single episode, severe without psychotic features: Secondary | ICD-10-CM

## 2020-08-02 MED ORDER — ENSURE ENLIVE PO LIQD
237.0000 mL | Freq: Three times a day (TID) | ORAL | Status: DC
  Administered 2020-08-03 – 2020-08-05 (×8): 237 mL via ORAL

## 2020-08-02 MED ORDER — BISACODYL 10 MG RE SUPP
10.0000 mg | Freq: Every day | RECTAL | Status: DC | PRN
  Administered 2020-08-04: 10 mg via RECTAL
  Filled 2020-08-02: qty 1

## 2020-08-02 MED ORDER — BIMATOPROST 0.01 % OP SOLN: 1.0000 [drp] | Freq: Every day | OPHTHALMIC | Status: DC

## 2020-08-02 MED ORDER — HYDROXYZINE HCL 25 MG PO TABS: 25.0000 mg | ORAL_TABLET | Freq: Three times a day (TID) | ORAL | 0 refills | Status: DC | PRN

## 2020-08-02 MED ORDER — FLUOXETINE HCL 10 MG PO CAPS
10.0000 mg | ORAL_CAPSULE | Freq: Every day | ORAL | Status: DC
  Administered 2020-08-03 – 2020-08-05 (×3): 10 mg via ORAL
  Filled 2020-08-02 (×3): qty 1

## 2020-08-02 MED ORDER — VENLAFAXINE HCL ER 37.5 MG PO CP24
37.5000 mg | ORAL_CAPSULE | Freq: Every day | ORAL | Status: AC
Start: 2020-08-03 — End: 2020-08-03
  Administered 2020-08-03: 37.5 mg via ORAL
  Filled 2020-08-02: qty 1

## 2020-08-02 MED ORDER — FLUOXETINE HCL 10 MG PO CAPS: 10.0000 mg | ORAL_CAPSULE | Freq: Every day | ORAL | Status: DC

## 2020-08-02 MED ORDER — FLUOXETINE HCL 10 MG PO CAPS: 10.0000 mg | ORAL_CAPSULE | Freq: Every day | ORAL | 3 refills | Status: DC

## 2020-08-02 MED ORDER — VITAMIN D3 25 MCG (1000 UNIT) PO TABS
1000.0000 [IU] | ORAL_TABLET | Freq: Every day | ORAL | Status: DC
  Administered 2020-08-03 – 2020-08-05 (×3): 1000 [IU] via ORAL
  Filled 2020-08-02 (×7): qty 1

## 2020-08-02 MED ORDER — PROCHLORPERAZINE EDISYLATE 10 MG/2ML IJ SOLN: 5.0000 mg | Freq: Four times a day (QID) | INTRAMUSCULAR | Status: DC | PRN

## 2020-08-02 MED ORDER — LIDOCAINE HCL URETHRAL/MUCOSAL 2 % EX GEL: CUTANEOUS | Status: DC | PRN

## 2020-08-02 MED ORDER — VITAMIN D3 25 MCG (1000 UNIT) PO TABS
1000.0000 [IU] | ORAL_TABLET | Freq: Every day | ORAL | Status: DC
  Administered 2020-08-02: 1000 [IU] via ORAL
  Filled 2020-08-02: qty 1

## 2020-08-02 MED ORDER — ADULT MULTIVITAMIN W/MINERALS CH
1.0000 | ORAL_TABLET | Freq: Every day | ORAL | Status: DC
  Administered 2020-08-03 – 2020-08-05 (×3): 1 via ORAL
  Filled 2020-08-02 (×3): qty 1

## 2020-08-02 MED ORDER — HYDROXYZINE HCL 25 MG PO TABS: 25.0000 mg | ORAL_TABLET | Freq: Three times a day (TID) | ORAL | Status: DC | PRN

## 2020-08-02 MED ORDER — NAPHAZOLINE-GLYCERIN 0.012-0.25 % OP SOLN
2.0000 [drp] | Freq: Three times a day (TID) | OPHTHALMIC | Status: DC
  Administered 2020-08-03 – 2020-08-05 (×8): 2 [drp] via OPHTHALMIC
  Filled 2020-08-02: qty 15

## 2020-08-02 MED ORDER — ALUM & MAG HYDROXIDE-SIMETH 200-200-20 MG/5ML PO SUSP: 30.0000 mL | ORAL | Status: DC | PRN

## 2020-08-02 MED ORDER — MEGESTROL ACETATE 400 MG/10ML PO SUSP: 400.0000 mg | Freq: Every day | ORAL | 0 refills | Status: DC

## 2020-08-02 MED ORDER — NON FORMULARY: 1.0000 [drp] | Freq: Every day | Status: DC

## 2020-08-02 MED ORDER — MEGESTROL ACETATE 400 MG/10ML PO SUSP
400.0000 mg | Freq: Every day | ORAL | Status: DC
  Administered 2020-08-03 – 2020-08-05 (×3): 400 mg via ORAL
  Filled 2020-08-02 (×3): qty 10

## 2020-08-02 MED ORDER — ACETAMINOPHEN 325 MG PO TABS: 325.0000 mg | ORAL_TABLET | ORAL | Status: DC | PRN

## 2020-08-02 MED ORDER — TRAZODONE HCL 50 MG PO TABS: 25.0000 mg | ORAL_TABLET | Freq: Every day | ORAL | Status: DC

## 2020-08-02 MED ORDER — ADULT MULTIVITAMIN W/MINERALS CH: 1.0000 | ORAL_TABLET | Freq: Every day | ORAL | Status: DC

## 2020-08-02 MED ORDER — LATANOPROST 0.005 % OP SOLN
1.0000 [drp] | Freq: Every day | OPHTHALMIC | Status: DC
  Administered 2020-08-05: 1 [drp] via OPHTHALMIC
  Filled 2020-08-02: qty 2.5

## 2020-08-02 MED ORDER — GUAIFENESIN-DM 100-10 MG/5ML PO SYRP: 5.0000 mL | ORAL_SOLUTION | Freq: Four times a day (QID) | ORAL | Status: DC | PRN

## 2020-08-02 MED ORDER — PROCHLORPERAZINE 25 MG RE SUPP
12.5000 mg | Freq: Four times a day (QID) | RECTAL | Status: DC | PRN
Start: 2020-08-02 — End: 2020-08-06

## 2020-08-02 MED ORDER — FLEET ENEMA 7-19 GM/118ML RE ENEM: 1.0000 | ENEMA | Freq: Once | RECTAL | Status: DC | PRN

## 2020-08-02 MED ORDER — VENLAFAXINE HCL ER 37.5 MG PO CP24: 37.5000 mg | ORAL_CAPSULE | Freq: Every day | ORAL | Status: DC

## 2020-08-02 MED ORDER — NYSTATIN 100000 UNIT/GM EX POWD: 1.0000 "application " | Freq: Two times a day (BID) | CUTANEOUS | Status: DC | PRN

## 2020-08-02 MED ORDER — DIPHENHYDRAMINE HCL 12.5 MG/5ML PO ELIX: 12.5000 mg | ORAL_SOLUTION | Freq: Four times a day (QID) | ORAL | Status: DC | PRN

## 2020-08-02 MED ORDER — ENSURE ENLIVE PO LIQD: 237.0000 mL | Freq: Three times a day (TID) | ORAL | 12 refills | Status: DC

## 2020-08-02 MED ORDER — PROCHLORPERAZINE MALEATE 5 MG PO TABS: 5.0000 mg | ORAL_TABLET | Freq: Four times a day (QID) | ORAL | Status: DC | PRN

## 2020-08-02 MED ORDER — BRINZOLAMIDE 1 % OP SUSP
1.0000 [drp] | Freq: Two times a day (BID) | OPHTHALMIC | Status: DC
  Administered 2020-08-02 – 2020-08-05 (×7): 1 [drp] via OPHTHALMIC
  Filled 2020-08-02: qty 10

## 2020-08-02 MED ORDER — POLYETHYLENE GLYCOL 3350 17 G PO PACK
17.0000 g | PACK | Freq: Every day | ORAL | Status: DC | PRN
  Administered 2020-08-03 – 2020-08-04 (×2): 17 g via ORAL
  Filled 2020-08-02 (×2): qty 1

## 2020-08-02 MED ORDER — SIMVASTATIN 20 MG PO TABS
20.0000 mg | ORAL_TABLET | Freq: Every day | ORAL | Status: DC
  Administered 2020-08-03 – 2020-08-05 (×3): 20 mg via ORAL
  Filled 2020-08-02 (×3): qty 1

## 2020-08-02 MED ORDER — ENOXAPARIN SODIUM 40 MG/0.4ML IJ SOSY
40.0000 mg | PREFILLED_SYRINGE | INTRAMUSCULAR | Status: DC
  Administered 2020-08-03 – 2020-08-05 (×3): 40 mg via SUBCUTANEOUS
  Filled 2020-08-02 (×3): qty 0.4

## 2020-08-02 MED ORDER — TRAZODONE HCL 50 MG PO TABS
25.0000 mg | ORAL_TABLET | Freq: Every day | ORAL | Status: DC
  Administered 2020-08-02 – 2020-08-04 (×3): 25 mg via ORAL
  Filled 2020-08-02 (×3): qty 1

## 2020-08-02 NOTE — H&P (Signed)
Physical Medicine and Rehabilitation Admission H&P    Chief Complaint  Patient presents with   Failure To Thrive   Functional deficits   HPI:  Sheila Andrade is an 85 year old female with history of HTN, fall with pelvic Fx 01/2020 with progressive decline, recent Covid infection complicated by urinary retention/UTI, abdominal pain w/anorexia and wt loss (negative EGD 7/22), severe recurrent depression who was admitted on 07/14/20 with confusion, falls, weakness and inability to stand. She was briefly started on Rocephin due to + UA and culture w/Klebsiella Oxytoca/ornithia felt to be due to colonization therefore antibiotics d/c.   She also expressed severe depression with SI ideation and psychiatry consulted for input. She was also noted to have shuffling gait with tremors and freezing therefore neurology consulted for input on Parkinsonian features. Psychiatry and neurology has been following during hospitalization to help determine cause --Parkinsonism due to medication effect v/s severe depression v/s pseudodementia.  MRI C/T/L spine negative for compression or explanation of BLE weakness. Outpatient neurology work up with EMG/NCS and formal neurocognitive evaluation recommended by Dr. Selina Cooley. F Abilify was discontinued, Effexor slowly being tapered off, Trazodone added for sleep wake disuruption with atarax tid prn for anxiety--ativan caused excessive sedation, Megace for appetite stimulant as well as recommendations for outpatient psych referral for further medication management. Dr. Hazle Quant w/pschiatiry recommends lowering Prozac to 10 mg starting 07/30 "to see if it aided with memory issues" question causing memory issues.   Family has expressed concerns about her mood as well as intermittent reports of SI as well as poor po intake.  IVF were resumed  briefly per request. Suicide sitters d/c.  RD has been following input/asssitance with supplements.  Vitamin B12 deficiency boderline low--226 and  was supplemented. Her mood is stable and intake averaging 505.  She continues to be limited by weakness, fear of falling, balance deficits with posterior lean, delay in processing and decreased awareness of deficits. CIR recommended due to functional decline.    Review of Systems  Constitutional:  Negative for chills and fever.  HENT:  Negative for hearing loss.   Eyes:  Negative for blurred vision and double vision.  Respiratory:  Negative for cough, shortness of breath and stridor.   Cardiovascular:  Negative for chest pain, palpitations and leg swelling.  Gastrointestinal:  Negative for constipation (goes every 3 days), heartburn and nausea.  Genitourinary:  Negative for dysuria, frequency and urgency.  Musculoskeletal:  Negative for back pain, myalgias and neck pain.  Skin:  Negative for itching and rash.  Neurological:  Positive for weakness. Negative for dizziness, tremors, sensory change and headaches.  Psychiatric/Behavioral:  Positive for depression. The patient is nervous/anxious.     Past Medical History:  Diagnosis Date   Hot flashes 06/06/2020   Hypertension     History reviewed. No pertinent surgical history.   Family History  Problem Relation Age of Onset   Hypertension Other     Social History: Was living alone prior to Jan 2022. She has been taking turns living with daughters in [place] and [place]. Retired --has PHD in education. She  reports that she has never smoked. She has never used smokeless tobacco. She reports that she does not use drugs. No history on file for alcohol use.   Allergies: No Known Allergies   Medications Prior to Admission  Medication Sig Dispense Refill   acetaminophen (TYLENOL) 500 MG tablet Take 1,000 mg by mouth every 6 (six) hours as needed for mild pain or  headache.     bimatoprost (LUMIGAN) 0.01 % SOLN Place 1 drop into the left eye at bedtime.     brinzolamide (AZOPT) 1 % ophthalmic suspension Place 1 drop into the left eye 2 (two)  times daily.     cholecalciferol (VITAMIN D) 25 MCG (1000 UNIT) tablet Take 1,000 Units by mouth daily.     Multiple Vitamins-Minerals (MULTIVITAMIN WITH MINERALS) tablet Take 1 tablet by mouth daily.     omeprazole (PRILOSEC) 40 MG capsule Take 40 mg by mouth daily as needed (heartburn).     ondansetron (ZOFRAN-ODT) 4 MG disintegrating tablet Take 4 mg by mouth every 8 (eight) hours as needed for nausea/vomiting.     polyethylene glycol (MIRALAX / GLYCOLAX) 17 g packet Take 17 g by mouth daily as needed for mild constipation.     simvastatin (ZOCOR) 20 MG tablet Take 20 mg by mouth daily.     traZODone (DESYREL) 50 MG tablet Take 25 mg by mouth at bedtime as needed for sleep.     venlafaxine XR (EFFEXOR-XR) 37.5 MG 24 hr capsule Take 37.5 mg by mouth every morning.     [DISCONTINUED] nystatin (MYCOSTATIN/NYSTOP) powder Apply 1 application topically 3 (three) times daily. (Patient taking differently: Apply 1 application topically 2 (two) times daily as needed (rash under arm).) 15 g 0    Drug Regimen Review  Drug regimen was reviewed and remains appropriate with no significant issues identified  Home: Home Living Family/patient expects to be discharged to:: Skilled nursing facility Living Arrangements: Children Available Help at Discharge: Family, Friend(s), Available PRN/intermittently Type of Home: House Home Access: Stairs to enter Secretary/administrator of Steps: 3 Entrance Stairs-Rails: None Home Layout: Two level, Able to live on main level with bedroom/bathroom Bathroom Shower/Tub: Engineer, manufacturing systems: Handicapped height Bathroom Accessibility: Yes Additional Comments: has been living with her dtr since pelvic fx ~ 8-9 mos ago  Lives With: Daughter   Functional History: Prior Function Level of Independence: Needs assistance Gait / Transfers Assistance Needed: up until past weekend patient was ambulating with walker per daughter in law ADL's / Homemaking  Assistance Needed: per daughter in law patient working with [facility], was able to stand at sink to brush her teeth, performing toileting but daughter assisted with bathing and some dressing tasks.  Functional Status:  Mobility: Bed Mobility Overal bed mobility: Needs Assistance Bed Mobility: Sit to Supine Rolling: Max assist Sidelying to sit: Max assist Supine to sit: Min assist, HOB elevated Sit to supine: Min assist General bed mobility comments: Max cues to scoot to EOB. Min A hand held assist for raising trunk Transfers Overall transfer level: Needs assistance Equipment used: Rolling walker (2 wheeled) Transfer via Lift Equipment: Stedy Transfers: Sit to/from Stand, Pharmacologist Sit to Stand: Min assist, Mod assist Stand pivot transfers: Min assist, Mod assist General transfer comment: Pt wanting to practice multiple sit to stands and stand pivots.  Performed 4 stand pivots from bed to chair and back and 1 stand pivot from bed to bsc and back (10 total) and 18 stands total.  To stand: cues for hand placement, to lean forward, and to tuck buttock and pull back shoulder to complete stand.  Cues for RW and to keep buttock tucked for stand pivot.  To sit: cued to always turn all the way until both legs touch surface and reach back.  Pt initially mod A progressing to min A but with increased time to rise. Ambulation/Gait Ambulation/Gait assistance: Min assist, +2  safety/equipment Gait Distance (Feet): 22 Feet (22'x2) Assistive device: Rolling walker (2 wheeled) Gait Pattern/deviations: Step-to pattern, Decreased stance time - left, Decreased weight shift to left, Shuffle General Gait Details: Pt with shuffle almost festinating pattern.  Cued to slow and take bigger steps with assist to weight shift.  Had chair follow.  22'x2 Gait velocity: decr Gait velocity interpretation: <1.8 ft/sec, indicate of risk for recurrent falls    ADL: ADL Overall ADL's : Needs  assistance/impaired Grooming: Moderate assistance, Standing, Wash/dry face, Brushing hair Grooming Details (indicate cue type and reason): Mod A external assist for standing balance. Pt requiring verbal cues for upright posture initially. Upper Body Bathing: Supervision/ safety, Sitting Lower Body Bathing: Maximal assistance, Sitting/lateral leans, Sit to/from stand Upper Body Dressing : Supervision/safety, Sitting, Bed level Lower Body Dressing: Maximal assistance Lower Body Dressing Details (indicate cue type and reason): Max A for guiding toes into shoe and tying shoes. Toilet Transfer: Moderate assistance, RW Toilet Transfer Details (indicate cue type and reason): simulated toilet transfer this date, back and forth recliner to bed. Toileting- Clothing Manipulation and Hygiene: Total assistance, Sit to/from stand Toileting - Clothing Manipulation Details (indicate cue type and reason): Total A for pericare and clothing management Functional mobility during ADLs: Moderate assistance, +2 for physical assistance, +2 for safety/equipment (Heavy mod A +2 for stand pivot transfers; mod A for sit<>stand) General ADL Comments: spent time working on scooting to the edge, leaning forward and standing.  stood times 4 with RW with increased time for bringing hips forward and shifting weight forward.  Cognition: Cognition Overall Cognitive Status: Impaired/Different from baseline Orientation Level: Oriented X4 Cognition Arousal/Alertness: Awake/alert Behavior During Therapy: WFL for tasks assessed/performed Overall Cognitive Status: Impaired/Different from baseline Area of Impairment: Following commands, Memory, Attention, Safety/judgement, Awareness, Problem solving Orientation Level: Disoriented to, Situation Current Attention Level:  (Internally distracted by fear of falling) Memory: Decreased short-term memory Following Commands: Follows one step commands with increased time Safety/Judgement:  Decreased awareness of safety, Decreased awareness of deficits Awareness: Emergent Problem Solving: Slow processing General Comments: Needs repetition   Blood pressure 128/74, pulse 78, temperature 98.8 F (37.1 C), temperature source Oral, resp. rate 18, height 5\' 7"  (1.702 m), weight 51.3 kg, SpO2 96 %. Physical Exam Vitals and nursing note reviewed.  Constitutional:      Appearance: She is underweight. She is ill-appearing.  HENT:     Right Ear: External ear normal.     Left Ear: External ear normal.     Nose: Nose normal.  Eyes:     Extraocular Movements: Extraocular movements intact.     Pupils: Pupils are equal, round, and reactive to light.     Comments: Left eye lashes with crusted drainage. Right periorbital area with mild irritation.   Cardiovascular:     Rate and Rhythm: Normal rate and regular rhythm.     Heart sounds: No murmur heard.   No gallop.  Pulmonary:     Effort: Pulmonary effort is normal. No respiratory distress.     Breath sounds: No wheezing.  Abdominal:     General: Bowel sounds are normal. There is no distension.     Tenderness: There is no abdominal tenderness.  Musculoskeletal:     Cervical back: Normal range of motion and neck supple.     Comments: Significant bilateral pes cavus deformities  Skin:    General: Skin is warm and dry.  Neurological:     Mental Status: She is alert and oriented to person, place,  and time.     Sensory: No sensory deficit.     Comments: Alert and oriented x 3. Normal insight and awareness. Intact Memory. Normal language and speech. Cranial nerve exam unremarkable. UE tremor L>R. Strength 5/5 RUE and 4/5 LUE (shoulder limits). LE: 3-4/5 prox to distal.   Psychiatric:     Comments: Pt very flat but cooperative.     No results found for this or any previous visit (from the past 48 hour(s)). No results found.     Medical Problem List and Plan: 1.  Functional and mobility deficits secondary to FTT, severe  depression, medication side effects?  -patient may shower  -ELOS/Goals: 10-14 days, supervision to min assist with PT and OT 2.  Antithrombotics: -DVT/anticoagulation:  Pharmaceutical: Lovenox  -antiplatelet therapy: N/A 3. Pain Management: Tylenol prn.  4. Mood: LCSW to follow for evaluation and support.   -see #8  -antipsychotic agents: N/A 5. Neuropsych: This patient  may be intermittently capable of making decisions on her own behalf. 6. Skin/Wound Care: Routine pressure relief measures.  7. Fluids/Electrolytes/Nutrition: Monitor I/O. Check Lytes in am. 8. Severe recurrent depression: Effexor tapers off tomorrow.  --Continue Prozac (decreased to 10 mg starting 7/30) with trazodone for insomnia. 9. Anorexia: On megace for supplement with intake improving.  10. Glaucoma: Managed with lumigan and Azopt at nights.  11. Frequency/incontinence: New since fall per reports.   -may use purewick initially, but I told pt we're going to move away from it --Will monitor voiding with PVR checks.  --Toilet every 3-4 hours.  12. Vitamin B 12 deficiency: Will add oral supplement also.       Jacquelynn Creeamela S Love, PA-C 08/02/2020

## 2020-08-02 NOTE — TOC Transition Note (Signed)
Transition of Care [facility]) - CM/SW Discharge Note   Patient Details  Name: Sheila Andrade MRN: [id number] Date of Birth: August 30, 1935  [facility] CM/SW Contact:  Kermit Balo, RN Phone Number: 08/02/2020, 1:13 PM   Clinical Narrative:    Patient is discharging to CIR today. CM is signing off.   Final next level of care: IP Rehab Facility Barriers to Discharge: No Barriers Identified   Patient Goals and CMS Choice     Choice offered to / list presented to : Patient, Adult Children  Discharge Placement                       Discharge Plan and Services   Discharge Planning Services: CM Consult Post Acute Care Choice: Skilled Nursing Facility                               Social Determinants of Health (SDOH) Interventions     Readmission Risk Interventions No flowsheet data found.

## 2020-08-02 NOTE — Progress Notes (Signed)
Inpatient Rehabilitation Medication Review by a Pharmacist  A complete drug regimen review was completed for this patient to identify any potential clinically significant medication issues.  Clinically significant medication issues were identified:  no  Check AMION for pharmacist assigned to patient if future medication questions/issues arise during this admission.  Pharmacist comments:   Time spent performing this drug regimen review (minutes):  20   Thank you for allowing [place] to participate in this patients care. Signe Colt, PharmD 08/02/2020 9:40 PM

## 2020-08-02 NOTE — Progress Notes (Signed)
PROGRESS NOTE    Sheila Andrade  WPV:[id number] DOB: 1935-10-09 DOA: 07/14/2020 PCP: Dalphine Handing, MD   Chief Complain: Failure to thrive, fatigue  Brief Narrative:  Patient is a 85 year old female with history of depression, functional decline, recent history of pelvic fracture, tremor/bradykinesia was initially brought out in for suicidal ideation, decreased oral intake, generalized deconditioning.  She had a prolonged hospital stay.  Neurology and psychiatry were also following her during this hospitalization for her mental status changes, psychiatric history.  Her medications have been adjusted.  PT/OT have recommended CIR/skilled nursing facility on discharge.  She is waiting for bed.  She is medically stable for discharge whenever bed is available. Patient requested psychiatry consultation today saying she is more forgetful on current medications  Assessment & Plan:   Principal Problem:   MDD (major depressive disorder), recurrent episode, severe (HCC) Active Problems:   Weight loss   Anemia   UTI (urinary tract infection)   Hyponatremia   Suicidal ideations   Dehydration   Protein-calorie malnutrition, severe   Severe depression/suicidal ideation: Psychiatry has been following her.  Currently she is not actively suicidal or homicidal but still significantly anxious.  Psychiatry recommendation as below: Continue Effexor XR 37.5 mg qd until 08/04/20 then stop. -Continue Prozac 20 mg qd for depression -Continue Megace 400 mg daily for appetite stimulation -Continue Trazodone 25 mg at bedtime for sleep -Recommend Atarax/Vistaril 25 mg tid prn for anxiety -Recommend avoiding Ativan for patient as patient becomes too sedated with Ativan -Recommend outpatient psychiatry referral for med management upon discharge  She continues to require sitter at the bedside.  Failure to thrive/deconditioning/functional decline/decreased oral intake: Continue to encourage oral intake,  supplementation.  On Megace for appetite enhancement.  Bradykinesia/hyperreflexia on lower extremities/poor balance: MRI of the brain, C/L-spine studies were unremarkable.  Neurology was following.  Outpatient neurology work-up recommended.  PT/OT recommended CIR but had insurance denial so planning for skilled nursing facility on discharge. Difficulty in disposition due to patient's psychiatric history.  Vitamin D deficiency: Continue supplementation  Moderate to severe protein calorie moderation: Secondary to decreased oral intake.  Continue oral supplements.  Dietitian following     Nutrition Problem: Severe Malnutrition Etiology: chronic illness (MDD)      DVT prophylaxis:Lovenox Code Status: Full Family Communication: called both  daughterCorrie Dandy) and daughter-in-law ( [name])on phone at 10:47 am on 08/02/20, calls not received Status is: Inpatient  Remains inpatient appropriate because:Unsafe d/c plan  Dispo: The patient is from: Home              Anticipated d/c is to: SNF              Patient currently is medically stable to d/c.   Difficult to place patient Yes      Consultants: Psychiatry, neurology  Procedures: None  Antimicrobials:  Anti-infectives (From admission, onward)    Start     Dose/Rate Route Frequency Ordered Stop   07/15/20 2000  cefTRIAXone (ROCEPHIN) 1 g in sodium chloride 0.9 % 100 mL IVPB  Status:  Discontinued        1 g 200 mL/hr over 30 Minutes Intravenous Every 24 hours 07/15/20 0000 07/15/20 1635   07/14/20 2315  cefTRIAXone (ROCEPHIN) 1 g in sodium chloride 0.9 % 100 mL IVPB        1 g 200 mL/hr over 30 Minutes Intravenous  Once 07/14/20 2307 07/15/20 0040       Subjective:  Patient seen and examined the bedside this  morning.  Hemodynamically stable.  Same as yesterday.  Sitter at the bedside.  Lying on bed.  Complains of being forgetful on current medications.  She said she forgot her passcode, She was requesting psychiatry  consultation.   Objective: Vitals:   08/01/20 1527 08/01/20 2009 08/02/20 0029 08/02/20 0339  BP: 117/77 116/69 107/80 130/89  Pulse: 80 79 79 73  Resp: 14 18 15 15   Temp: 98.3 F (36.8 C) 98.2 F (36.8 C) 98.4 F (36.9 C) 98.4 F (36.9 C)  TempSrc: Oral Oral Oral Oral  SpO2: 98% 98% 98% 98%  Weight:      Height:        Intake/Output Summary (Last 24 hours) at 08/02/2020 0736 Last data filed at 08/01/2020 1355 Gross per 24 hour  Intake 560 ml  Output 650 ml  Net -90 ml   Filed Weights   07/14/20 1755  Weight: 51.3 kg    Examination:  General exam: Overall comfortable, not in distress HEENT: PERRL Respiratory system:  no wheezes or crackles  Cardiovascular system: S1 & S2 heard, RRR.  Gastrointestinal system: Abdomen is nondistended, soft and nontender. Central nervous system: Alert and oriented,flat affect Extremities: No edema, no clubbing ,no cyanosis Skin: No rashes, no ulcers,no icterus      Data Reviewed: I have personally reviewed following labs and imaging studies  CBC: Recent Labs  Lab 07/28/20 0153  WBC 7.1  HGB 10.6*  HCT 32.6*  MCV 88.8  PLT 341   Basic Metabolic Panel: Recent Labs  Lab 07/28/20 0153  NA 133*  K 4.5  CL 104  CO2 23  GLUCOSE 105*  BUN 24*  CREATININE 0.78  CALCIUM 9.1  MG 2.1   GFR: Estimated Creatinine Clearance: 43.9 mL/min (by C-G formula based on SCr of 0.78 mg/dL). Liver Function Tests: No results for input(s): AST, ALT, ALKPHOS, BILITOT, PROT, ALBUMIN in the last 168 hours.  No results for input(s): LIPASE, AMYLASE in the last 168 hours. No results for input(s): AMMONIA in the last 168 hours. Coagulation Profile: No results for input(s): INR, PROTIME in the last 168 hours. Cardiac Enzymes: No results for input(s): CKTOTAL, CKMB, CKMBINDEX, TROPONINI in the last 168 hours. BNP (last 3 results) No results for input(s): PROBNP in the last 8760 hours. HbA1C: No results for input(s): HGBA1C in the last 72  hours. CBG: No results for input(s): GLUCAP in the last 168 hours. Lipid Profile: No results for input(s): CHOL, HDL, LDLCALC, TRIG, CHOLHDL, LDLDIRECT in the last 72 hours. Thyroid Function Tests: No results for input(s): TSH, T4TOTAL, FREET4, T3FREE, THYROIDAB in the last 72 hours. Anemia Panel: No results for input(s): VITAMINB12, FOLATE, FERRITIN, TIBC, IRON, RETICCTPCT in the last 72 hours. Sepsis Labs: No results for input(s): PROCALCITON, LATICACIDVEN in the last 168 hours.  No results found for this or any previous visit (from the past 240 hour(s)).       Radiology Studies: No results found.      Scheduled Meds:  bimatoprost  1 drop Left Eye QHS   brinzolamide  1 drop Left Eye BID   enoxaparin (LOVENOX) injection  40 mg Subcutaneous Q24H   feeding supplement  237 mL Oral TID BM   FLUoxetine  20 mg Oral Daily   megestrol  400 mg Oral Daily   multivitamin with minerals  1 tablet Oral Daily   simvastatin  20 mg Oral Daily   traZODone  25 mg Oral QHS   venlafaxine XR  37.5 mg Oral Q breakfast  Continuous Infusions:   LOS: 17 days    Time spent: 25 mins.More than 50% of that time was spent in counseling and/or coordination of care.      Burnadette Pop, MD [facility] P7/29/2022, 7:36 AM

## 2020-08-02 NOTE — H&P (Addendum)
Physical Medicine and Rehabilitation Admission H&P        Chief Complaint  Patient presents with   Failure To Thrive   Functional deficits    HPI:  Sheila Andrade is an 85 year old female with history of HTN, fall with pelvic Fx 01/2020 with progressive decline, recent Covid infection complicated by urinary retention/UTI, abdominal pain w/anorexia and wt loss (negative EGD 7/22), severe recurrent depression who was admitted on 07/14/20 with confusion, falls, weakness and inability to stand. She was briefly started on Rocephin due to + UA and culture w/Klebsiella Oxytoca/ornithia felt to be due to colonization therefore antibiotics d/c.   She also expressed severe depression with SI ideation and psychiatry consulted for input. She was also noted to have shuffling gait with tremors and freezing therefore neurology consulted for input on Parkinsonian features. Psychiatry and neurology has been following during hospitalization to help determine cause --Parkinsonism due to medication effect v/s severe depression v/s pseudodementia.  MRI C/T/L spine negative for compression or explanation of BLE weakness. Outpatient neurology work up with EMG/NCS and formal neurocognitive evaluation recommended by Dr. Selina Cooley. F Abilify was discontinued, Effexor slowly being tapered off, Trazodone added for sleep wake disuruption with atarax tid prn for anxiety--ativan caused excessive sedation, Megace for appetite stimulant as well as recommendations for outpatient psych referral for further medication management. Dr. Hazle Quant w/pschiatiry recommends lowering Prozac to 10 mg starting 07/30 "to see if it aided with memory issues" question causing memory issues.   Family has expressed concerns about her mood as well as intermittent reports of SI as well as poor po intake.  IVF were resumed  briefly per request. Suicide sitters d/c.  RD has been following input/asssitance with supplements.  Vitamin B12 deficiency boderline  low--226 and was supplemented. Her mood is stable and intake averaging 505.  She continues to be limited by weakness, fear of falling, balance deficits with posterior lean, delay in processing and decreased awareness of deficits. CIR recommended due to functional decline.      Review of Systems Constitutional:  Negative for chills and fever. HENT:  Negative for hearing loss.   Eyes:  Negative for blurred vision and double vision. Respiratory:  Negative for cough, shortness of breath and stridor.   Cardiovascular:  Negative for chest pain, palpitations and leg swelling. Gastrointestinal:  Negative for constipation (goes every 3 days), heartburn and nausea. Genitourinary:  Negative for dysuria, frequency and urgency. Musculoskeletal:  Negative for back pain, myalgias and neck pain. Skin:  Negative for itching and rash. Neurological:  Positive for weakness. Negative for dizziness, tremors, sensory change and headaches. Psychiatric/Behavioral:  Positive for depression. The patient is nervous/anxious.           Past Medical History:  Diagnosis Date   Hot flashes 06/06/2020   Hypertension        History reviewed. No pertinent surgical history.          Family History  Problem Relation Age of Onset   Hypertension Other        Social History: Was living alone prior to Jan 2022. She has been taking turns living with daughters in [place] and [place]. Retired --has PHD in education. She  reports that she has never smoked. She has never used smokeless tobacco. She reports that she does not use drugs. No history on file for alcohol use.     Allergies: No Known Allergies           Medications Prior  to Admission  Medication Sig Dispense Refill   acetaminophen (TYLENOL) 500 MG tablet Take 1,000 mg by mouth every 6 (six) hours as needed for mild pain or headache.       bimatoprost (LUMIGAN) 0.01 % SOLN Place 1 drop into the left eye at bedtime.       brinzolamide (AZOPT) 1 % ophthalmic  suspension Place 1 drop into the left eye 2 (two) times daily.       cholecalciferol (VITAMIN D) 25 MCG (1000 UNIT) tablet Take 1,000 Units by mouth daily.       Multiple Vitamins-Minerals (MULTIVITAMIN WITH MINERALS) tablet Take 1 tablet by mouth daily.       omeprazole (PRILOSEC) 40 MG capsule Take 40 mg by mouth daily as needed (heartburn).       ondansetron (ZOFRAN-ODT) 4 MG disintegrating tablet Take 4 mg by mouth every 8 (eight) hours as needed for nausea/vomiting.       polyethylene glycol (MIRALAX / GLYCOLAX) 17 g packet Take 17 g by mouth daily as needed for mild constipation.       simvastatin (ZOCOR) 20 MG tablet Take 20 mg by mouth daily.       traZODone (DESYREL) 50 MG tablet Take 25 mg by mouth at bedtime as needed for sleep.       venlafaxine XR (EFFEXOR-XR) 37.5 MG 24 hr capsule Take 37.5 mg by mouth every morning.       [DISCONTINUED] nystatin (MYCOSTATIN/NYSTOP) powder Apply 1 application topically 3 (three) times daily. (Patient taking differently: Apply 1 application topically 2 (two) times daily as needed (rash under arm).) 15 g 0      Drug Regimen Review  Drug regimen was reviewed and remains appropriate with no significant issues identified   Home: Home Living Family/patient expects to be discharged to:: Skilled nursing facility Living Arrangements: Children Available Help at Discharge: Family, Friend(s), Available PRN/intermittently Type of Home: House Home Access: Stairs to enter Secretary/administrator of Steps: 3 Entrance Stairs-Rails: None Home Layout: Two level, Able to live on main level with bedroom/bathroom Bathroom Shower/Tub: Engineer, manufacturing systems: Handicapped height Bathroom Accessibility: Yes Additional Comments: has been living with her dtr since pelvic fx ~ 8-9 mos ago  Lives With: Daughter   Functional History: Prior Function Level of Independence: Needs assistance Gait / Transfers Assistance Needed: up until past weekend patient was  ambulating with walker per daughter in law ADL's / Homemaking Assistance Needed: per daughter in law patient working with [facility], was able to stand at sink to brush her teeth, performing toileting but daughter assisted with bathing and some dressing tasks.   Functional Status:  Mobility: Bed Mobility Overal bed mobility: Needs Assistance Bed Mobility: Sit to Supine Rolling: Max assist Sidelying to sit: Max assist Supine to sit: Min assist, HOB elevated Sit to supine: Min assist General bed mobility comments: Max cues to scoot to EOB. Min A hand held assist for raising trunk Transfers Overall transfer level: Needs assistance Equipment used: Rolling walker (2 wheeled) Transfer via Lift Equipment: Stedy Transfers: Sit to/from Stand, Pharmacologist Sit to Stand: Min assist, Mod assist Stand pivot transfers: Min assist, Mod assist General transfer comment: Pt wanting to practice multiple sit to stands and stand pivots.  Performed 4 stand pivots from bed to chair and back and 1 stand pivot from bed to bsc and back (10 total) and 18 stands total.  To stand: cues for hand placement, to lean forward, and to tuck buttock and pull back  shoulder to complete stand.  Cues for RW and to keep buttock tucked for stand pivot.  To sit: cued to always turn all the way until both legs touch surface and reach back.  Pt initially mod A progressing to min A but with increased time to rise. Ambulation/Gait Ambulation/Gait assistance: Min assist, +2 safety/equipment Gait Distance (Feet): 22 Feet (22'x2) Assistive device: Rolling walker (2 wheeled) Gait Pattern/deviations: Step-to pattern, Decreased stance time - left, Decreased weight shift to left, Shuffle General Gait Details: Pt with shuffle almost festinating pattern.  Cued to slow and take bigger steps with assist to weight shift.  Had chair follow.  22'x2 Gait velocity: decr Gait velocity interpretation: <1.8 ft/sec, indicate of risk for recurrent  falls   ADL: ADL Overall ADL's : Needs assistance/impaired Grooming: Moderate assistance, Standing, Wash/dry face, Brushing hair Grooming Details (indicate cue type and reason): Mod A external assist for standing balance. Pt requiring verbal cues for upright posture initially. Upper Body Bathing: Supervision/ safety, Sitting Lower Body Bathing: Maximal assistance, Sitting/lateral leans, Sit to/from stand Upper Body Dressing : Supervision/safety, Sitting, Bed level Lower Body Dressing: Maximal assistance Lower Body Dressing Details (indicate cue type and reason): Max A for guiding toes into shoe and tying shoes. Toilet Transfer: Moderate assistance, RW Toilet Transfer Details (indicate cue type and reason): simulated toilet transfer this date, back and forth recliner to bed. Toileting- Clothing Manipulation and Hygiene: Total assistance, Sit to/from stand Toileting - Clothing Manipulation Details (indicate cue type and reason): Total A for pericare and clothing management Functional mobility during ADLs: Moderate assistance, +2 for physical assistance, +2 for safety/equipment (Heavy mod A +2 for stand pivot transfers; mod A for sit<>stand) General ADL Comments: spent time working on scooting to the edge, leaning forward and standing.  stood times 4 with RW with increased time for bringing hips forward and shifting weight forward.   Cognition: Cognition Overall Cognitive Status: Impaired/Different from baseline Orientation Level: Oriented X4 Cognition Arousal/Alertness: Awake/alert Behavior During Therapy: WFL for tasks assessed/performed Overall Cognitive Status: Impaired/Different from baseline Area of Impairment: Following commands, Memory, Attention, Safety/judgement, Awareness, Problem solving Orientation Level: Disoriented to, Situation Current Attention Level:  (Internally distracted by fear of falling) Memory: Decreased short-term memory Following Commands: Follows one step  commands with increased time Safety/Judgement: Decreased awareness of safety, Decreased awareness of deficits Awareness: Emergent Problem Solving: Slow processing General Comments: Needs repetition     Blood pressure 128/74, pulse 78, temperature 98.8 F (37.1 C), temperature source Oral, resp. rate 18, height  (1.702 m), weight 51.3 kg, SpO2 96 %. Physical Exam Vitals and nursing note reviewed. Constitutional:      Appearance: She is underweight. She is ill-appearing. HENT:    Right Ear: External ear normal.    Left Ear: External ear normal.    Nose: Nose normal. Eyes:    Extraocular Movements: Extraocular movements intact.    Pupils: Pupils are equal, round, and reactive to light.    Comments: Left eye lashes with crusted drainage. Right periorbital area with mild irritation.   Cardiovascular:    Rate and Rhythm: Normal rate and regular rhythm.    Heart sounds: No murmur heard.   No gallop. Pulmonary:    Effort: Pulmonary effort is normal. No respiratory distress.    Breath sounds: No wheezing. Abdominal:    General: Bowel sounds are normal. There is no distension.    Tenderness: There is no abdominal tenderness. Musculoskeletal:    Cervical back: Normal range of motion and neck  supple.    Comments: Significant bilateral pes cavus deformities    Left shoulder sl limited with ABD/ER/IR d/t prior RTC injury Skin:    General: Skin is warm and dry. Scattered ecchymoses on arms. Neurological:    Mental Status: She is alert and oriented to person, place, and time.    Sensory: No sensory deficit.    Comments: Alert and oriented x 3. Normal insight and awareness. Intact Memory. Normal language and speech. Cranial nerve exam unremarkable. UE tremor L>R. Strength 5/5 RUE and 4/5 LUE (shoulder limits). LE: 3-4/5 prox to distal.   Psychiatric:    Comments: Pt very flat but cooperative.       Lab Results Last 48 Hours  No results found for this or any previous visit (from the  past 48 hour(s)).   Imaging Results (Last 48 hours)  No results found.           Medical Problem List and Plan: 1.  Functional and mobility deficits secondary to FTT, severe depression, medication side effects?             -patient may shower             -ELOS/Goals: 10-14 days, supervision to min assist with PT and OT 2.  Antithrombotics: -DVT/anticoagulation:  Pharmaceutical: Lovenox             -antiplatelet therapy: N/A 3. Pain Management: Tylenol prn.  4. Mood: LCSW to follow for evaluation and support.              -see #8             -antipsychotic agents: N/A 5. Neuropsych: This patient  may be intermittently capable of making decisions on her own behalf. 6. Skin/Wound Care: Routine pressure relief measures.  7. Fluids/Electrolytes/Nutrition: Monitor I/O. Check Lytes in am. 8. Severe recurrent depression: Effexor tapers off tomorrow. --Continue Prozac (decreased to 10 mg starting 7/30) with trazodone for insomnia. -monitor sleep pattern, interactions with staff/family. Seems to be fairly up beat at present and wants to get stronger/home 9. Anorexia: On megace for supplement with intake improving. 10. Glaucoma: Managed with lumigan and Azopt at nights. 11. Frequency/incontinence: New since fall per reports.              -may use purewick initially, but I told pt we're going to move away from it --Will monitor voiding with PVR checks. --Toilet every 3-4 hours. 12. Vitamin B 12 deficiency: Will add oral supplement also.          Jacquelynn Cree, PA-C 08/02/2020   I have personally performed a face to face diagnostic evaluation of this patient and formulated the key components of the plan.  Additionally, I have personally reviewed laboratory data, imaging studies, as well as relevant notes and concur with the physician assistant's documentation above.  The patient's status has not changed from the original H&P.  Any changes in documentation from the acute care chart have  been noted above.  Ranelle Oyster, MD, [place] [facility]

## 2020-08-02 NOTE — Consult Note (Signed)
[facility] Face-to-Face Psychiatry Consult   Reason for Consult:  "severe depression/intermittent suicidal ideation" Referring Physician:  Glade Lloyd Patient Identification: Sheila Andrade MRN:  [id number] Principal Diagnosis: MDD (major depressive disorder), recurrent episode, severe (HCC) Diagnosis:  Principal Problem:   MDD (major depressive disorder), recurrent episode, severe (HCC) Active Problems:   Weight loss   Anemia   UTI (urinary tract infection)   Hyponatremia   Suicidal ideations   Dehydration   Protein-calorie malnutrition, severe   Total Time spent with patient: 20 minutes  Subjective:   Sheila Andrade is a 85 y.o. female patient admitted due to confusion and inability to stand as well as weakness and loss of balance.  HPI:  Pt is a 85 yo female w/ depression secondary to medical problems. Reconsulted as patient stated she was having memory problems. Pt states she is having problems remembering phone numbers and specific directions. Pt concerned forgetfulness related to psychiatric medication.  Pt denies SI/HI/AVH.   Past Psychiatric History: MDD  Risk to Self:   No Risk to Others:   No Prior Inpatient Therapy:   None Prior Outpatient Therapy:   yes  Past Medical History:  Past Medical History:  Diagnosis Date   Hot flashes 06/06/2020   Hypertension    History reviewed. No pertinent surgical history. Family History:  Family History  Problem Relation Age of Onset   Hypertension Other    Family Psychiatric  History: n/a Social History:  Social History   Substance and Sexual Activity  Alcohol Use None     Social History   Substance and Sexual Activity  Drug Use Never    Social History   Socioeconomic History   Marital status: Widowed    Spouse name: Not on file   Number of children: Not on file   Years of education: Not on file   Highest education level: Not on file  Occupational History   Not on file  Tobacco Use   Smoking status: Never    Smokeless tobacco: Never  Substance and Sexual Activity   Alcohol use: Not on file   Drug use: Never   Sexual activity: Not on file  Other Topics Concern   Not on file  Social History Narrative   Not on file   Social Determinants of Health   Financial Resource Strain: Not on file  Food Insecurity: Not on file  Transportation Needs: Not on file  Physical Activity: Not on file  Stress: Not on file  Social Connections: Not on file   Additional Social History:    Allergies:  No Known Allergies  Labs:  No results found for this or any previous visit (from the past 48 hour(s)).   Current Facility-Administered Medications  Medication Dose Route Frequency Provider Last Rate Last Admin   acetaminophen (TYLENOL) tablet 650 mg  650 mg Oral Q6H PRN Therisa Doyne, MD   650 mg at 07/30/20 0908   Or   acetaminophen (TYLENOL) suppository 650 mg  650 mg Rectal Q6H PRN Doutova, Anastassia, MD       bimatoprost (LUMIGAN) 0.01 % ophthalmic solution 1 drop  1 drop Left Eye QHS Adhikari, Amrit, MD       brinzolamide (AZOPT) 1 % ophthalmic suspension 1 drop  1 drop Left Eye BID Therisa Doyne, MD   1 drop at 08/02/20 0945   cholecalciferol (VITAMIN D) tablet 1,000 Units  1,000 Units Oral Daily Burnadette Pop, MD   1,000 Units at 08/02/20 1202   enoxaparin (LOVENOX) injection 40 mg  40 mg Subcutaneous Q24H Alekh, Kshitiz, MD   40 mg at 08/01/20 1223   feeding supplement (ENSURE ENLIVE / ENSURE PLUS) liquid 237 mL  237 mL Oral TID BM Leroy Sea, MD   237 mL at 08/02/20 0955   [START ON 08/03/2020] FLUoxetine (PROZAC) capsule 10 mg  10 mg Oral Daily Park Pope, MD       hydrOXYzine (ATARAX/VISTARIL) tablet 25 mg  25 mg Oral TID PRN Bobbye Morton, MD       megestrol (MEGACE) 400 MG/10ML suspension 400 mg  400 mg Oral Daily Glade Lloyd, MD   400 mg at 08/02/20 0944   multivitamin with minerals tablet 1 tablet  1 tablet Oral Daily Glade Lloyd, MD   1 tablet at 08/02/20 0944    polyethylene glycol (MIRALAX / GLYCOLAX) packet 17 g  17 g Oral Daily PRN Calvert Cantor, MD   17 g at 07/29/20 1232   simvastatin (ZOCOR) tablet 20 mg  20 mg Oral Daily Doutova, Anastassia, MD   20 mg at 08/02/20 0944   traZODone (DESYREL) tablet 25 mg  25 mg Oral QHS Doutova, Anastassia, MD   25 mg at 08/01/20 2154   venlafaxine XR (EFFEXOR-XR) 24 hr capsule 37.5 mg  37.5 mg Oral Q breakfast Akintayo, Mojeed, MD   37.5 mg at 08/02/20 0944     Psychiatric Specialty Exam:  Presentation  General Appearance: Appropriate for Environment; Casual  Eye Contact:Minimal  Speech:Slow; Clear and Coherent  Speech Volume:Decreased   Mood and Affect  Mood:Depressed; Dysphoric  Affect:Depressed; Flat   Thought Process  Thought Processes:Coherent; Linear; Goal Directed  Descriptions of Associations:Intact  Orientation:Full (Time, Place and Person)  Thought Content:Logical  History of Schizophrenia/Schizoaffective disorder:No data recorded Duration of Psychotic Symptoms:No data recorded Hallucinations:No data recorded Ideas of Reference:None  Suicidal Thoughts:No data recorded Homicidal Thoughts:No data recorded  Sensorium  Memory:Recent Good; Immediate Good; Remote Good  Judgment:Fair  Insight:Fair   Executive Functions  Concentration:Fair  Attention Span:Good  Recall:Good  Fund of Knowledge:Good  Language:Good   Psychomotor Activity  Psychomotor Activity: No data recorded  Assets  Assets:Desire for Improvement; Manufacturing systems engineer; Housing; Social Support; Resilience; Physical Health; Leisure Time; Vocational/Educational; Talents/Skills; Transportation; Financial Resources/Insurance   Sleep  Sleep: No data recorded  Physical Exam: Physical Exam Vitals reviewed.  Constitutional:      Appearance: Normal appearance.  HENT:     Head: Normocephalic and atraumatic.  Eyes:     Extraocular Movements: Extraocular movements intact.  Cardiovascular:     Rate  and Rhythm: Normal rate and regular rhythm.  Pulmonary:     Effort: Pulmonary effort is normal.     Breath sounds: Normal breath sounds.  Abdominal:     General: Abdomen is flat.     Palpations: Abdomen is soft.  Musculoskeletal:     Cervical back: Normal range of motion.  Skin:    General: Skin is warm and dry.  Neurological:     General: No focal deficit present.     Mental Status: She is alert and oriented to person, place, and time.   Review of Systems  Constitutional:  Negative for chills and fever.  Respiratory:  Negative for cough, shortness of breath and wheezing.   Cardiovascular:  Negative for chest pain and palpitations.  Gastrointestinal:  Negative for abdominal pain, nausea and vomiting.  Skin:  Negative for itching and rash.  Neurological:  Negative for dizziness and headaches.  Blood pressure 128/74, pulse 78, temperature 98.8 F (37.1 C),  temperature source Oral, resp. rate 18, height 5\' 7"  (1.702 m), weight 51.3 kg, SpO2 96 %. Body mass index is 17.7 kg/m.  Treatment Plan Summary: Pt is a 85 yo female w/ depression secondary to medical problems. Discussed lowering dose to 10 mg prozac to see if this aided with memory problems..   Depression related to medical problems vs MDD -Continue Effexor XR 37.5 mg qd until 08/04/20 then stop. -Lower prozac from 20 mg qd to 10 mg qd for depression -Continue Megace 400 mg daily for appetite stimulation -Continue Trazodone 25 mg at bedtime for sleep -Recommend outpatient psychiatry referral for med management upon discharge -Recommend Atarax/Vistaril 25 mg tid prn for anxiety -Recommend avoiding Ativan for patient as patient becomes too sedated with Ativan -signing off  Disposition: No evidence of imminent risk to self or others at present.   Patient does not meet criteria for psychiatric inpatient admission.  Thank you for the consult 08/06/20, MD PGY1 Psychiatry Resident 08/02/2020 2:01 PM

## 2020-08-02 NOTE — Progress Notes (Signed)
Physical Therapy Treatment Patient Details Name: Sheila Andrade MRN: [id number] DOB: 1935-06-16 Today's Date: 08/02/2020    History of Present Illness Pt is 85 y.o. female adm with recurrent UTI, SI, and FTT on 07/14/20 PMH:  recent COVID, glaucoma osteoporosis, HLD, depression . Pysch consulted    PT Comments    Patient requiring maxA to complete sit to stand x 5 with heavy posterior lean and bracing bilateral LE on bed for stability. Multimodal cueing and facilitation at chest and buttocks for upright posture with poor follow through. Patient fearful of falling and requires encouragement to perform. Patient continues to be limited by weakness, impaired standing balance, decreased activity tolerance, impaired cognition, and impaired functional mobility. Updated d/c recommendation to CIR for intensive therapies to assist with return to PLOF.    Follow Up Recommendations  CIR     Equipment Recommendations  Other (comment) (defer to post acute rehab)    Recommendations for Other Services       Precautions / Restrictions Precautions Precautions: Fall Precaution Comments: heavy posterior lean Restrictions Weight Bearing Restrictions: No    Mobility  Bed Mobility Overal bed mobility: Needs Assistance Bed Mobility: Supine to Sit;Sit to Supine     Supine to sit: Min assist;HOB elevated Sit to supine: Min assist   General bed mobility comments: minA for trunk elevation and repositioning hips towards EOB. MinA to reposition trunk once returned to supine    Transfers Overall transfer level: Needs assistance Equipment used: Rolling Tayloranne Lekas (2 wheeled) Transfers: Sit to/from Stand Sit to Stand: Max assist         General transfer comment: Sit to stand x 5. Required maxA to rise and maintain standing due to heavy posterior lean and bracing B LEs against bed. Tactile facilitation at chest and buttocks for upright posture. Increased time and effort to complete each  rep  Ambulation/Gait             General Gait Details: Attempt to take steps but unable to advance feet   Stairs             Wheelchair Mobility    Modified Rankin (Stroke Patients Only)       Balance Overall balance assessment: Needs assistance Sitting-balance support: Feet supported;No upper extremity supported Sitting balance-Leahy Scale: Good     Standing balance support: Bilateral upper extremity supported;During functional activity Standing balance-Leahy Scale: Zero Standing balance comment: maxA to maintain standing with Rw                            Cognition Arousal/Alertness: Awake/alert Behavior During Therapy: WFL for tasks assessed/performed Overall Cognitive Status: Impaired/Different from baseline Area of Impairment: Following commands;Memory;Attention;Safety/judgement;Awareness;Problem solving                   Current Attention Level: Sustained Memory: Decreased short-term memory Following Commands: Follows one step commands with increased time Safety/Judgement: Decreased awareness of safety;Decreased awareness of deficits Awareness: Emergent Problem Solving: Slow processing        Exercises      General Comments        Pertinent Vitals/Pain Pain Assessment: No/denies pain    Home Living                      Prior Function            PT Goals (current goals can now be found in the care plan section) Acute Rehab PT Goals  Patient Stated Goal: to get better PT Goal Formulation: With patient Time For Goal Achievement: 08/12/20 Potential to Achieve Goals: Good Progress towards PT goals: Progressing toward goals    Frequency    Min 3X/week      PT Plan Current plan remains appropriate    Co-evaluation              AM-PAC PT "6 Clicks" Mobility   Outcome Measure  Help needed turning from your back to your side while in a flat bed without using bedrails?: A Little Help needed moving  from lying on your back to sitting on the side of a flat bed without using bedrails?: A Little Help needed moving to and from a bed to a chair (including a wheelchair)?: A Lot Help needed standing up from a chair using your arms (e.g., wheelchair or bedside chair)?: A Lot Help needed to walk in hospital room?: Total Help needed climbing 3-5 steps with a railing? : Total 6 Click Score: 12    End of Session Equipment Utilized During Treatment: Gait belt Activity Tolerance: Patient tolerated treatment well Patient left: in bed;with call bell/phone within reach;with bed alarm set Nurse Communication: Mobility status PT Visit Diagnosis: Other abnormalities of gait and mobility (R26.89);Difficulty in walking, not elsewhere classified (R26.2);Unsteadiness on feet (R26.81);Muscle weakness (generalized) (M62.81)     Time: 3536-1443 PT Time Calculation (min) (ACUTE ONLY): 26 min  Charges:  $Therapeutic Exercise: 23-37 mins                     Om Lizotte A. Dan Humphreys PT, DPT Acute Rehabilitation Services Pager [phone] Office [phone]    Viviann Spare 08/02/2020, 4:02 PM

## 2020-08-02 NOTE — Discharge Summary (Signed)
Physician Discharge Summary  Sheila Andrade NLG:[id number] DOB: 1935/07/21 DOA: 07/14/2020  PCP: Dalphine Handing, MD  Admit date: 07/14/2020 Discharge date: 08/02/2020  Admitted From: Home Disposition: CIR  Discharge Condition:Stable CODE STATUS:FULL Diet recommendation:  Regular   Brief/Interim Summary:  Patient is a 85 year old female with history of depression, functional decline, recent history of pelvic fracture, tremor/bradykinesia was initially brought out in for suicidal ideation, decreased oral intake, generalized deconditioning.  She had a prolonged hospital stay.  Neurology and psychiatry were also following her during this hospitalization for her mental status changes, psychiatric history.  Her medications have been adjusted.  PT/OT have recommended CIR.  She is medically stable for discharge .  Following problems were addressed during her hospitalization:  Severe depression/suicidal ideation: Psychiatry has been following her.  Currently she is not actively suicidal or homicidal but still significantly anxious.  Psychiatry recommendation as below: Continue Effexor XR 37.5 mg qd until 08/04/20 then stop. -Continue Prozac 10 mg qd for depression -Continue Megace 400 mg daily for appetite stimulation -Continue Trazodone 25 mg at bedtime for sleep -Recommend Atarax/Vistaril 25 mg tid prn for anxiety -Recommend avoiding Ativan for patient as patient becomes too sedated with Ativan -Recommend outpatient psychiatry referral for med management upon discharge    Failure to thrive/deconditioning/functional decline/decreased oral intake: Continue to encourage oral intake, supplementation.  On Megace for appetite enhancement.   Bradykinesia/hyperreflexia on lower extremities/poor balance: MRI of the brain, C/L-spine studies were unremarkable.  Neurology was following.  Outpatient neurology work-up recommended.  PT/OT recommended CIR but had insurance denial so planning for skilled nursing  facility on discharge. Difficulty in disposition due to patient's psychiatric history.   Vitamin D deficiency: Continue supplementation   Moderate to severe protein calorie moderation: Secondary to decreased oral intake.  Continue oral supplements.  Dietitian was following      Discharge Diagnoses:  Principal Problem:   MDD (major depressive disorder), recurrent episode, severe (HCC) Active Problems:   Weight loss   Anemia   UTI (urinary tract infection)   Hyponatremia   Suicidal ideations   Dehydration   Protein-calorie malnutrition, severe    Discharge Instructions  Discharge Instructions     Ambulatory referral to Neurology   Complete by: As directed    An appointment is requested in approximately: 2-6 weeks -- movement disorders   Ambulatory referral to Neuropsychology   Complete by: As directed    Diet - low sodium heart healthy   Complete by: As directed    Discharge instructions   Complete by: As directed    1)Please take prescribed medications as instructed 2)Follow up with neurology and psychiatry as an outpatient.   Increase activity slowly   Complete by: As directed       Allergies as of 08/02/2020   No Known Allergies      Medication List     TAKE these medications    acetaminophen 500 MG tablet Commonly known as: TYLENOL Take 1,000 mg by mouth every 6 (six) hours as needed for mild pain or headache.   bimatoprost 0.01 % Soln Commonly known as: LUMIGAN Place 1 drop into the left eye at bedtime.   brinzolamide 1 % ophthalmic suspension Commonly known as: AZOPT Place 1 drop into the left eye 2 (two) times daily.   cholecalciferol 25 MCG (1000 UNIT) tablet Commonly known as: VITAMIN D Take 1,000 Units by mouth daily.   feeding supplement Liqd Take 237 mLs by mouth 3 (three) times daily between meals.  FLUoxetine 10 MG capsule Commonly known as: PROZAC Take 1 capsule (10 mg total) by mouth daily. Start taking on: August 03, 2020    hydrOXYzine 25 MG tablet Commonly known as: ATARAX/VISTARIL Take 1 tablet (25 mg total) by mouth 3 (three) times daily as needed for anxiety.   megestrol 400 MG/10ML suspension Commonly known as: MEGACE Take 10 mLs (400 mg total) by mouth daily. Start taking on: August 03, 2020   multivitamin with minerals tablet Take 1 tablet by mouth daily.   multivitamin with minerals Tabs tablet Take 1 tablet by mouth daily. Start taking on: August 03, 2020   nystatin powder Commonly known as: MYCOSTATIN/NYSTOP Apply 1 application topically 2 (two) times daily as needed (rash under arm).   omeprazole 40 MG capsule Commonly known as: PRILOSEC Take 40 mg by mouth daily as needed (heartburn).   ondansetron 4 MG disintegrating tablet Commonly known as: ZOFRAN-ODT Take 4 mg by mouth every 8 (eight) hours as needed for nausea/vomiting.   polyethylene glycol 17 g packet Commonly known as: MIRALAX / GLYCOLAX Take 17 g by mouth daily as needed for mild constipation.   simvastatin 20 MG tablet Commonly known as: ZOCOR Take 20 mg by mouth daily.   traZODone 50 MG tablet Commonly known as: DESYREL Take 0.5 tablets (25 mg total) by mouth at bedtime. What changed:  when to take this reasons to take this   venlafaxine XR 37.5 MG 24 hr capsule Commonly known as: EFFEXOR-XR Take 1 capsule (37.5 mg total) by mouth daily with breakfast. Last dose on 08/04/20 Start taking on: August 03, 2020 What changed:  when to take this additional instructions        No Known Allergies  Consultations: Psychiatry,neurology   Procedures/Studies: DG Chest 2 View  Result Date: 07/14/2020 CLINICAL DATA:  Failure to thrive, weight loss EXAM: CHEST - 2 VIEW COMPARISON:  Radiograph 05/31/2020 FINDINGS: Suspect a small calcified mediastinal lymph node seen on lateral radiograph. Remaining cardiomediastinal contours are unremarkable. Bandlike opacity in the right lung base, likely scarring given similar to prior.  No consolidation, features of edema, pneumothorax, or effusion. Degenerative changes are present in the imaged spine and shoulders. Evidence of calcific tendinosis in the left shoulder. Left cervical carotid atherosclerosis. IMPRESSION: Suspect a small calcified mediastinal/hilar node. Can reflect sequela of prior granulomatous disease. No acute cardiopulmonary abnormality. Electronically Signed   By: Kreg Shropshire M.D.   On: 07/14/2020 20:56   MR BRAIN WO CONTRAST  Result Date: 07/14/2020 CLINICAL DATA:  Mental status change. EXAM: MRI HEAD WITHOUT CONTRAST TECHNIQUE: Multiplanar, multiecho pulse sequences of the brain and surrounding structures were obtained without intravenous contrast. COMPARISON:  None. FINDINGS: Brain: No acute infarction, acute hemorrhage, hydrocephalus, extra-axial collection or mass lesion. Moderate scattered T2/FLAIR hyperintensities within the white matter, which are nonspecific but most likely related to chronic microvascular ischemic disease given patient age. Mild-to-moderate atrophy with ex vacuo ventricular dilation. The callosal angle is not acute and there is no crowding of sulci at the vertex. Dilated perivascular space in the inferior left basal ganglia. Small focus of susceptibility artifact in the lateral left cerebellum, most likely a prior microhemorrhage. Vascular: Major arterial flow voids are maintained skull base. Skull and upper cervical spine: Normal marrow signal. Sinuses/Orbits: Visualized sinuses are clear. No acute orbital findings. Other: Small left mastoid effusion. IMPRESSION: 1. No evidence of acute intracranial abnormality 2. Moderate chronic microvascular ischemic disease and mild-to-moderate atrophy. 3. Small left mastoid effusion. Electronically Signed   By: Gelene Mink  Barnett Applebaum MD   On: 07/14/2020 20:11   MR CERVICAL SPINE W WO CONTRAST  Result Date: 07/22/2020 CLINICAL DATA:  Hyper-reflexia and abnormal gait EXAM: MRI CERVICAL AND THORACIC SPINE  WITHOUT AND WITH CONTRAST TECHNIQUE: Multiplanar and multiecho pulse sequences of the cervical spine, to include the craniocervical junction and cervicothoracic junction, and the thoracic spine, were obtained without and with intravenous contrast. CONTRAST:  48mL GADAVIST GADOBUTROL 1 MMOL/ML IV SOLN COMPARISON:  None. FINDINGS: MRI CERVICAL SPINE FINDINGS Alignment: Physiologic. Vertebrae: No fracture, evidence of discitis, or bone lesion. Cord: Normal signal and morphology. Posterior Fossa, vertebral arteries, paraspinal tissues: Negative. Disc levels: Axial images are severely motion degraded. C1-2: Unremarkable. C2-3: Normal disc space and facet joints. There is no spinal canal stenosis. No neural foraminal stenosis. C3-4: Normal disc space and facet joints. There is no spinal canal stenosis. No neural foraminal stenosis. C4-5: Normal disc space and facet joints. There is no spinal canal stenosis. No neural foraminal stenosis. C5-6: Small left asymmetric disc bulge. There is no spinal canal stenosis. Mild left neural foraminal stenosis. C6-7: Left asymmetric disc bulge with uncovertebral spurring. There is no spinal canal stenosis. Moderate left neural foraminal stenosis. C7-T1: Normal disc space and facet joints. There is no spinal canal stenosis. No neural foraminal stenosis. MRI THORACIC SPINE FINDINGS Alignment:  Physiologic. Vertebrae: No fracture, evidence of discitis, or bone lesion. Cord:  Normal signal and morphology. Paraspinal and other soft tissues: Negative. Disc levels: Severely motion degraded axial images. No spinal canal stenosis. There is a small central disc protrusion noted at T7-8. No abnormal contrast enhancement. IMPRESSION: 1. Motion degraded study. 2. No acute abnormality of the cervical or thoracic spine. 3. Moderate left C6-7 and mild left C5-6 neural foraminal stenosis. 4. No spinal canal stenosis. Electronically Signed   By: Deatra Robinson M.D.   On: 07/22/2020 21:07   MR THORACIC  SPINE W WO CONTRAST  Result Date: 07/22/2020 CLINICAL DATA:  Hyper-reflexia and abnormal gait EXAM: MRI CERVICAL AND THORACIC SPINE WITHOUT AND WITH CONTRAST TECHNIQUE: Multiplanar and multiecho pulse sequences of the cervical spine, to include the craniocervical junction and cervicothoracic junction, and the thoracic spine, were obtained without and with intravenous contrast. CONTRAST:  82mL GADAVIST GADOBUTROL 1 MMOL/ML IV SOLN COMPARISON:  None. FINDINGS: MRI CERVICAL SPINE FINDINGS Alignment: Physiologic. Vertebrae: No fracture, evidence of discitis, or bone lesion. Cord: Normal signal and morphology. Posterior Fossa, vertebral arteries, paraspinal tissues: Negative. Disc levels: Axial images are severely motion degraded. C1-2: Unremarkable. C2-3: Normal disc space and facet joints. There is no spinal canal stenosis. No neural foraminal stenosis. C3-4: Normal disc space and facet joints. There is no spinal canal stenosis. No neural foraminal stenosis. C4-5: Normal disc space and facet joints. There is no spinal canal stenosis. No neural foraminal stenosis. C5-6: Small left asymmetric disc bulge. There is no spinal canal stenosis. Mild left neural foraminal stenosis. C6-7: Left asymmetric disc bulge with uncovertebral spurring. There is no spinal canal stenosis. Moderate left neural foraminal stenosis. C7-T1: Normal disc space and facet joints. There is no spinal canal stenosis. No neural foraminal stenosis. MRI THORACIC SPINE FINDINGS Alignment:  Physiologic. Vertebrae: No fracture, evidence of discitis, or bone lesion. Cord:  Normal signal and morphology. Paraspinal and other soft tissues: Negative. Disc levels: Severely motion degraded axial images. No spinal canal stenosis. There is a small central disc protrusion noted at T7-8. No abnormal contrast enhancement. IMPRESSION: 1. Motion degraded study. 2. No acute abnormality of the cervical or thoracic spine.  3. Moderate left C6-7 and mild left C5-6 neural  foraminal stenosis. 4. No spinal canal stenosis. Electronically Signed   By: Deatra Robinson M.D.   On: 07/22/2020 21:07   DG Hip Unilat W or Wo Pelvis 2-3 Views Left  Result Date: 07/14/2020 CLINICAL DATA:  Failure to thrive and weight loss EXAM: DG HIP (WITH OR WITHOUT PELVIS) 2-3V LEFT COMPARISON:  01/01/2020 FINDINGS: Pelvic ring is intact. Old healed pubic rami fractures are noted on the left stable from prior CT examination. Degenerative changes of the hip joints are noted right greater than left. No acute fracture or dislocation in the left hip is noted. Some widening of the left sacroiliac joint is noted which appears more prominent than that seen on prior CT. IMPRESSION: Slight widening of the left sacroiliac joint which may be projectional in nature. Degenerative changes of the hip joints bilaterally right greater than left. No acute fracture is seen. Old fractures in the left pubic rami are seen. Electronically Signed   By: Alcide Clever M.D.   On: 07/14/2020 20:56      Subjective: Patient seen and examined the bedside this morning.  Hemodynamically stable.  Overall comfortable.  Medically stable for discharge today.  I could not contact the family despite multiple attempts since last few days.  Discharge Exam: Vitals:   08/02/20 0807 08/02/20 1133  BP: 114/71 128/74  Pulse: 74 78  Resp: 18 18  Temp: 98.2 F (36.8 C) 98.8 F (37.1 C)  SpO2: 97% 96%   Vitals:   08/02/20 0029 08/02/20 0339 08/02/20 0807 08/02/20 1133  BP: 107/80 130/89 114/71 128/74  Pulse: 79 73 74 78  Resp: Temp: 98.4 F (36.9 C) 98.4 F (36.9 C) 98.2 F (36.8 C) 98.8 F (37.1 C)  TempSrc: Oral Oral Oral Oral  SpO2: 98% 98% 97% 96%  Weight:      Height:        General: Pt is alert, awake, not in acute distress Cardiovascular: RRR, S1/S2 +, no rubs, no gallops Respiratory: CTA bilaterally, no wheezing, no rhonchi Abdominal: Soft, NT, ND, bowel sounds + Extremities: no edema, no  cyanosis    The results of significant diagnostics from this hospitalization (including imaging, microbiology, ancillary and laboratory) are listed below for reference.     Microbiology: No results found for this or any previous visit (from the past 240 hour(s)).   Labs: BNP (last 3 results) Recent Labs    07/23/20 0337 07/24/20 0403 07/25/20 0102  BNP 94.5 98.4 77.2   Basic Metabolic Panel: Recent Labs  Lab 07/28/20 0153  NA 133*  K 4.5  CL 104  CO2 23  GLUCOSE 105*  BUN 24*  CREATININE 0.78  CALCIUM 9.1  MG 2.1   Liver Function Tests: No results for input(s): AST, ALT, ALKPHOS, BILITOT, PROT, ALBUMIN in the last 168 hours. No results for input(s): LIPASE, AMYLASE in the last 168 hours. No results for input(s): AMMONIA in the last 168 hours. CBC: Recent Labs  Lab 07/28/20 0153  WBC 7.1  HGB 10.6*  HCT 32.6*  MCV 88.8  PLT 341   Cardiac Enzymes: No results for input(s): CKTOTAL, CKMB, CKMBINDEX, TROPONINI in the last 168 hours. BNP: Invalid input(s): POCBNP CBG: No results for input(s): GLUCAP in the last 168 hours. D-Dimer No results for input(s): DDIMER in the last 72 hours. Hgb A1c No results for input(s): HGBA1C in the last 72 hours. Lipid Profile No results for input(s): CHOL, HDL,  LDLCALC, TRIG, CHOLHDL, LDLDIRECT in the last 72 hours. Thyroid function studies No results for input(s): TSH, T4TOTAL, T3FREE, THYROIDAB in the last 72 hours.  Invalid input(s): FREET3 Anemia work up No results for input(s): VITAMINB12, FOLATE, FERRITIN, TIBC, IRON, RETICCTPCT in the last 72 hours. Urinalysis    Component Value Date/Time   COLORURINE YELLOW 07/20/2020 1759   APPEARANCEUR HAZY (A) 07/20/2020 1759   LABSPEC 1.013 07/20/2020 1759   LABSPEC 1.020 05/27/2020 1541   PHURINE 7.0 07/20/2020 1759   GLUCOSEU NEGATIVE 07/20/2020 1759   HGBUR NEGATIVE 07/20/2020 1759   BILIRUBINUR NEGATIVE 07/20/2020 1759   BILIRUBINUR negative 05/27/2020 1541    KETONESUR NEGATIVE 07/20/2020 1759   PROTEINUR NEGATIVE 07/20/2020 1759   NITRITE NEGATIVE 07/20/2020 1759   LEUKOCYTESUR NEGATIVE 07/20/2020 1759   Sepsis Labs Invalid input(s): PROCALCITONIN,  WBC,  LACTICIDVEN Microbiology No results found for this or any previous visit (from the past 240 hour(s)).  Please note: You were cared for by a hospitalist during your hospital stay. Once you are discharged, your primary care physician will handle any further medical issues. Please note that NO REFILLS for any discharge medications will be authorized once you are discharged, as it is imperative that you return to your primary care physician (or establish a relationship with a primary care physician if you do not have one) for your post hospital discharge needs so that they can reassess your need for medications and monitor your lab values.    Time coordinating discharge: 40 minutes  SIGNED:   Burnadette PopAmrit Cai Flott, MD  [facility] 08/02/2020, 1:01 PM Pager [phone]  If 7PM-7AM, please contact night-coverage [email or web address] Password [email or web address]

## 2020-08-02 NOTE — Progress Notes (Signed)
Patient arrived to unit. Boggy heels noted, discussed with night nurse for prevention. Foam in place over 2.3 x 1 cm area unblanchable red skin over bony prominence on left buttock. Thrush noted on tongue. Skin around left eye appears swollen and red, per daughter is usual appearance. Educated patient and daughter on unit routine. Patient resting comfortably with call bell in place.

## 2020-08-03 ENCOUNTER — Other Ambulatory Visit: Payer: Self-pay

## 2020-08-03 ENCOUNTER — Encounter (HOSPITAL_COMMUNITY): Payer: Self-pay | Admitting: Physical Medicine and Rehabilitation

## 2020-08-03 DIAGNOSIS — R5381 Other malaise: Secondary | ICD-10-CM | POA: Diagnosis not present

## 2020-08-03 LAB — COMPREHENSIVE METABOLIC PANEL
ALT: 16 U/L (ref 0–44)
AST: 16 U/L (ref 15–41)
Albumin: 2.9 g/dL — ABNORMAL LOW (ref 3.5–5.0)
Alkaline Phosphatase: 45 U/L (ref 38–126)
Anion gap: 7 (ref 5–15)
BUN: 31 mg/dL — ABNORMAL HIGH (ref 8–23)
CO2: 21 mmol/L — ABNORMAL LOW (ref 22–32)
Calcium: 9.3 mg/dL (ref 8.9–10.3)
Chloride: 106 mmol/L (ref 98–111)
Creatinine, Ser: 0.73 mg/dL (ref 0.44–1.00)
GFR, Estimated: >60 mL/min (ref >60)
Glucose, Bld: 96 mg/dL (ref 70–99)
Potassium: 4.2 mmol/L (ref 3.5–5.1)
Sodium: 134 mmol/L — ABNORMAL LOW (ref 135–145)
Total Bilirubin: 0.5 mg/dL (ref 0.3–1.2)
Total Protein: 5.7 g/dL — ABNORMAL LOW (ref 6.5–8.1)

## 2020-08-03 LAB — CBC WITH DIFFERENTIAL/PLATELET
Abs Immature Granulocytes: 0.06 10*3/uL (ref 0.00–0.07)
Basophils Absolute: 0.1 10*3/uL (ref 0.0–0.1)
Basophils Relative: 1 %
Eosinophils Absolute: 0.1 10*3/uL (ref 0.0–0.5)
Eosinophils Relative: 1 %
HCT: 35 % — ABNORMAL LOW (ref 36.0–46.0)
Hemoglobin: 11.4 g/dL — ABNORMAL LOW (ref 12.0–15.0)
Immature Granulocytes: 1 %
Lymphocytes Relative: 30 %
Lymphs Abs: 2.5 10*3/uL (ref 0.7–4.0)
MCH: 28.9 pg (ref 26.0–34.0)
MCHC: 32.6 g/dL (ref 30.0–36.0)
MCV: 88.6 fL (ref 80.0–100.0)
Monocytes Absolute: 0.8 10*3/uL (ref 0.1–1.0)
Monocytes Relative: 10 %
Neutro Abs: 4.7 10*3/uL (ref 1.7–7.7)
Neutrophils Relative %: 57 %
Platelets: 391 10*3/uL (ref 150–400)
RBC: 3.95 MIL/uL (ref 3.87–5.11)
RDW: 14.7 % (ref 11.5–15.5)
WBC: 8.3 10*3/uL (ref 4.0–10.5)
nRBC: 0 % (ref 0.0–0.2)

## 2020-08-03 MED ORDER — LIDOCAINE HCL URETHRAL/MUCOSAL 2 % EX GEL: 1.0000 "application " | Freq: Once | CUTANEOUS | Status: DC

## 2020-08-03 MED ORDER — GERHARDT'S BUTT CREAM
TOPICAL_CREAM | Freq: Two times a day (BID) | CUTANEOUS | Status: DC
  Filled 2020-08-03: qty 1

## 2020-08-03 MED ORDER — LIDOCAINE HCL URETHRAL/MUCOSAL 2 % EX GEL: 1.0000 "application " | CUTANEOUS | Status: DC | PRN

## 2020-08-03 NOTE — Progress Notes (Signed)
Orthopedic Tech Progress Note Patient Details:  Sheila Andrade January 11, 1935 [id number]  Ortho Devices Type of Ortho Device: Prafo boot/shoe Ortho Device/Splint Location: Bi LE Ortho Device/Splint Interventions: Application   Post Interventions Patient Tolerated: Well Instructions Provided: Adjustment of device  Almeter Westhoff E Xoie Kreuser 08/03/2020, 5:19 PM

## 2020-08-03 NOTE — Plan of Care (Signed)
  Problem: RH Balance Goal: LTG Patient will maintain dynamic sitting balance (PT) Description: LTG:  Patient will maintain dynamic sitting balance with assistance during mobility activities (PT) Flowsheets (Taken 08/03/2020 1630) LTG: Pt will maintain dynamic sitting balance during mobility activities with:: Supervision/Verbal cueing Goal: LTG Patient will maintain dynamic standing balance (PT) Description: LTG:  Patient will maintain dynamic standing balance with assistance during mobility activities (PT) Flowsheets (Taken 08/03/2020 1630) LTG: Pt will maintain dynamic standing balance during mobility activities with:: Contact Guard/Touching assist   Problem: Sit to Stand Goal: LTG:  Patient will perform sit to stand with assistance level (PT) Description: LTG:  Patient will perform sit to stand with assistance level (PT) Flowsheets (Taken 08/03/2020 1630) LTG: PT will perform sit to stand in preparation for functional mobility with assistance level: Contact Guard/Touching assist   Problem: RH Bed Mobility Goal: LTG Patient will perform bed mobility with assist (PT) Description: LTG: Patient will perform bed mobility with assistance, with/without cues (PT). Flowsheets (Taken 08/03/2020 1630) LTG: Pt will perform bed mobility with assistance level of: Contact Guard/Touching assist   Problem: RH Bed to Chair Transfers Goal: LTG Patient will perform bed/chair transfers w/assist (PT) Description: LTG: Patient will perform bed to chair transfers with assistance (PT). Flowsheets (Taken 08/03/2020 1630) LTG: Pt will perform Bed to Chair Transfers with assistance level: Contact Guard/Touching assist   Problem: RH Car Transfers Goal: LTG Patient will perform car transfers with assist (PT) Description: LTG: Patient will perform car transfers with assistance (PT). Flowsheets (Taken 08/03/2020 1630) LTG: Pt will perform car transfers with assist:: Minimal Assistance - Patient > 75%   Problem: RH  Ambulation Goal: LTG Patient will ambulate in controlled environment (PT) Description: LTG: Patient will ambulate in a controlled environment, # of feet with assistance (PT). Flowsheets (Taken 08/03/2020 1630) LTG: Pt will ambulate in controlled environ  assist needed:: Minimal Assistance - Patient > 75% LTG: Ambulation distance in controlled environment: 16ft with LRAD Goal: LTG Patient will ambulate in home environment (PT) Description: LTG: Patient will ambulate in home environment, # of feet with assistance (PT). Flowsheets (Taken 08/03/2020 1630) LTG: Pt will ambulate in home environ  assist needed:: Contact Guard/Touching assist LTG: Ambulation distance in home environment: 85ft with LRAD   Problem: RH Wheelchair Mobility Goal: LTG Patient will propel w/c in controlled environment (PT) Description: LTG: Patient will propel wheelchair in controlled environment, # of feet with assist (PT) Flowsheets (Taken 08/03/2020 1630) LTG: Pt will propel w/c in controlled environ  assist needed:: Minimal Assistance - Patient > 75% LTG: Propel w/c distance in controlled environment: 127ft Goal: LTG Patient will propel w/c in home environment (PT) Description: LTG: Patient will propel wheelchair in home environment, # of feet with assistance (PT). Flowsheets (Taken 08/03/2020 1630) LTG: Pt will propel w/c in home environ  assist needed:: Minimal Assistance - Patient > 75% Distance: wheelchair distance in controlled environment: 50   Problem: RH Stairs Goal: LTG Patient will ambulate up and down stairs w/assist (PT) Description: LTG: Patient will ambulate up and down # of stairs with assistance (PT) Flowsheets (Taken 08/03/2020 1630) LTG: Pt will ambulate up/down stairs assist needed:: Moderate Assistance - Patient 50 - 74% LTG: Pt will  ambulate up and down number of stairs: 2 steps without a rail to access home

## 2020-08-03 NOTE — Evaluation (Addendum)
Occupational Therapy Assessment and Plan  Patient Details  Name: Sheila Andrade MRN: [id number] Date of Birth: 1935-11-27  OT Diagnosis: abnormal posture, apraxia, cognitive deficits, and muscle weakness (generalized) Rehab Potential: Rehab Potential (ACUTE ONLY): Good ELOS: 2 weeks   Today's Date: 08/03/2020 OT Individual Time: 0800-0910; and 1405-1505 OT Individual Time Calculation (min): 70 min  ; and 60 min  Hospital Problem: Active Problems:   Debility   Pressure injury of skin   Past Medical History:  Past Medical History:  Diagnosis Date   Hot flashes 06/06/2020   Hypertension    Past Surgical History: History reviewed. No pertinent surgical history.  Assessment & Plan Clinical Impression: Sheila Andrade is an 85 year old female with history of HTN, fall with pelvic Fx 01/2020 with progressive decline, recent Covid infection complicated by urinary retention/UTI, abdominal pain w/anorexia and wt loss (negative EGD 7/22), severe recurrent depression who was admitted on 07/14/20 with confusion, falls, weakness and inability to stand. She was briefly started on Rocephin due to + UA and culture w/Klebsiella Oxytoca/ornithia felt to be due to colonization therefore antibiotics d/c.   She also expressed severe depression with SI ideation and psychiatry consulted for input. She was also noted to have shuffling gait with tremors and freezing therefore neurology consulted for input on Parkinsonian features. Psychiatry and neurology has been following during hospitalization to help determine cause --Parkinsonism due to medication effect v/s severe depression v/s pseudodementia.  MRI C/T/L spine negative for compression or explanation of BLE weakness. Outpatient neurology work up with EMG/NCS and formal neurocognitive evaluation recommended by Dr. Quinn Axe. F Abilify was discontinued, Effexor slowly being tapered off, Trazodone added for sleep wake disuruption with atarax tid prn for anxiety--ativan  caused excessive sedation, Megace for appetite stimulant as well as recommendations for outpatient psych referral for further medication management. Dr. Lurline Hare w/pschiatiry recommends lowering Prozac to 10 mg starting 07/30 "to see if it aided with memory issues" question causing memory issues.   Family has expressed concerns about her mood as well as intermittent reports of SI as well as poor po intake.  IVF were resumed  briefly per request. Suicide sitters d/c.  RD has been following input/asssitance with supplements.  Vitamin B12 deficiency boderline low--226 and was supplemented. Her mood is stable and intake averaging 505.  She continues to be limited by weakness, fear of falling, balance deficits with posterior lean, delay in processing and decreased awareness of deficits. .  Patient transferred to CIR on 08/02/2020 .    Patient currently requires mod to max with basic self-care skills secondary to muscle weakness, decreased cardiorespiratoy endurance, motor apraxia, decreased coordination, and decreased motor planning, decreased initiation, decreased attention, decreased awareness, decreased problem solving, decreased safety awareness, decreased memory, and delayed processing, and decreased sitting balance, decreased standing balance, decreased postural control, and decreased balance strategies.  Prior to hospitalization, patient could complete ADLs with min.  Patient will benefit from skilled intervention to decrease level of assist with basic self-care skills prior to discharge home with care partner.  Anticipate patient will require 24 hour supervision and minimal physical assistance and follow up home health.      OT Evaluation Precautions/Restrictions  Precautions Precautions: Fall Precaution Comments: heavy posterior lean Restrictions Weight Bearing Restrictions: No General Chart Reviewed: Yes Pain Pain Assessment Pain Scale: 0-10 Pain Score: 0-No pain Home Living/Prior  Functioning Home Living Family/patient expects to be discharged to:: Private residence Living Arrangements: Children, Other (Comment) Available Help at Discharge: Family, Friend(s), Available PRN/intermittently  Type of Home: House Home Access: Stairs to enter [employer] of Steps: 2+1 Entrance Stairs-Rails: None Home Layout: Two level, Able to live on main level with bedroom/bathroom Bathroom Shower/Tub: Chiropodist: Handicapped height Bathroom Accessibility: Yes Additional Comments: has been living with her dtr since pelvic fx ~ 8 mos ago  Lives With: Daughter Prior Function Level of Independence: Needs assistance with ADLs, Requires assistive device for independence, Needs assistance with gait, Needs assistance with tranfers  Able to Take Stairs?: Yes Vision Baseline Vision/History: Glaucoma (left eye) Wears Glasses: At all times Vision Assessment?: Yes Eye Alignment: Within Functional Limits Ocular Range of Motion: Within Functional Limits Alignment/Gaze Preference: Within Defined Limits Tracking/Visual Pursuits: Decreased smoothness of eye movement to LEFT superior field;Decreased smoothness of eye movement to LEFT inferior field Perception  Perception: Impaired Inattention/Neglect: Does not attend to left visual field Praxis Praxis: Impaired Praxis Impairment Details: Motor planning Cognition Overall Cognitive Status: Impaired/Different from baseline Arousal/Alertness: Awake/alert Orientation Level: Person;Place;Situation Person: Oriented Place: Oriented Situation: Oriented Year: 2022 Month: July Day of Week: Correct Memory: Impaired Memory Impairment: Retrieval deficit;Storage deficit;Decreased short term memory Immediate Memory Recall: Sock;Blue;Bed Memory Recall Sock: Without Cue Memory Recall Blue: Without Cue Memory Recall Bed: Without Cue Attention: Sustained Sustained Attention: Impaired Sustained Attention Impairment:  Functional basic Awareness: Impaired Awareness Impairment: Emergent impairment Problem Solving: Impaired Problem Solving Impairment: Functional basic Executive Function: Initiating;Self Correcting Initiating: Impaired Initiating Impairment: Functional basic Self Correcting: Impaired Self Correcting Impairment: Functional basic Safety/Judgment: Impaired Sensation Sensation Light Touch: Appears Intact Hot/Cold: Appears Intact Proprioception: Appears Intact Stereognosis: Not tested Coordination Gross Motor Movements are Fluid and Coordinated: No Fine Motor Movements are Fluid and Coordinated: No Coordination and Movement Description: mild dysmetria on the L, but reports shoulder injury and multiple broken bones on the LLE Finger Nose Finger Test: mild dysmetria on the LUE Heel Shin Test: mild dysmetria on the LLE Motor  Motor Motor: Motor apraxia;Motor perseverations;Abnormal postural alignment and control Motor - Skilled Clinical Observations: PD presentation with heavy posterior bias. poor motoro planning  Trunk/Postural Assessment  Cervical Assessment Cervical Assessment: Exceptions to [facility] (forward and downward head posture) Thoracic Assessment Thoracic Assessment: Exceptions to [facility] (kyphotic) Lumbar Assessment Lumbar Assessment: Exceptions to [facility] (posterior pelvic tilt) Postural Control Postural Control: Deficits on evaluation Head Control: downward gaze and head posture despite max multimodal cues to correct into upright Righting Reactions: very slow with significant posterior bias Protective Responses: poor Postural Limitations: kyphotic  Balance Balance Balance Assessed: Yes Extremity/Trunk Assessment RUE Assessment RUE Assessment: Exceptions to [facility] General Strength Comments: 4-/5 UE and 3+/5 grip LUE Assessment LUE Assessment: Exceptions to [facility] General Strength Comments: shoulder tested to 90 degress AROM only due to reports of recent rotator cuff injury not  treated with resultant pain with movement; 4-/5 biceps/triceps; 3+/5 grip  Care Tool Care Tool Self Care Eating   Eating Assist Level: Set up assist    Oral Care    Oral Care Assist Level: Moderate Assistance - Patient 50 - 74% (standing)    Bathing         Assist Level: Moderate Assistance - Patient 50 - 74%    Upper Body Dressing(including orthotics)   What is the patient wearing?: Bra;Pull over shirt   Assist Level: Minimal Assistance - Patient > 75%    Lower Body Dressing (excluding footwear)   What is the patient wearing?: Underwear/pull up;Pants Assist for lower body dressing: Maximal Assistance - Patient 25 - 49%    Putting on/Taking off  footwear   What is the patient wearing?: Non-skid slipper socks Assist for footwear: Maximal Assistance - Patient 25 - 49%       Care Tool Toileting Toileting activity   Assist for toileting: Maximal Assistance - Patient 25 - 49%     Care Tool Bed Mobility Roll left and right activity   Roll left and right assist level: Maximal Assistance - Patient 25 - 49%    Sit to lying activity   Sit to lying assist level: Minimal Assistance - Patient > 75%    Lying to sitting edge of bed activity   Lying to sitting edge of bed assist level: Maximal Assistance - Patient 25 - 49%     Care Tool Transfers Sit to stand transfer   Sit to stand assist level: Maximal Assistance - Patient 25 - 49%    Chair/bed transfer   Chair/bed transfer assist level: Maximal Assistance - Patient 25 - 49%     Toilet transfer   Assist Level: Moderate Assistance - Patient 50 - 74%     Care Tool Cognition Expression of Ideas and Wants Expression of Ideas and Wants: Some difficulty - exhibits some difficulty with expressing needs and ideas (e.g, some words or finishing thoughts) or speech is not clear   Understanding Verbal and Non-Verbal Content Understanding Verbal and Non-Verbal Content: Usually understands - understands most conversations, but misses  some part/intent of message. Requires cues at times to understand   Memory/Recall Ability *first 3 days only Memory/Recall Ability *first 3 days only: Current season;That he or she is in a hospital/hospital unit;Location of own room    Refer to Care Plan for Long Term Goals  SHORT TERM GOAL WEEK 1 OT Short Term Goal 1 (Week 1): Pt will complete toilet transfer with min assist OT Short Term Goal 2 (Week 1): Pt will stand at [facility] with CGA and unilateral hand release for 15 seconds in preperation for self care OT Short Term Goal 3 (Week 1): Pt will dress LB with mod assist OT Short Term Goal 4 (Week 1): Pt will shower UB/LB using AE and AD as needed with min assist.  Recommendations for other services: Therapeutic Recreation  Other leisure    Skilled Therapeutic Intervention ADL ADL Grooming: Setup Where Assessed-Grooming: Sitting at sink Upper Body Bathing: Minimal assistance;Minimal cueing Where Assessed-Upper Body Bathing: Sitting at sink Lower Body Bathing: Maximal assistance;Moderate cueing Where Assessed-Lower Body Bathing: Sitting at sink;Standing at sink Upper Body Dressing: Minimal assistance;Moderate cueing Where Assessed-Upper Body Dressing: Sitting at sink Lower Body Dressing: Maximal assistance;Moderate cueing Where Assessed-Lower Body Dressing: Sitting at sink;Standing at sink Toileting: Moderate assistance;Moderate cueing Where Assessed-Toileting: Other (Comment) (3 in1 over toilet) Toilet Transfer: Moderate assistance;Moderate verbal cueing Toilet Transfer Method: Stand pivot Toilet Transfer Equipment: Bedside commode;Grab bars Mobility  Bed Mobility Bed Mobility: Rolling Left;Left Sidelying to Sit Rolling Left: Minimal Assistance - Patient > 75% Left Sidelying to Sit: Moderate Assistance - Patient 50-74% Transfers Sit to Stand: Moderate Assistance - Patient 50-74% Stand to Sit: Moderate Assistance - Patient 50-74%  Skilled Intervention: First session: Pt supine  in bed, dozing off but easily aroused awake.  No c/o pain at rest.  Initial evaluation complete and collaborated with pt and dtr regarding POC.  Appropriate wheelchair provided with seat cushion and BLE leg rests.  Pt requested using the toilet however upon sitting and attempting, unable to void bowel or bladder.  Pt reporting she is afraid to fall and appeared hesitant when standing with strong  posterior bias.  Pt completed self care and functional mobility with above levels of assist required.  Direct hand off to PT at end of session.  Second Session:  Pt sidelying in bed, reports not feeling up to getting out of bed but willing to complete bed level therex.  OT continued to take occupational profile while completing therex during intermittent rest breaks.  Per pt, past leisure interests include journalism, reading, fashion, traveling, eating new foods.  Pt reports since her fall/injury in January of this year and then functional decline thereafter, she hasn't felt interested in her leisure activities like she used to.  Pt also reports awareness that her daughters feel that she is depressed however pt reports she personally thinks she is just feeling sad due to recent functional decline.  She reported that she looks forward to recovering from this and getting back to the things she used to do.    Therapist provided pt with notepads of different sizes and writing utensils to facilitate participation in writing/journaling.  Pt reports she very much appreciates this.    Pt completed RUE resistive therex including triceps press, lat pull downs, and shoulder external rotations using level 3 band. 3 x 10 each.  Pt then provided with moderate resistive sponge and completed 3 x 15 each hand to facilitate increased strength for improved functional mobility and self care.    Call bell in reach, bed alarm on at end of session.   Discharge Criteria: Patient will be discharged from OT if patient refuses treatment 3  consecutive times without medical reason, if treatment goals not met, if there is a change in medical status, if patient makes no progress towards goals or if patient is discharged from hospital.  The above assessment, treatment plan, treatment alternatives and goals were discussed and mutually agreed upon: by patient and by family  Ezekiel Slocumb 08/03/2020, 12:45 PM

## 2020-08-03 NOTE — Evaluation (Signed)
Physical Therapy Assessment and Plan  Patient Details  Name: Sheila Andrade MRN: [id number] Date of Birth: 1935-11-01  PT Diagnosis: Abnormal posture, Abnormality of gait, Coordination disorder, Dizziness and giddiness, and Muscle weakness Rehab Potential: Fair ELOS: 14-17days   Today's Date: 08/03/2020 PT Individual Time: 0910-1003 PT Individual Time Calculation (min): 53 min    Hospital Problem: Active Problems:   Debility   Pressure injury of skin   Past Medical History:  Past Medical History:  Diagnosis Date   Hot flashes 06/06/2020   Hypertension    Past Surgical History: History reviewed. No pertinent surgical history.  Assessment & Plan Clinical Impression: Patient is a 85 year old female with history of HTN, fall with pelvic Fx 01/2020 with progressive decline, recent Covid infection complicated by urinary retention/UTI, abdominal pain w/anorexia and wt loss (negative EGD 7/22), severe recurrent depression who was admitted on 07/14/20 with confusion, falls, weakness and inability to stand. She was briefly started on Rocephin due to + UA and culture w/Klebsiella Oxytoca/ornithia felt to be due to colonization therefore antibiotics d/c.   She also expressed severe depression with SI ideation and psychiatry consulted for input. She was also noted to have shuffling gait with tremors and freezing therefore neurology consulted for input on Parkinsonian features. Psychiatry and neurology has been following during hospitalization to help determine cause --Parkinsonism due to medication effect v/s severe depression v/s pseudodementia.  MRI C/T/L spine negative for compression or explanation of BLE weakness. Outpatient neurology work up with EMG/NCS and formal neurocognitive evaluation recommended by Dr. Quinn Axe. F Abilify was discontinued, Effexor slowly being tapered off, Trazodone added for sleep wake disuruption with atarax tid prn for anxiety--ativan caused excessive sedation, Megace for  appetite stimulant as well as recommendations for outpatient psych referral for further medication management. Dr. Lurline Hare w/pschiatiry recommends lowering Prozac to 10 mg starting 07/30 "to see if it aided with memory issues" question causing memory issues.   Family has expressed concerns about her mood as well as intermittent reports of SI as well as poor po intake.  IVF were resumed  briefly per request. Suicide sitters d/c.  RD has been following input/asssitance with supplements.  Vitamin B12 deficiency boderline low--226 and was supplemented. Her mood is stable and intake averaging 505.  She continues to be limited by weakness, fear of falling, balance deficits with posterior lean, delay in processing and decreased awareness of deficits.  Patient transferred to CIR on 08/02/2020 .   Patient currently requires max with mobility secondary to muscle weakness and muscle joint tightness, decreased cardiorespiratoy endurance, impaired timing and sequencing, abnormal tone, unbalanced muscle activation, motor apraxia, decreased coordination, and decreased motor planning, decreased midline orientation, decreased attention, decreased awareness, decreased problem solving, and decreased safety awareness, and decreased sitting balance, decreased standing balance, decreased postural control, and decreased balance strategies.  Prior to hospitalization, patient was supervision with mobility and lived with Daughter in a House home.  Home access is 2+1Stairs to enter.  Patient will benefit from skilled PT intervention to maximize safe functional mobility, minimize fall risk, and decrease caregiver burden for planned discharge home with 24 hour assist.  Anticipate patient will benefit from follow up [facility] at discharge.  PT - End of Session Activity Tolerance: Tolerates < 10 min activity, no significant change in vital signs Endurance Deficit: Yes Endurance Deficit Description: significantly limited standing tolerance; reports  feeling fatigued after sinkside self care. PT Assessment Rehab Potential (ACUTE/IP ONLY): Fair PT Barriers to Discharge: Inaccessible home environment;Home environment access/layout  PT Patient demonstrates impairments in the following area(s): Balance;Behavior;Endurance;Motor;Nutrition;Pain;Sensory;Safety PT Transfers Functional Problem(s): Bed Mobility;Bed to Chair;Car;Furniture;Floor PT Locomotion Functional Problem(s): Ambulation;Wheelchair Mobility PT Plan PT Intensity: Minimum of 1-2 x/day ,45 to 90 minutes PT Frequency: 5 out of 7 days PT Duration Estimated Length of Stay: 14-17days PT Treatment/Interventions: Ambulation/gait training;Community reintegration;DME/adaptive equipment instruction;Neuromuscular re-education;Psychosocial support;UE/LE Strength taining/ROM;Wheelchair propulsion/positioning;Stair training;Balance/vestibular training;Cognitive remediation/compensation;Discharge planning;Disease management/prevention;Functional electrical stimulation;Functional mobility training;Patient/family education;Splinting/orthotics;Pain management;Therapeutic Exercise;Therapeutic Activities;Skin care/wound management;UE/LE Coordination activities;Visual/perceptual remediation/compensation PT Transfers Anticipated Outcome(s): CGA with LRAD PT Locomotion Anticipated Outcome(s): min-CGA gait with LRAD for house hold distances. PT Recommendation Recommendations for Other Services: Neuropsych consult;Therapeutic Recreation consult Therapeutic Recreation Interventions: Stress management Follow Up Recommendations: Home health PT Patient destination: Home Equipment Recommended: To be determined   PT Evaluation Precautions/Restrictions Precautions Precautions: Fall Precaution Comments: heavy posterior lean Restrictions Weight Bearing Restrictions: No General   Vital Signs  Pain Pain Assessment Pain Scale: 0-10 Pain Score: 0-No pain Home Living/Prior Functioning Home Living Available  Help at Discharge: Family;Friend(s);Available PRN/intermittently Type of Home: House Home Access: Stairs to enter [employer] of Steps: 2+1 Entrance Stairs-Rails: None Home Layout: Two level;Able to live on main level with bedroom/bathroom Bathroom Shower/Tub: Chiropodist: Handicapped height Bathroom Accessibility: Yes Additional Comments: has been living with her dtr since pelvic fx ~ 8 mos ago  Lives With: Daughter Prior Function Level of Independence: Needs assistance with ADLs;Requires assistive device for independence;Needs assistance with gait;Needs assistance with tranfers  Able to Take Stairs?: Yes Vision/Perception  Vision - Assessment Eye Alignment: Within Functional Limits Ocular Range of Motion: Within Functional Limits Alignment/Gaze Preference: Within Defined Limits Tracking/Visual Pursuits: Decreased smoothness of eye movement to LEFT superior field;Decreased smoothness of eye movement to LEFT inferior field Saccades: Additional eye shifts occurred during testing Perception Perception: Impaired Inattention/Neglect: Does not attend to left visual field Praxis Praxis: Impaired Praxis Impairment Details: Motor planning  Cognition   Sensation Sensation Light Touch: Appears Intact Coordination Gross Motor Movements are Fluid and Coordinated: No Coordination and Movement Description: mild dysmetria on the L, but reports shoulder injury and multiple broken bones on the LLE Finger Nose Finger Test: mild dysmetria on the LUE Heel Shin Test: mild dysmetria on the LLE Motor  Motor Motor: Motor apraxia;Motor perseverations;Abnormal postural alignment and control Motor - Skilled Clinical Observations: PD presentation with heavy posterior bias. poor motoro planning   Trunk/Postural Assessment  Cervical Assessment Cervical Assessment: Exceptions to [facility] (forward and downward head posture) Thoracic Assessment Thoracic Assessment: Exceptions  to [facility] (kyphotic) Lumbar Assessment Lumbar Assessment: Exceptions to [facility] (posterior pelvic tilt) Postural Control Postural Control: Deficits on evaluation Head Control: downward gaze and head posture despite max multimodal cues to correct into upright Righting Reactions: very slow with significant posterior bias Protective Responses: poor with posterior bias Postural Limitations: kyphotic  Balance Balance Balance Assessed: Yes Static Sitting Balance Static Sitting - Balance Support: Bilateral upper extremity supported Static Sitting - Level of Assistance: 5: Stand by assistance Dynamic Sitting Balance Dynamic Sitting - Balance Support: Bilateral upper extremity supported Dynamic Sitting - Level of Assistance: 3: Mod assist Static Standing Balance Static Standing - Balance Support: Bilateral upper extremity supported Static Standing - Level of Assistance: 2: Max assist Extremity Assessment      RLE Assessment General Strength Comments: grossly 4-/5 proximal to distal LLE Assessment General Strength Comments: grossly 4-/5 proximal to distal  Care Tool Care Tool Bed Mobility Roll left and right activity   Roll left and right assist level: Maximal Assistance - Patient  25 - 49%    Sit to lying activity   Sit to lying assist level: Minimal Assistance - Patient > 75%    Lying to sitting edge of bed activity   Lying to sitting edge of bed assist level: Maximal Assistance - Patient 25 - 49%     Care Tool Transfers Sit to stand transfer   Sit to stand assist level: Maximal Assistance - Patient 25 - 49%    Chair/bed transfer   Chair/bed transfer assist level: Maximal Assistance - Patient 25 - 49%     Toilet transfer   Assist Level: Moderate Assistance - Patient 50 - 74%    Car transfer Car transfer activity did not occur: Safety/medical concerns        Care Tool Locomotion Ambulation Ambulation activity did not occur: Safety/medical concerns        Walk 10 feet  activity Walk 10 feet activity did not occur: Safety/medical concerns       Walk 50 feet with 2 turns activity Walk 50 feet with 2 turns activity did not occur: Safety/medical concerns      Walk 150 feet activity Walk 150 feet activity did not occur: Safety/medical concerns      Walk 10 feet on uneven surfaces activity Walk 10 feet on uneven surfaces activity did not occur: Safety/medical concerns      Stairs Stair activity did not occur: Safety/medical concerns        Walk up/down 1 step activity Walk up/down 1 step or curb (drop down) activity did not occur: Safety/medical concerns     Walk up/down 4 steps activity did not occuR: Safety/medical concerns  Walk up/down 4 steps activity      Walk up/down 12 steps activity Walk up/down 12 steps activity did not occur: Safety/medical concerns      Pick up small objects from floor Pick up small object from the floor (from standing position) activity did not occur: Safety/medical concerns      Wheelchair       Wheelchair assist level: Moderate Assistance - Patient 50 - 74% Max wheelchair distance: 50  Wheel 50 feet with 2 turns activity   Assist Level: Moderate Assistance - Patient 50 - 74%  Wheel 150 feet activity   Assist Level: Maximal Assistance - Patient 25 - 49%    Refer to Care Plan for Long Term Goals  SHORT TERM GOAL WEEK 1 PT Short Term Goal 1 (Week 1): Pt will transfer to [facility] with mod assist PT Short Term Goal 2 (Week 1): Pt will perform bed mobility with min assist PT Short Term Goal 3 (Week 1): Pt will tolerate standing up to 2 minutes PT Short Term Goal 4 (Week 1): Pt will ambulate 18ft with min assist  Recommendations for other services: Neuropsych and Therapeutic Recreation  Stress management  Skilled Therapeutic Intervention  Pt received sitting in WC and agreeable to PT. PT instructed patient in PT Evaluation and initiated treatment intervention; see above for results. PT educated patient in [place], rehab  potential, rehab goals, and discharge recommendations along with recommendation for follow-up rehabilitation services.   Sit<>stand with total A without AD and max assist with RW. Gait training as listed below with mod-max assist, mild improvement in anterior weight shift with increased distance.  Car transfer training with mod assist and use of RW. Unable to perform without AD due to heavy posterior lean. Stair management training with mod assist/min assist on 3inch/6 inch step x 2 each as listed below.  Patient returned to room and performed stand pivot to recliner with RW and mod assist. Pt left sitting in recliner with call bell in reach and all needs met.      Mobility Bed Mobility Bed Mobility: Rolling Left;Left Sidelying to Sit Rolling Left: Minimal Assistance - Patient > 75% Left Sidelying to Sit: Moderate Assistance - Patient 50-74% Transfers Transfers: Sit to Stand;Stand to Sit;Stand Pivot Transfers Sit to Stand: Moderate Assistance - Patient 50-74% Stand to Sit: Moderate Assistance - Patient 50-74%;Maximal Assistance - Patient 25-49% Stand Pivot Transfers: Moderate Assistance - Patient 50 - 74%;Maximal Assistance - Patient 25 - 49% Stand Pivot Transfer Details: Tactile cues for initiation;Tactile cues for sequencing;Tactile cues for weight shifting;Tactile cues for posture;Verbal cues for sequencing;Verbal cues for technique;Verbal cues for precautions/safety;Verbal cues for safe use of DME/AE Transfer (Assistive device): Rolling walker Locomotion  Gait Ambulation: Yes Gait Assistance: 2 Helpers (mod A with +2 for WC follow) Gait Distance (Feet): 40 Feet Assistive device: Rolling walker Gait Assistance Details: heavy posterior bias, mild imrpovement with increased distance. Gait Gait: Yes Gait Pattern: Poor foot clearance - left;Poor foot clearance - right;Narrow base of support;Shuffle Gait velocity: toe walking Stairs / Additional Locomotion Stairs: Yes Stairs  Assistance: Moderate Assistance - Patient 50 - 74% Stair Management Technique: Two rails Number of Stairs: 4 Height of Stairs: 3 (and 6) Wheelchair Mobility Wheelchair Mobility: Yes Wheelchair Assistance: Maximal Assistance - Patient 25 - 49% Wheelchair Propulsion: Both upper extremities Wheelchair Parts Management: Needs assistance Distance: 50   Discharge Criteria: Patient will be discharged from PT if patient refuses treatment 3 consecutive times without medical reason, if treatment goals not met, if there is a change in medical status, if patient makes no progress towards goals or if patient is discharged from hospital.  The above assessment, treatment plan, treatment alternatives and goals were discussed and mutually agreed upon: by patient and by family  Lorie Phenix 08/03/2020, 10:47 AM

## 2020-08-03 NOTE — Progress Notes (Signed)
PROGRESS NOTE   Subjective/Complaints: Has brought in her Lumigan- tried to order and it appears the Latanaprost she is receiving is an acceptable alternative- asked nursing to educate She is complaining or urinary retention- required cath  ROS: +urinary retention   Objective:   No results found. Recent Labs    08/03/20 0534  WBC 8.3  HGB 11.4*  HCT 35.0*  PLT 391   Recent Labs    08/03/20 0534  NA 134*  K 4.2  CL 106  CO2 21*  GLUCOSE 96  BUN 31*  CREATININE 0.73  CALCIUM 9.3    Intake/Output Summary (Last 24 hours) at 08/03/2020 1509 Last data filed at 08/03/2020 1300 Gross per 24 hour  Intake 240 ml  Output 400 ml  Net -160 ml     Pressure Injury 08/02/20 Buttocks Left Stage 1 -  Intact skin with non-blanchable redness of a localized area usually over a bony prominence. (Active)  08/02/20 1845  Location: Buttocks  Location Orientation: Left  Staging: Stage 1 -  Intact skin with non-blanchable redness of a localized area usually over a bony prominence.  Wound Description (Comments):   Present on Admission: Yes    Physical Exam: Vital Signs Blood pressure 107/70, pulse 90, temperature 98.3 F (36.8 C), temperature source Oral, resp. rate 17, SpO2 98 %. Gen: no distress, normal appearing HENT: oral mucosa pink and moist, NCAT Eyes:    Extraocular Movements: Extraocular movements intact.    Pupils: Pupils are equal, round, and reactive to light.    Comments: Left eye lashes with crusted drainage. Right periorbital area with mild irritation.   Cardiovascular:    Rate and Rhythm: Normal rate and regular rhythm.    Heart sounds: No murmur heard.   No gallop. Pulmonary:    Effort: Pulmonary effort is normal. No respiratory distress.    Breath sounds: No wheezing. Abdominal:    General: Bowel sounds are normal. There is no distension.    Tenderness: There is no abdominal  tenderness. Musculoskeletal:    Cervical back: Normal range of motion and neck supple.    Comments: Significant bilateral pes cavus deformities  Skin:    General: Skin is warm and dry. Neurological:    Mental Status: She is alert and oriented to person, place, and time.    Sensory: No sensory deficit.    Comments: Alert and oriented x 3. Normal insight and awareness. Intact Memory. Normal language and speech. Cranial nerve exam unremarkable. UE tremor L>R. Strength 5/5 RUE and 4/5 LUE (shoulder limits). LE: 3-4/5 prox to distal.   Psychiatric:    Comments: Pt very flat but cooperative.     Assessment/Plan: 1. Functional deficits which require 3+ hours per day of interdisciplinary therapy in a comprehensive inpatient rehab setting. Physiatrist is providing close team supervision and 24 hour management of active medical problems listed below. Physiatrist and rehab team continue to assess barriers to discharge/monitor patient progress toward functional and medical goals  Care Tool:  Bathing              Bathing assist Assist Level: Moderate Assistance - Patient 50 - 74%     Upper Body Dressing/Undressing Upper  body dressing   What is the patient wearing?: Bra, Pull over shirt    Upper body assist Assist Level: Minimal Assistance - Patient > 75%    Lower Body Dressing/Undressing Lower body dressing      What is the patient wearing?: Underwear/pull up, Pants     Lower body assist Assist for lower body dressing: Maximal Assistance - Patient 25 - 49%     Toileting Toileting    Toileting assist Assist for toileting: Maximal Assistance - Patient 25 - 49%     Transfers Chair/bed transfer  Transfers assist     Chair/bed transfer assist level: Maximal Assistance - Patient 25 - 49%     Locomotion Ambulation   Ambulation assist   Ambulation activity did not occur: Safety/medical concerns          Walk 10 feet activity   Assist  Walk 10 feet activity did  not occur: Safety/medical concerns        Walk 50 feet activity   Assist Walk 50 feet with 2 turns activity did not occur: Safety/medical concerns         Walk 150 feet activity   Assist Walk 150 feet activity did not occur: Safety/medical concerns         Walk 10 feet on uneven surface  activity   Assist Walk 10 feet on uneven surfaces activity did not occur: Safety/medical concerns         Wheelchair     Assist        Wheelchair assist level: Moderate Assistance - Patient 50 - 74% Max wheelchair distance: 50    Wheelchair 50 feet with 2 turns activity    Assist        Assist Level: Moderate Assistance - Patient 50 - 74%   Wheelchair 150 feet activity     Assist      Assist Level: Maximal Assistance - Patient 25 - 49%   Blood pressure 107/70, pulse 90, temperature 98.3 F (36.8 C), temperature source Oral, resp. rate 17, SpO2 98 %.    Medical Problem List and Plan: 1.  Functional and mobility deficits secondary to FTT, severe depression, medication side effects?             -patient may shower             -ELOS/Goals: 10-14 days, supervision to min assist with PT and OT  -Continue CIR 2.  Antithrombotics: -DVT/anticoagulation:  Pharmaceutical: Lovenox             -antiplatelet therapy: N/A 3. Pain Management: Tylenol prn.  4. Mood: LCSW to follow for evaluation and support.              -see #8             -antipsychotic agents: N/A 5. Neuropsych: This patient  may be intermittently capable of making decisions on her own behalf. 6. Skin/Wound Care: Routine pressure relief measures.  7. Fluids/Electrolytes/Nutrition: Monitor I/O. Check Lytes in am. 8. Severe recurrent depression: Effexor tapers off tomorrow. --Continue Prozac (decreased to 10 mg starting 7/30) with trazodone for insomnia. -monitor sleep pattern, interactions with staff/family. Seems to be fairly up beat at present and wants to get stronger/home 9. Anorexia: On  megace for supplement with intake improving. 10. Glaucoma: Managed with lumigan and Azopt at nights. We do not carry Lumigan here- we have Latanoprost.  11. Frequency/incontinence: New since fall per reports.              -  may use purewick initially, but I told pt we're going to move away from it --Will monitor voiding with PVR checks. --Toilet every 3-4 hours. 12. Vitamin B 12 deficiency: Will add oral supplement also.  13. Notes confusion: advised minimization of medications that can affect cognition, especially in elderly. Check UA/UC given concurrent urinary retention.  14. Pain with urinary catheterization: ordered lidocaine jelly 2%.   LOS: 1 days A FACE TO FACE EVALUATION WAS PERFORMED  Clint Bolder P Cataleya Cristina 08/03/2020, 3:09 PM

## 2020-08-04 NOTE — Progress Notes (Signed)
Inpatient Rehabilitation  Patient information reviewed and entered into eRehab system by Analysia Dungee M. Airen Stiehl, M.A., CCC/SLP, PPS Coordinator.  Information including medical coding, functional ability and quality indicators will be reviewed and updated through discharge.    

## 2020-08-05 DIAGNOSIS — R5381 Other malaise: Secondary | ICD-10-CM | POA: Diagnosis not present

## 2020-08-05 DIAGNOSIS — M810 Age-related osteoporosis without current pathological fracture: Secondary | ICD-10-CM | POA: Insufficient documentation

## 2020-08-05 DIAGNOSIS — R339 Retention of urine, unspecified: Secondary | ICD-10-CM | POA: Clinically undetermined

## 2020-08-05 DIAGNOSIS — R627 Adult failure to thrive: Secondary | ICD-10-CM | POA: Diagnosis present

## 2020-08-05 LAB — CBC
HCT: 36.7 % (ref 36.0–46.0)
Hemoglobin: 11.9 g/dL — ABNORMAL LOW (ref 12.0–15.0)
MCH: 28.8 pg (ref 26.0–34.0)
MCHC: 32.4 g/dL (ref 30.0–36.0)
MCV: 88.9 fL (ref 80.0–100.0)
Platelets: 391 10*3/uL (ref 150–400)
RBC: 4.13 MIL/uL (ref 3.87–5.11)
RDW: 14.8 % (ref 11.5–15.5)
WBC: 6.9 10*3/uL (ref 4.0–10.5)
nRBC: 0 % (ref 0.0–0.2)

## 2020-08-05 LAB — BASIC METABOLIC PANEL
Anion gap: 10 (ref 5–15)
BUN: 28 mg/dL — ABNORMAL HIGH (ref 8–23)
CO2: 20 mmol/L — ABNORMAL LOW (ref 22–32)
Calcium: 9.2 mg/dL (ref 8.9–10.3)
Chloride: 108 mmol/L (ref 98–111)
Creatinine, Ser: 0.87 mg/dL (ref 0.44–1.00)
GFR, Estimated: >60 mL/min (ref >60)
Glucose, Bld: 92 mg/dL (ref 70–99)
Potassium: 3.9 mmol/L (ref 3.5–5.1)
Sodium: 138 mmol/L (ref 135–145)

## 2020-08-05 NOTE — Progress Notes (Signed)
Referred pt to On call for tachycardia HR 134-136 bpm, BP also low at 80s/60s at 2000H, Improved after HOB lowered, pt is asymptomatic. EKG (NSR) done and reviewed by NP. HR 140 again but quickly came back to normal. Awaiting hospitalist to see pt.

## 2020-08-05 NOTE — Progress Notes (Signed)
Physical Therapy Session Note  Patient Details  Name: [name] MRN: [id number] Date of Birth: 09/24/1935  Today's Date: 08/05/2020 PT Individual Time: 1110-1200 PT Individual Time Calculation (min): 50 min   Short Term Goals: Week 1:  PT Short Term Goal 1 (Week 1): Pt will transfer to [facility] with mod assist PT Short Term Goal 2 (Week 1): Pt will perform bed mobility with min assist PT Short Term Goal 3 (Week 1): Pt will tolerate standing up to 2 minutes PT Short Term Goal 4 (Week 1): Pt will ambulate 60ft with min assist  Skilled Therapeutic Interventions/Progress Updates:    Pt seated in w/c on arrival and agreeable to therapy. No complaint of pain. Pt presents with flat affect throughout session. Pt propelled w/c with BUE x 50 ft with max verbal cues. Pt had difficulty navigating chair and often veered to the left. Sit to stand with RW, mod A fading to min A at times. Pt directed in gait with RW x 25 ft. Pt demoed very short step length with hip and knee flexion. Pt frequently removed hand from RW to hold on to handrail. Pt then directed in pre- gait activities with BUE support: weight shifting in modified tandem, stepping to target. Pt then performed 2 laps of 6 ft stepping on targets to promote normalized step length. Gait with RW x 50 ft with CGA-min A to recover from posterior LOB. Pt directed to take long steps as if on targets and was able to maintain for 3-4 steps then returned to short stride length. Pt was able to maintain normalized gait pattern when counting steps out loud. Pt returned to w/c after session and was left with all needs in reach and alarm active.  Therapy Documentation Precautions:  Precautions Precautions: Fall Precaution Comments: heavy posterior lean Restrictions Weight Bearing Restrictions: No     Therapy/Group: Individual Therapy  Juluis Rainier 08/05/2020, 12:25 PM

## 2020-08-05 NOTE — IPOC Note (Signed)
Overall Plan of Care [facility]) Patient Details Name: Sheila Andrade MRN: [id number] DOB: 04/02/1935  Admitting Diagnosis: Debility  Hospital Problems: Principal Problem:   Debility Active Problems:   Pressure injury of skin   Failure to thrive in adult   Urinary retention     Functional Problem List: Nursing Behavior, Endurance, Medication Management, Nutrition, Safety  PT Balance, Behavior, Endurance, Motor, Nutrition, Pain, Sensory, Safety  OT Balance, Behavior, Perception, Safety, Cognition, Skin Integrity, Endurance, Vision, Motor, Nutrition, Pain  SLP    TR         Basic ADL's: OT Grooming, Bathing, Dressing, Toileting     Advanced  ADL's: OT       Transfers: PT Bed Mobility, Bed to Chair, Car, [employer], Civil Service fast streamer, Research scientist (life sciences): PT Ambulation, Psychologist, prison and probation services     Additional Impairments: OT Fuctional Use of Upper Extremity  SLP        TR      Anticipated Outcomes Item Anticipated Outcome  Self Feeding    Swallowing      Basic self-care  min assist  Toileting  min assist   Bathroom Transfers CGA  Bowel/Bladder  n/a  Transfers  CGA with LRAD  Locomotion  min-CGA gait with LRAD for house hold distances.  Communication     Cognition     Pain  n/a  Safety/Judgment  min assist and no falls   Therapy Plan: PT Intensity: Minimum of 1-2 x/day ,45 to 90 minutes PT Frequency: 5 out of 7 days PT Duration Estimated Length of Stay: 14-17days OT Intensity: Minimum of 1-2 x/day, 45 to 90 minutes OT Frequency: 5 out of 7 days OT Duration/Estimated Length of Stay: 2 weeks     Due to the current state of emergency, patients may not be receiving their 3-hours of Medicare-mandated therapy.   Team Interventions: Nursing Interventions Patient/Family Education, Disease Management/Prevention, Medication Management, Discharge Planning, Psychosocial Support  PT interventions Ambulation/gait training, Community reintegration,  DME/adaptive equipment instruction, Neuromuscular re-education, Psychosocial support, UE/LE Strength taining/ROM, Wheelchair propulsion/positioning, Museum/gallery curator, Warden/ranger, Cognitive remediation/compensation, Discharge planning, Disease management/prevention, Functional electrical stimulation, Functional mobility training, Patient/family education, Splinting/orthotics, Pain management, Therapeutic Exercise, Therapeutic Activities, Skin care/wound management, UE/LE Coordination activities, Visual/perceptual remediation/compensation  OT Interventions Balance/vestibular training, Discharge planning, Pain management, Self Care/advanced ADL retraining, Therapeutic Activities, UE/LE Coordination activities, Cognitive remediation/compensation, Disease mangement/prevention, Functional mobility training, Patient/family education, Skin care/wound managment, Therapeutic Exercise, Visual/perceptual remediation/compensation, DME/adaptive equipment instruction, Neuromuscular re-education, Psychosocial support, UE/LE Strength taining/ROM, Wheelchair propulsion/positioning  SLP Interventions    TR Interventions    SW/CM Interventions Discharge Planning, Psychosocial Support, Patient/Family Education   Barriers to Discharge MD  Medical stability, Home enviroment access/loayout, Wound care, Weight, Medication compliance, and Behavior  Nursing Decreased caregiver support, Home environment access/layout, Lack of/limited family support, Weight, Medication compliance, Behavior, Nutrition means Discharging to daughter's home. 2 level home with 3 steps to enter and no rails available. Will be able to live on main level with bedroom/bathroom. Daughter will be able to provide 24/7 supervision at discharge.  PT Inaccessible home environment, Home environment access/layout    OT Behavior, Other (comments) (history of suicidal ideation and severe depression may inhibit participation and progress)    SLP       SW Insurance for SNF coverage     Team Discharge Planning: Destination: PT-Home ,OT- Home , SLP-  Projected Follow-up: PT-Home health PT, OT-  24 hour supervision/assistance, Home health OT, SLP-  Projected Equipment Needs: PT-To be determined,  OT- To be determined, SLP-  Equipment Details: PT- , OT-  Patient/family involved in discharge planning: PT- Patient, Family member/caregiver,  OT-Patient, Family member/caregiver, SLP-   MD ELOS: 14-17 days Medical Rehab Prognosis:  Fair Assessment: Pt is an 85 yr old female with glaucoma on L eye and anorexia and failure to thrive admitted with debility and inability to mobilize since a fall-  Goals min A to CGA.     See Team Conference Notes for weekly updates to the plan of care

## 2020-08-05 NOTE — Progress Notes (Signed)
Inpatient Rehabilitation Center Individual Statement of Services  Patient Name:  Sheila Andrade  Date:  08/05/2020  Welcome to the Inpatient Rehabilitation Center.  Our goal is to provide you with an individualized program based on your diagnosis and situation, designed to meet your specific needs.  With this comprehensive rehabilitation program, you will be expected to participate in at least 3 hours of rehabilitation therapies Monday-Friday, with modified therapy programming on the weekends.  Your rehabilitation program will include the following services:  Physical Therapy (PT), Occupational Therapy (OT), 24 hour per day rehabilitation nursing, Therapeutic Recreaction (TR), Psychology, Care Coordinator, Rehabilitation Medicine, Nutrition Services, and Pharmacy Services  Weekly team conferences will be held on Tuesday to discuss your progress.  Your Inpatient Rehabilitation Care Coordinator will talk with you frequently to get your input and to update you on team discussions.  Team conferences with you and your family in attendance may also be held.  Expected length of stay: 14-17 days  Overall anticipated outcome: CGA-min level  Depending on your progress and recovery, your program may change. Your Inpatient Rehabilitation Care Coordinator will coordinate services and will keep you informed of any changes. Your Inpatient Rehabilitation Care Coordinator's name and contact numbers are listed  below.  The following services may also be recommended but are not provided by the Inpatient Rehabilitation Center:   Home Health Rehabiltiation Services Outpatient Rehabilitation Services    Arrangements will be made to provide these services after discharge if needed.  Arrangements include referral to agencies that provide these services.  Your insurance has been verified to be:  [employer] Your primary doctor is:  Idania Desouza  Pertinent information will be shared with your doctor and your  insurance company.  Inpatient Rehabilitation Care Coordinator:  Dossie Der, [place] [phone] or Luna Glasgow  Information discussed with and copy given to patient by: Lucy Chris, 08/05/2020, 12:06 PM

## 2020-08-05 NOTE — Progress Notes (Signed)
Physical Therapy Session Note  Patient Details  Name: Sheila Andrade MRN: [id number] Date of Birth: 1935/11/11  Today's Date: 08/05/2020 PT Individual Time: 1500-1525 PT Individual Time Calculation (min): 25 min   Short Term Goals: Week 1:  PT Short Term Goal 1 (Week 1): Pt will transfer to [facility] with mod assist PT Short Term Goal 2 (Week 1): Pt will perform bed mobility with min assist PT Short Term Goal 3 (Week 1): Pt will tolerate standing up to 2 minutes PT Short Term Goal 4 (Week 1): Pt will ambulate 31ft with min assist  Skilled Therapeutic Interventions/Progress Updates:   Received pt sitting EOB, pt agreeable to PT treatment, and denied any pain during session. Session with emphasis on functional mobility/transfers, gait training, and improved activity tolerance. Pt transferred bed<>WC with RW and min A from elevated bed and transported to dayroom in [facility] total A for time management purposes. Sit<>stands with RW and mod A and ambulated 90ft x 1 and 66ft x 2 trials with RW and min A. Pt demonstrates narrow BOS, decreased step/stride length, and flexed trunk and required cues to increase step length. Pt transported back to room in [facility] total A and ambulated 29ft with RW and CGA to bed and doffed shoes sitting EOB with supervision. Sit<>supine with supervision. Concluded session with pt sidelying in bed, needs within reach, and bed alarm on.   Therapy Documentation Precautions:  Precautions Precautions: Fall Precaution Comments: heavy posterior lean Restrictions Weight Bearing Restrictions: No  Therapy/Group: Individual Therapy Martin Majestic PT, DPT   08/05/2020, 7:37 AM

## 2020-08-05 NOTE — Progress Notes (Signed)
Occupational Therapy Session Note  Patient Details  Name: Sheila Andrade MRN: [id number] Date of Birth: Sep 03, 1935  Today's Date: 08/05/2020 OT Individual Time: 6195-0932 and 1315-1410  OT Individual Time Calculation (min): 54 min and 55 min   Short Term Goals: Week 1:  OT Short Term Goal 1 (Week 1): Pt will complete toilet transfer with min assist OT Short Term Goal 2 (Week 1): Pt will stand at [facility] with CGA and unilateral hand release for 15 seconds in preperation for self care OT Short Term Goal 3 (Week 1): Pt will dress LB with mod assist OT Short Term Goal 4 (Week 1): Pt will shower UB/LB using AE and AD as needed with min assist.  Skilled Therapeutic Interventions/Progress Updates:    Session 1: Pt greeted at time of session semireclined in bed resting agreeable to OT session, no pain. Wanting to change clothes, wanting to thread pants bed level and did so with Supervision in supine bringing feet up to self. Supine > sit Min A for scooting and changed shirts with Supervision. Sit <> stand Mod A at RW with significant posterior bias in standing, able to correct minimally with multimodal cues. Donning pants over hips Max A. Stand pivot bed > wheelchair Max A d/t posterior bias and set up at sink for oral hygiene seated with Supervision. Declined toileting at this time. Transported room <> day room for time via wheelchair and focus on standing balance and body mechanics with sit <> stands at RW encouraging carryover and sequencing from the pt. Sit > stands originally Mod A fading to CGA with practice, able to stand 1-2 minute intervals each trial with lateral weight shifting and single leg marches in standing. Transported back to room and insisting on laying down to rest prior to next session, stand pivot wheelchair > bed Min/CGA with RW. Alarm on call bell inr each.   Session 2: Pt greeted at time of OT session up in wheelchair no pain, agreeable to OT session early. Pt focused on making sure she  orders dinner, made aware that dietary staff come by to take orders but pt insistent. Called # on menu and had pt assist in ordering dinner for tonight. Pt satisfied, transported via wheelchair to gym and focused on standing balance while completing unilateral task in standing for card matching game, needing occasional cues for matching right suit (pt also stating she is not a card player) while standing for 5 minutes. Wheelchair push ups 2x5 with demonstration and cues for mechanics. Focused on anterior weight shifting with reaching gradually further towards toes, 3 reps to improve comfort with forward leaning and decrease fear of falling. Back in room, toilet transfer wheelchair <> toilet with Min A but did need Mod A to power up from commode, placed BSC over top of toilet afterwards for armrests to push up. Min/Mod A for clothing management. Pt insistent on laying down prior to next therapy session, stnd pivot using bed rail Min A. Sit > supine Min A, alarm on call bell in reach.   Therapy Documentation Precautions:  Precautions Precautions: Fall Precaution Comments: heavy posterior lean Restrictions Weight Bearing Restrictions: No     Therapy/Group: Individual Therapy  Erasmo Score 08/05/2020, 8:49 AM

## 2020-08-05 NOTE — Progress Notes (Signed)
Received a call from Ms. Lesko nurse, reporting she was tachycardia. Apical pulse checked , she reports her heart rate 134- 136. Spoke with Press photographer, stat EKG ordered. Awaiting results.

## 2020-08-05 NOTE — Progress Notes (Signed)
Call Placed to Hospitalist: Spoke with Dr. Loney Loh, she was not covering consult. Awaiting a return call.

## 2020-08-05 NOTE — Progress Notes (Signed)
Inpatient Rehabilitation Care Coordinator Assessment and Plan Patient Details  Name: Sheila Andrade MRN: [id number] Date of Birth: 23-Mar-1935  Today's Date: 08/05/2020  Hospital Problems: Active Problems:   Debility   Pressure injury of skin  Past Medical History:  Past Medical History:  Diagnosis Date   Hot flashes 06/06/2020   Hypertension    Past Surgical History: History reviewed. No pertinent surgical history. Social History:  reports that she has never smoked. She has never used smokeless tobacco. She reports that she does not use drugs. No history on file for alcohol use.  Family / Support Systems Marital Status: Divorced Patient Roles: Parent Children: Mary-daughter [phone]-cell  Liz-daughter [phone]-cell Other Supports: [name]-daughter in-law [phone] Anticipated Caregiver: Family Ability/Limitations of Caregiver: Children and family members work so will need to come up with a plan for DC Caregiver Availability: Other (Comment) (Will need to come up with a plan for 24/7 care) Family Dynamics: Close knit family who have been providing care since discharging from rehab in 03/2020. Pt has had other health issues and now is here. Family is very supportive and involved and will assist with her care  Social History Preferred language: English Religion: Christian Cultural Background: No issues Education: PhD in education Read: Yes Write: Yes Employment Status: Retired Marine scientist Issues: NO issues Guardian/Conservator: None-according to MD pt a times is capable of making her own decisions, will include children if any decisions need tpo be made while here   Abuse/Neglect Abuse/Neglect Assessment Can Be Completed: Yes Physical Abuse: Denies Verbal Abuse: Denies Sexual Abuse: Denies Exploitation of patient/patient's resources: Denies Self-Neglect: Denies  Emotional Status Pt's affect, behavior and adjustment status: Pt is tired from am  therapies, but did answer my questions. She wants to be able to take care of herself, sh eis not one to rley upon others and does not like it. She hopes to get back to her own home but will settle to go to daughter's then hopefully back to her home. Recent Psychosocial Issues: other health issues-started in 1/202 pelvic fracture and then COVID. Psychiatric History: Currently depressed due to health issues, lose of control and independence. Has had psych eval upon admission and currently following while here. Will need OP follow up for this. Medications being changed and hopefully will manage her depression better Substance Abuse History: No issues  Patient / Family Perceptions, Expectations & Goals Pt/Family understanding of illness & functional limitations: Pt and daughter can explain her health issues and the tme line, both hope pt does well here and can be mobile again. Both speak with the MD and feel they have a good understanding of her treatment plan going forward. Premorbid pt/family roles/activities: Mom, grandmother, retiree, home owner, church member, etc Anticipated changes in roles/activities/participation: resume Pt/family expectations/goals: Pt states: " I want to be able to care for myself, we will see though."  Daughter states: " We hope she can improve while here and get better."  [employer]: Other (Comment) (was in rehab after pelvic fracture) Premorbid Home Care/DME Agencies: Other (Comment) ([facility] followed after reahb stay) Transportation available at discharge: Family members Resource referrals recommended: Other (Comment) (psychiatry following)  Discharge Planning Living Arrangements: Children, Other relatives Support Systems: Children, Other relatives, Friends/neighbors, Church/faith community Type of Residence: Private residence Insurance Resources: Media planner (specify) Engineer, materials) Financial Resources: Restaurant manager, fast food  Screen Referred: No Living Expenses: Lives with family Money Management: Family Does the patient have any problems obtaining your medications?: No Home Management: Family members  since 03/2020 Patient/Family Preliminary Plans: Return back to daughter's home, will make aware pt will probably need 24/7 care at discharge from rehab and due to coming here she is not able to go to a SNF facility with her managed care insurance. Care Coordinator Barriers to Discharge: Insurance for SNF coverage Care Coordinator Anticipated Follow Up Needs: HH/OP, Other (comment)  Clinical Impression Pleasant yet tired female who is willing to work in therapies to regain her independence. She has had multiple health issues since 01/2020 and this has caused her to be depressed and questioning her will to live. She has been staying with daughter's since discharging from rehab in 03/2020 and has continued to have health issues which has derailed her recover from her pelvic fracture. She would like to get back to her home in [place] but is willing to go to daughter's home from here. She will need OP psych follow up and home health. Her family is very involved and will need to come up with a 24/7 plan for her. Will work on discharge needs and provide support to pt.  Lucy Chris 08/05/2020, 12:01 PM

## 2020-08-05 NOTE — Progress Notes (Signed)
PROGRESS NOTE   Subjective/Complaints:  Pt reports spirits good, but denies pain; slept well; LBM last night- Are >75% of breakfast.   Was cathed for 400cc early this AM However did void later this AM and also voided last night.    ROS:  Pt denies SOB, abd pain, CP, N/V/C/D, and vision changes    Objective:   No results found. Recent Labs    08/03/20 0534 08/05/20 0515  WBC 8.3 6.9  HGB 11.4* 11.9*  HCT 35.0* 36.7  PLT 391 391   Recent Labs    08/03/20 0534 08/05/20 0515  NA 134* 138  K 4.2 3.9  CL 106 108  CO2 21* 20*  GLUCOSE 96 92  BUN 31* 28*  CREATININE 0.73 0.87  CALCIUM 9.3 9.2    Intake/Output Summary (Last 24 hours) at 08/05/2020 1301 Last data filed at 08/05/2020 2458 Gross per 24 hour  Intake 440 ml  Output 400 ml  Net 40 ml     Pressure Injury 08/02/20 Buttocks Left Stage 1 -  Intact skin with non-blanchable redness of a localized area usually over a bony prominence. (Active)  08/02/20 1845  Location: Buttocks  Location Orientation: Left  Staging: Stage 1 -  Intact skin with non-blanchable redness of a localized area usually over a bony prominence.  Wound Description (Comments):   Present on Admission: Yes    Physical Exam: Vital Signs Blood pressure 139/77, pulse 74, temperature 98 F (36.7 C), temperature source Oral, resp. rate 18, height 5\' 7"  (1.702 m), weight 53.5 kg, SpO2 100 %.    General: awake, alert, appropriate, laying supine in bed; NAD HENT: conjugate gaze;  L eye injected- lids slightly crusted (has glaucoma in that eye) denies pain  CV: regular rate; no JVD Pulmonary: CTA B/L; no W/R/R- good air movement GI: soft, NT, ND, (+)BS, normoactive Psychiatric: appropriate; polite but flat Neurological: alert  Musculoskeletal:    Cervical back: Normal range of motion and neck supple.    Comments: Significant bilateral pes cavus deformities  Skin:    General: Skin is  warm and dry. Neurological:    Mental Status: She is alert and oriented to person, place, and time.    Sensory: No sensory deficit.    Comments: Alert and oriented x 3. Normal insight and awareness. Intact Memory. Normal language and speech. Cranial nerve exam unremarkable. UE tremor L>R. Strength 5/5 RUE and 4/5 LUE (shoulder limits). LE: 3-4/5 prox to distal.     Assessment/Plan: 1. Functional deficits which require 3+ hours per day of interdisciplinary therapy in a comprehensive inpatient rehab setting. Physiatrist is providing close team supervision and 24 hour management of active medical problems listed below. Physiatrist and rehab team continue to assess barriers to discharge/monitor patient progress toward functional and medical goals  Care Tool:  Bathing              Bathing assist Assist Level: Moderate Assistance - Patient 50 - 74%     Upper Body Dressing/Undressing Upper body dressing   What is the patient wearing?: Pull over shirt    Upper body assist Assist Level: Contact Guard/Touching assist    Lower Body Dressing/Undressing Lower  body dressing      What is the patient wearing?: Pants     Lower body assist Assist for lower body dressing: Moderate Assistance - Patient 50 - 74%     Toileting Toileting    Toileting assist Assist for toileting: Maximal Assistance - Patient 25 - 49%     Transfers Chair/bed transfer  Transfers assist     Chair/bed transfer assist level: Moderate Assistance - Patient 50 - 74%     Locomotion Ambulation   Ambulation assist   Ambulation activity did not occur: Safety/medical concerns  Assist level: Minimal Assistance - Patient > 75% Assistive device: Walker-rolling Max distance: 25 ft   Walk 10 feet activity   Assist  Walk 10 feet activity did not occur: Safety/medical concerns  Assist level: Minimal Assistance - Patient > 75% Assistive device: Walker-rolling   Walk 50 feet activity   Assist Walk 50  feet with 2 turns activity did not occur: Safety/medical concerns         Walk 150 feet activity   Assist Walk 150 feet activity did not occur: Safety/medical concerns         Walk 10 feet on uneven surface  activity   Assist Walk 10 feet on uneven surfaces activity did not occur: Safety/medical concerns         Wheelchair     Assist Will patient use wheelchair at discharge?:  (yes per noted PT LTG's)      Wheelchair assist level: Moderate Assistance - Patient 50 - 74% Max wheelchair distance: 50    Wheelchair 50 feet with 2 turns activity    Assist        Assist Level: Moderate Assistance - Patient 50 - 74%   Wheelchair 150 feet activity     Assist      Assist Level: Maximal Assistance - Patient 25 - 49%   Blood pressure 139/77, pulse 74, temperature 98 F (36.7 C), temperature source Oral, resp. rate 18, height 5\' 7"  (1.702 m), weight 53.5 kg, SpO2 100 %.    Medical Problem List and Plan: 1.  Functional and mobility deficits secondary to FTT, severe depression, medication side effects?             -patient may shower             -ELOS/Goals: 10-14 days, supervision to min assist with PT and OT  -Con't CIR/PT and OT 2.  Antithrombotics: -DVT/anticoagulation:  Pharmaceutical: Lovenox             -antiplatelet therapy: N/A 3. Pain Management: Tylenol prn.  4. Mood: LCSW to follow for evaluation and support.              -see #8             -antipsychotic agents: N/A 5. Neuropsych: This patient  may be intermittently capable of making decisions on her own behalf. 6. Skin/Wound Care: Routine pressure relief measures.  7. Fluids/Electrolytes/Nutrition: Monitor I/O. Check Lytes in am. 8. Severe recurrent depression: Effexor tapers off tomorrow. --Continue Prozac (decreased to 10 mg starting 7/30) with trazodone for insomnia. -monitor sleep pattern, interactions with staff/family. Seems to be fairly up beat at present and wants to get  stronger/home 9. Anorexia: On megace for supplement with intake improving.  8/1- ate >75% of breakfast- con't Megace.  10. Glaucoma: Managed with lumigan and Azopt at nights. We do not carry Lumigan here- we have Latanoprost.  11. Frequency/incontinence and urinary retention: New since fall per  reports.              -may use purewick initially, but I told pt we're going to move away from it --Will monitor voiding with PVR checks. --Toilet every 3-4 hours. 8/1- is requiring in/out caths last 2 days 1x/day so far- if doesn't improve in next 1-2 days, suggest starting Flomax as long as no issues with glaucoma? 12. Vitamin B 12 deficiency: Will add oral supplement also.  13. Notes confusion: advised minimization of medications that can affect cognition, especially in elderly. Check UA/UC given concurrent urinary retention.  14. Pain with urinary catheterization: ordered lidocaine jelly 2%.   LOS: 3 days A FACE TO FACE EVALUATION WAS PERFORMED  Sheila Andrade 08/05/2020, 1:01 PM

## 2020-08-05 NOTE — Consult Note (Addendum)
Medical Consultation   Sheila Andrade  YSA:[id number]  DOB: [birth year]  DOA: 08/02/2020  PCP: Dalphine Handing, MD    Requesting physician: Jacalyn Lefevre, NP  Reason for consultation: Tachycardia   History of Present Illness: Sheila Andrade is an 85 y.o. female who was now in rehab who was previously admitted 3 weeks ago for failure to thrive. She had been improving with getting her appetite back but not eating or drinking as much as usual.  Patient has a history of near syncope multiple times and it sometimes has had a rapid heart rate.  In care everywhere is noted that she has a history of paroxysmal SVT though neither she nor her daughters have any recollection of her having this. Tonight she was in her usual state of health and getting vital signs and they were noted to have tachycardia at 140.  During that time her blood pressure was low at 80/60 she was asked to lie down her blood pressure improved.  She was in this rhythm which was described as regular by the nurse for approximately 10 to 15 minutes and spontaneously converted.  An EKG was done but at that time she was back in sinus rhythm.  She has had a normal TSH in the last few weeks and a normal echo in 6 of 2022.  Every time she was admitted with near syncope she had a work-up that included troponins which have all been negative.  Review of Systems:   As per HPI otherwise 10 point review of systems negative.    Review of Systems Past Medical History: Past Medical History:  Diagnosis Date   Hot flashes 06/06/2020   Hypertension     Past Surgical History: History reviewed. No pertinent surgical history.   Allergies:  No Known Allergies   Social History:  reports that she has never smoked. She has never used smokeless tobacco. She reports that she does not use drugs. No history on file for alcohol use.   Family History: Family History  Problem Relation Age of Onset   Hypertension Other         Physical Exam: Vitals:   08/05/20 2102 08/05/20 2116 08/05/20 2316 08/05/20 2347  BP: 102/77   98/63  Pulse: (!) 140 80 (!) 136 80  Resp:    16  Temp:    99.3 F (37.4 C)  TempSrc:    Oral  SpO2: 98%   96%  Weight:      Height:        Constitutional: Alert and awake, oriented x3, not in any acute distress. Eyes: PERLA, EOMI, irises appear normal, anicteric sclera,  ENMT: external ears and nose appear normal, normal hearing            Lips appears normal, oropharynx mucosa, tongue, posterior pharynx appear normal  Neck: neck appears normal, no masses, normal ROM, no thyromegaly, no JVD  CVS: S1-S2 clear, no murmur rubs or gallops, no LE edema, normal pedal pulses  Respiratory:  clear to auscultation bilaterally, no wheezing, rales or rhonchi. Respiratory effort normal. No accessory muscle use.  Abdomen: soft nontender, nondistended, normal bowel sounds, no hepatosplenomegaly, no hernias  Musculoskeletal: : no cyanosis, clubbing or edema noted bilaterally Neuro: Grossly normal Psych: judgement and insight appear normal, stable mood and affect, mental status Skin: no rashes or lesions or ulcers, no induration or nodules    Data reviewed:  I have personally  reviewed following labs and imaging studies Labs:  CBC: Recent Labs  Lab 08/03/20 0534 08/05/20 0515  WBC 8.3 6.9  NEUTROABS 4.7  --   HGB 11.4* 11.9*  HCT 35.0* 36.7  MCV 88.6 88.9  PLT 391 391    Basic Metabolic Panel: Recent Labs  Lab 08/03/20 0534 08/05/20 0515  NA 134* 138  K 4.2 3.9  CL 106 108  CO2 21* 20*  GLUCOSE 96 92  BUN 31* 28*  CREATININE 0.73 0.87  CALCIUM 9.3 9.2   GFR Estimated Creatinine Clearance: 42.1 mL/min (by C-G formula based on SCr of 0.87 mg/dL). Liver Function Tests: Recent Labs  Lab 08/03/20 0534  AST 16  ALT 16  ALKPHOS 45  BILITOT 0.5  PROT 5.7*  ALBUMIN 2.9*    Urinalysis    Component Value Date/Time   COLORURINE YELLOW 07/20/2020 1759   APPEARANCEUR  HAZY (A) 07/20/2020 1759   LABSPEC 1.013 07/20/2020 1759   LABSPEC 1.020 05/27/2020 1541   PHURINE 7.0 07/20/2020 1759   GLUCOSEU NEGATIVE 07/20/2020 1759   HGBUR NEGATIVE 07/20/2020 1759   BILIRUBINUR NEGATIVE 07/20/2020 1759   BILIRUBINUR negative 05/27/2020 1541   KETONESUR NEGATIVE 07/20/2020 1759   PROTEINUR NEGATIVE 07/20/2020 1759   NITRITE NEGATIVE 07/20/2020 1759   LEUKOCYTESUR NEGATIVE 07/20/2020 1759    Inpatient Medications:   Scheduled Meds:  brinzolamide  1 drop Left Eye BID   cholecalciferol  1,000 Units Oral Daily   enoxaparin (LOVENOX) injection  40 mg Subcutaneous Q24H   feeding supplement  237 mL Oral TID BM   FLUoxetine  10 mg Oral Daily   latanoprost  1 drop Left Eye QHS   megestrol  400 mg Oral Daily   multivitamin with minerals  1 tablet Oral Daily   naphazoline-glycerin  2 drop Left Eye TID AC & HS   simvastatin  20 mg Oral Daily   traZODone  25 mg Oral QHS   Continuous Infusions:   Radiological Exams on Admission: No results found.  Impression/Recommendations Principal Problem:   Debility Active Problems:   Pressure injury of skin   Failure to thrive in adult   Urinary retention   Hyperlipidemia   Hypertension   Paroxysmal SVT (supraventricular tachycardia) (HCC)   Primary open angle glaucoma New onset tachycardia Given that this was brief, regular and with this history suspect SVT.  Her blood pressure seems to drop during these episodes and suspect she needs the atrial kick to not have this happen. Should this become repetitive would consider super low-dose beta-blocker if needed.  We will hold on this for now Increase vital signs every 4 hours if becomes repetitive try to get EKG during the time to prove what rhythm she is in.  Hypertension Holding all antihypertensives for now was on ARB previously  Hyperlipidemia Continue Zocor  Glaucoma Continue Xalatan and Azopt  Failure to thrive Continue Megace  Depression Continue Paxil  10 mg    Thank you for this consultation.  Our [facility] hospitalist team will follow the patient with you.   Time Spent: 43 minutes  Reva Bores M.D. [facility] 08/06/2020, 12:19 AM

## 2020-08-06 ENCOUNTER — Encounter (HOSPITAL_COMMUNITY): Payer: Self-pay | Admitting: Cardiology

## 2020-08-06 ENCOUNTER — Observation Stay (HOSPITAL_COMMUNITY)
Admission: AD | Admit: 2020-08-06 | Discharge: 2020-08-09 | Disposition: A | Discharge status: Home / Self Care | Payer: Medicare PPO | Attending: Internal Medicine | Admitting: Internal Medicine

## 2020-08-06 ENCOUNTER — Other Ambulatory Visit: Payer: Self-pay

## 2020-08-06 DIAGNOSIS — Z79899 Other long term (current) drug therapy: Secondary | ICD-10-CM | POA: Insufficient documentation

## 2020-08-06 DIAGNOSIS — I959 Hypotension, unspecified: Secondary | ICD-10-CM | POA: Diagnosis not present

## 2020-08-06 DIAGNOSIS — I471 Supraventricular tachycardia: Secondary | ICD-10-CM | POA: Diagnosis present

## 2020-08-06 DIAGNOSIS — I10 Essential (primary) hypertension: Secondary | ICD-10-CM | POA: Diagnosis not present

## 2020-08-06 DIAGNOSIS — R2681 Unsteadiness on feet: Secondary | ICD-10-CM | POA: Diagnosis not present

## 2020-08-06 DIAGNOSIS — R5381 Other malaise: Secondary | ICD-10-CM | POA: Diagnosis present

## 2020-08-06 DIAGNOSIS — R000 Tachycardia, unspecified: Secondary | ICD-10-CM | POA: Diagnosis present

## 2020-08-06 DIAGNOSIS — D649 Anemia, unspecified: Secondary | ICD-10-CM | POA: Diagnosis present

## 2020-08-06 DIAGNOSIS — E785 Hyperlipidemia, unspecified: Secondary | ICD-10-CM | POA: Diagnosis present

## 2020-08-06 DIAGNOSIS — R627 Adult failure to thrive: Secondary | ICD-10-CM | POA: Diagnosis present

## 2020-08-06 LAB — TROPONIN I (HIGH SENSITIVITY): Troponin I (High Sensitivity): 17 ng/L (ref <18)

## 2020-08-06 LAB — MAGNESIUM: Magnesium: 2.2 mg/dL (ref 1.7–2.4)

## 2020-08-06 LAB — POTASSIUM: Potassium: 4.2 mmol/L (ref 3.5–5.1)

## 2020-08-06 MED ORDER — ENOXAPARIN SODIUM 40 MG/0.4ML IJ SOSY
40.0000 mg | PREFILLED_SYRINGE | Freq: Every day | INTRAMUSCULAR | Status: DC
  Administered 2020-08-06 – 2020-08-09 (×4): 40 mg via SUBCUTANEOUS
  Filled 2020-08-06 (×4): qty 0.4

## 2020-08-06 MED ORDER — VENLAFAXINE HCL ER 37.5 MG PO CP24
37.5000 mg | ORAL_CAPSULE | Freq: Every day | ORAL | Status: DC
  Administered 2020-08-06: 37.5 mg via ORAL
  Filled 2020-08-06: qty 1

## 2020-08-06 MED ORDER — LATANOPROST 0.005 % OP SOLN
1.0000 [drp] | Freq: Every day | OPHTHALMIC | Status: DC
  Administered 2020-08-08: 1 [drp] via OPHTHALMIC
  Filled 2020-08-06: qty 2.5

## 2020-08-06 MED ORDER — SODIUM CHLORIDE 0.9 % IV SOLN: INTRAVENOUS | Status: AC

## 2020-08-06 MED ORDER — ENSURE ENLIVE PO LIQD
237.0000 mL | Freq: Three times a day (TID) | ORAL | Status: DC
  Administered 2020-08-06 – 2020-08-09 (×8): 237 mL via ORAL

## 2020-08-06 MED ORDER — ACETAMINOPHEN 650 MG RE SUPP: 650.0000 mg | Freq: Four times a day (QID) | RECTAL | Status: DC | PRN

## 2020-08-06 MED ORDER — ADULT MULTIVITAMIN W/MINERALS CH
1.0000 | ORAL_TABLET | Freq: Every day | ORAL | Status: DC
  Administered 2020-08-06 – 2020-08-09 (×4): 1 via ORAL
  Filled 2020-08-06 (×4): qty 1

## 2020-08-06 MED ORDER — MEGESTROL ACETATE 400 MG/10ML PO SUSP
400.0000 mg | Freq: Every day | ORAL | Status: DC
  Administered 2020-08-06 – 2020-08-09 (×4): 400 mg via ORAL
  Filled 2020-08-06 (×4): qty 10

## 2020-08-06 MED ORDER — ACETAMINOPHEN 325 MG PO TABS
650.0000 mg | ORAL_TABLET | Freq: Four times a day (QID) | ORAL | Status: DC | PRN
  Administered 2020-08-07: 650 mg via ORAL
  Filled 2020-08-06: qty 2

## 2020-08-06 MED ORDER — PANTOPRAZOLE SODIUM 40 MG PO TBEC
40.0000 mg | DELAYED_RELEASE_TABLET | Freq: Every day | ORAL | Status: DC
  Administered 2020-08-06 – 2020-08-09 (×4): 40 mg via ORAL
  Filled 2020-08-06 (×4): qty 1

## 2020-08-06 MED ORDER — NYSTATIN 100000 UNIT/GM EX POWD
1.0000 "application " | Freq: Two times a day (BID) | CUTANEOUS | Status: DC | PRN
  Filled 2020-08-06: qty 15

## 2020-08-06 MED ORDER — TRAZODONE HCL 50 MG PO TABS
25.0000 mg | ORAL_TABLET | Freq: Every day | ORAL | Status: DC
  Administered 2020-08-06 – 2020-08-08 (×3): 25 mg via ORAL
  Filled 2020-08-06 (×3): qty 1

## 2020-08-06 MED ORDER — FLUOXETINE HCL 10 MG PO CAPS
10.0000 mg | ORAL_CAPSULE | Freq: Every day | ORAL | Status: DC
Start: 2020-08-06 — End: 2020-08-09
  Administered 2020-08-06 – 2020-08-09 (×4): 10 mg via ORAL
  Filled 2020-08-06 (×4): qty 1

## 2020-08-06 MED ORDER — SODIUM CHLORIDE 0.9 % IV BOLUS
1000.0000 mL | Freq: Once | INTRAVENOUS | Status: AC
  Administered 2020-08-06: 1000 mL via INTRAVENOUS

## 2020-08-06 MED ORDER — BRINZOLAMIDE 1 % OP SUSP
1.0000 [drp] | Freq: Two times a day (BID) | OPHTHALMIC | Status: DC
  Administered 2020-08-06 – 2020-08-09 (×7): 1 [drp] via OPHTHALMIC
  Filled 2020-08-06: qty 10

## 2020-08-06 MED ORDER — POLYETHYLENE GLYCOL 3350 17 G PO PACK: 17.0000 g | PACK | Freq: Every day | ORAL | Status: DC | PRN

## 2020-08-06 MED ORDER — METOPROLOL TARTRATE 12.5 MG HALF TABLET
12.5000 mg | ORAL_TABLET | Freq: Two times a day (BID) | ORAL | Status: DC
  Administered 2020-08-06 – 2020-08-08 (×5): 12.5 mg via ORAL
  Filled 2020-08-06 (×5): qty 1

## 2020-08-06 MED ORDER — SIMVASTATIN 20 MG PO TABS
20.0000 mg | ORAL_TABLET | Freq: Every day | ORAL | Status: DC
  Administered 2020-08-06 – 2020-08-09 (×4): 20 mg via ORAL
  Filled 2020-08-06 (×4): qty 1

## 2020-08-06 NOTE — Progress Notes (Signed)
Sheila Andrade is an 85 y.o. female who was now in rehab who was previously admitted 3 weeks ago for failure to thrive. She was admitted for evaluation for SVT, multiple episodes of near syncope and one episode of syncope associated with hypotension.  She was admitted by Dr Shawnie Pons earlier this am. Please see note for detailed H&P.  On exam this am, she was found to be in sinus tachycardia.  She was started on low dose metoprolol.  Cardiology consulted for recurrent episodes of near syncope.   Kathlen Mody, MD

## 2020-08-06 NOTE — Progress Notes (Signed)
Inpatient Rehab Admissions Coordinator:   Pt. Transferred off of CIR for cardiac monitoring. Pt. Was previously admitted to CIR  after multiple appeals over the course of 2 weeks. We await notes and recommendations from therapy team to determine if Pt. Is an appropriate candidate for re-admission to CIR.   Megan Salon, MS, CCC-SLP Rehab Admissions Coordinator  [phone] (celll) [phone] (office)

## 2020-08-06 NOTE — Plan of Care (Signed)

## 2020-08-06 NOTE — Progress Notes (Signed)
Sheila Staggers, MD   Physician  Nursing  PMR Pre-admission     Signed  Date of Service:  07/23/2020  1:38 PM       Related encounter: ED to Hosp-Admission (Discharged) from 07/14/2020 in [facility]       Signed                                                                                                                                                                                                                                                                                                                                                                                                                                                                                                               PMR Admission Coordinator Pre-Admission Assessment   Patient: Sheila Andrade is an 85 y.o., female MRN: [id number] DOB: 1935/09/06 Height: 5\' 7"  (170.2 cm) Weight: 51.3 kg   Insurance Information HMO:    PPO: yes     PCP:      IPA:  80/20:      OTHER: PRIMARY: [employer] Medicare      Policy#: [id number]      Subscriber: Pt CM Name: Sheila Andrade      Phone#: [phone] X [phone]     Fax#: [phone] Pre-Cert#: [id number]      Employer: n/a Benefits: Online Phone: n/a - online at [email or web address] Eff Date: 01/06/2019- still active Deductible: does not have OOP Max: $4,000 ($1,439.67 met) CIR: $160/day co-pay with a max co-pay of $1,600/admission (10 days) SNF: 100% coverage Outpatient:  $20/visit co-pay Home Health:  100% coverage DME: 80% coverage; 20% co-insurance Providers: in network '  SECONDARY:       Policy#:      Phone#:   The "Data Collection Information Summary" for patients in Inpatient Rehabilitation Facilities with attached "Privacy Act [place] Records" was provided and verbally reviewed  with: Patient and Family   Emergency Contact Information Contact Information       Name Relation Home Work [place] Daughter [phone]   [phone]    [name],[name] Daughter     [phone]    [name] Daughter     [phone]           Current Medical History  Patient Admitting Diagnosis: Debility, Failure to thrive    History of Present Illness: 85 y.o. female with a history of depression who presented to the ED with siucidal ideation in the setting of continued functional decline, debility associated with tremors, bradykinesia.  She was initially started on Rocephin for possible UTI but subsequently stopped.  Psychiatry initially recommended inpatient psychiatric hospitalization but subsequently patient was no longer suicidal and hence psychiatry recommended outpatient follow-up.  Neurology was also consulted for possible movement disorder.hospital.  MRI of the brain brain showed moderate microvascular ischemic disease, mild to moderate atrophy, no acute abnormalities Neurology following due to Several overlapping clinical features between Parkinsonian disorder and severe depression/pseudodementia.  PT/OT evaluations were completed with recommendations for post acute rehab admission due to a functional decline.  Will admit today to acute inpatient rehab program.   Patient's medical record from [facility] has been reviewed by the rehabilitation admission coordinator and physician.   Past Medical History      Past Medical History:  Diagnosis Date   Hot flashes 06/06/2020   Hypertension        Family History   family history includes Hypertension in an other family member.   Prior Rehab/Hospitalizations Has the patient had prior rehab or hospitalizations prior to admission? Yes   Has the patient had major surgery during 100 days prior to admission? No               Current Medications   Current Facility-Administered Medications:    acetaminophen (TYLENOL) tablet 650 mg, 650 mg, Oral, Q6H PRN, 650 mg at 07/30/20 0908 **OR** acetaminophen (TYLENOL) suppository 650 mg, 650 mg, Rectal, Q6H PRN, Andrade, Anastassia, MD   bimatoprost (LUMIGAN) 0.01 % ophthalmic solution 1 drop, 1 drop, Left Eye, QHS, Andrade, Amrit, MD   brinzolamide (AZOPT) 1 % ophthalmic suspension 1 drop, 1 drop, Left Eye, BID, Andrade, Anastassia, MD, 1 drop at 08/02/20 0945   cholecalciferol (VITAMIN D) tablet 1,000 Units, 1,000 Units, Oral, Daily, Andrade, Amrit, MD, 1,000 Units at 08/02/20 1202   enoxaparin (LOVENOX) injection 40 mg, 40 mg, Subcutaneous, Q24H, Andrade, Kshitiz, MD, 40 mg at 08/01/20 1223   feeding supplement (ENSURE ENLIVE / ENSURE PLUS) liquid 237 mL, 237 mL, Oral, TID BM, Sheila Lund  K, MD, 237 mL at 08/02/20 0955   FLUoxetine (PROZAC) capsule 20 mg, 20 mg, Oral, Daily, Sheila Ravens, MD, 20 mg at 08/02/20 0944   hydrOXYzine (ATARAX/VISTARIL) tablet 25 mg, 25 mg, Oral, TID PRN, Sheila Dunnings B, MD   megestrol (MEGACE) 400 MG/10ML suspension 400 mg, 400 mg, Oral, Daily, Andrade, Kshitiz, MD, 400 mg at 08/02/20 0944   multivitamin with minerals tablet 1 tablet, 1 tablet, Oral, Daily, Andrade, Kshitiz, MD, 1 tablet at 08/02/20 0944   polyethylene glycol (MIRALAX / GLYCOLAX) packet 17 g, 17 g, Oral, Daily PRN, Sheila Odea, MD, 17 g at 07/29/20 1232   simvastatin (ZOCOR) tablet 20 mg, 20 mg, Oral, Daily, Andrade, Anastassia, MD, 20 mg at 08/02/20 0944   traZODone (DESYREL) tablet 25 mg, 25 mg, Oral, QHS, Andrade, Anastassia, MD, 25 mg at 08/01/20 2154   venlafaxine XR (EFFEXOR-XR) 24 hr capsule 37.5 mg, 37.5 mg, Oral, Q breakfast, Akintayo, Mojeed, MD, 37.5 mg at 08/02/20 0944   Patients Current Diet:  Diet Order                  Diet regular Room service appropriate? Yes; Fluid consistency: Thin  Diet effective now                         Precautions / Restrictions Precautions Precautions: Fall Precaution Comments: tends to lean  posteriorly Restrictions Weight Bearing Restrictions: No    Has the patient had 2 or more falls or a fall with injury in the past year? Yes   Prior Activity Level Limited Community (1-2x/wk): for appointments   Prior Functional Level Self Care: Did the patient need help bathing, dressing, using the toilet or eating? Needed some help   Indoor Mobility: Did the patient need assistance with walking from room to room (with or without device)? Needed some help   Stairs: Did the patient need assistance with internal or external stairs (with or without device)? Needed some help   Functional Cognition: Did the patient need help planning regular tasks such as shopping or remembering to take medications? Needed some help   Home Assistive Devices / [place] Devices/Equipment: Gilford Rile (specify type)   Prior Device Use: Indicate devices/aids used by the patient prior to current illness, exacerbation or injury? Walker   Current Functional Level Cognition   Overall Cognitive Status: Impaired/Different from baseline Current Attention Level:  (Internally distracted by fear of falling) Orientation Level: Oriented X4 Following Commands: Follows one step commands with increased time Safety/Judgement: Decreased awareness of safety, Decreased awareness of deficits General Comments: Needs repetition    Extremity Assessment (includes Sensation/Coordination)   Upper Extremity Assessment: Generalized weakness  Lower Extremity Assessment: Defer to PT evaluation     ADLs   Overall ADL's : Needs assistance/impaired Grooming: Moderate assistance, Standing, Wash/dry face, Brushing hair Grooming Details (indicate cue type and reason): Mod A external assist for standing balance. Pt requiring verbal cues for upright posture initially. Upper Body Bathing: Supervision/ safety, Sitting Lower Body Bathing: Maximal assistance, Sitting/lateral leans, Sit to/from stand Upper Body Dressing :  Supervision/safety, Sitting, Bed level Lower Body Dressing: Maximal assistance Lower Body Dressing Details (indicate cue type and reason): Max A for guiding toes into shoe and tying shoes. Toilet Transfer: Moderate assistance, RW Toilet Transfer Details (indicate cue type and reason): simulated toilet transfer this date, back and forth recliner to bed. Toileting- Clothing Manipulation and Hygiene: Total assistance, Sit to/from stand Toileting - Clothing Manipulation Details (indicate  cue type and reason): Total A for pericare and clothing management Functional mobility during ADLs: Moderate assistance, +2 for physical assistance, +2 for safety/equipment (Heavy mod A +2 for stand pivot transfers; mod A for sit<>stand) General ADL Comments: spent time working on scooting to the edge, leaning forward and standing.  stood times 4 with RW with increased time for bringing hips forward and shifting weight forward.     Mobility   Overal bed mobility: Needs Assistance Bed Mobility: Sit to Supine Rolling: Max assist Sidelying to sit: Max assist Supine to sit: Min assist, HOB elevated Sit to supine: Min assist General bed mobility comments: Max cues to scoot to EOB. Min A hand held assist for raising trunk     Transfers   Overall transfer level: Needs assistance Equipment used: Rolling walker (2 wheeled) Transfer via Lift Equipment: Stedy Transfers: Sit to/from Stand, Risk manager Sit to Stand: Min assist, Mod assist Stand pivot transfers: Min assist, Mod assist General transfer comment: Pt wanting to practice multiple sit to stands and stand pivots.  Performed 4 stand pivots from bed to chair and back and 1 stand pivot from bed to bsc and back (10 total) and 18 stands total.  To stand: cues for hand placement, to lean forward, and to tuck buttock and pull back shoulder to complete stand.  Cues for RW and to keep buttock tucked for stand pivot.  To sit: cued to always turn all the way until  both legs touch surface and reach back.  Pt initially mod A progressing to min A but with increased time to rise.     Ambulation / Gait / Stairs / Wheelchair Mobility   Ambulation/Gait Ambulation/Gait assistance: Min assist, +2 safety/equipment Gait Distance (Feet): 22 Feet (22'x2) Assistive device: Rolling walker (2 wheeled) Gait Pattern/deviations: Step-to pattern, Decreased stance time - left, Decreased weight shift to left, Shuffle General Gait Details: Pt with shuffle almost festinating pattern.  Cued to slow and take bigger steps with assist to weight shift.  Had chair follow.  22'x2 Gait velocity: decr Gait velocity interpretation: <1.8 ft/sec, indicate of risk for recurrent falls     Posture / Balance Dynamic Sitting Balance Sitting balance - Comments: Leaning posteriorly, min guard. Balance Overall balance assessment: Needs assistance Sitting-balance support: Feet supported, No upper extremity supported Sitting balance-Leahy Scale: Good Sitting balance - Comments: Leaning posteriorly, min guard. Postural control: Posterior lean Standing balance support: Bilateral upper extremity supported, During functional activity Standing balance-Leahy Scale: Poor Standing balance comment: Required RW.  On initial stand pt pushing legs into bed, leaning posteriorly at hips requiring tactile cues at pelvis and verbal cues for upright posture. Requiring mod A initially progressing to min guard- light min A     Special needs/care consideration Psych consult is pending from acute.  Currently has a private pay sitter through the family until 7 am 08/03/20.    Previous Home Environment (from acute therapy documentation) Living Arrangements: Children  Lives With: Daughter Available Help at Discharge: Family, Friend(s), Available PRN/intermittently Type of Home: House Home Layout: Two level, Able to live on main level with bedroom/bathroom Home Access: Stairs to enter Entrance Stairs-Rails:  None Entrance Stairs-Number of Steps: 3 Bathroom Shower/Tub: Chiropodist: Handicapped height Bathroom Accessibility: Yes How Accessible: Accessible via walker [place]: No (had [place] PT/OT in February 2022) Additional Comments: has been living with her dtr since pelvic fx ~ 8-9 mos ago   Discharge Living Setting Plans for Discharge Living Setting:  House (daughter's house) Type of Home at Discharge: House Discharge Home Layout: Two level, Able to live on main level with bedroom/bathroom Discharge Home Access: Stairs to enter Entrance Stairs-Rails: None Entrance Stairs-Number of Steps: 3 Discharge Bathroom Shower/Tub: Tub/shower unit Discharge Bathroom Toilet: Handicapped height Discharge Bathroom Accessibility: Yes How Accessible: Accessible via walker Does the patient have any problems obtaining your medications?: No   Social/Family/Support Systems Anticipated Caregiver: [place] Sit, daughter Anticipated Caregiver's Contact Information: [phone] Caregiver Availability: Confirmed family will provide 24/7 supervision upon discharge Discharge Plan Discussed with Primary Caregiver: Yes Is Caregiver In Agreement with Plan?: Yes Does Caregiver/Family have Issues with Lodging/Transportation while Pt is in Rehab?: No   Goals Patient/Family Goal for Rehab: PT/OT supervision to Min A goals Expected length of stay: 10-14 days Pt/Family Agrees to Admission and willing to participate: Yes Program Orientation Provided & Reviewed with Pt/Caregiver Including Roles  & Responsibilities: Yes   Decrease burden of Care through IP rehab admission: Specialzed equipment needs, Decrease number of caregivers, Bowel and bladder program, and Patient/family education   Possible need for SNF placement upon discharge: not anticipated    Patient Condition: I have reviewed medical records from [facility], spoken with CM, and patient and daughter. I met with  patient at the bedside for inpatient rehabilitation assessment.  Patient will benefit from ongoing PT, OT, and SLP, can actively participate in 3 hours of therapy a day 5 days of the week, and can make measurable gains during the admission.  Patient will also benefit from the coordinated team approach during an Inpatient Acute Rehabilitation admission.  The patient will receive intensive therapy as well as Rehabilitation physician, nursing, social worker, and care management interventions.  Due to safety, disease management, medication administration, pain management, and patient education the patient requires 24 hour a day rehabilitation nursing.  The patient is currently min to mod assist with mobility and basic ADLs.  Discharge setting and therapy post discharge at home with home health is anticipated.  Patient has agreed to participate in the Acute Inpatient Rehabilitation Program and will admit today.   Preadmission Screen Completed By:  Retta Diones, 08/02/2020 12:12 PM ______________________________________________________________________   Discussed status with Dr. Naaman Plummer on 08/02/20 at 80 and received approval for admission today.   Admission Coordinator:  Retta Diones, RN, time 1210/Date 08/02/20    Assessment/Plan: Diagnosis: debility and FTT related severe psychiatric disorder, medication SE's Does the need for close, 24 hr/day Medical supervision in concert with the patient's rehab needs make it unreasonable for this patient to be served in a less intensive setting? Yes Co-Morbidities requiring supervision/potential complications: severe epression Due to bladder management, bowel management, safety, skin/wound care, disease management, medication administration, pain management, and patient education, does the patient require 24 hr/day rehab nursing? Yes Does the patient require coordinated care of a physician, rehab nurse, PT, OT to address physical and functional deficits in the  context of the above medical diagnosis(es)? Yes Addressing deficits in the following areas: balance, endurance, locomotion, strength, transferring, bowel/bladder control, bathing, dressing, feeding, grooming, toileting, and psychosocial support Can the patient actively participate in an intensive therapy program of at least 3 hrs of therapy 5 days a week? Yes The potential for patient to make measurable gains while on inpatient rehab is excellent Anticipated functional outcomes upon discharge from inpatient rehab: supervision PT, supervision and min assist OT, n/a SLP Estimated rehab length of stay to reach the above functional goals is: 10-14 days Anticipated discharge destination:  Home 10. Overall Rehab/Functional Prognosis: excellent     MD Signature: Sheila Staggers, MD, [facility] 08/02/2020           Revision History                                            Note Details  Author Sheila Staggers, MD File Time 08/02/2020 12:23 PM  Author Type Physician Status Signed  Last Editor Sheila Andrade, [place] # [id number] Admit Date 08/02/2020

## 2020-08-06 NOTE — Progress Notes (Signed)
Physical Therapy Evaluation Patient Details Name: Sheila Andrade MRN: [id number] DOB: Feb 05, 1935 Today's Date: 08/06/2020   History of Present Illness  Pt is 85yo female admitted 8/2 to [facility] from CIR after tachycardic episode with SVTs and near syncopal episodes. Of note, prior admission on 7/10 for UTI, SI, and failure to thrive. PMH: recent COVID, glaucoma, osteoporosis, depression.  Clinical Impression  Pt presents with the impairments above and problems listed below. Pt performed transfers with moderate assist +2 with multimodal cues to correct posterior lean. Ambulated with use of RW and required min to mod assist +2 throughout. Noted posterior lean improved somewhat as pt was able to utilize BUEs on the walker. Pt asymptomatic throughout duration of session. Pt very eager to regain independence and was very motivated to work with PT. Recommending return to CIR for intensive rehab to increase functional mobility independence at discharge. We will continue to follow her acutely to promote independence with functional mobility.    Follow Up Recommendations CIR    Equipment Recommendations  Other (comment) (TBD, defer to post-acute rehb)    Recommendations for Other Services       Precautions / Restrictions Precautions Precautions: Fall Precaution Comments: near syncopal episodes prior to admission, presents with strong posterior lean Restrictions Weight Bearing Restrictions: No      Mobility  Bed Mobility               General bed mobility comments: Pt seated EOB at start of session    Transfers Overall transfer level: Needs assistance Equipment used: Rolling walker (2 wheeled);2 person hand held assist Transfers: Sit to/from [employer] Sit to Stand: Mod assist;+2 physical assistance Stand pivot transfers: Mod assist;+2 physical assistance       General transfer comment: Pt required mod A +2 to rise during sit to stand transfer. multimodal cuing to  correct posterior lean and cuing for hand placement.  Pt required mod A+2 for safety and steadying to transfer to [facility]. Pt noted to have posterior lean and required cues to shift weight anteriorly. Improved correction of posterior lean noted with use of RW upon standing from [facility].  Ambulation/Gait Ambulation/Gait assistance: +2 safety/equipment;Mod assist;Min assist Gait Distance (Feet): 20 Feet Assistive device: Rolling walker (2 wheeled) Gait Pattern/deviations: Decreased step length - right;Decreased step length - left;Leaning posteriorly;Trunk flexed Gait velocity: decreased   General Gait Details: Pt required min to mod A+2 for steadying during gait with RW. Pt demonstrated posterior trunk lean with trunk flexion that improved with increased distance. Pt tended to drift to the right; was able to correct with verbal cuing and manual assist.  Stairs            Wheelchair Mobility    Modified Rankin (Stroke Patients Only)       Balance Overall balance assessment: Needs assistance Sitting-balance support: Feet supported;No upper extremity supported Sitting balance-Leahy Scale: Good Sitting balance - Comments: Pt seated EOB upon entry   Standing balance support: During functional activity;Bilateral upper extremity supported Standing balance-Leahy Scale: Poor Standing balance comment: Pt maxA to maintain static standing at EOB without use of AD; when utilizing RW pt required min to modA and reliant on RW                             Pertinent Vitals/Pain Pain Assessment: No/denies pain    Home Living Family/patient expects to be discharged to:: Inpatient rehab  Prior Function Level of Independence: Needs assistance   Gait / Transfers Assistance Needed: Pt had been working with CIR staff on ambulating with RW and performing WC mobility.  ADL's / Homemaking Assistance Needed: Pt working with [employer] staff on ADL tasks.        Hand  Dominance   Dominant Hand: Right    Extremity/Trunk Assessment   Upper Extremity Assessment Upper Extremity Assessment: Generalized weakness    Lower Extremity Assessment Lower Extremity Assessment: Generalized weakness    Cervical / Trunk Assessment Cervical / Trunk Assessment: Kyphotic  Communication   Communication: No difficulties  Cognition Arousal/Alertness: Awake/alert Behavior During Therapy: WFL for tasks assessed/performed Overall Cognitive Status: No family/caregiver present to determine baseline cognitive functioning Area of Impairment: Safety/judgement;Following commands;Awareness;Problem solving;Memory                   Current Attention Level: Sustained Memory: Decreased short-term memory Following Commands: Follows one step commands with increased time Safety/Judgement: Decreased awareness of safety Awareness: Emergent Problem Solving: Slow processing;Requires verbal cues General Comments: Pt with slow processing and decreased safety awareness.      General Comments      Exercises     Assessment/Plan    PT Assessment Patient needs continued PT services  PT Problem List Decreased strength;Decreased mobility;Decreased activity tolerance;Decreased knowledge of use of DME;Decreased balance;Decreased safety awareness;Decreased cognition       PT Treatment Interventions DME instruction;Therapeutic activities;Gait training;Functional mobility training;Therapeutic exercise;Patient/family education;Balance training    PT Goals (Current goals can be found in the Care Plan section)  Acute Rehab PT Goals Patient Stated Goal: to get better PT Goal Formulation: With patient Time For Goal Achievement: 08/20/20 Potential to Achieve Goals: Good    Frequency Min 3X/week   Barriers to discharge        Co-evaluation               AM-PAC PT "6 Clicks" Mobility  Outcome Measure Help needed turning from your back to your side while in a flat bed  without using bedrails?: A Little Help needed moving from lying on your back to sitting on the side of a flat bed without using bedrails?: A Little Help needed moving to and from a bed to a chair (including a wheelchair)?: A Lot Help needed standing up from a chair using your arms (e.g., wheelchair or bedside chair)?: A Lot Help needed to walk in hospital room?: A Lot Help needed climbing 3-5 steps with a railing? : Total 6 Click Score: 13    End of Session Equipment Utilized During Treatment: Gait belt Activity Tolerance: Patient tolerated treatment well Patient left: in chair;with call bell/phone within reach;with chair alarm set Nurse Communication: Mobility status PT Visit Diagnosis: Other abnormalities of gait and mobility (R26.89);Difficulty in walking, not elsewhere classified (R26.2);Unsteadiness on feet (R26.81);Muscle weakness (generalized) (M62.81)    Time: 1610-9604 PT Time Calculation (min) (ACUTE ONLY): 16 min   Charges:   PT Evaluation $PT Eval Moderate Complexity: 1 Mod          Johnn Hai, SPT  Johnn Hai 08/06/2020, 5:11 PM

## 2020-08-06 NOTE — Progress Notes (Signed)
Pt declined bladder scan tonight and asks that they only be done during the daytime hours.

## 2020-08-06 NOTE — H&P (Signed)
Medical Consultation   Sheila Andrade  CZY:[id number]  DOB: 06-20-1935  DOA: 08/02/2020  PCP: Dalphine Handing, MD    Requesting physician: Jacalyn Lefevre, NP  Reason for admission: Tachycardia   History of Present Illness: Sheila Andrade is an 85 y.o. female who was now in rehab who was previously admitted 3 weeks ago for failure to thrive. She had been improving with getting her appetite back but not eating or drinking as much as usual.  Patient has a history of near syncope multiple times and it sometimes has had a rapid heart rate.  In care everywhere is noted that she has a history of paroxysmal SVT though neither she nor her daughters have any recollection of her having this. Tonight she was in her usual state of health and getting vital signs and they were noted to have tachycardia at 140.  During that time her blood pressure was low at 80/60 she was asked to lie down her blood pressure improved.  She was in this rhythm which was described as regular by the nurse for approximately 10 to 15 minutes and spontaneously converted.  An EKG was done but at that time she was back in sinus rhythm.  She has had a normal TSH in the last few weeks and a normal echo in 6 of 2022.  Every time she was admitted with near syncope she had a work-up that included troponins which have all been negative.  Review of Systems:   As per HPI otherwise 10 point review of systems negative.    Review of Systems Past Medical History: Past Medical History:  Diagnosis Date   Hot flashes 06/06/2020   Hypertension     Past Surgical History: History reviewed. No pertinent surgical history.   Allergies:  No Known Allergies   Social History:  reports that she has never smoked. She has never used smokeless tobacco. She reports that she does not use drugs. No history on file for alcohol use.   Family History: Family History  Problem Relation Age of Onset   Hypertension Other         Physical Exam: Vitals:   08/05/20 2102 08/05/20 2116 08/05/20 2316 08/05/20 2347  BP: 102/77   98/63  Pulse: (!) 140 80 (!) 136 80  Resp:    16  Temp:    99.3 F (37.4 C)  TempSrc:    Oral  SpO2: 98%   96%  Weight:      Height:        Constitutional: Alert and awake, oriented x3, not in any acute distress. Eyes: PERLA, EOMI, irises appear normal, anicteric sclera,  ENMT: external ears and nose appear normal, normal hearing            Lips appears normal, oropharynx mucosa, tongue, posterior pharynx appear normal  Neck: neck appears normal, no masses, normal ROM, no thyromegaly, no JVD  CVS: S1-S2 clear, no murmur rubs or gallops, no LE edema, normal pedal pulses  Respiratory:  clear to auscultation bilaterally, no wheezing, rales or rhonchi. Respiratory effort normal. No accessory muscle use.  Abdomen: soft nontender, nondistended, normal bowel sounds, no hepatosplenomegaly, no hernias  Musculoskeletal: : no cyanosis, clubbing or edema noted bilaterally Neuro: Grossly normal Psych: judgement and insight appear normal, stable mood and affect, mental status Skin: no rashes or lesions or ulcers, no induration or nodules    Data reviewed:  I have  personally reviewed following labs and imaging studies Labs:  CBC: Recent Labs  Lab 08/03/20 0534 08/05/20 0515  WBC 8.3 6.9  NEUTROABS 4.7  --   HGB 11.4* 11.9*  HCT 35.0* 36.7  MCV 88.6 88.9  PLT 391 391    Basic Metabolic Panel: Recent Labs  Lab 08/03/20 0534 08/05/20 0515  NA 134* 138  K 4.2 3.9  CL 106 108  CO2 21* 20*  GLUCOSE 96 92  BUN 31* 28*  CREATININE 0.73 0.87  CALCIUM 9.3 9.2   GFR Estimated Creatinine Clearance: 42.1 mL/min (by C-G formula based on SCr of 0.87 mg/dL). Liver Function Tests: Recent Labs  Lab 08/03/20 0534  AST 16  ALT 16  ALKPHOS 45  BILITOT 0.5  PROT 5.7*  ALBUMIN 2.9*    Urinalysis    Component Value Date/Time   COLORURINE YELLOW 07/20/2020 1759   APPEARANCEUR  HAZY (A) 07/20/2020 1759   LABSPEC 1.013 07/20/2020 1759   LABSPEC 1.020 05/27/2020 1541   PHURINE 7.0 07/20/2020 1759   GLUCOSEU NEGATIVE 07/20/2020 1759   HGBUR NEGATIVE 07/20/2020 1759   BILIRUBINUR NEGATIVE 07/20/2020 1759   BILIRUBINUR negative 05/27/2020 1541   KETONESUR NEGATIVE 07/20/2020 1759   PROTEINUR NEGATIVE 07/20/2020 1759   NITRITE NEGATIVE 07/20/2020 1759   LEUKOCYTESUR NEGATIVE 07/20/2020 1759    Inpatient Medications:   Scheduled Meds:  brinzolamide  1 drop Left Eye BID   cholecalciferol  1,000 Units Oral Daily   enoxaparin (LOVENOX) injection  40 mg Subcutaneous Q24H   feeding supplement  237 mL Oral TID BM   FLUoxetine  10 mg Oral Daily   latanoprost  1 drop Left Eye QHS   megestrol  400 mg Oral Daily   multivitamin with minerals  1 tablet Oral Daily   naphazoline-glycerin  2 drop Left Eye TID AC & HS   simvastatin  20 mg Oral Daily   traZODone  25 mg Oral QHS   Continuous Infusions:   Radiological Exams on Admission: No results found.  Impression/Recommendations Principal Problem:   Debility Active Problems:   Pressure injury of skin   Failure to thrive in adult   Urinary retention   Hyperlipidemia   Hypertension   Paroxysmal SVT (supraventricular tachycardia) (HCC)   Primary open angle glaucoma  New onset tachycardia Given that this was brief, regular and with this history suspect SVT.  Her blood pressure seems to drop during these episodes and suspect she needs the atrial kick to not have this happen. Should this become repetitive would consider super low-dose beta-blocker if needed.  We will hold on this for now. Serial troponins Normal echo in June of this year in care everywhere Normal TSH in the last 3 weeks Transfer to telemetry IV fluid bolus  Hypertension Holding all antihypertensives for now was on ARB previously  Hyperlipidemia Continue Zocor  Glaucoma Continue Xalatan and Azopt  Failure to thrive Continue  Megace  Depression Continue Paxil 10 mg    Thank you for this consultation.  Our [facility] hospitalist team will follow the patient with you.   Time Spent: 43 minutes  Reva Bores M.D. [facility] 08/06/2020, 12:19 AM

## 2020-08-06 NOTE — Consult Note (Addendum)
Cardiology Consultation:   Patient ID: Sheila Andrade MRN: [id number]; DOB: 02-20-1935  Admit date: 08/06/2020 Date of Consult: 08/06/2020  PCP:  Sheila Handing, MD   [facility] Providers Cardiologist:  None       new Patient Profile:   Sheila Andrade is a 85 y.o. female with a hx of HLD, HTN, iron def anemia multiple episodes of near syncope and PSVT and recent admit for failure to thrive was on Rehab (CIR) but last evening HR to 140 with hypotension who is being seen 08/06/2020 for the evaluation of SVT at the request of Sheila Andrade.  History of Present Illness:   Sheila Andrade with hx of HTN,  SVT and near syncope was in CIR after admit for failure to thrive.  Yesterday HR to 140.  BP down to 80/60 while sitting up.   Once flat BP did improve.  She was back in SVT on EKG.  She was admitted back to acute hospital for further management.   Echo 06/14/20 with EF >55%, trivial AR, mild AS,  Hx of wt loss anemia  05/31/20 near syncope + COVID , BP was low  06/13/20 near syncope felt to be poor po intake EKG and Echo unrevealing Her olmesartan held. On that ER visit she reported several episodes of near syncope.   In dx there is mention of SVT but none on visit.  EKG:  The EKG was personally reviewed and demonstrates:  yesterday 2300 SVT at 134 with non specific T wave and ST abnormalities.  Telemetry:  Telemetry was personally reviewed and demonstrates:  several episodes of SVT early AM and again around 1000 now on lopressor 12.5 BID  BP 101/63 to 119/77 afebrile  No hx of CAD no MI no stress test.  No FH of CAD No chest pain.    Past Medical History:  Diagnosis Date   Hot flashes 06/06/2020   Hypertension     History reviewed. No pertinent surgical history.   Home Medications:  Prior to Admission medications   Medication Sig Start Date End Date Taking? Authorizing Provider  acetaminophen (TYLENOL) 500 MG tablet Take 1,000 mg by mouth every 6 (six) hours as needed for mild pain or  headache. 03/15/20  Yes [provider]  bimatoprost (LUMIGAN) 0.01 % SOLN Place 1 drop into the left eye at bedtime. 06/19/13  Yes [provider]  brinzolamide (AZOPT) 1 % ophthalmic suspension Place 1 drop into the left eye 2 (two) times daily. 01/04/19  Yes [provider]  cholecalciferol (VITAMIN D) 25 MCG (1000 UNIT) tablet Take 1,000 Units by mouth daily.   Yes [provider]  feeding supplement (ENSURE ENLIVE / ENSURE PLUS) LIQD Take 237 mLs by mouth 3 (three) times daily between meals. 08/02/20  Yes Sheila Pop, MD  FLUoxetine (PROZAC) 10 MG capsule Take 1 capsule (10 mg total) by mouth daily. 08/03/20  Yes Sheila Pop, MD  hydrOXYzine (ATARAX/VISTARIL) 25 MG tablet Take 1 tablet (25 mg total) by mouth 3 (three) times daily as needed for anxiety. 08/02/20  Yes Sheila Pop, MD  megestrol (MEGACE) 400 MG/10ML suspension Take 10 mLs (400 mg total) by mouth daily. 08/03/20  Yes Sheila Pop, MD  Multiple Vitamin (MULTIVITAMIN WITH MINERALS) TABS tablet Take 1 tablet by mouth daily. 08/03/20  Yes Sheila Pop, MD  Multiple Vitamins-Minerals (MULTIVITAMIN WITH MINERALS) tablet Take 1 tablet by mouth daily.   Yes [provider]  nystatin (MYCOSTATIN/NYSTOP) powder Apply 1 application topically 2 (two) times daily  as needed (rash under arm). 08/02/20  Yes Sheila PopAdhikari, Amrit, MD  omeprazole (PRILOSEC) 40 MG capsule Take 40 mg by mouth daily as needed (heartburn). 05/10/20  Yes [provider]  ondansetron (ZOFRAN-ODT) 4 MG disintegrating tablet Take 4 mg by mouth every 8 (eight) hours as needed for nausea/vomiting. 02/08/20  Yes [provider]  polyethylene glycol (MIRALAX / GLYCOLAX) 17 g packet Take 17 g by mouth daily as needed for mild constipation.   Yes [provider]  simvastatin (ZOCOR) 20 MG tablet Take 20 mg by mouth daily. 05/10/20  Yes [provider]  traZODone (DESYREL) 50 MG tablet Take 0.5 tablets  (25 mg total) by mouth at bedtime. 08/02/20   Sheila PopAdhikari, Amrit, MD  venlafaxine XR (EFFEXOR-XR) 37.5 MG 24 hr capsule Take 1 capsule (37.5 mg total) by mouth daily with breakfast. Last dose on 08/04/20 Patient not taking: No sig reported 08/03/20   Sheila PopAdhikari, Amrit, MD    Inpatient Medications: Scheduled Meds:  brinzolamide  1 drop Left Eye BID   enoxaparin (LOVENOX) injection  40 mg Subcutaneous Daily   feeding supplement  237 mL Oral TID BM   FLUoxetine  10 mg Oral Daily   latanoprost  1 drop Left Eye QHS   megestrol  400 mg Oral Daily   metoprolol tartrate  12.5 mg Oral BID   multivitamin with minerals  1 tablet Oral Daily   pantoprazole  40 mg Oral Daily   simvastatin  20 mg Oral Daily   traZODone  25 mg Oral QHS   Continuous Infusions:  PRN Meds: acetaminophen **OR** acetaminophen, nystatin, polyethylene glycol  Allergies:   No Known Allergies  Social History:   Social History   Socioeconomic History   Marital status: Widowed    Spouse name: Not on file   Number of children: Not on file   Years of education: Not on file   Highest education level: Not on file  Occupational History   Not on file  Tobacco Use   Smoking status: Never   Smokeless tobacco: Never  Substance and Sexual Activity   Alcohol use: Not on file   Drug use: Never   Sexual activity: Not on file  Other Topics Concern   Not on file  Social History Narrative   Not on file   Social Determinants of Health   Financial Resource Strain: Not on file  Food Insecurity: Not on file  Transportation Needs: Not on file  Physical Activity: Not on file  Stress: Not on file  Social Connections: Not on file  Intimate Partner Violence: Not on file    Family History:    Family History  Problem Relation Age of Onset   Hypertension Other      ROS:  Please see the history of present illness.  General:no colds or fevers, no weight changes Skin:no rashes or ulcers HEENT:no blurred vision, no  congestion CV:see HPI PUL:see HPI GI:no diarrhea constipation or melena, no indigestion GU:no hematuria, no dysuria MS:no joint pain, no claudication Neuro:no syncope, no lightheadedness, + near syncope Endo:no diabetes, no thyroid disease Psych: depression  All other ROS reviewed and negative.     Physical Exam/Data:   Vitals:   08/06/20 0200 08/06/20 0437 08/06/20 0838 08/06/20 1117  BP:  (!) 122/103 101/63 119/77  Pulse:  (!) 124 77 79  Resp:  20  16  Temp:  98.7 F (37.1 C) 98.2 F (36.8 C) 98.3 F (36.8 C)  TempSrc:  Oral Oral Oral  SpO2:  98% 98% 98%  Weight: 51.2 kg     Height: 5\' 7"  (1.702 m)       Intake/Output Summary (Last 24 hours) at 08/06/2020 1746 Last data filed at 08/06/2020 1315 Gross per 24 hour  Intake 548.3 ml  Output 450 ml  Net 98.3 ml   Last 3 Weights 08/06/2020 08/05/2020 07/14/2020  Weight (lbs) 112 lb 14 oz 117 lb 15.1 oz 113 lb  Weight (kg) 51.2 kg 53.5 kg 51.256 kg     Body mass index is 17.68 kg/m.  General:  thin female , in no acute distress sitting up in chair HEENT: normal Lymph: no adenopathy Neck: no JVD sitting up Endocrine:  No thryomegaly Vascular: No carotid bruits; pedal pulses 1+ bilaterally  Cardiac:  normal S1, S2; RRR; 2/6 systolic murmur no gallup or rub Lungs:  clear to auscultation bilaterally, no wheezing, rhonchi or rales  Abd: soft, nontender, no hepatomegaly  Ext: no edema Musculoskeletal:  No deformities, BUE and BLE strength normal and equal Skin: warm and dry  Neuro:  alert and oriented X 3 MAE follows commands, no focal abnormalities noted Psych:  Normal affect    Relevant CV Studies: Echo  Echo 06/14/20 with EF >55%, trivial AR, mild AS, at [facility]  Laboratory Data:  High Sensitivity Troponin:   Recent Labs  Lab 08/06/20 0452  TROPONINIHS 17     Chemistry Recent Labs  Lab 08/03/20 0534 08/05/20 0515 08/06/20 0119  NA 134* 138  --   K 4.2 3.9 4.2  CL 106 108  --   CO2 21* 20*  --   GLUCOSE  96 92  --   BUN 31* 28*  --   CREATININE 0.73 0.87  --   CALCIUM 9.3 9.2  --   GFRNONAA >60 >60  --   ANIONGAP 7 10  --     Recent Labs  Lab 08/03/20 0534  PROT 5.7*  ALBUMIN 2.9*  AST 16  ALT 16  ALKPHOS 45  BILITOT 0.5   Hematology Recent Labs  Lab 08/03/20 0534 08/05/20 0515  WBC 8.3 6.9  RBC 3.95 4.13  HGB 11.4* 11.9*  HCT 35.0* 36.7  MCV 88.6 88.9  MCH 28.9 28.8  MCHC 32.6 32.4  RDW 14.7 14.8  PLT 391 391   BNPNo results for input(s): BNP, PROBNP in the last 168 hours.  DDimer No results for input(s): DDIMER in the last 168 hours.   Radiology/Studies:  No results found.   Assessment and Plan:   SVT with BP drop, BP improved with lying down - several episodes since on tele, echo in June with normal EF. Normal TSH last 3 weeks she was given fluid bolus BP stable ? Change to verapamil ? Defer to MD. BP is soft  Recent episodes of hypotension and near syncope seen in ER. No arrhythmias then  Murmur prob AS but mild on echo in June HTN but stable off ARB, stopped for near syncope and low BP HLD on zocor  Depression on paxil per IM Failure to thrive with known wt loss of at least 18 lbs from 12/2019 until 05/2020.  She tells me it has been total of 25 lbs.  Just no appetite - GI eval was neg.   Risk Assessment/Risk Scores:       For questions or updates, please contact [facility] Please consult [email or web address] for contact info under    Signed, 06/2020, MD  08/06/2020 5:46 PM   History and all data  above reviewed.  Patient examined.  I agree with the findings as above.   She has had a complicated history since a pelvic fracture.   He has not reported any cardiac history.  However, there is mention of SVT.  She was in rehab and developed sustained narrow complex tachycardia arrhythmia.  She says she feels palpitations occasionally but she really would not know if she was depressed last night.  However, she was noted to have hypotension.  With her  recent failure to thrive and weight loss she has had some low blood pressures and has had to have been suggested to mitigate this.  Since this event she has been placed on a low-dose of metoprolol.  She does not have any further tachyarrhythmias.  She says she has not had any previous syncope.  She does not describe chest pressure, neck or arm discomfort.  She had no weight gain or edema.  The patient exam reveals COR: Regular rate and rhythm, 3 of 6 apical systolic murmur radiating slightly at the aortic outflow tract, no diastolic,  Lungs: Clear to auscultation bilaterally.  No wheezing, no crackles,  Abd: Positive bowel sounds, no rebound, guarding, Ext 2+ pulses, mild ankle edema.  All available labs, radiology testing, previous records reviewed. Agree with documented assessment and plan.  Supraventricular tachycardia : Certainly conservative therapy is indicated.  I think it is reasonable to see if he tolerates a low-dose beta-blocker first.  If not we could try calcium channel blocker.  Currently have not been considering any invasive procedures.  Murmur patient has some mild aortic stenosis.  No change in therapy.  Fayrene Fearing Kimmy Parish  5:47 PM  08/06/2020

## 2020-08-06 NOTE — Progress Notes (Signed)
Patient still having episodes of in and out SVTs, asymptomatic, IVF NS bolus started as ordered, EKG done, Pt will be transferred to [facility] for close cardiac monitoring as per Dr's order.

## 2020-08-06 NOTE — Progress Notes (Signed)
Inpatient Rehabilitation Admissions Coordinator   I will place order for rehab consult to as ess for possible return to CIR.  Ottie Glazier, RN, MSN Rehab Admissions Coordinator [phone] 08/06/2020 7:51 PM

## 2020-08-06 NOTE — Progress Notes (Signed)
Initial Nutrition Assessment  DOCUMENTATION CODES:   Underweight, Severe malnutrition in context of chronic illness  INTERVENTION:   -Continue Ensure Enlive po TID, each supplement provides 350 kcal and 20 grams of protein  -MVI with minerals daily -Magic cup TID with meals, each supplement provides 290 kcal and 9 grams of protein   NUTRITION DIAGNOSIS:   Severe Malnutrition related to chronic illness (MDD) as evidenced by moderate fat depletion, severe fat depletion, severe muscle depletion.  GOAL:   Patient will meet greater than or equal to 90% of their needs  MONITOR:   PO intake, Supplement acceptance, Labs, Weight trends, Skin, I & O's  REASON FOR ASSESSMENT:   Malnutrition Screening Tool    ASSESSMENT:   Sheila Andrade is an 85 y.o. female who was now in rehab who was previously admitted 3 weeks ago for failure to thrive.  Pt admitted with debility and new onset tachycardia.   Reviewed I/O's: +68 ml x 24 hours  UOP: 450 ml x 24 hours  Spoke with at bedside, who was very distracted at time of visit. Pt reports "I was eating well until I started having heart problems". She shares she had minimal breakfast this morning. Documented meal completions 15%.   Pt endorses ongoing weight loss over the past year (about 25#). Reviewed wt hx; pt has experienced a 5.9% wt loss over the past 2 months, which while not significant for time frame, is concerning given underweight status.   Discussed importance of good meal and supplement intake to promote healing. She is amenable to continue Ensure Enlive supplements.   Medications reviewed and include megace and 0.9% sodium chloride infusion @ 125 ml/hr.   Labs reviewed.   NUTRITION - FOCUSED PHYSICAL EXAM:  Flowsheet Row Most Recent Value  Orbital Region Moderate depletion  Upper Arm Region Severe depletion  Thoracic and Lumbar Region Severe depletion  Buccal Region Severe depletion  Temple Region Severe depletion   Clavicle Bone Region Severe depletion  Clavicle and Acromion Bone Region Severe depletion  Scapular Bone Region Severe depletion  Dorsal Hand Severe depletion  Patellar Region Severe depletion  Anterior Thigh Region Severe depletion  Posterior Calf Region Severe depletion  Edema (RD Assessment) None  Hair Reviewed  Eyes Reviewed  Mouth Reviewed  Skin Reviewed  Nails Reviewed       Diet Order:   Diet Order             Diet Heart Room service appropriate? Yes; Fluid consistency: Thin  Diet effective now                   EDUCATION NEEDS:   Education needs have been addressed  Skin:  Skin Assessment: Skin Integrity Issues: Skin Integrity Issues:: Stage I Stage I: buttocks Stage II: -  Last BM:  08/04/20  Height:   Ht Readings from Last 1 Encounters:  08/06/20 5\' 7"  (1.702 m)    Weight:   Wt Readings from Last 1 Encounters:  08/06/20 51.2 kg    Ideal Body Weight:  61.4 kg  BMI:  Body mass index is 17.68 kg/m.  Estimated Nutritional Needs:   Kcal:  1600-1800  Protein:  80-95 grams  Fluid:  > 1.6 L    10/06/20, RD, LDN, CDCES Registered Dietitian II Certified Diabetes Care and Education Specialist Please refer to [facility] for RD and/or RD on-call/weekend/after hours pager

## 2020-08-07 DIAGNOSIS — I471 Supraventricular tachycardia: Secondary | ICD-10-CM | POA: Diagnosis not present

## 2020-08-07 DIAGNOSIS — I350 Nonrheumatic aortic (valve) stenosis: Secondary | ICD-10-CM | POA: Diagnosis not present

## 2020-08-07 DIAGNOSIS — I10 Essential (primary) hypertension: Secondary | ICD-10-CM

## 2020-08-07 NOTE — Progress Notes (Signed)
PROGRESS NOTE    Sheila Andrade  PPJ:[id number] DOB: 23-Jan-1935 DOA: 08/06/2020 PCP: Dalphine Handing, MD    No chief complaint on file.   Brief Narrative:  Sheila Andrade is an 85 y.o. female who was now in rehab who was previously admitted 3 weeks ago for failure to thrive. She was admitted for evaluation for SVT, multiple episodes of near syncope and one episode of syncope associated with hypotension. Hypotension resolved.  She was started on low dose metoprolol. Cardiology consulted for recurrent episodes of near syncope.  Assessment & Plan:   Active Problems:   Tachycardia   SVT:  Asymptomatic, few episodes in the last 24 hours.  Metoprolol ata 12.5 mg BID.  Echo unremarkable.    Hyperlipidemia:  Resume zocor.    Depression:  Resume prozac.    Poor appetite / failure to thrive:  Megace added.  Therapy eval recommending CIR.  Consult in place.     DVT prophylaxis: (Lovenox.  Code Status: Full code.  Family Communication:family at bedside.  Disposition:   Status is: Observation  The patient will require care spanning > 2 midnights and should be moved to inpatient because: Unsafe d/c plan  Dispo: The patient is from:  CIR              Anticipated d/c is to: CIR              Patient currently is not medically stable to d/c.   Difficult to place patient No       Consultants:  Cardiology Psychiatry.   Procedures: none.   Antimicrobials: none.    Subjective: No new complaints.   Objective: Vitals:   08/07/20 0042 08/07/20 0415 08/07/20 0746 08/07/20 0936  BP: (!) 131/55 126/87 121/77 114/72  Pulse: 66 69 72 75  Resp: 18 18 17    Temp: 98.4 F (36.9 C) 98.9 F (37.2 C) 98.6 F (37 C)   TempSrc: Oral Oral Oral   SpO2: 100% 100% 100%   Weight: 53.1 kg     Height:        Intake/Output Summary (Last 24 hours) at 08/07/2020 1052 Last data filed at 08/07/2020 0838 Gross per 24 hour  Intake 720 ml  Output 1025 ml  Net -305 ml   Filed  Weights   08/06/20 0200 08/07/20 0042  Weight: 51.2 kg 53.1 kg    Examination:  General exam: Appears calm and comfortable  Respiratory system: Clear to auscultation. Respiratory effort normal. Cardiovascular system: S1 & S2 heard, RRR. No JVD, No pedal edema. Gastrointestinal system: Abdomen is nondistended, soft and nontender.. Normal bowel sounds heard. Central nervous system: Alert and oriented. No focal neurological deficits. Extremities: Symmetric 5 x 5 power. Skin: No rashes, lesions or ulcers Psychiatry: Mood & affect appropriate.     Data Reviewed: I have personally reviewed following labs and imaging studies  CBC: Recent Labs  Lab 08/03/20 0534 08/05/20 0515  WBC 8.3 6.9  NEUTROABS 4.7  --   HGB 11.4* 11.9*  HCT 35.0* 36.7  MCV 88.6 88.9  PLT 391 391    Basic Metabolic Panel: Recent Labs  Lab 08/03/20 0534 08/05/20 0515 08/06/20 0119  NA 134* 138  --   K 4.2 3.9 4.2  CL 106 108  --   CO2 21* 20*  --   GLUCOSE 96 92  --   BUN 31* 28*  --   CREATININE 0.73 0.87  --   CALCIUM 9.3 9.2  --   MG  --   --  2.2    GFR: Estimated Creatinine Clearance: 41.8 mL/min (by C-G formula based on SCr of 0.87 mg/dL).  Liver Function Tests: Recent Labs  Lab 08/03/20 0534  AST 16  ALT 16  ALKPHOS 45  BILITOT 0.5  PROT 5.7*  ALBUMIN 2.9*    CBG: No results for input(s): GLUCAP in the last 168 hours.   No results found for this or any previous visit (from the past 240 hour(s)).       Radiology Studies: No results found.      Scheduled Meds:  brinzolamide  1 drop Left Eye BID   enoxaparin (LOVENOX) injection  40 mg Subcutaneous Daily   feeding supplement  237 mL Oral TID BM   FLUoxetine  10 mg Oral Daily   latanoprost  1 drop Left Eye QHS   megestrol  400 mg Oral Daily   metoprolol tartrate  12.5 mg Oral BID   multivitamin with minerals  1 tablet Oral Daily   pantoprazole  40 mg Oral Daily   simvastatin  20 mg Oral Daily   traZODone  25  mg Oral QHS   Continuous Infusions:   LOS: 0 days        Kathlen Mody, MD [facility]   To contact the attending provider between 7A-7P or the covering provider during after hours 7P-7A, please log into the web site [email or web address] and access using universal [place] password for that web site. If you do not have the password, please call the hospital operator.  08/07/2020, 10:52 AM

## 2020-08-07 NOTE — Evaluation (Signed)
Occupational Therapy Evaluation Patient Details Name: Sheila Andrade MRN: [id number] DOB: 14-Sep-1935 Today's Date: 08/07/2020    History of Present Illness Pt is 84yo female admitted 8/2 to [facility] from CIR after tachycardic episode with SVTs and near syncopal episodes. Of note, prior admission on 7/10 for UTI, SI, and failure to thrive. PMH: recent COVID, glaucoma, osteoporosis, depression.   Clinical Impression   PTA, pt admitted from CIR due to diagnoses above. Pt received in bed, motivated for OOB activities. Pt requires significant assist to come to standing position at Mod A using RW secondary to posterior lean. Once up in standing, pt able to mobilize in room using RW at min guard assist. Pt requires Min A for UB ADLs and grossly Mod A for LB ADLs. Pt hopeful to return to CIR to maximize independence. Will continue to follow acutely to progress ADL transfers and balance during LB ADLs.     Follow Up Recommendations  CIR    Equipment Recommendations  Other (comment) (defer to postacute rehab)    Recommendations for Other Services Rehab consult     Precautions / Restrictions Precautions Precautions: Fall Precaution Comments: near syncopal episodes prior to admission, posterior lean Restrictions Weight Bearing Restrictions: No      Mobility Bed Mobility Overal bed mobility: Needs Assistance Bed Mobility: Supine to Sit;Sit to Supine     Supine to sit: Min assist;HOB elevated Sit to supine: Min guard   General bed mobility comments: light Min A to scoot hips forward, use of bed rails. able to get B LE back into bed with min guard only    Transfers Overall transfer level: Needs assistance Equipment used: Rolling walker (2 wheeled) Transfers: Sit to/from Stand Sit to Stand: Mod assist         General transfer comment: Mod A for sit to stand transfers with posterior lean using RW. Guided pt in additional sit to stand trials x 3 with continued Mod A required    Balance  Overall balance assessment: Needs assistance Sitting-balance support: Feet supported;No upper extremity supported Sitting balance-Leahy Scale: Good Sitting balance - Comments: able to sit unsupported, lean trunk multidirectionally without LOB   Standing balance support: During functional activity;Bilateral upper extremity supported Standing balance-Leahy Scale: Poor Standing balance comment: reliant on external support                           ADL either performed or assessed with clinical judgement   ADL Overall ADL's : Needs assistance/impaired                     Lower Body Dressing: Sit to/from stand;Moderate assistance Lower Body Dressing Details (indicate cue type and reason): With increased effort, pt able to demo ability to cross LEs to reach tennis shoes. will need increased assist in standing due to posterior lean for sit to stand transitions             Functional mobility during ADLs: Min guard;Rolling walker;Cueing for sequencing General ADL Comments: Pt requires increased assist for initial sit to stand transfers due to posterior lean but able to mobilize on min guard basis once up on feet. Slow pace and pt at increased risk for falls during ADLs.`     Vision Baseline Vision/History: Glaucoma Wears Glasses: At all times Patient Visual Report: No change from baseline Vision Assessment?: Vision impaired- to be further tested in functional context     Perception Perception Perception Tested?: No  Praxis      Pertinent Vitals/Pain Pain Assessment: No/denies pain Faces Pain Scale: No hurt Pain Intervention(s): Monitored during session     Hand Dominance Right   Extremity/Trunk Assessment Upper Extremity Assessment Upper Extremity Assessment: Generalized weakness   Lower Extremity Assessment Lower Extremity Assessment: Defer to PT evaluation       Communication Communication Communication: No difficulties   Cognition  Arousal/Alertness: Awake/alert Behavior During Therapy: Flat affect Overall Cognitive Status: Impaired/Different from baseline Area of Impairment: Safety/judgement;Following commands;Awareness;Problem solving;Memory;Attention                   Current Attention Level: Selective Memory: Decreased short-term memory Following Commands: Follows one step commands with increased time Safety/Judgement: Decreased awareness of safety Awareness: Emergent Problem Solving: Slow processing;Requires verbal cues General Comments: Pt with flat affect, slower processing but able to follow all directions and express needs   General Comments  HR stable in the 80s bpm during gait, pt with no reports of lightheadedness or "heart racing"    Exercises General Exercises - Lower Extremity Long Arc Quad: AROM;Both;5 reps;Seated Toe Raises: AROM;Both;5 reps;Seated Heel Raises: AROM;Both;5 reps;Seated   Shoulder Instructions      Home Living Family/patient expects to be discharged to:: Inpatient rehab Living Arrangements: Children Available Help at Discharge: Family;Friend(s);Available PRN/intermittently Type of Home: House Home Access: Stairs to enter [employer] of Steps: 2+1 Entrance Stairs-Rails: None Home Layout: Two level;Able to live on main level with bedroom/bathroom     Bathroom Shower/Tub: Chief Strategy Officer: Handicapped height Bathroom Accessibility: Yes How Accessible: Accessible via walker     Additional Comments: has been living with her dtr since pelvic fx ~ 8 mos ago  Lives With: Daughter    Prior Functioning/Environment Level of Independence: Needs assistance  Gait / Transfers Assistance Needed: Pt had been working with [employer] staff on ambulating with RW and performing WC mobility. ADL's / Homemaking Assistance Needed: Pt working with [employer] staff on ADL tasks.            OT Problem List: Decreased strength;Decreased activity tolerance;Impaired  balance (sitting and/or standing);Decreased cognition;Decreased safety awareness;Decreased knowledge of use of DME or AE      OT Treatment/Interventions: Self-care/ADL training;Balance training;Patient/family education;Cognitive remediation/compensation;Therapeutic activities;Therapeutic exercise;DME and/or AE instruction    OT Goals(Current goals can be found in the care plan section) Acute Rehab OT Goals Patient Stated Goal: to get better, back to CIR OT Goal Formulation: With patient/family Time For Goal Achievement: 08/21/20 Potential to Achieve Goals: Good  OT Frequency: Min 2X/week   Barriers to D/C:            Co-evaluation              AM-PAC OT "6 Clicks" Daily Activity     Outcome Measure Help from another person eating meals?: A Little Help from another person taking care of personal grooming?: A Little Help from another person toileting, which includes using toliet, bedpan, or urinal?: A Lot Help from another person bathing (including washing, rinsing, drying)?: A Lot Help from another person to put on and taking off regular upper body clothing?: A Little Help from another person to put on and taking off regular lower body clothing?: A Lot 6 Click Score: 15   End of Session Equipment Utilized During Treatment: Gait belt;Rolling walker  Activity Tolerance: Patient tolerated treatment well Patient left: in bed;with call bell/phone within reach;with bed alarm set  OT Visit Diagnosis: Other abnormalities of gait and mobility (  R26.89);Muscle weakness (generalized) (M62.81);Other symptoms and signs involving cognitive function;Unsteadiness on feet (R26.81)                Time: 6789-3810 OT Time Calculation (min): 13 min Charges:  OT General Charges $OT Visit: 1 Visit OT Evaluation $OT Eval Low Complexity: 1 Low  Bradd Canary, OTR/L Acute Rehab Services Office: [phone]   Lorre Munroe 08/07/2020, 12:46 PM

## 2020-08-07 NOTE — PMR Pre-admission (Signed)
PMR Admission Coordinator Pre-Admission Assessment  Patient: Sheila Andrade is an 85 y.o., female MRN: [id number] DOB: 1935-08-03 Height: 5' 7" (170.2 cm) Weight: 52.6 kg  Insurance Information HMO:     PPO:      PCP:      IPA:      80/20:      OTHER:  PRIMARY: [employer]      Policy#: [id number]      Subscriber: pt CM Name: Cora Collum      Phone#: [phone] Ext [phone]   Fax#: [phone] Pre-Cert#: [id number]  approved for 7 days    Employer:  Benefits:  Phone #: online     Name: 8/3 Eff. Date: 01/06/2019     Deduct: none      Out of Pocket Max: $4000      Life Max: none CIR: $160 co pay per day days 1 until 10      SNF: no copy days 1 until 20; $188 co pay ep days 21 until 41; no copay days 42 until; 100 Outpatient: $20 per visit     Co-Pay: visits per medical neccesity Home Health: 100%      Co-Pay: visits per medical neccesity DME: 80%     Co-Pay: 20% Providers: in network  SECONDARY: none      Policy#:      Phone#:   Development worker, community:       Phone#:   [employer] Engineer, [employer]" for patients in Inpatient Rehabilitation Facilities with attached "Privacy Act [place] Records" was provided and verbally reviewed with: Patient and Family  Emergency Contact Information Contact Information     Name Relation Home Work Mobile   [place] Daughter [phone]  [phone]   [name],[name] Daughter   [phone]   [name] Daughter   [phone]       Current Medical History  Patient Admitting Diagnosis: debility, failure to thrive  History of Present Illness:  85 year old female with history of HTN, fall with pelvic Fx 01/2020 with progressive decline, recent Covid infection complicated by urinary retention/UTI, abdominal pain w/anorexia and wt loss (negative EGD 7/22), severe recurrent depression who was admitted on 07/14/20 with confusion, falls, weakness and inability to stand. She was briefly started on Rocephin due to + UA and culture  w/Klebsiella Oxytoca/ornithia felt to be due to colonization therefore antibiotics d/c.   She also expressed severe depression with SI ideation and psychiatry consulted for input. She was also noted to have shuffling gait with tremors and freezing therefore neurology consulted for input on Parkinsonian features. Psychiatry and neurology has been following during hospitalization to help determine cause --Parkinsonism due to medication effect v/s severe depression v/s pseudodementia.  MRI C/T/L spine negative for compression or explanation of BLE weakness. Outpatient neurology work up with EMG/NCS and formal neurocognitive evaluation recommended by Dr. Quinn Axe. F Abilify was discontinued, Effexor slowly being tapered off, Trazodone added for sleep wake disruption with atarax tid prn for anxiety--ativan caused excessive sedation, Megace for appetite stimulant as well as recommendations for outpatient psych referral for further medication management. Dr. Lurline Hare w/psychiatry recommends lowering Prozac to 10 mg starting 07/30 "to see if it aided with memory issues" question causing memory issues.   [facility]. On 08/06/2020 she has  noted to have tachycardia when her vitals were taken. During that time her BP was low as 80/60. Previous history of near syncope multiple times. Readmitted to acute hospital on 08/06/2020 for SVT. Cardiology was consulted and started  on low dose metoprolol. Echo unremarkable. Lovenox for DVT prophylaxis. Educated patient and family on vagal maneuvers.  Patient's medical record [facility]  has been reviewed by the rehabilitation admission coordinator and physician.  Past Medical History  Past Medical History:  Diagnosis Date   Hot flashes 06/06/2020   Hypertension     Family History   family history includes Hypertension in an other family member.  Prior Rehab/Hospitalizations Has the patient had prior rehab or hospitalizations prior to  admission? Yes CIR 08/02/20 until 08/06/20 when readmitted to acute  Has the patient had major surgery during 100 days prior to admission? No   Current Medications  Current Facility-Administered Medications:    acetaminophen (TYLENOL) tablet 650 mg, 650 mg, Oral, Q6H PRN, 650 mg at 08/07/20 2013 **OR** acetaminophen (TYLENOL) suppository 650 mg, 650 mg, Rectal, Q6H PRN, [name], MD   brinzolamide (AZOPT) 1 % ophthalmic suspension 1 drop, 1 drop, Left Eye, BID, Hosie Poisson, MD, 1 drop at 08/08/20 2127   enoxaparin (LOVENOX) injection 40 mg, 40 mg, Subcutaneous, Daily, [name], MD, 40 mg at 08/08/20 4196   feeding supplement (ENSURE ENLIVE / ENSURE PLUS) liquid 237 mL, 237 mL, Oral, TID BM, Karleen Hampshire, Vijaya, MD, 237 mL at 08/08/20 1346   FLUoxetine (PROZAC) capsule 10 mg, 10 mg, Oral, Daily, Karleen Hampshire, Vijaya, MD, 10 mg at 08/08/20 0921   latanoprost (XALATAN) 0.005 % ophthalmic solution 1 drop, 1 drop, Left Eye, QHS, Hosie Poisson, MD, 1 drop at 08/08/20 2127   megestrol (MEGACE) 400 MG/10ML suspension 400 mg, 400 mg, Oral, Daily, Karleen Hampshire, Vijaya, MD, 400 mg at 08/08/20 2229   metoprolol tartrate (LOPRESSOR) tablet 12.5 mg, 12.5 mg, Oral, TID, Margie Billet, NP, 12.5 mg at 08/08/20 2126   multivitamin with minerals tablet 1 tablet, 1 tablet, Oral, Daily, Hosie Poisson, MD, 1 tablet at 08/08/20 7989   nystatin (MYCOSTATIN/NYSTOP) topical powder 1 application, 1 application, Topical, BID PRN, Hosie Poisson, MD   pantoprazole (PROTONIX) EC tablet 40 mg, 40 mg, Oral, Daily, Hosie Poisson, MD, 40 mg at 08/08/20 2119   polyethylene glycol (MIRALAX / GLYCOLAX) packet 17 g, 17 g, Oral, Daily PRN, Hosie Poisson, MD   simvastatin (ZOCOR) tablet 20 mg, 20 mg, Oral, Daily, Hosie Poisson, MD, 20 mg at 08/08/20 4174   traZODone (DESYREL) tablet 25 mg, 25 mg, Oral, QHS, Hosie Poisson, MD, 25 mg at 08/08/20 2126  Patients Current Diet:  Diet Order             Diet regular Room service appropriate?  Yes with Assist; Fluid consistency: Thin  Diet effective now                  Precautions / Restrictions Precautions Precautions: Fall Precaution Comments: near syncopal episodes prior to admission, posterior lean Restrictions Weight Bearing Restrictions: No   Has the patient had 2 or more falls or a fall with injury in the past year? Yes  Prior Activity Level Limited Community (1-2x/wk): limited to appointments only  Prior Functional Level Self Care: Did the patient need help bathing, dressing, using the toilet or eating? Needed some help  Indoor Mobility: Did the patient need assistance with walking from room to room (with or without device)? Needed some help  Stairs: Did the patient need assistance with internal or external stairs (with or without device)? Needed some help  Functional Cognition: Did the patient need help planning regular tasks such as shopping or remembering to take medications? Needed some help  Home Assistive Devices / Equipment Home Assistive Devices/Equipment: Gilford Rile (specify type)  Prior Device Use: Indicate devices/aids used by the patient prior to current illness, exacerbation or injury? Walker  Current Functional Level Cognition  Overall Cognitive Status: Impaired/Different from baseline Current Attention Level: Selective Orientation Level: Oriented X4 Following Commands: Follows one step commands with increased time Safety/Judgement: Decreased awareness of safety General Comments: Pt with flat affect, slower processing but able to follow all directions and express needs    Extremity Assessment (includes Sensation/Coordination)  Upper Extremity Assessment: Generalized weakness  Lower Extremity Assessment: Defer to PT evaluation    ADLs  Overall ADL's : Needs assistance/impaired Lower Body Dressing: Sit to/from stand, Moderate assistance Lower Body Dressing Details (indicate cue type and reason): With increased effort, pt able to demo  ability to cross LEs to reach tennis shoes. will need increased assist in standing due to posterior lean for sit to stand transitions Functional mobility during ADLs: Min guard, Rolling walker, Cueing for sequencing General ADL Comments: Pt requires increased assist for initial sit to stand transfers due to posterior lean but able to mobilize on min guard basis once up on feet. Slow pace and pt at increased risk for falls during ADLs.`    Mobility  Overal bed mobility: Needs Assistance Bed Mobility: Supine to Sit, Sit to Supine Supine to sit: Min assist, HOB elevated Sit to supine: Min guard General bed mobility comments: light Min A to scoot hips forward, use of bed rails. able to get B LE back into bed with min guard only    Transfers  Overall transfer level: Needs assistance Equipment used: Rolling walker (2 wheeled) Transfers: Sit to/from Stand Sit to Stand: Mod assist Stand pivot transfers: Mod assist, +2 physical assistance General transfer comment: Mod A for sit to stand transfers with posterior lean using RW. Guided pt in additional sit to stand trials x 3 with continued Mod A required    Ambulation / Gait / Stairs / Wheelchair Mobility  Ambulation/Gait Ambulation/Gait assistance: Min assist, +2 safety/equipment Gait Distance (Feet): 45 Feet (+35 ft, standing rest break to recover fatigue) Assistive device: Rolling walker (2 wheeled) Gait Pattern/deviations: Leaning posteriorly, Trunk flexed, Step-through pattern, Decreased stride length, Shuffle, Narrow base of support, Decreased dorsiflexion - right, Decreased dorsiflexion - left General Gait Details: min assist to steady, navigate pt/RW at times in cluttered hallway. Verbal cuing for "big steps", upright posture. Gait velocity: decr    Posture / Balance Dynamic Sitting Balance Sitting balance - Comments: able to sit unsupported, lean trunk multidirectionally without LOB Balance Overall balance assessment: Needs  assistance Sitting-balance support: Feet supported, No upper extremity supported Sitting balance-Leahy Scale: Good Sitting balance - Comments: able to sit unsupported, lean trunk multidirectionally without LOB Standing balance support: During functional activity, Bilateral upper extremity supported Standing balance-Leahy Scale: Poor Standing balance comment: reliant on external support    Special needs/care consideration    Previous Home Environment  Living Arrangements: Children (lives with daughter, Stanton Kidney)  Lives With: Daughter Available Help at Discharge: Family, Friend(s), Available PRN/intermittently, Available 24 hours/day (daughters and daughter in law provide 24/7) Type of Home: House Home Layout: Two level, Able to live on main level with bedroom/bathroom Home Access: Stairs to enter Entrance Stairs-Rails: None Entrance Stairs-Number of Steps: 2+1 Bathroom Shower/Tub: Chiropodist: Handicapped height Bathroom Accessibility: Yes How Accessible: Accessible via walker Home Care Services: No Additional Comments: has been living with her dtr since pelvic fx ~ 8 mos ago  Discharge Living  Setting Plans for Discharge Living Setting: House Type of Home at Discharge: House Discharge Home Layout: Two level, Able to live on main level with bedroom/bathroom Discharge Home Access: Stairs to enter Entrance Stairs-Rails: None Entrance Stairs-Number of Steps: 3 Discharge Bathroom Shower/Tub: Tub/shower unit Discharge Bathroom Toilet: Handicapped height Discharge Bathroom Accessibility: Yes How Accessible: Accessible via walker Does the patient have any problems obtaining your medications?: No  Social/Family/Support Systems Patient Roles: Parent Contact Information: daughter, Stanton Kidney Anticipated Caregiver: Family Anticipated Caregiver's Contact Information: see above Ability/Limitations of Caregiver: Children and family members work so will need to come up with a  plan for DC Caregiver Availability: Other (Comment) Discharge Plan Discussed with Primary Caregiver: Yes Is Caregiver In Agreement with Plan?: Yes Does Caregiver/Family have Issues with Lodging/Transportation while Pt is in Rehab?: No  Goals Patient/Family Goal for Rehab: superviison to min assist with PT and OT, supervision with SLP Expected length of stay: ELOS 10 to 14 days Pt/Family Agrees to Admission and willing to participate: Yes Program Orientation Provided & Reviewed with Pt/Caregiver Including Roles  & Responsibilities: Yes  Decrease burden of Care through IP rehab admission: n/a  Possible need for SNF placement upon discharge: not anticipated  Patient Condition: I have reviewed medical records from [facility] , spoken with patient and daughter. I met with patient at the bedside for inpatient rehabilitation assessment.  Patient will benefit from ongoing PT, OT, and SLP, can actively participate in 3 hours of therapy a day 5 days of the week, and can make measurable gains during the admission.  Patient will also benefit from the coordinated team approach during an Inpatient Acute Rehabilitation admission.  The patient will receive intensive therapy as well as Rehabilitation physician, nursing, social worker, and care management interventions.  Due to bladder management, bowel management, safety, skin/wound care, disease management, medication administration, pain management, and patient education the patient requires 24 hour a day rehabilitation nursing.  The patient is currently mod assist overall with mobility and basic ADLs.  Discharge setting and therapy post discharge at home with home health is anticipated.  Patient has agreed to participate in the Acute Inpatient Rehabilitation Program and will admit today.  Preadmission Screen Completed By:  Cleatrice Burke, 08/09/2020 9:21 AM ______________________________________________________________________   Discussed  status with Dr. Naaman Plummer on  08/09/2020 at 1028 and received approval for admission today.  Admission Coordinator:  Cleatrice Burke, RN, time  08/09/2020 Date 1028   Assessment/Plan: Diagnosis: debility, FTT Does the need for close, 24 hr/day Medical supervision in concert with the patient's rehab needs make it unreasonable for this patient to be served in a less intensive setting? Yes Co-Morbidities requiring supervision/potential complications: HTN, post-covid, severe depression, hx of pelvic fx Due to bladder management, bowel management, safety, skin/wound care, disease management, medication administration, pain management, and patient education, does the patient require 24 hr/day rehab nursing? Yes Does the patient require coordinated care of a physician, rehab nurse, PT, OT, and SLP to address physical and functional deficits in the context of the above medical diagnosis(es)? Yes Addressing deficits in the following areas: balance, endurance, locomotion, strength, transferring, bowel/bladder control, bathing, dressing, feeding, grooming, toileting, cognition, speech, language, swallowing, and psychosocial support Can the patient actively participate in an intensive therapy program of at least 3 hrs of therapy 5 days a week? Yes The potential for patient to make measurable gains while on inpatient rehab is excellent Anticipated functional outcomes upon discharge from inpatient rehab: supervision and min assist PT, supervision  and min assist OT, supervision SLP Estimated rehab length of stay to reach the above functional goals is: 10-14 days Anticipated discharge destination: Home 10. Overall Rehab/Functional Prognosis: excellent   MD Signature: Meredith Staggers, MD, [facility] 08/09/2020

## 2020-08-07 NOTE — Plan of Care (Signed)

## 2020-08-07 NOTE — Progress Notes (Signed)
Inpatient Rehabilitation Admissions Coordinator   I will begin insurance Auth with [facility] Medicare for a possible readmit pending their approval. I met with patient at bedside and will contact her daughter, Stanton Kidney, to discuss.  Danne Baxter, RN, MSN Rehab Admissions Coordinator [phone] 08/07/2020 2:00 PM

## 2020-08-07 NOTE — Progress Notes (Signed)
Physical Therapy Treatment Patient Details Name: Sheila Andrade MRN: [id number] DOB: June 20, 1935 Today's Date: 08/07/2020    History of Present Illness Pt is 84yo female admitted 8/2 to [facility] from CIR after tachycardic episode with SVTs and near syncopal episodes. Of note, prior admission on 7/10 for UTI, SI, and failure to thrive. PMH: recent COVID, glaucoma, osteoporosis, depression.    PT Comments    Pt remains motivated to progress mobility, expresses goal to walk in hallway today. Pt overall requiring min-mod assist to correct posterior leaning, provide steadying, and mod-max verbal cuing for safety during mobility. Pt ambulatory for x2 short hallway distances, requiring standing rest break to recover fatigue during mobility. HR stable <100 bpm during mobility, no s/s of lightheadedness or heart racing. PT to continue to recommend return to CIR, will continue to follow.     Follow Up Recommendations  CIR     Equipment Recommendations  Other (comment) (TBD, defer to post-acute rehb)    Recommendations for Other Services       Precautions / Restrictions Precautions Precautions: Fall Precaution Comments: near syncopal episodes prior to admission, posterior lean    Mobility  Bed Mobility Overal bed mobility: Needs Assistance             General bed mobility comments: pt sitting EOB upon PT arrival with daughter at bedside    Transfers Overall transfer level: Needs assistance Equipment used: Rolling walker (2 wheeled) Transfers: Sit to/from Stand Sit to Stand: Mod assist         General transfer comment: Mod assist for rise from EOB, correcting initial heavy posterior leaning, and steadying upon standing. Second sit<>stand from toilet with assist for slow eccentric lower onto commode and rise once finished. Verbal cuing for leaning trunk forward to correct heavy posterior leaning, hand placement when rising/sitting.  Ambulation/Gait Ambulation/Gait assistance: Min  assist;+2 safety/equipment Gait Distance (Feet): 45 Feet (+35 ft, standing rest break to recover fatigue) Assistive device: Rolling walker (2 wheeled) Gait Pattern/deviations: Leaning posteriorly;Trunk flexed;Step-through pattern;Decreased stride length;Shuffle;Narrow base of support;Decreased dorsiflexion - right;Decreased dorsiflexion - left Gait velocity: decr   General Gait Details: min assist to steady, navigate pt/RW at times in cluttered hallway. Verbal cuing for "big steps", upright posture.   Stairs             Wheelchair Mobility    Modified Rankin (Stroke Patients Only)       Balance Overall balance assessment: Needs assistance Sitting-balance support: Feet supported;No upper extremity supported Sitting balance-Leahy Scale: Good Sitting balance - Comments: able to sit unsupported, lean trunk multidirectionally without LOB   Standing balance support: During functional activity;Bilateral upper extremity supported Standing balance-Leahy Scale: Poor Standing balance comment: reliant on external support                            Cognition Arousal/Alertness: Awake/alert Behavior During Therapy: WFL for tasks assessed/performed Overall Cognitive Status: Impaired/Different from baseline Area of Impairment: Safety/judgement;Following commands;Awareness;Problem solving;Memory;Attention                   Current Attention Level: Selective Memory: Decreased short-term memory Following Commands: Follows one step commands with increased time Safety/Judgement: Decreased awareness of safety Awareness: Emergent Problem Solving: Slow processing;Requires verbal cues        Exercises General Exercises - Lower Extremity Long Arc Quad: AROM;Both;5 reps;Seated Toe Raises: AROM;Both;5 reps;Seated Heel Raises: AROM;Both;5 reps;Seated    General Comments General comments (skin integrity, edema, etc.): HR  stable in the 80s bpm during gait, pt with no  reports of lightheadedness or "heart racing"      Pertinent Vitals/Pain Pain Assessment: Faces Faces Pain Scale: No hurt Pain Intervention(s): Monitored during session    Home Living                      Prior Function            PT Goals (current goals can now be found in the care plan section) Acute Rehab PT Goals Patient Stated Goal: to get better, back to CIR PT Goal Formulation: With patient/family (daughter present) Time For Goal Achievement: 08/20/20 Potential to Achieve Goals: Good Progress towards PT goals: Progressing toward goals    Frequency    Min 3X/week      PT Plan Current plan remains appropriate    Co-evaluation              AM-PAC PT "6 Clicks" Mobility   Outcome Measure  Help needed turning from your back to your side while in a flat bed without using bedrails?: A Little Help needed moving from lying on your back to sitting on the side of a flat bed without using bedrails?: A Little Help needed moving to and from a bed to a chair (including a wheelchair)?: A Lot Help needed standing up from a chair using your arms (e.g., wheelchair or bedside chair)?: A Lot Help needed to walk in hospital room?: A Little Help needed climbing 3-5 steps with a railing? : Total 6 Click Score: 14    End of Session Equipment Utilized During Treatment: Gait belt Activity Tolerance: Patient tolerated treatment well Patient left: in chair;with call bell/phone within reach;with chair alarm set Nurse Communication: Mobility status PT Visit Diagnosis: Other abnormalities of gait and mobility (R26.89);Difficulty in walking, not elsewhere classified (R26.2);Unsteadiness on feet (R26.81);Muscle weakness (generalized) (M62.81)     Time: 0258-5277 PT Time Calculation (min) (ACUTE ONLY): 23 min  Charges:  $Gait Training: 8-22 mins $Therapeutic Activity: 8-22 mins                     Marye Round, PT DPT Acute Rehabilitation Services Pager [phone]  Office  [phone]    Tyrone Apple E Christain [place] 08/07/2020, 9:46 AM

## 2020-08-07 NOTE — Progress Notes (Signed)
Progress Note  Patient Name: Sheila Andrade Date of Encounter: 08/07/2020  Primary Cardiologist:   None   Subjective   Walked today.  She had SVT on monitor but did not feel this through the evening.  No pain or presyncope.  Inpatient Medications    Scheduled Meds:  brinzolamide  1 drop Left Eye BID   enoxaparin (LOVENOX) injection  40 mg Subcutaneous Daily   feeding supplement  237 mL Oral TID BM   FLUoxetine  10 mg Oral Daily   latanoprost  1 drop Left Eye QHS   megestrol  400 mg Oral Daily   metoprolol tartrate  12.5 mg Oral BID   multivitamin with minerals  1 tablet Oral Daily   pantoprazole  40 mg Oral Daily   simvastatin  20 mg Oral Daily   traZODone  25 mg Oral QHS   Continuous Infusions:  PRN Meds: acetaminophen **OR** acetaminophen, nystatin, polyethylene glycol   Vital Signs    Vitals:   08/07/20 0042 08/07/20 0415 08/07/20 0746 08/07/20 0936  BP: (!) 131/55 126/87 121/77 114/72  Pulse: 66 69 72 75  Resp: 18 18 17    Temp: 98.4 F (36.9 C) 98.9 F (37.2 C) 98.6 F (37 C)   TempSrc: Oral Oral Oral   SpO2: 100% 100% 100%   Weight: 53.1 kg     Height:        Intake/Output Summary (Last 24 hours) at 08/07/2020 0945 Last data filed at 08/07/2020 10/07/2020 Gross per 24 hour  Intake 720 ml  Output 1025 ml  Net -305 ml   Filed Weights   08/06/20 0200 08/07/20 0042  Weight: 51.2 kg 53.1 kg    Telemetry    NSR, PSVT - Personally Reviewed  ECG    NA - Personally Reviewed  Physical Exam   GEN: No acute distress.   Neck: No  JVD Cardiac: RRR, 2/6 apical brief systolic murmur, no diastolic murmurs, rubs, or gallops.  Respiratory: Clear  to auscultation bilaterally. GI: Soft, nontender, non-distended  MS: No  edema; No deformity. Neuro:  Nonfocal  Psych: Normal affect   Labs    Chemistry Recent Labs  Lab 08/03/20 0534 08/05/20 0515 08/06/20 0119  NA 134* 138  --   K 4.2 3.9 4.2  CL 106 108  --   CO2 21* 20*  --   GLUCOSE 96 92  --   BUN  31* 28*  --   CREATININE 0.73 0.87  --   CALCIUM 9.3 9.2  --   PROT 5.7*  --   --   ALBUMIN 2.9*  --   --   AST 16  --   --   ALT 16  --   --   ALKPHOS 45  --   --   BILITOT 0.5  --   --   GFRNONAA >60 >60  --   ANIONGAP 7 10  --      Hematology Recent Labs  Lab 08/03/20 0534 08/05/20 0515  WBC 8.3 6.9  RBC 3.95 4.13  HGB 11.4* 11.9*  HCT 35.0* 36.7  MCV 88.6 88.9  MCH 28.9 28.8  MCHC 32.6 32.4  RDW 14.7 14.8  PLT 391 391    Cardiac EnzymesNo results for input(s): TROPONINI in the last 168 hours. No results for input(s): TROPIPOC in the last 168 hours.   BNPNo results for input(s): BNP, PROBNP in the last 168 hours.   DDimer No results for input(s): DDIMER in the last 168 hours.  Radiology    No results found.  Cardiac Studies   NA  Patient Profile     85 y.o. female with a hx of HLD, HTN, iron def anemia multiple episodes of near syncope and PSVT and recent admit for failure to thrive was on Rehab (CIR) but last evening HR to 140 with hypotension who is being seen 08/06/2020 for the evaluation of SVT at the request of Dr. Blake Divine.  Assessment & Plan     SVT:    Runs through the evening self limited but with the longest being about 30 minutes.  I talked with her and her daughter about vagal maneuvers.  I will increase the metoprolol to qid and then consolidate tomorrow.  I don't think that these events should preclude a move back to rehab.  We can follow her there.   AS:  Mild.  Follow clinically.     HTN:  BP will allow med titration.    For questions or updates, please contact [facility] Please consult [email or web address] for contact info under Cardiology/STEMI.   Signed, Rollene Rotunda, MD  08/07/2020, 9:45 AM

## 2020-08-08 DIAGNOSIS — I350 Nonrheumatic aortic (valve) stenosis: Secondary | ICD-10-CM | POA: Diagnosis not present

## 2020-08-08 DIAGNOSIS — I10 Essential (primary) hypertension: Secondary | ICD-10-CM | POA: Diagnosis not present

## 2020-08-08 DIAGNOSIS — I471 Supraventricular tachycardia: Secondary | ICD-10-CM | POA: Diagnosis not present

## 2020-08-08 MED ORDER — METOPROLOL TARTRATE 12.5 MG HALF TABLET
12.5000 mg | ORAL_TABLET | Freq: Three times a day (TID) | ORAL | Status: DC
  Administered 2020-08-08 – 2020-08-09 (×3): 12.5 mg via ORAL
  Filled 2020-08-08 (×3): qty 1

## 2020-08-08 NOTE — Progress Notes (Addendum)
Progress Note  Patient Name: Sheila Andrade Date of Encounter: 08/08/2020  [facility] Cardiologist: New to Dr Antoine Poche   Subjective   Patient states she is doing well, she did not experience any symptoms from tachycardia last night, denied any chest pain, heart palpitation, dizziness, syncope, SOB.   Inpatient Medications    Scheduled Meds:  brinzolamide  1 drop Left Eye BID   enoxaparin (LOVENOX) injection  40 mg Subcutaneous Daily   feeding supplement  237 mL Oral TID BM   FLUoxetine  10 mg Oral Daily   latanoprost  1 drop Left Eye QHS   megestrol  400 mg Oral Daily   metoprolol tartrate  12.5 mg Oral TID   multivitamin with minerals  1 tablet Oral Daily   pantoprazole  40 mg Oral Daily   simvastatin  20 mg Oral Daily   traZODone  25 mg Oral QHS   Continuous Infusions:  PRN Meds: acetaminophen **OR** acetaminophen, nystatin, polyethylene glycol   Vital Signs    Vitals:   08/07/20 1627 08/07/20 1954 08/08/20 0337 08/08/20 0913  BP: (!) 107/55 111/73 137/70 102/65  Pulse: 66 69 61 70  Resp: 16  17   Temp: 98 F (36.7 C) 98.7 F (37.1 C) 98.8 F (37.1 C)   TempSrc: Oral Oral Oral   SpO2: 98% 100% 100%   Weight:   52.2 kg   Height:        Intake/Output Summary (Last 24 hours) at 08/08/2020 1140 Last data filed at 08/08/2020 0859 Gross per 24 hour  Intake 480 ml  Output 1100 ml  Net -620 ml   Last 3 Weights 08/08/2020 08/07/2020 08/06/2020  Weight (lbs) 115 lb 1.3 oz 117 lb 1 oz 112 lb 14 oz  Weight (kg) 52.2 kg 53.1 kg 51.2 kg      Telemetry    Sinus rhythm with rate of 60s this AM, noted throughout afternoon and evening, intermittent atrial tachycardia runs, longest lasting 20 minutes  - Personally Reviewed  ECG    N/A this AM - Personally Reviewed  Physical Exam   GEN: No acute distress.  Frail elderly  Neck: No JVD Cardiac: RRR, grade II systolic murmur RSB  Respiratory: Clear to auscultation bilaterally. On room air. Speaks full sentence.  GI:  Soft, nontender, non-distended  MS: No BLE edema; No deformity. Neuro:  Nonfocal  Psych: Normal affect   Labs    High Sensitivity Troponin:   Recent Labs  Lab 08/06/20 0452  TROPONINIHS 17      Chemistry Recent Labs  Lab 08/03/20 0534 08/05/20 0515 08/06/20 0119  NA 134* 138  --   K 4.2 3.9 4.2  CL 106 108  --   CO2 21* 20*  --   GLUCOSE 96 92  --   BUN 31* 28*  --   CREATININE 0.73 0.87  --   CALCIUM 9.3 9.2  --   PROT 5.7*  --   --   ALBUMIN 2.9*  --   --   AST 16  --   --   ALT 16  --   --   ALKPHOS 45  --   --   BILITOT 0.5  --   --   GFRNONAA >60 >60  --   ANIONGAP 7 10  --      Hematology Recent Labs  Lab 08/03/20 0534 08/05/20 0515  WBC 8.3 6.9  RBC 3.95 4.13  HGB 11.4* 11.9*  HCT 35.0* 36.7  MCV 88.6  88.9  MCH 28.9 28.8  MCHC 32.6 32.4  RDW 14.7 14.8  PLT 391 391    BNPNo results for input(s): BNP, PROBNP in the last 168 hours.   DDimer No results for input(s): DDIMER in the last 168 hours.   Radiology    No results found.  Cardiac Studies    Echo 06/14/20 with EF >55%, trivial AR, mild AS, at [facility]  Patient Profile     Sheila Andrade is a 85 y.o. female with a hx of HLD, HTN, iron deficiency anemia,  FTT, multiple episodes of near syncope and PSVT and recent admission for failure to thrive to CIR, cardiology is consulted on 08/06/2020 for the evaluation of SVT with rate of 130s and hypotension 80/60 occurred on 08/05/20 evening when standing.   Assessment & Plan    SVT associated hypotension - Occurred several episodes on 08/05/20 night, longest episode 30 min, self limiting ; + hx of near syncope without arrhthymias in the past; continue to have intermittent episodes although asymptomatic over the past 48 hours  - Echo from June 2022 unremarkable  - will increased  metoprolol to 12.5mg  TID today (monitor BP, low normal), educated family and patient on vagal maneuvers yesterday    Aortic stenosis , mild - follow up outpatient, no  acute CHF at this time   HTN - BP controlled on metoprolol   Depression FTT HLD GERD Anorexia  - addressed by IM    For questions or updates, please contact [facility] Please consult [email or web address] for contact info under      Signed, Cyndi Bender, NP  08/08/2020, 11:40 AM     History and all data above reviewed.  Patient examined.  I agree with the findings as above.  The patient denies any palpitations and has had no syncope/presyncope.  She denies pain.  The patient exam reveals COR:RRR, systolic murmur unchanged  ,  Lungs: Clear  ,  Abd: Positive bowel sounds, no rebound no guarding, Ext No edema  .  All available labs, radiology testing, previous records reviewed. Agree with documented assessment and plan.   SVT:  Increase beta blocker.  We will follow when transferred to CIR.   Fayrene Fearing Damyan Corne  11:51 AM  08/08/2020

## 2020-08-08 NOTE — Progress Notes (Signed)
PROGRESS NOTE    [name]  EHO:[id number] DOB: 1935/07/16 DOA: 08/06/2020 PCP: Dalphine Handing, MD    No chief complaint on file.   Brief Narrative:  Sheila Andrade is an 85 y.o. female who was now in rehab who was previously admitted 3 weeks ago for failure to thrive. She was admitted for evaluation for SVT, multiple episodes of near syncope and one episode of syncope associated with hypotension. Hypotension resolved.  She was started on low dose metoprolol. Cardiology consulted for recurrent episodes of near syncope.  Assessment & Plan:   Active Problems:   Tachycardia   SVT along with hypotension.  She was started on Metoprolol  12.5 mg BID. Dose increased to 12.5 TID, brief runs of tachycardia but remains asymptomatic.  Echo unremarkable.  Cardiology consulted and was educated on the vagal maneuvers.    Hyperlipidemia:  Resume zocor.    Depression:  Resume prozac.  Psychiatry consulted, recommending discontinuing the Effexor and to continue the prozac and outpatient follow up with psychiatry.    Poor appetite / failure to thrive:  Megace added.  Therapy eval recommending CIR.  Consult in place.   Mild normocytic anemia Hemoglobin around 11 and stable   DVT prophylaxis: (Lovenox. ) Code Status: Full code.  Family Communication: None at bedside.  Disposition:   Status is: Observation  The patient will require care spanning > 2 midnights and should be moved to inpatient because: Unsafe d/c plan  Dispo: The patient is from:  CIR              Anticipated d/c is to: CIR              Patient currently is not medically stable to d/c.   Difficult to place patient No       Consultants:  Cardiology Psychiatry.   Procedures: none.   Antimicrobials: none.    Subjective: No new complaints.   Objective: Vitals:   08/07/20 1627 08/07/20 1954 08/08/20 0337 08/08/20 0913  BP: (!) 107/55 111/73 137/70 102/65  Pulse: 66 69 61 70  Resp: 16  17    Temp: 98 F (36.7 C) 98.7 F (37.1 C) 98.8 F (37.1 C)   TempSrc: Oral Oral Oral   SpO2: 98% 100% 100%   Weight:   52.2 kg   Height:        Intake/Output Summary (Last 24 hours) at 08/08/2020 1146 Last data filed at 08/08/2020 0859 Gross per 24 hour  Intake 480 ml  Output 1100 ml  Net -620 ml    Filed Weights   08/06/20 0200 08/07/20 0042 08/08/20 0337  Weight: 51.2 kg 53.1 kg 52.2 kg    Examination:  General exam: Elderly woman not in any kind of distress Respiratory system: Clear to auscultation bilaterally, no wheezing or rhonchi Cardiovascular system: S1-S2 heard, regular rate rhythm, no JVD no pedal edema Gastrointestinal system: Abdomen is soft nontender bowel sounds normal Central nervous system: Alert and oriented, grossly nonfocal Extremities: No pedal edema Skin: No rashes seen  Psychiatry: Mood is appropriate    Data Reviewed: I have personally reviewed following labs and imaging studies  CBC: Recent Labs  Lab 08/03/20 0534 08/05/20 0515  WBC 8.3 6.9  NEUTROABS 4.7  --   HGB 11.4* 11.9*  HCT 35.0* 36.7  MCV 88.6 88.9  PLT 391 391     Basic Metabolic Panel: Recent Labs  Lab 08/03/20 0534 08/05/20 0515 08/06/20 0119  NA 134* 138  --   K  4.2 3.9 4.2  CL 106 108  --   CO2 21* 20*  --   GLUCOSE 96 92  --   BUN 31* 28*  --   CREATININE 0.73 0.87  --   CALCIUM 9.3 9.2  --   MG  --   --  2.2     GFR: Estimated Creatinine Clearance: 41.1 mL/min (by C-G formula based on SCr of 0.87 mg/dL).  Liver Function Tests: Recent Labs  Lab 08/03/20 0534  AST 16  ALT 16  ALKPHOS 45  BILITOT 0.5  PROT 5.7*  ALBUMIN 2.9*     CBG: No results for input(s): GLUCAP in the last 168 hours.   No results found for this or any previous visit (from the past 240 hour(s)).       Radiology Studies: No results found.      Scheduled Meds:  brinzolamide  1 drop Left Eye BID   enoxaparin (LOVENOX) injection  40 mg Subcutaneous Daily   feeding  supplement  237 mL Oral TID BM   FLUoxetine  10 mg Oral Daily   latanoprost  1 drop Left Eye QHS   megestrol  400 mg Oral Daily   metoprolol tartrate  12.5 mg Oral TID   multivitamin with minerals  1 tablet Oral Daily   pantoprazole  40 mg Oral Daily   simvastatin  20 mg Oral Daily   traZODone  25 mg Oral QHS   Continuous Infusions:   LOS: 0 days        Kathlen Mody, MD [facility]   To contact the attending provider between 7A-7P or the covering provider during after hours 7P-7A, please log into the web site [email or web address] and access using universal [place] password for that web site. If you do not have the password, please call the hospital operator.  08/08/2020, 11:46 AM

## 2020-08-08 NOTE — Discharge Summary (Signed)
Physician Discharge Summary  Patient ID: Sheila Andrade MRN: [id number] DOB/AGE: 1935/04/02 85 y.o.  Admit date: 08/02/2020 Discharge date: 08/06/2020  Discharge Diagnoses:  Principal Problem:   Paroxysmal SVT (supraventricular tachycardia) (HCC) Active Problems:   Debility   Pressure injury of skin   Failure to thrive in adult   Urinary retention   Hyperlipidemia   Hypertension   Primary open angle glaucoma   Discharged Condition: serious  Significant Diagnostic Studies: N/A   Labs:  Basic Metabolic Panel: Recent Labs  Lab 08/03/20 0534 08/05/20 0515  NA 134* 138  K 4.2 3.9  CL 106 108  CO2 21* 20*  GLUCOSE 96 92  BUN 31* 28*  CREATININE 0.73 0.87  CALCIUM 9.3 9.2  MG  --   --     CBC: Recent Labs  Lab 08/03/20 0534 08/05/20 0515  WBC 8.3 6.9  NEUTROABS 4.7  --   HGB 11.4* 11.9*  HCT 35.0* 36.7  MCV 88.6 88.9  PLT 391 391    CBG: No results for input(s): GLUCAP in the last 168 hours.  Brief HPI:   Sheila Andrade is a 85 y.o. female with history of HTN, pelvic fracture 1/22 with progressive decline complicated by COVID infection, anorexia with weight loss, severe recurrent depression, recurrent UTIs who was originally admitted to [facility] on 07/14/2020 with falls, shuffling gait with parkinsonian type symptoms as well as suicidal ideation due to severe depression and failure to thrive.  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  Neurology also recommended outpatient nerve conduction study/EMG to evaluate BLE weakness.  Patient was weaned off Effexor and transition to Prozac.  Vitamin B12 was supplemented and Megace was added to help improve appetite.  CIR was recommended due to functional deficits from debility.     Hospital Course: Chloie Loney was admitted to rehab 08/02/2020 for inpatient therapies to consist of PT, ST and OT at least three hours five  days a week. Past admission physiatrist, therapy team and rehab RN have worked together to provide customized collaborative inpatient rehab.  Admission labs showed mild hyponatremia and H&H to be relatively stable.  Therapy evaluations were completed revealing that patient required max assist with mobility and mod to max assist with basic ADL tasks.  P.o. intake was noted to be improving.  She was noted to have problems with urinary retention requiring intermittent catheterization.  On 08/01  PM, patient was noted to be tachycardic with heart rates up to 140 as well as hypotension on routine check of vitals.  She was treated with fluid bolus but continued to have episodic SVT .  Dr. Shawnie Pons with [facility] was consulted for input and patient was transferred to telemetry for work-up and closer monitoring.     Medications at discharge: Azopt 1% 1 GTT OS twice daily Cholecalciferol 1000 units p.o. per day Lovenox 40 mg subcu daily Ensure Plus supplements 3 times daily between meals Prozac 10 mg p.o. per day Xalatan 0.005% 1 GTT OU at bedtime Measures 400 mg p.o. per day Multivitamin 1 p.o. per day Clear eyedrops to GGT 3 times daily AC at bedtime Zocor 20 mg p.o. per day MiraLAX 17 g p.o. as needed Trazodone 25 mg p.o. nightly  Diet: Regular.   Disposition: Acute hospital for monitoring    Signed: Jacquelynn Cree 08/13/2020, 11:14 PM

## 2020-08-08 NOTE — Progress Notes (Signed)
Inpatient Rehabilitation Admissions Coordinator   [facility] Medicare has approved readmit to CIR. Bed is available on Friday. I contacted pt's daughter by phone and she is aware and in agreement. I will alert acute team and TOC. I will follow up tomorrow.  Ottie Glazier, RN, MSN Rehab Admissions Coordinator [phone] 08/08/2020 2:25 PM

## 2020-08-09 ENCOUNTER — Other Ambulatory Visit: Payer: Self-pay

## 2020-08-09 ENCOUNTER — Inpatient Hospital Stay (HOSPITAL_COMMUNITY)
Admission: RE | Admit: 2020-08-09 | Discharge: 2020-08-23 | DRG: 945 | Disposition: A | Discharge status: Home Health | Payer: Medicare PPO | Source: Intra-hospital | Attending: Physical Medicine and Rehabilitation | Admitting: Physical Medicine and Rehabilitation

## 2020-08-09 ENCOUNTER — Encounter (HOSPITAL_COMMUNITY): Payer: Self-pay | Admitting: Physical Medicine and Rehabilitation

## 2020-08-09 DIAGNOSIS — H409 Unspecified glaucoma: Secondary | ICD-10-CM | POA: Diagnosis present

## 2020-08-09 DIAGNOSIS — I959 Hypotension, unspecified: Secondary | ICD-10-CM | POA: Diagnosis not present

## 2020-08-09 DIAGNOSIS — Z9181 History of falling: Secondary | ICD-10-CM

## 2020-08-09 DIAGNOSIS — F419 Anxiety disorder, unspecified: Secondary | ICD-10-CM | POA: Diagnosis present

## 2020-08-09 DIAGNOSIS — E440 Moderate protein-calorie malnutrition: Secondary | ICD-10-CM | POA: Diagnosis present

## 2020-08-09 DIAGNOSIS — R5381 Other malaise: Principal | ICD-10-CM | POA: Diagnosis present

## 2020-08-09 DIAGNOSIS — E8809 Other disorders of plasma-protein metabolism, not elsewhere classified: Secondary | ICD-10-CM | POA: Diagnosis present

## 2020-08-09 DIAGNOSIS — F332 Major depressive disorder, recurrent severe without psychotic features: Secondary | ICD-10-CM | POA: Diagnosis present

## 2020-08-09 DIAGNOSIS — K5900 Constipation, unspecified: Secondary | ICD-10-CM | POA: Diagnosis present

## 2020-08-09 DIAGNOSIS — R32 Unspecified urinary incontinence: Secondary | ICD-10-CM | POA: Diagnosis present

## 2020-08-09 DIAGNOSIS — L89321 Pressure ulcer of left buttock, stage 1: Secondary | ICD-10-CM | POA: Diagnosis present

## 2020-08-09 DIAGNOSIS — E46 Unspecified protein-calorie malnutrition: Secondary | ICD-10-CM | POA: Diagnosis not present

## 2020-08-09 DIAGNOSIS — D62 Acute posthemorrhagic anemia: Secondary | ICD-10-CM | POA: Diagnosis present

## 2020-08-09 DIAGNOSIS — Z79899 Other long term (current) drug therapy: Secondary | ICD-10-CM

## 2020-08-09 DIAGNOSIS — K219 Gastro-esophageal reflux disease without esophagitis: Secondary | ICD-10-CM | POA: Diagnosis present

## 2020-08-09 DIAGNOSIS — D509 Iron deficiency anemia, unspecified: Secondary | ICD-10-CM | POA: Diagnosis present

## 2020-08-09 DIAGNOSIS — R2689 Other abnormalities of gait and mobility: Secondary | ICD-10-CM | POA: Diagnosis present

## 2020-08-09 DIAGNOSIS — I471 Supraventricular tachycardia: Secondary | ICD-10-CM | POA: Diagnosis present

## 2020-08-09 DIAGNOSIS — R627 Adult failure to thrive: Secondary | ICD-10-CM | POA: Diagnosis present

## 2020-08-09 DIAGNOSIS — D649 Anemia, unspecified: Secondary | ICD-10-CM | POA: Diagnosis not present

## 2020-08-09 DIAGNOSIS — R7989 Other specified abnormal findings of blood chemistry: Secondary | ICD-10-CM | POA: Diagnosis present

## 2020-08-09 DIAGNOSIS — Z8679 Personal history of other diseases of the circulatory system: Secondary | ICD-10-CM

## 2020-08-09 DIAGNOSIS — Z681 Body mass index (BMI) 19 or less, adult: Secondary | ICD-10-CM | POA: Diagnosis not present

## 2020-08-09 DIAGNOSIS — E785 Hyperlipidemia, unspecified: Secondary | ICD-10-CM | POA: Diagnosis present

## 2020-08-09 DIAGNOSIS — E538 Deficiency of other specified B group vitamins: Secondary | ICD-10-CM | POA: Diagnosis present

## 2020-08-09 DIAGNOSIS — E43 Unspecified severe protein-calorie malnutrition: Secondary | ICD-10-CM

## 2020-08-09 DIAGNOSIS — G20 Parkinson's disease: Secondary | ICD-10-CM | POA: Diagnosis present

## 2020-08-09 DIAGNOSIS — R269 Unspecified abnormalities of gait and mobility: Secondary | ICD-10-CM | POA: Diagnosis present

## 2020-08-09 DIAGNOSIS — F322 Major depressive disorder, single episode, severe without psychotic features: Secondary | ICD-10-CM

## 2020-08-09 DIAGNOSIS — R64 Cachexia: Secondary | ICD-10-CM | POA: Diagnosis present

## 2020-08-09 DIAGNOSIS — Z8616 Personal history of COVID-19: Secondary | ICD-10-CM

## 2020-08-09 DIAGNOSIS — Z8249 Family history of ischemic heart disease and other diseases of the circulatory system: Secondary | ICD-10-CM

## 2020-08-09 DIAGNOSIS — I350 Nonrheumatic aortic (valve) stenosis: Secondary | ICD-10-CM | POA: Diagnosis not present

## 2020-08-09 DIAGNOSIS — I10 Essential (primary) hypertension: Secondary | ICD-10-CM | POA: Diagnosis present

## 2020-08-09 DIAGNOSIS — R41841 Cognitive communication deficit: Secondary | ICD-10-CM | POA: Diagnosis present

## 2020-08-09 DIAGNOSIS — Z87440 Personal history of urinary (tract) infections: Secondary | ICD-10-CM

## 2020-08-09 MED ORDER — METOPROLOL TARTRATE 12.5 MG HALF TABLET
12.5000 mg | ORAL_TABLET | Freq: Three times a day (TID) | ORAL | Status: DC
  Administered 2020-08-09 – 2020-08-11 (×5): 12.5 mg via ORAL
  Filled 2020-08-09 (×5): qty 1

## 2020-08-09 MED ORDER — LATANOPROST 0.005 % OP SOLN
1.0000 [drp] | Freq: Every day | OPHTHALMIC | Status: DC
  Administered 2020-08-09 – 2020-08-22 (×14): 1 [drp] via OPHTHALMIC
  Filled 2020-08-09: qty 2.5

## 2020-08-09 MED ORDER — TRAZODONE HCL 50 MG PO TABS
25.0000 mg | ORAL_TABLET | Freq: Every day | ORAL | Status: DC
  Administered 2020-08-09 – 2020-08-22 (×14): 25 mg via ORAL
  Filled 2020-08-09 (×14): qty 1

## 2020-08-09 MED ORDER — BRINZOLAMIDE 1 % OP SUSP
1.0000 [drp] | Freq: Two times a day (BID) | OPHTHALMIC | Status: DC
  Administered 2020-08-09 – 2020-08-23 (×28): 1 [drp] via OPHTHALMIC
  Filled 2020-08-09: qty 10

## 2020-08-09 MED ORDER — METOPROLOL TARTRATE 25 MG PO TABS: 12.5000 mg | ORAL_TABLET | Freq: Three times a day (TID) | ORAL | Status: DC

## 2020-08-09 MED ORDER — PROCHLORPERAZINE EDISYLATE 10 MG/2ML IJ SOLN: 5.0000 mg | Freq: Four times a day (QID) | INTRAMUSCULAR | Status: DC | PRN

## 2020-08-09 MED ORDER — BISACODYL 10 MG RE SUPP: 10.0000 mg | Freq: Every day | RECTAL | Status: DC | PRN

## 2020-08-09 MED ORDER — ENSURE ENLIVE PO LIQD
237.0000 mL | Freq: Three times a day (TID) | ORAL | Status: DC
  Administered 2020-08-09 – 2020-08-22 (×38): 237 mL via ORAL
  Filled 2020-08-09: qty 237

## 2020-08-09 MED ORDER — ENOXAPARIN SODIUM 40 MG/0.4ML IJ SOSY
40.0000 mg | PREFILLED_SYRINGE | Freq: Every day | INTRAMUSCULAR | Status: DC
Start: 2020-08-10 — End: 2020-08-23
  Administered 2020-08-10 – 2020-08-23 (×14): 40 mg via SUBCUTANEOUS
  Filled 2020-08-09 (×16): qty 0.4

## 2020-08-09 MED ORDER — GUAIFENESIN-DM 100-10 MG/5ML PO SYRP: 5.0000 mL | ORAL_SOLUTION | Freq: Four times a day (QID) | ORAL | Status: DC | PRN

## 2020-08-09 MED ORDER — MEGESTROL ACETATE 400 MG/10ML PO SUSP
400.0000 mg | Freq: Every day | ORAL | Status: DC
  Administered 2020-08-10 – 2020-08-19 (×10): 400 mg via ORAL
  Filled 2020-08-09 (×10): qty 10

## 2020-08-09 MED ORDER — SIMVASTATIN 20 MG PO TABS
20.0000 mg | ORAL_TABLET | Freq: Every day | ORAL | Status: DC
  Administered 2020-08-10 – 2020-08-23 (×14): 20 mg via ORAL
  Filled 2020-08-09 (×14): qty 1

## 2020-08-09 MED ORDER — PANTOPRAZOLE SODIUM 40 MG PO TBEC
40.0000 mg | DELAYED_RELEASE_TABLET | Freq: Every day | ORAL | Status: DC
  Administered 2020-08-10 – 2020-08-23 (×14): 40 mg via ORAL
  Filled 2020-08-09 (×13): qty 1

## 2020-08-09 MED ORDER — FLEET ENEMA 7-19 GM/118ML RE ENEM: 1.0000 | ENEMA | Freq: Once | RECTAL | Status: DC | PRN

## 2020-08-09 MED ORDER — ACETAMINOPHEN 325 MG PO TABS: 325.0000 mg | ORAL_TABLET | ORAL | Status: DC | PRN

## 2020-08-09 MED ORDER — DIPHENHYDRAMINE HCL 12.5 MG/5ML PO ELIX: 12.5000 mg | ORAL_SOLUTION | Freq: Four times a day (QID) | ORAL | Status: DC | PRN

## 2020-08-09 MED ORDER — FLUOXETINE HCL 10 MG PO CAPS
10.0000 mg | ORAL_CAPSULE | Freq: Every day | ORAL | Status: DC
  Administered 2020-08-10 – 2020-08-12 (×3): 10 mg via ORAL
  Filled 2020-08-09 (×3): qty 1

## 2020-08-09 MED ORDER — ADULT MULTIVITAMIN W/MINERALS CH
1.0000 | ORAL_TABLET | Freq: Every day | ORAL | Status: DC
  Administered 2020-08-10 – 2020-08-23 (×14): 1 via ORAL
  Filled 2020-08-09 (×17): qty 1

## 2020-08-09 MED ORDER — ALUM & MAG HYDROXIDE-SIMETH 200-200-20 MG/5ML PO SUSP: 30.0000 mL | ORAL | Status: DC | PRN

## 2020-08-09 MED ORDER — PROCHLORPERAZINE 25 MG RE SUPP: 12.5000 mg | Freq: Four times a day (QID) | RECTAL | Status: DC | PRN

## 2020-08-09 MED ORDER — POLYETHYLENE GLYCOL 3350 17 G PO PACK: 17.0000 g | PACK | Freq: Every day | ORAL | Status: DC | PRN

## 2020-08-09 MED ORDER — PROCHLORPERAZINE MALEATE 5 MG PO TABS: 5.0000 mg | ORAL_TABLET | Freq: Four times a day (QID) | ORAL | Status: DC | PRN

## 2020-08-09 NOTE — Progress Notes (Signed)
Initial Nutrition Assessment  DOCUMENTATION CODES:   Not applicable  INTERVENTION:  Provide Ensure Enlive po TID, each supplement provides 350 kcal and 20 grams of protein.  Encourage adequate PO intake.  NUTRITION DIAGNOSIS:   Increased nutrient needs related to  (therapy) as evidenced by estimated needs.  GOAL:   Patient will meet greater than or equal to 90% of their needs  MONITOR:   PO intake, Supplement acceptance, Skin, Weight trends, Labs, I & O's  REASON FOR ASSESSMENT:   Malnutrition Screening Tool    ASSESSMENT:   85 year old female with history of HTN, pelvic fx 01/2020 with progressive decline complicated by Covid infection, anorexia with wt loss, severe recurrent depression, recurrent UTIs who was originally admitted to [facility] on 07/14/20 with falls, shuffling gait with parkinsonian type symptoms, SI due to severe depression and FTT. She was admitted to CIR on 07/29 due to functional deficits from debility.  Pt unavailable during attempted time of contact. RD unable to obtain most recent nutrition history. Pt currently has Ensure ordered TID. RD to continue with current orders to aid in caloric and protein needs. Unable to complete Nutrition-Focused physical exam at this time.   Labs and medications reviewed.   Diet Order:   Diet Order             Diet regular Room service appropriate? Yes; Fluid consistency: Thin  Diet effective now                   EDUCATION NEEDS:   Not appropriate for education at this time  Skin:  Skin Assessment: Skin Integrity Issues: Skin Integrity Issues:: Stage I Stage I: buttocks  Last BM:  8/4  Height:   Ht Readings from Last 1 Encounters:  08/09/20 5\' 7"  (1.702 m)    Weight:   Wt Readings from Last 1 Encounters:  08/09/20 55.1 kg   BMI:  Body mass index is 19.03 kg/m.  Estimated Nutritional Needs:   Kcal:  1600-1800  Protein:  80-95 grams  Fluid:  > 1.6 L/day  10/09/20, MS, RD, LDN RD  pager number/after hours weekend pager number on Amion.

## 2020-08-09 NOTE — Discharge Summary (Addendum)
Physician Discharge Summary  Sheila Andrade MHD:[id number] DOB: [birth year] DOA: 08/06/2020  PCP: Dalphine Handing, MD  Admit date: 08/06/2020 Discharge date: 08/09/2020  Admitted From: CIR Disposition: CIR    Recommendations for Outpatient Follow-up:  Follow up with PCP in 1-2 weeks Started on metoprolol tartrate 12.5 mg p.o. 3 times daily Cardiology to continue to follow on discharge to CIR  Discharge Condition: Stable CODE STATUS: Full code Diet recommendation: Heart healthy diet  History of present illness:  Sheila Andrade is an 85 year old female with past medical history significant for aortic stenosis, essential hypertension, depression, GERD, hyperlipidemia, adult failure to thrive who presents from CIR with rapid heart rate secondary to paroxysmal SVT.  Patient reports that she was in her usual state of health and on upon getting her vital signs checked they noted tachycardia with heart rate of 140.  At that time her blood pressure was low at 80/60 with improvement of her blood pressure while lying supine.  Rhythm lasted approximately 10-15 minutes and spontaneously converted.  Patient was transferred back to [facility] for further evaluation and treatment with cardiology consultation for SVT.  Hospital course:  SVT with associated hypotension Patient represented to [facility] from [facility] with several episodes of SVT on 08/05/2020, longest episode lasting 30 minutes with self resolution.  No previous history of arrhythmias in the past but reports near syncopal episodes.  Patient with a TTE in June 2022 which was unremarkable.  Cardiology was consulted and followed during hospital course.  Patient was started on metoprolol tartrate 12.5 mg p.o. 3 times daily with improvement of BP.  No further significant arrhythmias noted and will be discharging back to CIR today.  Cardiology will continue to follow.  Aortic stenosis, mild Outpatient follow-up with cardiology  Essential  hypertension Continue metoprolol as above.  Depression/anxiety. Psychiatry was consulted and recommending discontinuing Effexor.  Continue Prozac 10 mg p.o. daily.  Consider increasing Prozac to 20 mg p.o. daily, but patient currently declines increased dose.  Continue hydroxyzine as needed for anxiety.  Hyperlipidemia: Simvastatin 20 mg p.o. daily  GERD: Omeprazole 40 mg p.o. daily  Normocytic anemia Hemoglobin stable, 11.  Adult failure to thrive: Continue Megace and therapy at CIR.   Discharge Diagnoses:  Active Problems:   Anemia   Debility   Failure to thrive in adult   Hyperlipidemia   Hypertension    Discharge Instructions  Discharge Instructions     Diet - low sodium heart healthy   Complete by: As directed    Increase activity slowly   Complete by: As directed    No wound care   Complete by: As directed       Allergies as of 08/09/2020   No Known Allergies      Medication List     STOP taking these medications    venlafaxine XR 37.5 MG 24 hr capsule Commonly known as: EFFEXOR-XR       TAKE these medications    acetaminophen 500 MG tablet Commonly known as: TYLENOL Take 1,000 mg by mouth every 6 (six) hours as needed for mild pain or headache.   bimatoprost 0.01 % Soln Commonly known as: LUMIGAN Place 1 drop into the left eye at bedtime.   brinzolamide 1 % ophthalmic suspension Commonly known as: AZOPT Place 1 drop into the left eye 2 (two) times daily.   cholecalciferol 25 MCG (1000 UNIT) tablet Commonly known as: VITAMIN D Take 1,000 Units by mouth daily.   feeding supplement Liqd  Take 237 mLs by mouth 3 (three) times daily between meals.   FLUoxetine 10 MG capsule Commonly known as: PROZAC Take 1 capsule (10 mg total) by mouth daily.   hydrOXYzine 25 MG tablet Commonly known as: ATARAX/VISTARIL Take 1 tablet (25 mg total) by mouth 3 (three) times daily as needed for anxiety.   megestrol 400 MG/10ML suspension Commonly known  as: MEGACE Take 10 mLs (400 mg total) by mouth daily.   metoprolol tartrate 25 MG tablet Commonly known as: LOPRESSOR Take 0.5 tablets (12.5 mg total) by mouth 3 (three) times daily.   multivitamin with minerals tablet Take 1 tablet by mouth daily.   multivitamin with minerals Tabs tablet Take 1 tablet by mouth daily.   nystatin powder Commonly known as: MYCOSTATIN/NYSTOP Apply 1 application topically 2 (two) times daily as needed (rash under arm).   omeprazole 40 MG capsule Commonly known as: PRILOSEC Take 40 mg by mouth daily as needed (heartburn).   ondansetron 4 MG disintegrating tablet Commonly known as: ZOFRAN-ODT Take 4 mg by mouth every 8 (eight) hours as needed for nausea/vomiting.   polyethylene glycol 17 g packet Commonly known as: MIRALAX / GLYCOLAX Take 17 g by mouth daily as needed for mild constipation.   simvastatin 20 MG tablet Commonly known as: ZOCOR Take 20 mg by mouth daily.   traZODone 50 MG tablet Commonly known as: DESYREL Take 0.5 tablets (25 mg total) by mouth at bedtime.        Follow-up Information     Dalphine Handing, MD. Schedule an appointment as soon as possible for a visit in 1 week(s).   Specialty: Internal Medicine Contact information: [address] [place]. [place] [address] [phone]                No Known Allergies  Consultations: Cardiology, Dr. Antoine Poche   Procedures/Studies: DG Chest 2 View  Result Date: 07/14/2020 CLINICAL DATA:  Failure to thrive, weight loss EXAM: CHEST - 2 VIEW COMPARISON:  Radiograph 05/31/2020 FINDINGS: Suspect a small calcified mediastinal lymph node seen on lateral radiograph. Remaining cardiomediastinal contours are unremarkable. Bandlike opacity in the right lung base, likely scarring given similar to prior. No consolidation, features of edema, pneumothorax, or effusion. Degenerative changes are present in the imaged spine and shoulders. Evidence of calcific  tendinosis in the left shoulder. Left cervical carotid atherosclerosis. IMPRESSION: Suspect a small calcified mediastinal/hilar node. Can reflect sequela of prior granulomatous disease. No acute cardiopulmonary abnormality. Electronically Signed   By: Kreg Shropshire M.D.   On: 07/14/2020 20:56   MR BRAIN WO CONTRAST  Result Date: 07/14/2020 CLINICAL DATA:  Mental status change. EXAM: MRI HEAD WITHOUT CONTRAST TECHNIQUE: Multiplanar, multiecho pulse sequences of the brain and surrounding structures were obtained without intravenous contrast. COMPARISON:  None. FINDINGS: Brain: No acute infarction, acute hemorrhage, hydrocephalus, extra-axial collection or mass lesion. Moderate scattered T2/FLAIR hyperintensities within the white matter, which are nonspecific but most likely related to chronic microvascular ischemic disease given patient age. Mild-to-moderate atrophy with ex vacuo ventricular dilation. The callosal angle is not acute and there is no crowding of sulci at the vertex. Dilated perivascular space in the inferior left basal ganglia. Small focus of susceptibility artifact in the lateral left cerebellum, most likely a prior microhemorrhage. Vascular: Major arterial flow voids are maintained skull base. Skull and upper cervical spine: Normal marrow signal. Sinuses/Orbits: Visualized sinuses are clear. No acute orbital findings. Other: Small left mastoid effusion. IMPRESSION: 1. No evidence of acute  intracranial abnormality 2. Moderate chronic microvascular ischemic disease and mild-to-moderate atrophy. 3. Small left mastoid effusion. Electronically Signed   By: Feliberto HartsFrederick S Jones MD   On: 07/14/2020 20:11   MR CERVICAL SPINE W WO CONTRAST  Result Date: 07/22/2020 CLINICAL DATA:  Hyper-reflexia and abnormal gait EXAM: MRI CERVICAL AND THORACIC SPINE WITHOUT AND WITH CONTRAST TECHNIQUE: Multiplanar and multiecho pulse sequences of the cervical spine, to include the craniocervical junction and  cervicothoracic junction, and the thoracic spine, were obtained without and with intravenous contrast. CONTRAST:  5mL GADAVIST GADOBUTROL 1 MMOL/ML IV SOLN COMPARISON:  None. FINDINGS: MRI CERVICAL SPINE FINDINGS Alignment: Physiologic. Vertebrae: No fracture, evidence of discitis, or bone lesion. Cord: Normal signal and morphology. Posterior Fossa, vertebral arteries, paraspinal tissues: Negative. Disc levels: Axial images are severely motion degraded. C1-2: Unremarkable. C2-3: Normal disc space and facet joints. There is no spinal canal stenosis. No neural foraminal stenosis. C3-4: Normal disc space and facet joints. There is no spinal canal stenosis. No neural foraminal stenosis. C4-5: Normal disc space and facet joints. There is no spinal canal stenosis. No neural foraminal stenosis. C5-6: Small left asymmetric disc bulge. There is no spinal canal stenosis. Mild left neural foraminal stenosis. C6-7: Left asymmetric disc bulge with uncovertebral spurring. There is no spinal canal stenosis. Moderate left neural foraminal stenosis. C7-T1: Normal disc space and facet joints. There is no spinal canal stenosis. No neural foraminal stenosis. MRI THORACIC SPINE FINDINGS Alignment:  Physiologic. Vertebrae: No fracture, evidence of discitis, or bone lesion. Cord:  Normal signal and morphology. Paraspinal and other soft tissues: Negative. Disc levels: Severely motion degraded axial images. No spinal canal stenosis. There is a small central disc protrusion noted at T7-8. No abnormal contrast enhancement. IMPRESSION: 1. Motion degraded study. 2. No acute abnormality of the cervical or thoracic spine. 3. Moderate left C6-7 and mild left C5-6 neural foraminal stenosis. 4. No spinal canal stenosis. Electronically Signed   By: Deatra RobinsonKevin  Herman M.D.   On: 07/22/2020 21:07   MR THORACIC SPINE W WO CONTRAST  Result Date: 07/22/2020 CLINICAL DATA:  Hyper-reflexia and abnormal gait EXAM: MRI CERVICAL AND THORACIC SPINE WITHOUT AND  WITH CONTRAST TECHNIQUE: Multiplanar and multiecho pulse sequences of the cervical spine, to include the craniocervical junction and cervicothoracic junction, and the thoracic spine, were obtained without and with intravenous contrast. CONTRAST:  5mL GADAVIST GADOBUTROL 1 MMOL/ML IV SOLN COMPARISON:  None. FINDINGS: MRI CERVICAL SPINE FINDINGS Alignment: Physiologic. Vertebrae: No fracture, evidence of discitis, or bone lesion. Cord: Normal signal and morphology. Posterior Fossa, vertebral arteries, paraspinal tissues: Negative. Disc levels: Axial images are severely motion degraded. C1-2: Unremarkable. C2-3: Normal disc space and facet joints. There is no spinal canal stenosis. No neural foraminal stenosis. C3-4: Normal disc space and facet joints. There is no spinal canal stenosis. No neural foraminal stenosis. C4-5: Normal disc space and facet joints. There is no spinal canal stenosis. No neural foraminal stenosis. C5-6: Small left asymmetric disc bulge. There is no spinal canal stenosis. Mild left neural foraminal stenosis. C6-7: Left asymmetric disc bulge with uncovertebral spurring. There is no spinal canal stenosis. Moderate left neural foraminal stenosis. C7-T1: Normal disc space and facet joints. There is no spinal canal stenosis. No neural foraminal stenosis. MRI THORACIC SPINE FINDINGS Alignment:  Physiologic. Vertebrae: No fracture, evidence of discitis, or bone lesion. Cord:  Normal signal and morphology. Paraspinal and other soft tissues: Negative. Disc levels: Severely motion degraded axial images. No spinal canal stenosis. There is a small central disc protrusion  noted at T7-8. No abnormal contrast enhancement. IMPRESSION: 1. Motion degraded study. 2. No acute abnormality of the cervical or thoracic spine. 3. Moderate left C6-7 and mild left C5-6 neural foraminal stenosis. 4. No spinal canal stenosis. Electronically Signed   By: Deatra Robinson M.D.   On: 07/22/2020 21:07   DG Hip Unilat W or Wo  Pelvis 2-3 Views Left  Result Date: 07/14/2020 CLINICAL DATA:  Failure to thrive and weight loss EXAM: DG HIP (WITH OR WITHOUT PELVIS) 2-3V LEFT COMPARISON:  01/01/2020 FINDINGS: Pelvic ring is intact. Old healed pubic rami fractures are noted on the left stable from prior CT examination. Degenerative changes of the hip joints are noted right greater than left. No acute fracture or dislocation in the left hip is noted. Some widening of the left sacroiliac joint is noted which appears more prominent than that seen on prior CT. IMPRESSION: Slight widening of the left sacroiliac joint which may be projectional in nature. Degenerative changes of the hip joints bilaterally right greater than left. No acute fracture is seen. Old fractures in the left pubic rami are seen. Electronically Signed   By: Alcide Clever M.D.   On: 07/14/2020 20:56     Subjective: Patient seen examined at bedside, resting comfortably.  Daughter present.  Ready for discharge back to CIR.  Daughter discussed potential of increasing Prozac, patient declined at this time and wants to remain on 10 mg.  No other questions or concerns at this time.  Denies headache, no visual changes, no chest pain, no palpitations, no shortness of breath, no abdominal pain, no weakness, no fatigue, no paresthesias.  No acute events overnight per nursing staff.  Discharge Exam: Vitals:   08/08/20 2126 08/09/20 0405  BP: 116/64 118/68  Pulse: 64 63  Resp:  18  Temp:  98.7 F (37.1 C)  SpO2:  96%   Vitals:   08/08/20 1602 08/08/20 1937 08/08/20 2126 08/09/20 0405  BP: 106/67 (!) 107/59 116/64 118/68  Pulse: 65 65 64 63  Resp:  18  18  Temp:  98.2 F (36.8 C)  98.7 F (37.1 C)  TempSrc:  Oral  Oral  SpO2:  98%  96%  Weight:    52.6 kg  Height:        General: Pt is alert, awake, not in acute distress, elderly in appearance Cardiovascular: RRR, S1/S2 +, no rubs, no gallops Respiratory: CTA bilaterally, no wheezing, no rhonchi, on room  air Abdominal: Soft, NT, ND, bowel sounds + Extremities: no edema, no cyanosis    The results of significant diagnostics from this hospitalization (including imaging, microbiology, ancillary and laboratory) are listed below for reference.     Microbiology: No results found for this or any previous visit (from the past 240 hour(s)).   Labs: BNP (last 3 results) Recent Labs    07/23/20 0337 07/24/20 0403 07/25/20 0102  BNP 94.5 98.4 77.2   Basic Metabolic Panel: Recent Labs  Lab 08/03/20 0534 08/05/20 0515 08/06/20 0119  NA 134* 138  --   K 4.2 3.9 4.2  CL 106 108  --   CO2 21* 20*  --   GLUCOSE 96 92  --   BUN 31* 28*  --   CREATININE 0.73 0.87  --   CALCIUM 9.3 9.2  --   MG  --   --  2.2   Liver Function Tests: Recent Labs  Lab 08/03/20 0534  AST 16  ALT 16  ALKPHOS 45  BILITOT 0.5  PROT 5.7*  ALBUMIN 2.9*   No results for input(s): LIPASE, AMYLASE in the last 168 hours. No results for input(s): AMMONIA in the last 168 hours. CBC: Recent Labs  Lab 08/03/20 0534 08/05/20 0515  WBC 8.3 6.9  NEUTROABS 4.7  --   HGB 11.4* 11.9*  HCT 35.0* 36.7  MCV 88.6 88.9  PLT 391 391   Cardiac Enzymes: No results for input(s): CKTOTAL, CKMB, CKMBINDEX, TROPONINI in the last 168 hours. BNP: Invalid input(s): POCBNP CBG: No results for input(s): GLUCAP in the last 168 hours. D-Dimer No results for input(s): DDIMER in the last 72 hours. Hgb A1c No results for input(s): HGBA1C in the last 72 hours. Lipid Profile No results for input(s): CHOL, HDL, LDLCALC, TRIG, CHOLHDL, LDLDIRECT in the last 72 hours. Thyroid function studies No results for input(s): TSH, T4TOTAL, T3FREE, THYROIDAB in the last 72 hours.  Invalid input(s): FREET3 Anemia work up No results for input(s): VITAMINB12, FOLATE, FERRITIN, TIBC, IRON, RETICCTPCT in the last 72 hours. Urinalysis    Component Value Date/Time   COLORURINE YELLOW 07/20/2020 1759   APPEARANCEUR HAZY (A) 07/20/2020  1759   LABSPEC 1.013 07/20/2020 1759   LABSPEC 1.020 05/27/2020 1541   PHURINE 7.0 07/20/2020 1759   GLUCOSEU NEGATIVE 07/20/2020 1759   HGBUR NEGATIVE 07/20/2020 1759   BILIRUBINUR NEGATIVE 07/20/2020 1759   BILIRUBINUR negative 05/27/2020 1541   KETONESUR NEGATIVE 07/20/2020 1759   PROTEINUR NEGATIVE 07/20/2020 1759   NITRITE NEGATIVE 07/20/2020 1759   LEUKOCYTESUR NEGATIVE 07/20/2020 1759   Sepsis Labs Invalid input(s): PROCALCITONIN,  WBC,  LACTICIDVEN Microbiology No results found for this or any previous visit (from the past 240 hour(s)).   Time coordinating discharge: Over 30 minutes  SIGNED:   Alvira Philips [place], DO  [facility] 08/09/2020, 9:30 AM

## 2020-08-09 NOTE — Progress Notes (Signed)
Inpatient Rehabilitation Admissions Coordinator   Readmitting patient to CIR today. Patient and family in agreement. Acute team and TOC made aware and I will make the arrangements to readmit.  Ottie Glazier, RN, MSN Rehab Admissions Coordinator [phone] 08/09/2020 10:27 AM

## 2020-08-09 NOTE — TOC Transition Note (Signed)
Transition of Care [facility]) - CM/SW Discharge Note   Patient Details  Name: Sheila Andrade MRN: [id number] Date of Birth: 09/11/1935  [facility] of [facility]) CM/SW Contact:  Leone Haven, RN Phone Number: 08/09/2020, 9:51 AM   Clinical Narrative:    Patient to dc back to CIR today.    Final next level of care: IP Rehab Facility Barriers to Discharge: No Barriers Identified   Patient Goals and CMS Choice        Discharge Placement                       Discharge Plan and Services                                     Social Determinants of Health (SDOH) Interventions     Readmission Risk Interventions No flowsheet data found.

## 2020-08-09 NOTE — Progress Notes (Signed)
PT Cancellation Note  Patient Details Name: Sheila Andrade MRN: [id number] DOB: 06-26-1935   Cancelled Treatment:    Reason Eval/Treat Not Completed: Patient preparing for d/c to CIR, will defer treatment.  Ina Homes, PT, DPT Acute Rehabilitation Services  Pager [phone] Office [phone]  Malachy Chamber 08/09/2020, 11:46 AM

## 2020-08-09 NOTE — H&P (Signed)
Physical Medicine and Rehabilitation Admission H&P    CC: Debility   Brief HPI: Sheila Andrade is an 85 year old female with history of HTN, pelvic fx 01/2020 with progressive decline complicated by Covid infection, anorexia with wt loss, severe recurrent depression, recurrent UTIs who was originally admitted to [facility] on 07/14/20 with falls, shuffling gait with parkinsonian type symptoms, SI due to severe depression and FTT. Neurology and psychiatry evaluated patient felt that Parkinsonism with negative work up and felt that tremors likely due to medication effect from Abilify and outpatient NCS/EMG recommended to evaluate BLE weakness. She was weaned off Effexor to Prozac, vitamin B12 supplemented and megace added to help with anorexia.   She was admitted to CIR on 07/29 due to functional deficits from debility. On 08/02, she developed hypotension with asymptomatic SVT and was transferred to telemetry for monitoring and further work up. She was started on metoprolol which has been titrated up for rate control. Dr. Archbold Lions following for input and has educated patient/family on use of Vagal maneuvers. Patient continues to exhibit weakness decreased balance and decline in functional status. She was cleared to resume her rehab program and is returning today.     Review of Systems  Constitutional:  Positive for malaise/fatigue. Negative for fever.  HENT:  Negative for hearing loss.   Eyes:  Negative for blurred vision.  Respiratory:  Negative for cough, shortness of breath and stridor.   Cardiovascular:  Negative for chest pain, palpitations and leg swelling.  Gastrointestinal:  Negative for abdominal pain, heartburn and nausea.  Genitourinary:  Negative for dysuria, frequency and urgency.  Musculoskeletal:  Negative for back pain, joint pain and myalgias.  Skin:  Negative for rash.  Neurological:  Positive for weakness. Negative for headaches.  Psychiatric/Behavioral:  The patient is not  nervous/anxious and does not have insomnia.     Past Medical History:  Diagnosis Date   Hot flashes 06/06/2020   Hypertension     History reviewed. No pertinent surgical history.   Family History  Problem Relation Age of Onset   Hypertension Other     Social History: Was living alone prior to Jan 2022. She has been taking turns living with daughters in [place] and [place]. Retired --has PHD in education. She  reports that she has never smoked. She has never used smokeless tobacco. She reports that she does not use drugs. No history on file for alcohol use.   Allergies: No Known Allergies   Medications Prior to Admission  Medication Sig Dispense Refill   acetaminophen (TYLENOL) 500 MG tablet Take 1,000 mg by mouth every 6 (six) hours as needed for mild pain or headache.     bimatoprost (LUMIGAN) 0.01 % SOLN Place 1 drop into the left eye at bedtime.     brinzolamide (AZOPT) 1 % ophthalmic suspension Place 1 drop into the left eye 2 (two) times daily.     cholecalciferol (VITAMIN D) 25 MCG (1000 UNIT) tablet Take 1,000 Units by mouth daily.     feeding supplement (ENSURE ENLIVE / ENSURE PLUS) LIQD Take 237 mLs by mouth 3 (three) times daily between meals. 237 mL 12   FLUoxetine (PROZAC) 10 MG capsule Take 1 capsule (10 mg total) by mouth daily.  3   hydrOXYzine (ATARAX/VISTARIL) 25 MG tablet Take 1 tablet (25 mg total) by mouth 3 (three) times daily as needed for anxiety. 30 tablet 0   megestrol (MEGACE) 400 MG/10ML suspension Take 10 mLs (400 mg total) by mouth  daily. 240 mL 0   Multiple Vitamin (MULTIVITAMIN WITH MINERALS) TABS tablet Take 1 tablet by mouth daily.     Multiple Vitamins-Minerals (MULTIVITAMIN WITH MINERALS) tablet Take 1 tablet by mouth daily.     nystatin (MYCOSTATIN/NYSTOP) powder Apply 1 application topically 2 (two) times daily as needed (rash under arm).     omeprazole (PRILOSEC) 40 MG capsule Take 40 mg by mouth daily as needed (heartburn).     ondansetron  (ZOFRAN-ODT) 4 MG disintegrating tablet Take 4 mg by mouth every 8 (eight) hours as needed for nausea/vomiting.     polyethylene glycol (MIRALAX / GLYCOLAX) 17 g packet Take 17 g by mouth daily as needed for mild constipation.     simvastatin (ZOCOR) 20 MG tablet Take 20 mg by mouth daily.     traZODone (DESYREL) 50 MG tablet Take 0.5 tablets (25 mg total) by mouth at bedtime.     venlafaxine XR (EFFEXOR-XR) 37.5 MG 24 hr capsule Take 1 capsule (37.5 mg total) by mouth daily with breakfast. Last dose on 08/04/20 (Patient not taking: No sig reported)      Drug Regimen Review  Drug regimen was reviewed and remains appropriate with no significant issues identified  Home: Home Living Family/patient expects to be discharged to:: Inpatient rehab Living Arrangements: Children (lives with daughter, Corrie Dandy) Available Help at Discharge: Family, Friend(s), Available PRN/intermittently, Available 24 hours/day (daughters and daughter in law provide 24/7) Type of Home: House Home Access: Stairs to enter Secretary/administrator of Steps: 2+1 Entrance Stairs-Rails: None Home Layout: Two level, Able to live on main level with bedroom/bathroom Bathroom Shower/Tub: Engineer, manufacturing systems: Handicapped height Bathroom Accessibility: Yes Additional Comments: has been living with her dtr since pelvic fx ~ 8 mos ago  Lives With: Daughter   Functional History: Prior Function Level of Independence: Needs assistance Gait / Transfers Assistance Needed: Pt had been working with [employer] staff on ambulating with RW and performing WC mobility. ADL's / Homemaking Assistance Needed: Pt working with [employer] staff on ADL tasks.  Functional Status:  Mobility: Bed Mobility Overal bed mobility: Needs Assistance Bed Mobility: Supine to Sit, Sit to Supine Supine to sit: Min assist, HOB elevated Sit to supine: Min guard General bed mobility comments: light Min A to scoot hips forward, use of bed rails. able to get B LE  back into bed with min guard only Transfers Overall transfer level: Needs assistance Equipment used: Rolling walker (2 wheeled) Transfers: Sit to/from Stand Sit to Stand: Mod assist Stand pivot transfers: Mod assist, +2 physical assistance General transfer comment: Mod A for sit to stand transfers with posterior lean using RW. Guided pt in additional sit to stand trials x 3 with continued Mod A required Ambulation/Gait Ambulation/Gait assistance: Min assist, +2 safety/equipment Gait Distance (Feet): 45 Feet (+35 ft, standing rest break to recover fatigue) Assistive device: Rolling walker (2 wheeled) Gait Pattern/deviations: Leaning posteriorly, Trunk flexed, Step-through pattern, Decreased stride length, Shuffle, Narrow base of support, Decreased dorsiflexion - right, Decreased dorsiflexion - left General Gait Details: min assist to steady, navigate pt/RW at times in cluttered hallway. Verbal cuing for "big steps", upright posture. Gait velocity: decr    ADL: ADL Overall ADL's : Needs assistance/impaired Lower Body Dressing: Sit to/from stand, Moderate assistance Lower Body Dressing Details (indicate cue type and reason): With increased effort, pt able to demo ability to cross LEs to reach tennis shoes. will need increased assist in standing due to posterior lean for sit to stand transitions Functional mobility  during ADLs: Min guard, Rolling walker, Cueing for sequencing General ADL Comments: Pt requires increased assist for initial sit to stand transfers due to posterior lean but able to mobilize on min guard basis once up on feet. Slow pace and pt at increased risk for falls during ADLs.`  Cognition: Cognition Overall Cognitive Status: Impaired/Different from baseline Orientation Level: Oriented X4 Cognition Arousal/Alertness: Awake/alert Behavior During Therapy: Flat affect Overall Cognitive Status: Impaired/Different from baseline Area of Impairment: Safety/judgement, Following  commands, Awareness, Problem solving, Memory, Attention Current Attention Level: Selective Memory: Decreased short-term memory Following Commands: Follows one step commands with increased time Safety/Judgement: Decreased awareness of safety Awareness: Emergent Problem Solving: Slow processing, Requires verbal cues General Comments: Pt with flat affect, slower processing but able to follow all directions and express needs   Blood pressure 109/61, pulse 64, temperature 98.8 F (37.1 C), temperature source Oral, resp. rate 16, height  (1.702 m), weight 52.6 kg, SpO2 99 %. Physical Exam Constitutional:      Appearance: She is cachectic. She is ill-appearing.     Comments: Fatigued appearing--sat up for 3 hours this am.   HENT:     Head: Normocephalic and atraumatic.     Nose: Nose normal.     Mouth/Throat:     Mouth: Mucous membranes are moist.     Pharynx: Oropharynx is clear.  Eyes:     Conjunctiva/sclera: Conjunctivae normal.     Pupils: Pupils are equal, round, and reactive to light.  Cardiovascular:     Rate and Rhythm: Normal rate and regular rhythm.     Heart sounds: No murmur heard.   No gallop.  Pulmonary:     Effort: Pulmonary effort is normal. No respiratory distress.     Breath sounds: No wheezing.  Abdominal:     General: Bowel sounds are normal. There is no distension.     Tenderness: There is no abdominal tenderness.  Musculoskeletal:        General: No swelling.     Cervical back: Normal range of motion.  Skin:    General: Skin is warm and dry.  Neurological:     Mental Status: She is alert and oriented to person, place, and time.     Comments: Alert and oriented x 3. Normal insight and awareness. Intact Memory. Normal language and speech. Cranial nerve exam unremarkable. UE 4+/5. LE 3+/5 prox to 4-/5 distally. No focal sensory deficits. DTR's 1+  Psychiatric:        Mood and Affect: Affect is blunt.     Comments: Flat, but more engaging and up beat  overall.     No results found for this or any previous visit (from the past 48 hour(s)). No results found.     Medical Problem List and Plan: 1.  Functional deficits secondary to debility, gait disorder  -patient may shower  -ELOS/Goals: 10-14 days, supervision to min assist with PT and supervision with cognition 2.  Antithrombotics: -DVT/anticoagulation:  Pharmaceutical: Lovenox  -antiplatelet therapy: N/A 3. Pain Management: Tylenol prn.  4. Mood: Team to provide ego support. LCSW to follow for evaluation and support.   -antipsychotic agents: N/A 5. Neuropsych: This patient is capable of making decisions on her own behalf. 6. Skin/Wound Care: Routine pressure relief measures.  7. Fluids/Electrolytes/Protein deficient malnutrition: Monitor I/O.  --Continue Ensure TID to help with malnutrition due to FTT. -pt has been eating 50-100% as of late -continue megace which has helped her appetite 8. SVT: Managed with metoprolol 12.5 mg  TID  --patient/family educated on  use of Vagal maneuvers to reset to NSR.  9. Severe recurrent depression: Stable on prozac 10 mg daily. 11. Vitamin B 12 deficiency: On  oral supplement.  12. Frequency/Incontinence: Improving?.   -monitor voiding patterns 13. HTN: Monitor BP tid--controlled on Metoprolol.        Jacquelynn Cree, PA-C 08/09/2020

## 2020-08-09 NOTE — H&P (Signed)
Physical Medicine and Rehabilitation Admission H&P     CC: Debility     Brief HPI: Sheila Andrade is an 85 year old female with history of HTN, pelvic fx 01/2020 with progressive decline complicated by Covid infection, anorexia with wt loss, severe recurrent depression, recurrent UTIs who was originally admitted to [facility] on 07/14/20 with falls, shuffling gait with parkinsonian type symptoms, SI due to severe depression and FTT. Neurology and psychiatry evaluated patient felt that Parkinsonism with negative work up and felt that tremors likely due to medication effect from Abilify and outpatient NCS/EMG recommended to evaluate BLE weakness. She was weaned off Effexor to Prozac, vitamin B12 supplemented and megace added to help with anorexia.   She was admitted to CIR on 07/29 due to functional deficits from debility. On 08/02, she developed hypotension with asymptomatic SVT and was transferred to telemetry for monitoring and further work up. She was started on metoprolol which has been titrated up for rate control. Dr. [place] Lions following for input and has educated patient/family on use of Vagal maneuvers. Patient continues to exhibit weakness decreased balance and decline in functional status. She was cleared to resume her rehab program and is returning today.       Review of Systems Constitutional:  Positive for malaise/fatigue. Negative for fever. HENT:  Negative for hearing loss.   Eyes:  Negative for blurred vision. Respiratory:  Negative for cough, shortness of breath and stridor.   Cardiovascular:  Negative for chest pain, palpitations and leg swelling. Gastrointestinal:  Negative for abdominal pain, heartburn and nausea. Genitourinary:  Negative for dysuria, frequency and urgency. Musculoskeletal:  Negative for back pain, joint pain and myalgias. Skin:  Negative for rash. Neurological:  Positive for weakness. Negative for headaches. Psychiatric/Behavioral:  The patient is not  nervous/anxious and does not have insomnia.           Past Medical History:  Diagnosis Date   Hot flashes 06/06/2020   Hypertension        History reviewed. No pertinent surgical history.          Family History  Problem Relation Age of Onset   Hypertension Other        Social History: Was living alone prior to Jan 2022. She has been taking turns living with daughters in [place] and [place]. Retired --has PHD in education. She  reports that she has never smoked. She has never used smokeless tobacco. She reports that she does not use drugs. No history on file for alcohol use.     Allergies: No Known Allergies           Medications Prior to Admission  Medication Sig Dispense Refill   acetaminophen (TYLENOL) 500 MG tablet Take 1,000 mg by mouth every 6 (six) hours as needed for mild pain or headache.       bimatoprost (LUMIGAN) 0.01 % SOLN Place 1 drop into the left eye at bedtime.       brinzolamide (AZOPT) 1 % ophthalmic suspension Place 1 drop into the left eye 2 (two) times daily.       cholecalciferol (VITAMIN D) 25 MCG (1000 UNIT) tablet Take 1,000 Units by mouth daily.       feeding supplement (ENSURE ENLIVE / ENSURE PLUS) LIQD Take 237 mLs by mouth 3 (three) times daily between meals. 237 mL 12   FLUoxetine (PROZAC) 10 MG capsule Take 1 capsule (10 mg total) by mouth daily.   3   hydrOXYzine (ATARAX/VISTARIL) 25  MG tablet Take 1 tablet (25 mg total) by mouth 3 (three) times daily as needed for anxiety. 30 tablet 0   megestrol (MEGACE) 400 MG/10ML suspension Take 10 mLs (400 mg total) by mouth daily. 240 mL 0   Multiple Vitamin (MULTIVITAMIN WITH MINERALS) TABS tablet Take 1 tablet by mouth daily.       Multiple Vitamins-Minerals (MULTIVITAMIN WITH MINERALS) tablet Take 1 tablet by mouth daily.       nystatin (MYCOSTATIN/NYSTOP) powder Apply 1 application topically 2 (two) times daily as needed (rash under arm).       omeprazole (PRILOSEC) 40 MG capsule Take 40 mg by mouth  daily as needed (heartburn).       ondansetron (ZOFRAN-ODT) 4 MG disintegrating tablet Take 4 mg by mouth every 8 (eight) hours as needed for nausea/vomiting.       polyethylene glycol (MIRALAX / GLYCOLAX) 17 g packet Take 17 g by mouth daily as needed for mild constipation.       simvastatin (ZOCOR) 20 MG tablet Take 20 mg by mouth daily.       traZODone (DESYREL) 50 MG tablet Take 0.5 tablets (25 mg total) by mouth at bedtime.       venlafaxine XR (EFFEXOR-XR) 37.5 MG 24 hr capsule Take 1 capsule (37.5 mg total) by mouth daily with breakfast. Last dose on 08/04/20 (Patient not taking: No sig reported)          Drug Regimen Review  Drug regimen was reviewed and remains appropriate with no significant issues identified   Home: Home Living Family/patient expects to be discharged to:: Inpatient rehab Living Arrangements: Children (lives with daughter, Corrie Dandy) Available Help at Discharge: Family, Friend(s), Available PRN/intermittently, Available 24 hours/day (daughters and daughter in law provide 24/7) Type of Home: House Home Access: Stairs to enter Secretary/administrator of Steps: 2+1 Entrance Stairs-Rails: None Home Layout: Two level, Able to live on main level with bedroom/bathroom Bathroom Shower/Tub: Engineer, manufacturing systems: Handicapped height Bathroom Accessibility: Yes Additional Comments: has been living with her dtr since pelvic fx ~ 8 mos ago  Lives With: Daughter   Functional History: Prior Function Level of Independence: Needs assistance Gait / Transfers Assistance Needed: Pt had been working with [employer] staff on ambulating with RW and performing WC mobility. ADL's / Homemaking Assistance Needed: Pt working with [employer] staff on ADL tasks.   Functional Status:  Mobility: Bed Mobility Overal bed mobility: Needs Assistance Bed Mobility: Supine to Sit, Sit to Supine Supine to sit: Min assist, HOB elevated Sit to supine: Min guard General bed mobility comments: light Min  A to scoot hips forward, use of bed rails. able to get B LE back into bed with min guard only Transfers Overall transfer level: Needs assistance Equipment used: Rolling walker (2 wheeled) Transfers: Sit to/from Stand Sit to Stand: Mod assist Stand pivot transfers: Mod assist, +2 physical assistance General transfer comment: Mod A for sit to stand transfers with posterior lean using RW. Guided pt in additional sit to stand trials x 3 with continued Mod A required Ambulation/Gait Ambulation/Gait assistance: Min assist, +2 safety/equipment Gait Distance (Feet): 45 Feet (+35 ft, standing rest break to recover fatigue) Assistive device: Rolling walker (2 wheeled) Gait Pattern/deviations: Leaning posteriorly, Trunk flexed, Step-through pattern, Decreased stride length, Shuffle, Narrow base of support, Decreased dorsiflexion - right, Decreased dorsiflexion - left General Gait Details: min assist to steady, navigate pt/RW at times in cluttered hallway. Verbal cuing for "big steps", upright posture. Gait velocity: decr  ADL: ADL Overall ADL's : Needs assistance/impaired Lower Body Dressing: Sit to/from stand, Moderate assistance Lower Body Dressing Details (indicate cue type and reason): With increased effort, pt able to demo ability to cross LEs to reach tennis shoes. will need increased assist in standing due to posterior lean for sit to stand transitions Functional mobility during ADLs: Min guard, Rolling walker, Cueing for sequencing General ADL Comments: Pt requires increased assist for initial sit to stand transfers due to posterior lean but able to mobilize on min guard basis once up on feet. Slow pace and pt at increased risk for falls during ADLs.`   Cognition: Cognition Overall Cognitive Status: Impaired/Different from baseline Orientation Level: Oriented X4 Cognition Arousal/Alertness: Awake/alert Behavior During Therapy: Flat affect Overall Cognitive Status: Impaired/Different from  baseline Area of Impairment: Safety/judgement, Following commands, Awareness, Problem solving, Memory, Attention Current Attention Level: Selective Memory: Decreased short-term memory Following Commands: Follows one step commands with increased time Safety/Judgement: Decreased awareness of safety Awareness: Emergent Problem Solving: Slow processing, Requires verbal cues General Comments: Pt with flat affect, slower processing but able to follow all directions and express needs     Blood pressure 109/61, pulse 64, temperature 98.8 F (37.1 C), temperature source Oral, resp. rate 16, height 5\' 7"  (1.702 m), weight 52.6 kg, SpO2 99 %. Physical Exam Constitutional:      Appearance: She is cachectic. She is ill-appearing.    Comments: Fatigued appearing--sat up for 3 hours this am.   HENT:    Head: Normocephalic and atraumatic.    Nose: Nose normal.    Mouth/Throat:    Mouth: Mucous membranes are moist.    Pharynx: Oropharynx is clear. Eyes:    Conjunctiva/sclera: Conjunctivae normal.    Pupils: Pupils are equal, round, and reactive to light. Cardiovascular:    Rate and Rhythm: Normal rate and regular rhythm.    Heart sounds: Systolic murmur heard.   No gallop. Pulmonary:    Effort: Pulmonary effort is normal. No respiratory distress.    Breath sounds: No wheezing. Abdominal:    General: Bowel sounds are normal. There is no distension.    Tenderness: There is no abdominal tenderness. Musculoskeletal:        General: No swelling.    Cervical back: Normal range of motion. Skin:    General: Skin is warm and dry. Neurological:    Mental Status: She is alert and oriented to person, place, and time.    Comments: Alert and oriented x 3. Normal insight and awareness. Intact Memory. Normal language and speech. Cranial nerve exam unremarkable. UE 4+/5. LE 3+/5 prox to 4-/5 distally. No focal sensory deficits. DTR's 1+  Psychiatric:        Mood and Affect: Affect is blunt.    Comments:  Flat, but more engaging and up beat overall.       Lab Results Last 48 Hours  No results found for this or any previous visit (from the past 48 hour(s)).   Imaging Results (Last 48 hours)  No results found.           Medical Problem List and Plan: 1.  Functional deficits secondary to debility, gait disorder             -patient may shower             -ELOS/Goals: 10-14 days, supervision to min assist with PT and supervision with cognition 2.  Antithrombotics: -DVT/anticoagulation:  Pharmaceutical: Lovenox             -  antiplatelet therapy: N/A 3. Pain Management: Tylenol prn.  4. Mood: Team to provide ego support. LCSW to follow for evaluation and support.              -antipsychotic agents: N/A 5. Neuropsych: This patient is capable of making decisions on her own behalf. 6. Skin/Wound Care: Routine pressure relief measures.  7. Fluids/Electrolytes/Protein deficient malnutrition: Monitor I/O. --Continue Ensure TID to help with malnutrition due to FTT. -pt has been eating 50-100% as of late -continue megace which has helped her appetite 8. SVT: Managed with metoprolol 12.5 mg TID             --patient/family educated on  use of Vagal maneuvers to reset to NSR. 9. Severe recurrent depression: Stable on prozac 10 mg daily. 11. Vitamin B 12 deficiency: On  oral supplement. 12. Frequency/Incontinence: Improving?.              -monitor voiding patterns 13. HTN: Monitor BP tid--controlled on Metoprolol.          Jacquelynn Creeamela S Love, PA-C 08/09/2020   I have personally performed a face to face diagnostic evaluation of this patient and formulated the key components of the plan.  Additionally, I have personally reviewed laboratory data, imaging studies, as well as relevant notes and concur with the physician assistant's documentation above.  The patient's status has not changed from the original H&P.  Any changes in documentation from the acute care chart have been noted above.  Ranelle OysterZachary  T. Delisa Finck, MD, [place] DomFAAPMR

## 2020-08-09 NOTE — Progress Notes (Signed)
Inpatient Rehabilitation  Patient information reviewed and entered into eRehab system by Paelyn Smick M. Jeffree Cazeau, M.A., CCC/SLP, PPS Coordinator.  Information including medical coding, functional ability and quality indicators will be reviewed and updated through discharge.    

## 2020-08-09 NOTE — Progress Notes (Addendum)
Progress Note  Patient Name: Sheila Andrade Date of Encounter: 08/09/2020  Primary Cardiologist: Rollene Rotunda, MD  Subjective   Asymptomatic. No CP or SOB. Had 2 brief recurrences of PSVT this AM.  Inpatient Medications    Scheduled Meds:  brinzolamide  1 drop Left Eye BID   enoxaparin (LOVENOX) injection  40 mg Subcutaneous Daily   feeding supplement  237 mL Oral TID BM   FLUoxetine  10 mg Oral Daily   latanoprost  1 drop Left Eye QHS   megestrol  400 mg Oral Daily   metoprolol tartrate  12.5 mg Oral TID   multivitamin with minerals  1 tablet Oral Daily   pantoprazole  40 mg Oral Daily   simvastatin  20 mg Oral Daily   traZODone  25 mg Oral QHS   Continuous Infusions:  PRN Meds: acetaminophen **OR** acetaminophen, nystatin, polyethylene glycol   Vital Signs    Vitals:   08/08/20 2126 08/09/20 0405 08/09/20 1007 08/09/20 1108  BP: 116/64 118/68 101/61 109/61  Pulse: 64 63 66 64  Resp:  18  16  Temp:  98.7 F (37.1 C)  98.8 F (37.1 C)  TempSrc:  Oral  Oral  SpO2:  96%  99%  Weight:  52.6 kg    Height:        Intake/Output Summary (Last 24 hours) at 08/09/2020 1144 Last data filed at 08/09/2020 0841 Gross per 24 hour  Intake 1080 ml  Output 800 ml  Net 280 ml   Last 3 Weights 08/09/2020 08/08/2020 08/07/2020  Weight (lbs) 115 lb 15.4 oz 115 lb 1.3 oz 117 lb 1 oz  Weight (kg) 52.6 kg 52.2 kg 53.1 kg     Telemetry    Largely NSR but 2 brief recurrences of SVT around 4:30am and 6:58am, transient, asymptomatic- Personally Reviewed   Physical Exam   GEN: No acute distress, frail appearing HEENT: Normocephalic, atraumatic, sclera non-icteric. Neck: No JVD or bruits. Cardiac: RRR no murmurs, rubs, or gallops.  Respiratory: Clear to auscultation bilaterally. Breathing is unlabored. GI: Soft, nontender, non-distended, BS +x 4. MS: no deformity. Extremities: No clubbing or cyanosis. No edema. Distal pedal pulses are 2+ and equal bilaterally. Neuro:  AAOx3.  Follows commands. Psych:  Responds to questions appropriately with a flat affect. No active SI/HI   Labs    High Sensitivity Troponin:   Recent Labs  Lab 08/06/20 0452  TROPONINIHS 17      Cardiac EnzymesNo results for input(s): TROPONINI in the last 168 hours. No results for input(s): TROPIPOC in the last 168 hours.   Chemistry Recent Labs  Lab 08/03/20 0534 08/05/20 0515 08/06/20 0119  NA 134* 138  --   K 4.2 3.9 4.2  CL 106 108  --   CO2 21* 20*  --   GLUCOSE 96 92  --   BUN 31* 28*  --   CREATININE 0.73 0.87  --   CALCIUM 9.3 9.2  --   PROT 5.7*  --   --   ALBUMIN 2.9*  --   --   AST 16  --   --   ALT 16  --   --   ALKPHOS 45  --   --   BILITOT 0.5  --   --   GFRNONAA >60 >60  --   ANIONGAP 7 10  --      Hematology Recent Labs  Lab 08/03/20 0534 08/05/20 0515  WBC 8.3 6.9  RBC 3.95 4.13  HGB  11.4* 11.9*  HCT 35.0* 36.7  MCV 88.6 88.9  MCH 28.9 28.8  MCHC 32.6 32.4  RDW 14.7 14.8  PLT 391 391    BNPNo results for input(s): BNP, PROBNP in the last 168 hours.   DDimer No results for input(s): DDIMER in the last 168 hours.   Radiology    No results found.  Cardiac Studies   2D echo 06/14/20 CareEverywhere  Summary    1. The left ventricle is normal in size with normal wall thickness.    2. The left ventricular systolic function is normal, LVEF is visually  estimated at > 55%.    3. The right ventricle is normal in size, with normal systolic function.    4. There is mild aortic valve stenosis.   Patient Profile     85 y.o. female with mild aortic stenosis, HLD, HTN,  depression, GERD iron deficiency anemia, prior near-syncope, PSVT failure to thrive whom we are seeing for SVT. She was recently admitted 7/10-7/29/22 for functional decline, failure to thrive, decreased oral intake, bradykinesia, hyperreflexia, poor balance, malnutrition, weight loss, and severe depression with suicidal ideation. She required transfer to CIR for delibiltation.  While at CIR she had an episode of SVT associated with hypotension so was transferred back to [facility] for cardiac monitoring.  Cardiology following for PSVT.  Assessment & Plan    1. PSVT associated with hypotension - had 2 brief episodes of breakthrough SVT overnight, short-lived and self-terminated - continue metoprolol at present dose, will discuss consolidating to easier Toprol XL dosing with MD - if this becomes an issue going forward, consider switching metoprolol to CCB or amiodarone - recent TSH wnl, last Mg 2.2, K 4.2, troponin negative this AM  2. Mild aortic stenosis - echo per care everywhere 06/14/20 EF >55%, normal RV, mild AS - continue OP clinical surveillance, guidelines suggest repeat echo in 3-5 years if clinically appropriate  Remainder of medical issues per IM. I sent message to weekend early APP to make sure pt is followed at CIR.  For questions or updates, please contact [facility] Please consult [email or web address] for contact info under Cardiology/STEMI.  Signed, Laurann Montana, PA-C 08/09/2020, 11:44 AM    History and all data above reviewed.  Patient examined.  I agree with the findings as above.  No chest pain.  No SOB.  She has not felt her short runs of SVT The patient exam reveals COR:RRR  ,  Lungs: Clear  ,  Abd: Positive bowel sounds, no rebound no guarding, Ext No edema.     All available labs, radiology testing, previous records reviewed. Agree with documented assessment and plan. I will change to Toprol XL 25 mg daily.    Sheila Andrade  12:35 PM  08/09/2020

## 2020-08-09 NOTE — Progress Notes (Signed)
Noted to have another ST-did not last long, upon assessment. Pt is using Bedpan with NT in room. Pt is asymptomatic.

## 2020-08-09 NOTE — Progress Notes (Signed)
INPATIENT REHABILITATION ADMISSION NOTE   Arrival Method: bed     Mental Orientation: A&O x4   Assessment: done   Skin: done   IV'S: one present   Pain: none   Tubes and Drains: none   Safety Measures: done   Vital Signs: done   Height and Weight: done   Rehab Orientation: done   Family: at bedside    Notes: done

## 2020-08-09 NOTE — Progress Notes (Signed)
Inpatient Rehabilitation Medication Review by a Pharmacist  A complete drug regimen review was completed for this patient to identify any potential clinically significant medication issues.  Clinically significant medication issues were identified:  no  Check AMION for pharmacist assigned to patient if future medication questions/issues arise during this admission.  Pharmacist comments:   Time spent performing this drug regimen review (minutes):  20   Kayveon Lennartz 08/09/2020 5:03 PM

## 2020-08-09 NOTE — Progress Notes (Signed)
Meredith Staggers, MD   Physician  Physical Medicine and Rehabilitation  PMR Pre-admission     Signed  Date of Service:  08/07/2020  3:20 PM       Related encounter: Admission (Current) from 08/06/2020 in [facility]       Signed          Show:Clear all $RemoveBefore'[x]'fgWSrXtGohHTU$ Written$R'[x]'QO$ Templat'[x]'$ Copied  Added by: $RemoveB'[x]'UJWJTNex$ Julious Payer, Vertis Kelch, RN$RemoveBeforeD'[x]'BPMmLFfzeAMLUj$ Meredith Staggers, MD   '[]'$ Hover for details                                                                                                                                                                                                                                                                                                                                                                                                                                                                    PMR Admission Coordinator Pre-Admission Assessment   Patient: Sheila Andrade is an 85 y.o., female MRN: [id number] DOB: [birth year] Height: $RemoveBeforeDE'5\' 7"'PnrqDwLPXEBPBDS$  (170.2 cm) Weight: 52.6 kg   Insurance Information HMO:     PPO:      PCP:      IPA:      80/20:      OTHER: PRIMARY: [employer] Medicare  Policy#: [id number]      Subscriber: pt CM Name: Sheila Andrade      Phone#: [phone] Ext [phone]   Fax#: [phone] Pre-Cert#: [id number]  approved for 7 days    Employer: Benefits:  Phone #: online     Name: 8/3 Eff. Date: 01/06/2019     Deduct: none      Out of Pocket Max: $4000      Life Max: none CIR: $160 co pay per day days 1 until 10      SNF: no copy days 1 until 20; $188 co pay ep days 21 until 41; no copay days 42 until; 100 Outpatient: $20 per visit     Co-Pay: visits per medical neccesity Home Health: 100%      Co-Pay: visits per medical neccesity DME: 80%     Co-Pay: 20% Providers: in network  SECONDARY: none       Policy#:      Phone#:   Development worker, community:       Phone#:   [employer], [employer]" for patients in Inpatient Rehabilitation Facilities with attached "Privacy Act [place] Records" was provided and verbally reviewed with: Patient and Family   Emergency Contact Information Contact Information       Name Relation Home Work Mobile    [place] Daughter [phone]   [phone]    [name],[name] Daughter     [phone]    [name] Daughter     [phone]           Current Medical History  Patient Admitting Diagnosis: debility, failure to thrive   History of Present Illness:  85 year old female with history of HTN, fall with pelvic Fx 01/2020 with progressive decline, recent Covid infection complicated by urinary retention/UTI, abdominal pain w/anorexia and wt loss (negative EGD 7/22), severe recurrent depression who was admitted on 07/14/20 with confusion, falls, weakness and inability to stand. She was briefly started on Rocephin due to + UA and culture w/Klebsiella Oxytoca/ornithia felt to be due to colonization therefore antibiotics d/c.   She also expressed severe depression with SI ideation and psychiatry consulted for input. She was also noted to have shuffling gait with tremors and freezing therefore neurology consulted for input on Parkinsonian features. Psychiatry and neurology has been following during hospitalization to help determine cause --Parkinsonism due to medication effect v/s severe depression v/s pseudodementia.  MRI C/T/L spine negative for compression or explanation of BLE weakness. Outpatient neurology work up with EMG/NCS and formal neurocognitive evaluation recommended by Dr. Quinn Axe. F Abilify was discontinued, Effexor slowly being tapered off, Trazodone added for sleep wake disruption with atarax tid prn for anxiety--ativan caused excessive sedation, Megace for appetite stimulant as well as recommendations for outpatient psych  referral for further medication management. Dr. Lurline Hare w/psychiatry recommends lowering Prozac to 10 mg starting 07/30 "to see if it aided with memory issues" question causing memory issues.   Patient was admitted to [facility] on 08/02/2020. On 08/06/2020 she has  noted to have tachycardia when her vitals were taken. During that time her BP was low as 80/60. Previous history of near syncope multiple times. Readmitted to acute hospital on 08/06/2020 for SVT. Cardiology was consulted and started on low dose metoprolol. Echo unremarkable. Lovenox for DVT prophylaxis. Educated patient and family on vagal maneuvers.   Patient's medical record from [facility]  has been reviewed by the rehabilitation admission coordinator and physician.   Past Medical History      Past Medical History:  Diagnosis  Date   Hot flashes 06/06/2020   Hypertension        Family History   family history includes Hypertension in an other family member.   Prior Rehab/Hospitalizations Has the patient had prior rehab or hospitalizations prior to admission? Yes CIR 08/02/20 until 08/06/20 when readmitted to acute   Has the patient had major surgery during 100 days prior to admission? No               Current Medications   Current Facility-Administered Medications:   acetaminophen (TYLENOL) tablet 650 mg, 650 mg, Oral, Q6H PRN, 650 mg at 08/07/20 2013 **OR** acetaminophen (TYLENOL) suppository 650 mg, 650 mg, Rectal, Q6H PRN, [name], MD   brinzolamide (AZOPT) 1 % ophthalmic suspension 1 drop, 1 drop, Left Eye, BID, Hosie Poisson, MD, 1 drop at 08/08/20 2127   enoxaparin (LOVENOX) injection 40 mg, 40 mg, Subcutaneous, Daily, [name], MD, 40 mg at 08/08/20 1610   feeding supplement (ENSURE ENLIVE / ENSURE PLUS) liquid 237 mL, 237 mL, Oral, TID BM, Karleen Hampshire, Vijaya, MD, 237 mL at 08/08/20 1346   FLUoxetine (PROZAC) capsule 10 mg, 10 mg, Oral, Daily, Karleen Hampshire, Vijaya, MD, 10 mg at 08/08/20 0921   latanoprost (XALATAN)  0.005 % ophthalmic solution 1 drop, 1 drop, Left Eye, QHS, Hosie Poisson, MD, 1 drop at 08/08/20 2127   megestrol (MEGACE) 400 MG/10ML suspension 400 mg, 400 mg, Oral, Daily, Karleen Hampshire, Vijaya, MD, 400 mg at 08/08/20 9604   metoprolol tartrate (LOPRESSOR) tablet 12.5 mg, 12.5 mg, Oral, TID, Margie Billet, NP, 12.5 mg at 08/08/20 2126   multivitamin with minerals tablet 1 tablet, 1 tablet, Oral, Daily, Hosie Poisson, MD, 1 tablet at 08/08/20 5409   nystatin (MYCOSTATIN/NYSTOP) topical powder 1 application, 1 application, Topical, BID PRN, Hosie Poisson, MD   pantoprazole (PROTONIX) EC tablet 40 mg, 40 mg, Oral, Daily, Hosie Poisson, MD, 40 mg at 08/08/20 8119   polyethylene glycol (MIRALAX / GLYCOLAX) packet 17 g, 17 g, Oral, Daily PRN, Hosie Poisson, MD   simvastatin (ZOCOR) tablet 20 mg, 20 mg, Oral, Daily, Hosie Poisson, MD, 20 mg at 08/08/20 1478   traZODone (DESYREL) tablet 25 mg, 25 mg, Oral, QHS, Hosie Poisson, MD, 25 mg at 08/08/20 2126   Patients Current Diet:  Diet Order                  Diet regular Room service appropriate? Yes with Assist; Fluid consistency: Thin  Diet effective now                       Precautions / Restrictions Precautions Precautions: Fall Precaution Comments: near syncopal episodes prior to admission, posterior lean Restrictions Weight Bearing Restrictions: No    Has the patient had 2 or more falls or a fall with injury in the past year? Yes   Prior Activity Level Limited Community (1-2x/wk): limited to appointments only   Prior Functional Level Self Care: Did the patient need help bathing, dressing, using the toilet or eating? Needed some help   Indoor Mobility: Did the patient need assistance with walking from room to room (with or without device)? Needed some help   Stairs: Did the patient need assistance with internal or external stairs (with or without device)? Needed some help   Functional Cognition: Did the patient need help planning regular  tasks such as shopping or remembering to take medications? Needed some help   Home Assistive Devices / [place] Devices/Equipment: Gilford Rile (specify type)  Prior Device Use: Indicate devices/aids used by the patient prior to current illness, exacerbation or injury? Walker   Current Functional Level Cognition   Overall Cognitive Status: Impaired/Different from baseline Current Attention Level: Selective Orientation Level: Oriented X4 Following Commands: Follows one step commands with increased time Safety/Judgement: Decreased awareness of safety General Comments: Pt with flat affect, slower processing but able to follow all directions and express needs    Extremity Assessment (includes Sensation/Coordination)   Upper Extremity Assessment: Generalized weakness  Lower Extremity Assessment: Defer to PT evaluation     ADLs   Overall ADL's : Needs assistance/impaired Lower Body Dressing: Sit to/from stand, Moderate assistance Lower Body Dressing Details (indicate cue type and reason): With increased effort, pt able to demo ability to cross LEs to reach tennis shoes. will need increased assist in standing due to posterior lean for sit to stand transitions Functional mobility during ADLs: Min guard, Rolling walker, Cueing for sequencing General ADL Comments: Pt requires increased assist for initial sit to stand transfers due to posterior lean but able to mobilize on min guard basis once up on feet. Slow pace and pt at increased risk for falls during ADLs.`     Mobility   Overal bed mobility: Needs Assistance Bed Mobility: Supine to Sit, Sit to Supine Supine to sit: Min assist, HOB elevated Sit to supine: Min guard General bed mobility comments: light Min A to scoot hips forward, use of bed rails. able to get B LE back into bed with min guard only     Transfers   Overall transfer level: Needs assistance Equipment used: Rolling walker (2 wheeled) Transfers: Sit to/from  Stand Sit to Stand: Mod assist Stand pivot transfers: Mod assist, +2 physical assistance General transfer comment: Mod A for sit to stand transfers with posterior lean using RW. Guided pt in additional sit to stand trials x 3 with continued Mod A required     Ambulation / Gait / Stairs / Wheelchair Mobility   Ambulation/Gait Ambulation/Gait assistance: Min assist, +2 safety/equipment Gait Distance (Feet): 45 Feet (+35 ft, standing rest break to recover fatigue) Assistive device: Rolling walker (2 wheeled) Gait Pattern/deviations: Leaning posteriorly, Trunk flexed, Step-through pattern, Decreased stride length, Shuffle, Narrow base of support, Decreased dorsiflexion - right, Decreased dorsiflexion - left General Gait Details: min assist to steady, navigate pt/RW at times in cluttered hallway. Verbal cuing for "big steps", upright posture. Gait velocity: decr     Posture / Balance Dynamic Sitting Balance Sitting balance - Comments: able to sit unsupported, lean trunk multidirectionally without LOB Balance Overall balance assessment: Needs assistance Sitting-balance support: Feet supported, No upper extremity supported Sitting balance-Leahy Scale: Good Sitting balance - Comments: able to sit unsupported, lean trunk multidirectionally without LOB Standing balance support: During functional activity, Bilateral upper extremity supported Standing balance-Leahy Scale: Poor Standing balance comment: reliant on external support     Special needs/care consideration      Previous Home Environment  Living Arrangements: Children (lives with daughter, Stanton Kidney)  Lives With: Daughter Available Help at Discharge: Family, Friend(s), Available PRN/intermittently, Available 24 hours/day (daughters and daughter in law provide 24/7) Type of Home: House Home Layout: Two level, Able to live on main level with bedroom/bathroom Home Access: Stairs to enter Entrance Stairs-Rails: None Entrance Stairs-Number of  Steps: 2+1 Bathroom Shower/Tub: Chiropodist: Handicapped height Bathroom Accessibility: Yes How Accessible: Accessible via walker Home Care Services: No Additional Comments: has been living with her dtr since pelvic fx ~ 8 mos ago  Discharge Living Setting Plans for Discharge Living Setting: House Type of Home at Discharge: House Discharge Home Layout: Two level, Able to live on main level with bedroom/bathroom Discharge Home Access: Stairs to enter Entrance Stairs-Rails: None Entrance Stairs-Number of Steps: 3 Discharge Bathroom Shower/Tub: Tub/shower unit Discharge Bathroom Toilet: Handicapped height Discharge Bathroom Accessibility: Yes How Accessible: Accessible via walker Does the patient have any problems obtaining your medications?: No   Social/Family/Support Systems Patient Roles: Parent Contact Information: daughter, Stanton Kidney Anticipated Caregiver: Family Anticipated Caregiver's Contact Information: see above Ability/Limitations of Caregiver: Children and family members work so will need to come up with a plan for DC Caregiver Availability: Other (Comment) Discharge Plan Discussed with Primary Caregiver: Yes Is Caregiver In Agreement with Plan?: Yes Does Caregiver/Family have Issues with Lodging/Transportation while Pt is in Rehab?: No   Goals Patient/Family Goal for Rehab: superviison to min assist with PT and OT, supervision with SLP Expected length of stay: ELOS 10 to 14 days Pt/Family Agrees to Admission and willing to participate: Yes Program Orientation Provided & Reviewed with Pt/Caregiver Including Roles  & Responsibilities: Yes   Decrease burden of Care through IP rehab admission: n/a   Possible need for SNF placement upon discharge: not anticipated   Patient Condition: I have reviewed medical records from [facility] , spoken with patient and daughter. I met with patient at the bedside for inpatient rehabilitation assessment.   Patient will benefit from ongoing PT, OT, and SLP, can actively participate in 3 hours of therapy a day 5 days of the week, and can make measurable gains during the admission.  Patient will also benefit from the coordinated team approach during an Inpatient Acute Rehabilitation admission.  The patient will receive intensive therapy as well as Rehabilitation physician, nursing, social worker, and care management interventions.  Due to bladder management, bowel management, safety, skin/wound care, disease management, medication administration, pain management, and patient education the patient requires 24 hour a day rehabilitation nursing.  The patient is currently mod assist overall with mobility and basic ADLs.  Discharge setting and therapy post discharge at home with home health is anticipated.  Patient has agreed to participate in the Acute Inpatient Rehabilitation Program and will admit today.   Preadmission Screen Completed By:  Cleatrice Burke, 08/09/2020 9:21 AM ______________________________________________________________________   Discussed status with Dr. Naaman Plummer on  08/09/2020 at 1028 and received approval for admission today.   Admission Coordinator:  Cleatrice Burke, RN, time  08/09/2020 Date 1028    Assessment/Plan: Diagnosis: debility, FTT Does the need for close, 24 hr/day Medical supervision in concert with the patient's rehab needs make it unreasonable for this patient to be served in a less intensive setting? Yes Co-Morbidities requiring supervision/potential complications: HTN, post-covid, severe depression, hx of pelvic fx Due to bladder management, bowel management, safety, skin/wound care, disease management, medication administration, pain management, and patient education, does the patient require 24 hr/day rehab nursing? Yes Does the patient require coordinated care of a physician, rehab nurse, PT, OT, and SLP to address physical and functional deficits in the context  of the above medical diagnosis(es)? Yes Addressing deficits in the following areas: balance, endurance, locomotion, strength, transferring, bowel/bladder control, bathing, dressing, feeding, grooming, toileting, cognition, speech, language, swallowing, and psychosocial support Can the patient actively participate in an intensive therapy program of at least 3 hrs of therapy 5 days a week? Yes The potential for patient to make measurable gains while on inpatient rehab is excellent Anticipated functional outcomes upon  discharge from inpatient rehab: supervision and min assist PT, supervision and min assist OT, supervision SLP Estimated rehab length of stay to reach the above functional goals is: 10-14 days Anticipated discharge destination: Home 10. Overall Rehab/Functional Prognosis: excellent     MD Signature: Meredith Staggers, MD, [place] Physical Medicine & Rehabilitation 08/09/2020           Revision History                                       Note Details  Author Meredith Staggers, MD File Time 08/09/2020 10:33 AM  Author Type Physician Status Signed  Last Editor Meredith Staggers, MD Service Physical Medicine and Rehabilitation

## 2020-08-09 NOTE — Evaluation (Signed)
Occupational Therapy Assessment and Plan  Patient Details  Name: Sheila Andrade MRN: [id number] Date of Birth: January 22, 1935  OT Diagnosis: abnormal posture, cognitive deficits, muscle weakness (generalized), and coordination disorder Rehab Potential: Rehab Potential (ACUTE ONLY): Good ELOS: 14-16 days   Today's Date: 08/10/2020 OT Individual Time: 3976-7341 OT Individual Time Calculation (min): 58 min     Hospital Problem: Principal Problem:   Debility   Past Medical History:  Past Medical History:  Diagnosis Date   Hot flashes 06/06/2020   Hypertension    Past Surgical History: No past surgical history on file.  Assessment & Plan Clinical Impression: Sheila Andrade is an 85 year old female with history of HTN, pelvic fx 01/2020 with progressive decline complicated by Covid infection, anorexia with wt loss, severe recurrent depression, recurrent UTIs who was originally admitted to [facility] on 07/14/20 with falls, shuffling gait with parkinsonian type symptoms, SI due to severe depression and FTT. Neurology and psychiatry evaluated patient felt that Parkinsonism with negative work up and felt that tremors likely due to medication effect from Abilify and outpatient NCS/EMG recommended to evaluate BLE weakness. She was weaned off Effexor to Prozac, vitamin B12 supplemented and megace added to help with anorexia.   She was admitted to CIR on 07/29 due to functional deficits from debility. On 08/02, she developed hypotension with asymptomatic SVT and was transferred to telemetry for monitoring and further work up. She was started on metoprolol which has been titrated up for rate control. Dr. Warren Lacy following for input and has educated patient/family on use of Vagal maneuvers. Patient continues to exhibit weakness decreased balance and decline in functional status. She was cleared to resume her rehab program and is returning today.     Patient currently requires mod with basic self-care skills  secondary to muscle weakness, decreased cardiorespiratoy endurance, impaired timing and sequencing, decreased coordination, and decreased motor planning, decreased awareness, decreased problem solving, and decreased memory, and decreased standing balance, decreased postural control, and decreased balance strategies.  Prior to hospitalization, patient could complete BADLs with daughter assistance.  Patient will benefit from skilled intervention to increase independence with basic self-care skills prior to discharge  home with daughter Sheila Andrade .  Anticipate patient will require minimal physical assistance and follow up home health.  OT - End of Session Endurance Deficit: Yes Endurance Deficit Description: Decreased tolerance to standing and general activity during self care participation OT Assessment Rehab Potential (ACUTE ONLY): Good OT Barriers to Discharge: Incontinence;Lack of/limited family support OT Patient demonstrates impairments in the following area(s): Balance;Cognition;Endurance;Motor;Safety;Vision OT Basic ADL's Functional Problem(s): Grooming;Bathing;Dressing;Toileting OT Advanced ADL's Functional Problem(s): Simple Meal Preparation OT Transfers Functional Problem(s): Toilet;Tub/Shower OT Additional Impairment(s): Fuctional Use of Upper Extremity OT Plan OT Intensity: Minimum of 1-2 x/day, 45 to 90 minutes OT Frequency: 5 out of 7 days OT Duration/Estimated Length of Stay: 14-16 days OT Treatment/Interventions: Balance/vestibular training;Discharge planning;Pain management;Self Care/advanced ADL retraining;Therapeutic Activities;UE/LE Coordination activities;Cognitive remediation/compensation;Disease mangement/prevention;Functional mobility training;Patient/family education;Skin care/wound managment;Therapeutic Exercise;Visual/perceptual remediation/compensation;DME/adaptive equipment instruction;Neuromuscular re-education;Psychosocial support;UE/LE Strength taining/ROM;Wheelchair  propulsion/positioning OT Self Feeding Anticipated Outcome(s): no goal OT Basic Self-Care Anticipated Outcome(s): Min A OT Toileting Anticipated Outcome(s): Min A OT Bathroom Transfers Anticipated Outcome(s): Min A OT Recommendation Recommendations for Other Services: Therapeutic Recreation consult Therapeutic Recreation Interventions: Pet therapy Patient destination: Home Follow Up Recommendations: 24 hour supervision/assistance;Home health OT Equipment Recommended: To be determined   OT Evaluation Precautions/Restrictions  Precautions Precautions: Fall Precaution Comments: Posterior lean in standing Restrictions Weight Bearing Restrictions: No  Pain Pain Assessment Pain  Scale: 0-10 Pain Score: 0-No pain Home Living/Prior Functioning Home Living Available Help at Discharge: Available 24 hours/day Type of Home: House Home Access: Stairs to enter Entrance Stairs-Number of Steps: 2+1 Home Layout: Two level, Able to live on main level with bedroom/bathroom Bathroom Shower/Tub: Tub/shower unit (+seat) Bathroom Toilet: Handicapped height Bathroom Accessibility: Yes  Lives With: Daughter Sheila Andrade) IADL History Homemaking Responsibilities: No Occupation: Retired Type of Occupation: Pharmacist, hospital, Chartered loss adjuster + journalism for 29 years Leisure and Hobbies: Reading Prior Function Level of Independence: Needs assistance with ADLs, Requires assistive device for independence (Pt reported that her daughter Sheila Andrade provided considerable assistance during ADL routine, also provided Min-CGA during functional mobility using RW) Vision Patient Visual Report: No change from baseline Vision Assessment?: Vision impaired- to be further tested in functional context (?visual deficits though pt reports no difference in vision since hospital admission. Pt would often ask OT where items were in the room when they were beside her/in front of her) Perception  Perception: Within Functional  Limits Praxis Praxis: Impaired Praxis Impairment Details: Motor planning Cognition Overall Cognitive Status: No family/caregiver present to determine baseline cognitive functioning Arousal/Alertness: Awake/alert Orientation Level: Person;Place;Situation (for situation, pt stated she "tumbled" and "got sick" but unable to provide further details regarding hospital admission) Person: Oriented Place: Oriented Situation: Oriented Year: 2022 Month: July Day of Week: Correct Memory: Impaired Immediate Memory Recall: Sock;Blue;Bed Memory Recall Sock: Without Cue Memory Recall Blue: Without Cue Memory Recall Bed: Without Cue Problem Solving: Impaired Sensation Sensation Light Touch: Appears Intact Coordination Gross Motor Movements are Fluid and Coordinated: No Fine Motor Movements are Fluid and Coordinated: No Coordination and Movement Description: Parkinsonian movement patterns- bradykinesic, tremulous, and shuffling steps Finger Nose Finger Test: Mildly tremulous bilaterally Motor  Motor Motor: Abnormal postural alignment and control Motor - Skilled Clinical Observations: Parkinsonian presentation with posterior bias in standing, deficits with motor planning  Trunk/Postural Assessment  Cervical Assessment Cervical Assessment: Exceptions to [facility] (forward head) Thoracic Assessment Thoracic Assessment: Exceptions to [facility] (kyphotic) Lumbar Assessment Lumbar Assessment: Exceptions to [facility] (posterior pelvic tilt) Postural Control Postural Control: Deficits on evaluation (decreased in standing, needs unilateral support on RW to maintain standing balance during functional activity, posterior bias in standing)  Balance Balance Balance Assessed: Yes Dynamic Sitting Balance Dynamic Sitting - Balance Support: Feet supported Dynamic Sitting - Level of Assistance: 4: Min assist (attempting to doff gripper sock) Dynamic Standing Balance Dynamic Standing - Balance Support: Left upper  extremity supported;During functional activity Dynamic Standing - Level of Assistance: 4: Min assist;3: Mod assist (clothing mgt during toileting tasks) Dynamic Standing - Balance Activities: Lateral lean/weight shifting;Forward lean/weight shifting Extremity/Trunk Assessment RUE Assessment RUE Assessment: Exceptions to [facility] Active Range of Motion (AROM) Comments: Shoulder ROM limited to ~90 degrees LUE Assessment LUE Assessment: Exceptions to [facility] Active Range of Motion (AROM) Comments: Shoulder ROM limited to <90 degrees, hx RTC injury  Care Tool Care Tool Self Care Eating  Not assessed      Oral Care    Oral Care Assist Level: Supervision/Verbal cueing    Bathing   Body parts bathed by patient: Right arm;Left arm;Chest;Abdomen;Front perineal area;Right upper leg;Left upper leg;Face Body parts bathed by helper: Buttocks;Right lower leg;Left lower leg   Assist Level: Moderate Assistance - Patient 50 - 74%    Upper Body Dressing(including orthotics)   What is the patient wearing?: Button up shirt   Assist Level: Supervision/Verbal cueing    Lower Body Dressing (excluding footwear)     Assist for lower body  dressing: Maximal Assistance - Patient 25 - 49%    Putting on/Taking off footwear   What is the patient wearing?: Non-skid slipper socks Assist for footwear: Total Assistance - Patient < 25%       Care Tool Toileting Toileting activity   Assist for toileting: Maximal Assistance - Patient 25 - 49%      Toilet transfer   Assist Level: Moderate Assistance - Patient 50 - 74%       Refer to Care Plan for Long Term Goals  SHORT TERM GOAL WEEK 1 OT Short Term Goal 1 (Week 1): Pt will complete 1/3 components of toileting with CGA for balance OT Short Term Goal 2 (Week 1): Pt will complete 2 grooming tasks while standing in front of the sink to increase standing endurance OT Short Term Goal 3 (Week 1): Pt will complete LB dressing with Mod A using adaptive techniques/AE  as needed  Recommendations for other services: Therapeutic Recreation  Pet therapy   Skilled Therapeutic Intervention Skilled OT session completed with focus on initial evaluation, education on OT role/POC, and establishment of patient-centered goals.   Pt greeted in bed with no c/o pain, in care of RN to receive morning medication. Pt agreeable to engage in bathing/dressing tasks during session, transitioned to EOB with Mod A. ADLs completed at sit<stand level using RW, Min-Mod A for power ups with pt exhibiting posterior lean/LOBs in static + dynamic standing, often reached out towards sink to keep herself forward. Responded well to visual cue of moving hips closer towards the front bar of the walker. Max A for LB self care overall, pt limited by LE weakness + B hand tremors. She did manage to fasten all buttons of her shirt with increased time and vcs. Min-Mod A for stand pivot<BSC where pt had B+B void. Max A for toileting tasks with limited standing endurance noted.She then returned to EOB to complete hand washing/oral care. Pt with bradykinesic, tremulous motor movements and slow shuffling steps during functional activity + transfers today. At end of session pt opted to return to bed vs transfer OOB due to fatigue. Left her with all needs within reach and bed alarm set.   ADL ADL Eating: Not assessed Grooming: Supervision/safety Where Assessed-Grooming: Edge of bed Upper Body Bathing: Supervision/safety Where Assessed-Upper Body Bathing: Edge of bed Lower Body Bathing: Moderate assistance Where Assessed-Lower Body Bathing: Edge of bed Upper Body Dressing: Supervision/safety Where Assessed-Upper Body Dressing: Edge of bed Lower Body Dressing: Dependent Where Assessed-Lower Body Dressing: Edge of bed Toileting: Maximal assistance Where Assessed-Toileting: Bedside Commode Toilet Transfer: Moderate assistance Toilet Transfer Method: Stand pivot (RW) Toilet Transfer Equipment: Research officer, political party: Not assessed Mobility   Min-Mod A sit<stand   Discharge Criteria: Patient will be discharged from OT if patient refuses treatment 3 consecutive times without medical reason, if treatment goals not met, if there is a change in medical status, if patient makes no progress towards goals or if patient is discharged from hospital.  The above assessment, treatment plan, treatment alternatives and goals were discussed and mutually agreed upon: by patient  Skeet Simmer 08/10/2020, 12:19 PM

## 2020-08-10 DIAGNOSIS — R5381 Other malaise: Secondary | ICD-10-CM | POA: Diagnosis not present

## 2020-08-10 MED ORDER — POLYETHYLENE GLYCOL 3350 17 G PO PACK
17.0000 g | PACK | Freq: Every day | ORAL | Status: DC
  Administered 2020-08-16 – 2020-08-21 (×3): 17 g via ORAL
  Filled 2020-08-10 (×10): qty 1

## 2020-08-10 MED ORDER — SENNOSIDES-DOCUSATE SODIUM 8.6-50 MG PO TABS
1.0000 | ORAL_TABLET | Freq: Every day | ORAL | Status: DC
  Administered 2020-08-10 – 2020-08-22 (×8): 1 via ORAL
  Filled 2020-08-10 (×13): qty 1

## 2020-08-10 NOTE — Progress Notes (Signed)
Patient slept well throughout the night. Denies pain. Refused PVRs, voided x3. States "my doctor told me I can refuse, that is my right". Pt informed of indication of PVRs, however pt continued to refuse. No complaints verbalized this shift.

## 2020-08-10 NOTE — Plan of Care (Signed)
  Problem: RH Balance Goal: LTG Patient will maintain dynamic standing with ADLs (OT) Description: LTG:  Patient will maintain dynamic standing balance with assist during activities of daily living (OT)  Flowsheets (Taken 08/10/2020 1223) LTG: Pt will maintain dynamic standing balance during ADLs with: Minimal Assistance - Patient > 75%   Problem: Sit to Stand Goal: LTG:  Patient will perform sit to stand in prep for activites of daily living with assistance level (OT) Description: LTG:  Patient will perform sit to stand in prep for activites of daily living with assistance level (OT) Flowsheets (Taken 08/10/2020 1223) LTG: PT will perform sit to stand in prep for activites of daily living with assistance level: Contact Guard/Touching assist   Problem: RH Bathing Goal: LTG Patient will bathe all body parts with assist levels (OT) Description: LTG: Patient will bathe all body parts with assist levels (OT) Flowsheets (Taken 08/10/2020 1223) LTG: Pt will perform bathing with assistance level/cueing: Minimal Assistance - Patient > 75%   Problem: RH Dressing Goal: LTG Patient will perform lower body dressing w/assist (OT) Description: LTG: Patient will perform lower body dressing with assist, with/without cues in positioning using equipment (OT) Flowsheets (Taken 08/10/2020 1223) LTG: Pt will perform lower body dressing with assistance level of: Minimal Assistance - Patient > 75%   Problem: RH Toileting Goal: LTG Patient will perform toileting task (3/3 steps) with assistance level (OT) Description: LTG: Patient will perform toileting task (3/3 steps) with assistance level (OT)  Flowsheets (Taken 08/10/2020 1223) LTG: Pt will perform toileting task (3/3 steps) with assistance level: Minimal Assistance - Patient > 75%   Problem: RH Toilet Transfers Goal: LTG Patient will perform toilet transfers w/assist (OT) Description: LTG: Patient will perform toilet transfers with assist, with/without cues using  equipment (OT) Flowsheets (Taken 08/10/2020 1223) LTG: Pt will perform toilet transfers with assistance level of: Minimal Assistance - Patient > 75%   Problem: RH Tub/Shower Transfers Goal: LTG Patient will perform tub/shower transfers w/assist (OT) Description: LTG: Patient will perform tub/shower transfers with assist, with/without cues using equipment (OT) Flowsheets (Taken 08/10/2020 1223) LTG: Pt will perform tub/shower stall transfers with assistance level of: Minimal Assistance - Patient > 75%

## 2020-08-10 NOTE — Evaluation (Signed)
Speech Language Pathology Assessment and Plan  Patient Details  Name: Sheila Andrade MRN: [id number] Date of Birth: [birth year]  SLP Diagnosis: Cognitive Impairments  Rehab Potential: Good ELOS: 14-16 days   Today's Date: 08/10/2020 SLP Individual Time:  74 minutes    Hospital Problem: Principal Problem:   Debility  Past Medical History:  Past Medical History:  Diagnosis Date   Hot flashes 06/06/2020   Hypertension    Past Surgical History: No past surgical history on file.  Assessment / Plan / Recommendation Clinical Impression Sheila Andrade is an 85 year old female with history of HTN, pelvic fx 01/2020 with progressive decline complicated by Covid infection, anorexia with wt loss, severe recurrent depression, recurrent UTIs who was originally admitted to [facility] on 07/14/20 with falls, shuffling gait with parkinsonian type symptoms, SI due to severe depression and FTT. Neurology and psychiatry evaluated patient felt that Parkinsonism with negative work up and felt that tremors likely due to medication effect from Abilify and outpatient NCS/EMG recommended to evaluate BLE weakness. She was weaned off Effexor to Prozac, vitamin B12 supplemented and megace added to help with anorexia. She was admitted to CIR on 07/29 due to functional deficits from debility. On 08/02, she developed hypotension with asymptomatic SVT and was transferred to telemetry for monitoring and further work up. She was started on metoprolol which has been titrated up for rate control. Dr. Warren Lacy following for input and has educated patient/family on use of Vagal maneuvers. Patient continues to exhibit weakness decreased balance and decline in functional status. She was cleared to resume her rehab program and is returning today.    Patient was administered the [facility] Mental Status (SLUMS): 23/30 (score 27+ considered normal based on patient's educational hx). Results suggestive of mild cognitive-linguistic  impairment characterized by decreased orientation to time, attention, working memory, short-term recall, information processing, higher level problem solving and executive functions. Please see below:  Orientation: 2/3 (oriented to person, place, year, situation. Disoriented to day of week and month) Word list recall: 2/5 (recalled 5/5 with semantic cue) Calculations: 3/3 (required repetition to process) Generative Naming: 2/3 Mental manipulation/digit reversal: 2/2 Clock Drawing/Executive Function: 2/4 (poor number spacing) Visuospatial: 2/2 Paragraph Recall: 8/8  Additional informal cognitive assessment completed revealed: Trail making: WFL Following 1,2,3 and complex commands: WFL Abstract Reasoning: WFL Judgement/Reasoning: WFL   Patient's auditory comprehension and verbal expression appeared grossly intact. Motor speech production was clear without indication or concerns for dysarthria. Patient endorsed she is functioning below baseline from a cognitive perspective. She is tolerating a regular diet and thin liquids with no reported complaints. Taking pills whole with water or applesauce depending on the size of the pill. Patient is highly educated with a Designer, jewellery in education. Presented with flat affect during eval and hx of depression. She lives with her daughter and was managing medications and finances independently at prior level. Patient would benefit from skilled SLP intervention to maximize cognitive-linguistic function and functional independence prior to discharge.     Skilled Therapeutic Interventions          Administered SLUMS and further informal cognitive-linguistic and assessment. Educated patient on results of assessment and recommendations for skilled ST services while in CIR. Patient verbalized understanding and agreement with plan.  SLP Assessment  Patient will need skilled Speech Lanaguage Pathology Services during CIR admission    Recommendations  SLP Diet  Recommendations: Age appropriate regular solids;Thin Medication Administration: Whole meds with liquid Supervision: Patient able to self feed Compensations: Slow rate;Small  sips/bites Postural Changes and/or Swallow Maneuvers: Seated upright 90 degrees;Upright 30-60 min after meal Oral Care Recommendations: Oral care BID Recommendations for Other Services: Neuropsych consult Patient destination: Home Follow up Recommendations: Other (comment) (TBD) Equipment Recommended: None recommended by SLP    SLP Frequency 3 to 5 out of 7 days   SLP Duration  SLP Intensity  SLP Treatment/Interventions 14-16 days  Minumum of 1-2 x/day, 30 to 90 minutes  Cognitive remediation/compensation;Internal/external aids;Cueing hierarchy;Functional tasks;Medication managment;Patient/family education    Pain Pain Assessment Pain Scale: 0-10 Pain Score: 0-No pain  Prior Functioning Type of Home: House  Lives With: Daughter Available Help at Discharge: Available 24 hours/day Vocation: Retired  SLP Evaluation Cognition Overall Cognitive Status: Impaired/Different from baseline Arousal/Alertness: Awake/alert Orientation Level: Oriented to person;Disoriented to time;Oriented to situation;Oriented to place Attention: Sustained;Selective Sustained Attention: Appears intact Selective Attention: Impaired Selective Attention Impairment: Verbal basic;Functional basic Memory: Impaired Memory Impairment: Decreased recall of new information;Decreased short term memory Decreased Short Term Memory: Verbal basic Awareness: Impaired Awareness Impairment: Anticipatory impairment Problem Solving: Impaired Problem Solving Impairment: Verbal complex Executive Function: Organizing;Self Monitoring Organizing: Impaired Organizing Impairment: Functional basic Initiating: Impaired Initiating Impairment: Functional basic Self Monitoring: Impaired Self Monitoring Impairment: Functional basic Self Correcting:  Impaired Self Correcting Impairment: Functional basic Safety/Judgment: Appears intact  Comprehension Auditory Comprehension Overall Auditory Comprehension: Appears within functional limits for tasks assessed Expression Expression Primary Mode of Expression: Verbal Verbal Expression Overall Verbal Expression: Appears within functional limits for tasks assessed Oral Motor Oral Motor/Sensory Function Overall Oral Motor/Sensory Function: Within functional limits Motor Speech Overall Motor Speech: Appears within functional limits for tasks assessed  Care Tool Care Tool Cognition Expression of Ideas and Wants Expression of Ideas and Wants: Some difficulty - exhibits some difficulty with expressing needs and ideas (e.g, some words or finishing thoughts) or speech is not clear   Understanding Verbal and Non-Verbal Content Understanding Verbal and Non-Verbal Content: Usually understands - understands most conversations, but misses some part/intent of message. Requires cues at times to understand   Memory/Recall Ability *first 3 days only Memory/Recall Ability *first 3 days only: Current season;That he or she is in a hospital/hospital unit    Short Term Goals: Week 1: SLP Short Term Goal 1 (Week 1): Patient will demonstrate selective attention to functional tasks in mild-to-moderately distracting environment with supervision verbal cues for redirection SLP Short Term Goal 2 (Week 1): Patient will complete mildly complex problem solving tasks with min A verbal cues SLP Short Term Goal 3 (Week 1): Patient will recall new, daily information with sup A verbal cues with use of compensatory memory strategies SLP Short Term Goal 4 (Week 1): Patient will complete functional medication management tasks with 90% accuracy and sup A verbal cues  Refer to Care Plan for Long Term Goals  Recommendations for other services: Neuropsych  Discharge Criteria: Patient will be discharged from SLP if patient  refuses treatment 3 consecutive times without medical reason, if treatment goals not met, if there is a change in medical status, if patient makes no progress towards goals or if patient is discharged from hospital.  The above assessment, treatment plan, treatment alternatives and goals were discussed and mutually agreed upon: by patient  Patty Sermons, M.S., [place] T [place] 08/10/2020, 5:03 PM

## 2020-08-10 NOTE — Plan of Care (Signed)
  Problem: RH Cognition - SLP Goal: RH LTG Patient will demonstrate orientation with cues Description:  LTG:  Patient will demonstrate orientation to person/place/time/situation with cues (SLP)   Flowsheets (Taken 08/10/2020 1239) LTG Patient will demonstrate orientation to:  Person  Place  Time  Situation LTG: Patient will demonstrate orientation using cueing (SLP): Modified Independent   Problem: RH Problem Solving Goal: LTG Patient will demonstrate problem solving for (SLP) Description: LTG:  Patient will demonstrate problem solving for basic/complex daily situations with cues  (SLP) Flowsheets (Taken 08/10/2020 1239) LTG: Patient will demonstrate problem solving for (SLP): Complex daily situations LTG Patient will demonstrate problem solving for: Supervision   Problem: RH Memory Goal: LTG Patient will demonstrate ability for day to day (SLP) Description: LTG:   Patient will demonstrate ability for day to day recall/carryover during cognitive/linguistic activities with assist  (SLP) Flowsheets (Taken 08/10/2020 1239) LTG: Patient will demonstrate ability for day to day recall:  Biographical information  New information LTG: Patient will demonstrate ability for day to day recall/carryover during cognitive/linguistic activities with assist (SLP): Modified Independent Goal: LTG Patient will use memory compensatory aids to (SLP) Description: LTG:  Patient will use memory compensatory aids to recall biographical/new, daily complex information with cues (SLP) Flowsheets (Taken 08/10/2020 1239) LTG: Patient will use memory compensatory aids to (SLP): Modified Independent   Problem: RH Attention Goal: LTG Patient will demonstrate this level of attention during functional activites (SLP) Description: LTG:  Patient will will demonstrate this level of attention during functional activites (SLP) Flowsheets (Taken 08/10/2020 1239) Patient will demonstrate during cognitive/linguistic activities the  attention type of: Selective LTG: Patient will demonstrate this level of attention during cognitive/linguistic activities with assistance of (SLP): Modified Independent

## 2020-08-10 NOTE — Evaluation (Signed)
Physical Therapy Assessment and Plan  Patient Details  Name: Sheila Andrade MRN: [id number] Date of Birth: 28-Dec-1935  PT Diagnosis: Abnormal posture, Abnormality of gait, Impaired cognition, and Muscle weakness Rehab Potential: Good ELOS: 14 to 16 days   Today's Date: 08/10/2020 PT Individual Time: 0177-9390 PT Individual Time Calculation (min): 58 min    Hospital Problem: Principal Problem:   Debility   Past Medical History:  Past Medical History:  Diagnosis Date   Hot flashes 06/06/2020   Hypertension    Past Surgical History: No past surgical history on file.  Assessment & Plan Clinical Impression: Patient is an 85 year old female with history of HTN, pelvic fx 01/2020 with progressive decline complicated by Covid infection, anorexia with wt loss, severe recurrent depression, recurrent UTIs who was originally admitted to [facility] on 07/14/20 with falls, shuffling gait with parkinsonian type symptoms, SI due to severe depression and FTT. Neurology and psychiatry evaluated patient felt that Parkinsonism with negative work up and felt that tremors likely due to medication effect from Abilify and outpatient NCS/EMG recommended to evaluate BLE weakness. She was weaned off Effexor to Prozac, vitamin B12 supplemented and megace added to help with anorexia.   She was admitted to CIR on 07/29 due to functional deficits from debility. On 08/02, she developed hypotension with asymptomatic SVT and was transferred to telemetry for monitoring and further work up. She was started on metoprolol which has been titrated up for rate control. Dr. Warren Lacy following for input and has educated patient/family on use of Vagal maneuvers. Patient continues to exhibit weakness decreased balance and decline in functional status. She was cleared to resume her rehab program and is returning today.   Patient transferred to CIR on 08/09/2020 .   Patient currently requires mod with mobility secondary to muscle weakness,  decreased cardiorespiratoy endurance, abnormal tone and motor apraxia, and decreased safety awareness and decreased memory.  Prior to hospitalization, patient was supervision with mobility and lived with Daughter in a House home.  Home access is 2 stairs up onto landing then 1 step into houseStairs to enter.  Patient will benefit from skilled PT intervention to maximize safe functional mobility, minimize fall risk, and decrease caregiver burden for planned discharge home with 24 hour assist.  Anticipate patient will benefit from follow up [facility] at discharge.  PT - End of Session Activity Tolerance: Tolerates 10 - 20 min activity with multiple rests Endurance Deficit: Yes Endurance Deficit Description: Decreased tolerance to standing and general activity during self care participation PT Assessment Rehab Potential (ACUTE/IP ONLY): Good PT Barriers to Discharge: Inaccessible home environment;Home environment access/layout PT Patient demonstrates impairments in the following area(s): Balance;Endurance;Motor;Safety PT Transfers Functional Problem(s): Bed Mobility;Bed to Chair;Car PT Locomotion Functional Problem(s): Ambulation;Stairs;Wheelchair Mobility PT Plan PT Intensity: Minimum of 1-2 x/day ,45 to 90 minutes PT Frequency: 5 out of 7 days PT Duration Estimated Length of Stay: 14 to 16 days PT Treatment/Interventions: Ambulation/gait training;Balance/vestibular training;Discharge planning;DME/adaptive equipment instruction;Functional mobility training;Neuromuscular re-education;Patient/family education;Stair training;Therapeutic Activities;UE/LE Strength taining/ROM;UE/LE Coordination activities;Therapeutic Exercise;Visual/perceptual remediation/compensation;Wheelchair propulsion/positioning PT Transfers Anticipated Outcome(s): c/g with LRAD PT Locomotion Anticipated Outcome(s): c/g with LRAD PT Recommendation Follow Up Recommendations: Home health PT Patient destination: Home Equipment  Recommended: To be determined   PT Evaluation Precautions/Restrictions Precautions Precautions: Fall Precaution Comments: Posterior lean in standing Restrictions Weight Bearing Restrictions: No General Chart Reviewed: Yes Family/Caregiver Present: Yes (daughter present at beginning of evaluation) Vital SignsTherapy  Pain Pain Assessment Pain Scale: 0-10 Pain Score: 0-No pain Home Living/Prior Functioning  Home Living Available Help at Discharge: Available 24 hours/day Type of Home: House Home Access: Stairs to enter [employer] of Steps: 2 stairs up onto landing then 1 step into house Entrance Stairs-Rails: None Home Layout: Two level;Able to live on main level with bedroom/bathroom Bathroom Shower/Tub: Tub/shower unit (+seat) Bathroom Toilet: Handicapped height Bathroom Accessibility: Yes Additional Comments: hx of pelvic fx at beginning of year, lived with daughter since  Lives With: Daughter Prior Function Level of Independence: Requires assistive device for independence  Able to Take Stairs?: Yes Vocation: Retired Art gallery manager: [employer] Praxis: Impaired Praxis Impairment Details: Engineer, production Overall Cognitive Status: Impaired/Different from baseline Arousal/Alertness: Awake/alert Orientation Level:  (generally oriented) Attention: Sustained Sustained Attention: Impaired Sustained Attention Impairment: Functional basic Memory: Impaired Memory Impairment: Decreased recall of new information;Decreased short term memory Awareness: Impaired Awareness Impairment: Anticipatory impairment Problem Solving: Impaired Safety/Judgment: Impaired Sensation Sensation Light Touch:  (grossly intact B LEs) Coordination Gross Motor Movements are Fluid and Coordinated: No Fine Motor Movements are Fluid and Coordinated: No Coordination and Movement Description: Parkinsonian movement patterns-  bradykinesic, tremulous, and shuffling steps Motor  Motor Motor: Abnormal postural alignment and control Motor - Skilled Clinical Observations: Parkinsonian presentation with posterior bias in standing, deficits with motor planning   Trunk/Postural Assessment  Cervical Assessment Cervical Assessment: Exceptions to [facility] Thoracic Assessment Thoracic Assessment: Exceptions to [facility] Lumbar Assessment Lumbar Assessment: Exceptions to [facility] Postural Control Postural Control: Deficits on evaluation  Balance Balance Balance Assessed: Yes Static Sitting Balance Static Sitting - Balance Support: Bilateral upper extremity supported;Feet supported Static Sitting - Level of Assistance: 5: Stand by assistance Static Standing Balance Static Standing - Balance Support: Bilateral upper extremity supported;During functional activity Static Standing - Level of Assistance: 3: Mod assist Dynamic Standing Balance Dynamic Standing - Balance Support: During functional activity Dynamic Standing - Level of Assistance: 3: Mod assist Dynamic Standing - Balance Activities: Lateral lean/weight shifting;Forward lean/weight shifting Extremity Assessment  B UEs as per OT evaluation RLE Assessment RLE Assessment: Exceptions to [facility] Passive Range of Motion (PROM) Comments: decreased ROM DF General Strength Comments: grossly 3/5 LLE Assessment LLE Assessment: Exceptions to [facility] Passive Range of Motion (PROM) Comments: decreased ROM DF General Strength Comments: grossly 3/5  Care Tool Care Tool Bed Mobility Roll left and right activity        Sit to lying activity        Lying to sitting edge of bed activity         Care Tool Transfers Sit to stand transfer      Mod A   Chair/bed transfer    Mod A      Toilet transfer   Assist Level: Moderate Assistance - Patient 50 - 74%    Car transfer    Mod A to max A       Care Tool Locomotion Ambulation    Mod A with RW about 50 feet      Walk 10 feet  activity    Mod A with RW     Walk 50 feet with 2 turns activity    Mod A with RW    Walk 150 feet activity        Walk 10 feet on uneven surfaces activity        Stairs    Mod A 4 stairs with B rails      Walk up/down 1 step activity    Mod A 4 stairs with B rails  Walk up/down 4 steps activity    Mod A 4 stairs with B rails  Walk up/down 12 steps activity        Pick up small objects from floor        Wheelchair            Wheel 50 feet with 2 turns activity      Wheel 150 feet activity        Refer to Care Plan for Long Term Goals  SHORT TERM GOAL WEEK 1 PT Short Term Goal 1 (Week 1): Pt with increase bed mobility to S. PT Short Term Goal 2 (Week 1): Pt wil increase transfers to min A. PT Short Term Goal 3 (Week 1): Pt will ambulate with LRAD about 100 feet with min A. PT Short Term Goal 4 (Week 1): Pt will ascend/descend 4 stairs with B rails and min A. PT Short Term Goal 5 (Week 1): Pt will propel w/c about 50 feet with S.  Recommendations for other services: None   Skilled Therapeutic Intervention PT evaluation completed and treatment plan initiated. Pt performed multiple transfers with/without assistive devices and mod A with verbal cues. Pt returned to room and transferred back to bed with rolling walker and mod A with verbal cues. Pt transferred edge of bed to supine with min A and verbal cues. Pt left sitting up in bed with all needs within reach and bed alarm on.    Discharge Criteria: Patient will be discharged from PT if patient refuses treatment 3 consecutive times without medical reason, if treatment goals not met, if there is a change in medical status, if patient makes no progress towards goals or if patient is discharged from hospital.  The above assessment, treatment plan, treatment alternatives and goals were discussed and mutually agreed upon: by patient  Dub Amis 08/10/2020, 3:53 PM

## 2020-08-10 NOTE — Progress Notes (Signed)
PROGRESS NOTE   Subjective/Complaints: Pt states she had a reasonable night. Appetite fair, improving. Does complain of constipation  ROS: Patient denies fever, rash, sore throat, blurred vision, nausea, vomiting, diarrhea, cough, shortness of breath or chest pain, joint or back pain, headache    Objective:   No results found. No results for input(s): WBC, HGB, HCT, PLT in the last 72 hours. No results for input(s): NA, K, CL, CO2, GLUCOSE, BUN, CREATININE, CALCIUM in the last 72 hours.  Intake/Output Summary (Last 24 hours) at 08/10/2020 1056 Last data filed at 08/09/2020 2100 Gross per 24 hour  Intake 180 ml  Output --  Net 180 ml     Pressure Injury 08/02/20 Buttocks Left Stage 1 -  Intact skin with non-blanchable redness of a localized area usually over a bony prominence. (Active)  08/02/20 1845  Location: Buttocks  Location Orientation: Left  Staging: Stage 1 -  Intact skin with non-blanchable redness of a localized area usually over a bony prominence.  Wound Description (Comments):   Present on Admission: Yes    Physical Exam: Vital Signs Blood pressure 124/63, pulse 64, temperature 98.6 F (37 C), temperature source Oral, resp. rate 18, height 5\' 7"  (1.702 m), weight 55.1 kg, SpO2 98 %.  General: Alert and oriented x 3, No apparent distress, frail appearing HEENT: Head is normocephalic, atraumatic, PERRLA, EOMI, sclera anicteric, oral mucosa pink and moist, dentition intact, ext ear canals clear,  Neck: Supple without JVD or lymphadenopathy Heart: Reg rate and rhythm. No murmurs rubs or gallops Chest: CTA bilaterally without wheezes, rales, or rhonchi; no distress Abdomen: Soft, non-tender, non-distended, bowel sounds positive. Extremities: No clubbing, cyanosis, or edema. Pulses are 2+ Psych: flat but engaging more. Skin: pale, warm, left buttock wound Neuro:  Alert and oriented x 3. Normal insight and  awareness. Intact Memory. Normal language and speech. Cranial nerve exam unremarkable. UE 4+/5. LE 3-4/5 prox to distal. No sensory findings. Mild tremor.  Musculoskeletal: normal ROM    Assessment/Plan: 1. Functional deficits which require 3+ hours per day of interdisciplinary therapy in a comprehensive inpatient rehab setting. Physiatrist is providing close team supervision and 24 hour management of active medical problems listed below. Physiatrist and rehab team continue to assess barriers to discharge/monitor patient progress toward functional and medical goals  Care Tool:  Bathing              Bathing assist       Upper Body Dressing/Undressing Upper body dressing        Upper body assist      Lower Body Dressing/Undressing Lower body dressing            Lower body assist       Toileting Toileting    Toileting assist Assist for toileting: Moderate Assistance - Patient 50 - 74%     Transfers Chair/bed transfer  Transfers assist           Locomotion Ambulation   Ambulation assist              Walk 10 feet activity   Assist           Walk 50 feet activity  Assist           Walk 150 feet activity   Assist           Walk 10 feet on uneven surface  activity   Assist           Wheelchair     Assist               Wheelchair 50 feet with 2 turns activity    Assist            Wheelchair 150 feet activity     Assist          Blood pressure 124/63, pulse 64, temperature 98.6 F (37 C), temperature source Oral, resp. rate 18, height 5\' 7"  (1.702 m), weight 55.1 kg, SpO2 98 %.  Medical Problem List and Plan: 1.  Functional deficits secondary to debility, gait disorder             -patient may shower             -ELOS/Goals: 10-14 days, supervision to min assist with PT, OT and supervision with cognition  -Patient is beginning CIR therapies today including PT, ST, and OT  2.   Antithrombotics: -DVT/anticoagulation:  Pharmaceutical: Lovenox             -antiplatelet therapy: N/A 3. Pain Management: Tylenol prn.  4. Mood: Team to provide ego support. LCSW to follow for evaluation and support.              -antipsychotic agents: N/A 5. Neuropsych: This patient is capable of making decisions on her own behalf. 6. Skin/Wound Care: Routine pressure relief measures.  7. Fluids/Electrolytes/Protein deficient malnutrition: Monitor I/O. --Continue Ensure TID to help with malnutrition due to FTT. -intake much improved -continue megace which has helped her appetite 8. SVT: HR with metoprolol 12.5 mg TID             --patient/family educated on  use of Vagal maneuvers to reset to NSR. 9. Severe recurrent depression: Stable on prozac 10 mg daily. 11. Vitamin B 12 deficiency: On  oral supplement. 12. Frequency/Incontinence: Improving?.              -monitor voiding patterns 13. HTN: Monitor BP tid--controlled on Metoprolol.  14. Constipation: had a large bm this morning  -senokot-s at bedtime with daily miralax for now    LOS: 1 days A FACE TO FACE EVALUATION WAS PERFORMED  08/10/2020, 10:56 AM

## 2020-08-11 DIAGNOSIS — R5381 Other malaise: Secondary | ICD-10-CM | POA: Diagnosis not present

## 2020-08-11 MED ORDER — METOPROLOL TARTRATE 25 MG PO TABS
25.0000 mg | ORAL_TABLET | Freq: Two times a day (BID) | ORAL | Status: DC
  Administered 2020-08-11 – 2020-08-23 (×24): 25 mg via ORAL
  Filled 2020-08-11 (×24): qty 1

## 2020-08-11 NOTE — Progress Notes (Signed)
Patient refused bladder scan post void.

## 2020-08-11 NOTE — Progress Notes (Signed)
Progress Note  Patient Name: Sheila Andrade Date of Encounter: 08/11/2020  Primary Cardiologist: Rollene Rotunda, MD  Subjective   She has had no documented SVT since readmission to rehab.  She denies palpitations or dizziness.  No presyncope.  She just feels weak.     Inpatient Medications    Scheduled Meds:  brinzolamide  1 drop Left Eye BID   enoxaparin (LOVENOX) injection  40 mg Subcutaneous Daily   feeding supplement  237 mL Oral TID BM   FLUoxetine  10 mg Oral Daily   latanoprost  1 drop Left Eye QHS   megestrol  400 mg Oral Daily   metoprolol tartrate  12.5 mg Oral TID   multivitamin with minerals  1 tablet Oral Daily   pantoprazole  40 mg Oral Daily   polyethylene glycol  17 g Oral Daily   senna-docusate  1 tablet Oral QHS   simvastatin  20 mg Oral Daily   traZODone  25 mg Oral QHS   Continuous Infusions:  PRN Meds: acetaminophen, alum & mag hydroxide-simeth, bisacodyl, diphenhydrAMINE, guaiFENesin-dextromethorphan, polyethylene glycol, prochlorperazine **OR** prochlorperazine **OR** prochlorperazine, sodium phosphate   Vital Signs    Vitals:   08/10/20 1323 08/10/20 1929 08/11/20 0350 08/11/20 0832  BP: 109/67 106/64 (!) 147/82 110/67  Pulse: 65 66 (!) 59 63  Resp: 19 18 18    Temp: 98.9 F (37.2 C) 98.2 F (36.8 C) 98.4 F (36.9 C)   TempSrc: Oral Oral Oral   SpO2:  98% 100%   Weight:      Height:        Intake/Output Summary (Last 24 hours) at 08/11/2020 1019 Last data filed at 08/10/2020 2040 Gross per 24 hour  Intake 270 ml  Output --  Net 270 ml   Last 3 Weights 08/09/2020 08/09/2020 08/08/2020  Weight (lbs) 121 lb 7.6 oz 115 lb 15.4 oz 115 lb 1.3 oz  Weight (kg) 55.1 kg 52.6 kg 52.2 kg     Telemetry    Largely NSR but 2 brief recurrences of SVT around 4:30am and 6:58am, transient, asymptomatic- Personally Reviewed   Physical Exam   GEN: No  acute distress.   Psych: Oriented and appropriate    Flat affect     High Sensitivity Troponin:    Recent Labs  Lab 08/06/20 0452  TROPONINIHS 17      Cardiac EnzymesNo results for input(s): TROPONINI in the last 168 hours. No results for input(s): TROPIPOC in the last 168 hours.   Chemistry Recent Labs  Lab 08/05/20 0515 08/06/20 0119  NA 138  --   K 3.9 4.2  CL 108  --   CO2 20*  --   GLUCOSE 92  --   BUN 28*  --   CREATININE 0.87  --   CALCIUM 9.2  --   GFRNONAA >60  --   ANIONGAP 10  --      Hematology Recent Labs  Lab 08/05/20 0515  WBC 6.9  RBC 4.13  HGB 11.9*  HCT 36.7  MCV 88.9  MCH 28.8  MCHC 32.4  RDW 14.8  PLT 391    BNPNo results for input(s): BNP, PROBNP in the last 168 hours.   DDimer No results for input(s): DDIMER in the last 168 hours.   Radiology    No results found.  Cardiac Studies   2D echo 06/14/20 CareEverywhere  Summary    1. The left ventricle is normal in size with normal wall thickness.    2. The  left ventricular systolic function is normal, LVEF is visually  estimated at > 55%.    3. The right ventricle is normal in size, with normal systolic function.    4. There is mild aortic valve stenosis.   Patient Profile     85 y.o. female with mild aortic stenosis, HLD, HTN,  depression, GERD iron deficiency anemia, prior near-syncope, PSVT failure to thrive whom we are seeing for SVT. She was recently admitted 7/10-7/29/22 for functional decline, failure to thrive, decreased oral intake, bradykinesia, hyperreflexia, poor balance, malnutrition, weight loss, and severe depression with suicidal ideation. She required transfer to CIR for delibiltation. While at CIR she had an episode of SVT associated with hypotension so was transferred back to [facility] for cardiac monitoring.  Cardiology following for PSVT.  Assessment & Plan    PSVT associated with hypotension Can consolidate beta blocker to 25 bid and titrate upwards as needed.    Please call if she has further SVT.  We will otherwise sign off.    For questions or updates,  please contact [facility] Please consult [email or web address] for contact info under Cardiology/STEMI.  Signed, Rollene Rotunda, MD 08/11/2020, 10:19 AM

## 2020-08-11 NOTE — Progress Notes (Signed)
Occupational Therapy Session Note  Patient Details  Name: Sheila Andrade MRN: [id number] Date of Birth: Mar 29, 1935  Today's Date: 08/12/2020 OT Individual Time: 4034-7425 OT Individual Time Calculation (min): 59 min   Short Term Goals: Week 1:  OT Short Term Goal 1 (Week 1): Pt will complete 1/3 components of toileting with CGA for balance OT Short Term Goal 2 (Week 1): Pt will complete 2 grooming tasks while standing in front of the sink to increase standing endurance OT Short Term Goal 3 (Week 1): Pt will complete LB dressing with Mod A using adaptive techniques/AE as needed  Skilled Therapeutic Interventions/Progress Updates:    Pt greeted in the w/c with no c/o pain. Finished with lunch. She declined starting session with toilet transfer. Escorted pt via w/c to the dayroom. To work on UE/LE coordination, sitting/standing balance, and standing endurance pt participated in wii Just dance. Pt had to have each song played twice due to deficits with motor planning and decreased speed of initiating motor movements. Always did better on 2nd song. Progressed to having pt sit unsupported in the w/c for increasing trunk challenges, also to having pt perform dance exercises in standing with unilateral support on the RW. Pt also worked on LE coordination while holding onto the walker with both hands. Min A for dynamic balance and Min A for sit<stands with cues for "nose over toes." Pt reported feeling "exhausted" after dancing. Transitioned to light seated activity, pt "washing" table with therapeutic focus on anterior weight shifts for functional carryover during sit<stands. Note pt exhibits posterior bias during sit<stands and standing. Also worked on gentle Lt shoulder mobility at this time due to past RTC injury and limited ROM. Pt was then returned to the room and completed sit<stand with RW and CGA, increased time for motor planning and moving hands from w/c armrests to the walker. Min A for ambulatory  transfer to bed and Max A to return to supine position due to posterior LOBs EOB. Left pt with all needs within reach and bed alarm set.   Therapy Documentation Precautions:  Precautions Precautions: Fall Precaution Comments: Posterior lean in standing Restrictions Weight Bearing Restrictions: No  Vital Signs: Therapy Vitals Temp: 98.2 F (36.8 C) Temp Source: Oral Pulse Rate: 63 Resp: 18 BP: 113/66 Patient Position (if appropriate): Lying Oxygen Therapy SpO2: 98 % O2 Device: Room Air  ADL: ADL Eating: Not assessed Grooming: Supervision/safety Where Assessed-Grooming: Edge of bed Upper Body Bathing: Supervision/safety Where Assessed-Upper Body Bathing: Edge of bed Lower Body Bathing: Moderate assistance Where Assessed-Lower Body Bathing: Edge of bed Upper Body Dressing: Supervision/safety Where Assessed-Upper Body Dressing: Edge of bed Lower Body Dressing: Dependent Where Assessed-Lower Body Dressing: Edge of bed Toileting: Maximal assistance Where Assessed-Toileting: Bedside Commode Toilet Transfer: Moderate assistance Toilet Transfer Method: Stand pivot (RW) Toilet Transfer Equipment: Bedside commode Tub/Shower Transfer: Not assessed    Therapy/Group: Individual Therapy  Trevan Messman A Norena Bratton 08/12/2020, 4:03 PM

## 2020-08-11 NOTE — Progress Notes (Signed)
PROGRESS NOTE   Subjective/Complaints: Says she had positive day with therapy. Seems disappointed to be off today. Reports reasonable appetite. Pain controlled  ROS: Patient denies fever, rash, sore throat, blurred vision, nausea, vomiting, diarrhea, cough, shortness of breath or chest pain, joint or back pain, headache .   Objective:   No results found. No results for input(s): WBC, HGB, HCT, PLT in the last 72 hours. No results for input(s): NA, K, CL, CO2, GLUCOSE, BUN, CREATININE, CALCIUM in the last 72 hours.  Intake/Output Summary (Last 24 hours) at 08/11/2020 3817 Last data filed at 08/10/2020 2040 Gross per 24 hour  Intake 270 ml  Output --  Net 270 ml     Pressure Injury 08/02/20 Buttocks Left Stage 1 -  Intact skin with non-blanchable redness of a localized area usually over a bony prominence. (Active)  08/02/20 1845  Location: Buttocks  Location Orientation: Left  Staging: Stage 1 -  Intact skin with non-blanchable redness of a localized area usually over a bony prominence.  Wound Description (Comments):   Present on Admission: Yes    Physical Exam: Vital Signs Blood pressure 110/67, pulse 63, temperature 98.4 F (36.9 C), temperature source Oral, resp. rate 18, height 5\' 7"  (1.702 m), weight 55.1 kg, SpO2 100 %.  Constitutional: No distress . Vital signs reviewed. frail HEENT: NCAT, EOMI, oral membranes moist Neck: supple Cardiovascular: RRR without murmur. No JVD    Respiratory/Chest: CTA Bilaterally without wheezes or rales. Normal effort    GI/Abdomen: BS +, non-tender, non-distended Ext: no clubbing, cyanosis, or edema Psych: a little more flat today Skin: pale, warm, left buttock wound Neuro:  Alert and oriented x 3. Normal insight and awareness. Intact Memory. Normal language and speech. Cranial nerve exam unremarkable. UE 4+/5. LE 3-4/5 prox to distal. No sensory findings. Mild tremor.   Musculoskeletal: normal ROM    Assessment/Plan: 1. Functional deficits which require 3+ hours per day of interdisciplinary therapy in a comprehensive inpatient rehab setting. Physiatrist is providing close team supervision and 24 hour management of active medical problems listed below. Physiatrist and rehab team continue to assess barriers to discharge/monitor patient progress toward functional and medical goals  Care Tool:  Bathing    Body parts bathed by patient: Right arm, Left arm   Body parts bathed by helper: Buttocks     Bathing assist Assist Level: Moderate Assistance - Patient 50 - 74%     Upper Body Dressing/Undressing Upper body dressing   What is the patient wearing?: Button up shirt    Upper body assist Assist Level: Supervision/Verbal cueing    Lower Body Dressing/Undressing Lower body dressing      What is the patient wearing?: Pants     Lower body assist Assist for lower body dressing: Maximal Assistance - Patient 25 - 49%     Toileting Toileting    Toileting assist Assist for toileting: Maximal Assistance - Patient 25 - 49%     Transfers Chair/bed transfer  Transfers assist     Chair/bed transfer assist level: Minimal Assistance - Patient > 75%     Locomotion Ambulation   Ambulation assist   Ambulation activity did not occur: Safety/medical  concerns  Assist level: Minimal Assistance - Patient > 75% Assistive device: Walker-rolling Max distance: 38ft   Walk 10 feet activity   Assist  Walk 10 feet activity did not occur: Safety/medical concerns  Assist level: Minimal Assistance - Patient > 75% Assistive device: Walker-rolling   Walk 50 feet activity   Assist Walk 50 feet with 2 turns activity did not occur: Safety/medical concerns  Assist level: Minimal Assistance - Patient > 75% Assistive device: Walker-rolling    Walk 150 feet activity   Assist Walk 150 feet activity did not occur: Safety/medical concerns          Walk 10 feet on uneven surface  activity   Assist Walk 10 feet on uneven surfaces activity did not occur: Safety/medical concerns         Wheelchair     Assist        Wheelchair assist level: Moderate Assistance - Patient 50 - 74% Max wheelchair distance: 50    Wheelchair 50 feet with 2 turns activity    Assist        Assist Level: Moderate Assistance - Patient 50 - 74%   Wheelchair 150 feet activity     Assist      Assist Level: Maximal Assistance - Patient 25 - 49%   Blood pressure 110/67, pulse 63, temperature 98.4 F (36.9 C), temperature source Oral, resp. rate 18, height 5\' 7"  (1.702 m), weight 55.1 kg, SpO2 100 %.  Medical Problem List and Plan: 1.  Functional deficits secondary to debility, gait disorder             -patient may shower             -ELOS/Goals: 10-14 days, supervision to min assist with PT, OT and supervision with cognition  -Patient is beginning CIR therapies today including PT, ST, and OT  2.  Antithrombotics: -DVT/anticoagulation:  Pharmaceutical: Lovenox             -antiplatelet therapy: N/A 3. Pain Management: Tylenol prn.  4. Mood: Team to provide ego support. LCSW to follow for evaluation and support.              -antipsychotic agents: N/A 5. Neuropsych: This patient is capable of making decisions on her own behalf. 6. Skin/Wound Care: Routine pressure relief measures.  7. Fluids/Electrolytes/Protein deficient malnutrition: Monitor I/O. --Continue Ensure TID to help with malnutrition due to FTT. -intake much improved, sometimes a little sporadic -continue megace which has helped  8. SVT: HR with metoprolol 12.5 mg TID             --patient/family educated on  use of Vagal maneuvers to reset to NSR. 9. Severe recurrent depression: Stable on prozac 10 mg daily. 11. Vitamin B 12 deficiency: On  oral supplement. 12. Frequency/Incontinence: Improving?.              -monitor voiding patterns 13. HTN: Monitor BP  tid--controlled on Metoprolol.  14. Constipation: had a large bm 8/6  -senokot-s at bedtime with daily miralax for now    LOS: 2 days A FACE TO FACE EVALUATION WAS PERFORMED  08/11/2020, 9:24 AM

## 2020-08-12 ENCOUNTER — Encounter (HOSPITAL_COMMUNITY): Payer: Self-pay | Admitting: Physical Medicine and Rehabilitation

## 2020-08-12 DIAGNOSIS — R5381 Other malaise: Secondary | ICD-10-CM | POA: Diagnosis not present

## 2020-08-12 LAB — CBC WITH DIFFERENTIAL/PLATELET
Abs Immature Granulocytes: 0.08 10*3/uL — ABNORMAL HIGH (ref 0.00–0.07)
Basophils Absolute: 0 10*3/uL (ref 0.0–0.1)
Basophils Relative: 1 %
Eosinophils Absolute: 0.1 10*3/uL (ref 0.0–0.5)
Eosinophils Relative: 1 %
HCT: 34.6 % — ABNORMAL LOW (ref 36.0–46.0)
Hemoglobin: 10.8 g/dL — ABNORMAL LOW (ref 12.0–15.0)
Immature Granulocytes: 1 %
Lymphocytes Relative: 33 %
Lymphs Abs: 2.6 10*3/uL (ref 0.7–4.0)
MCH: 28.6 pg (ref 26.0–34.0)
MCHC: 31.2 g/dL (ref 30.0–36.0)
MCV: 91.5 fL (ref 80.0–100.0)
Monocytes Absolute: 0.7 10*3/uL (ref 0.1–1.0)
Monocytes Relative: 9 %
Neutro Abs: 4.3 10*3/uL (ref 1.7–7.7)
Neutrophils Relative %: 55 %
Platelets: 307 10*3/uL (ref 150–400)
RBC: 3.78 MIL/uL — ABNORMAL LOW (ref 3.87–5.11)
RDW: 15.6 % — ABNORMAL HIGH (ref 11.5–15.5)
WBC: 7.9 10*3/uL (ref 4.0–10.5)
nRBC: 0 % (ref 0.0–0.2)

## 2020-08-12 LAB — COMPREHENSIVE METABOLIC PANEL
ALT: 18 U/L (ref 0–44)
AST: 16 U/L (ref 15–41)
Albumin: 2.9 g/dL — ABNORMAL LOW (ref 3.5–5.0)
Alkaline Phosphatase: 39 U/L (ref 38–126)
Anion gap: 7 (ref 5–15)
BUN: 27 mg/dL — ABNORMAL HIGH (ref 8–23)
CO2: 21 mmol/L — ABNORMAL LOW (ref 22–32)
Calcium: 9.1 mg/dL (ref 8.9–10.3)
Chloride: 109 mmol/L (ref 98–111)
Creatinine, Ser: 0.77 mg/dL (ref 0.44–1.00)
GFR, Estimated: >60 mL/min (ref >60)
Glucose, Bld: 92 mg/dL (ref 70–99)
Potassium: 4 mmol/L (ref 3.5–5.1)
Sodium: 137 mmol/L (ref 135–145)
Total Bilirubin: 0.4 mg/dL (ref 0.3–1.2)
Total Protein: 5.5 g/dL — ABNORMAL LOW (ref 6.5–8.1)

## 2020-08-12 MED ORDER — HYDROXYZINE HCL 25 MG PO TABS: 25.0000 mg | ORAL_TABLET | Freq: Three times a day (TID) | ORAL | Status: DC | PRN

## 2020-08-12 NOTE — IPOC Note (Signed)
Overall Plan of Care [facility]) Patient Details Name: Sheila Andrade MRN: [id number] DOB: 11-25-1935  Admitting Diagnosis: Debility  Hospital Problems: Principal Problem:   Debility     Functional Problem List: Nursing Behavior, Medication Management, Endurance, Nutrition, Safety  PT Balance, Endurance, Motor, Safety  OT Balance, Cognition, Endurance, Motor, Safety, Vision  SLP Cognition  TR         Basic ADL's: OT Grooming, Bathing, Dressing, Toileting     Advanced  ADL's: OT Simple Meal Preparation     Transfers: PT Bed Mobility, Bed to Chair, Car  OT Toilet, Tub/Shower     Locomotion: PT Ambulation, Stairs, Wheelchair Mobility     Additional Impairments: OT Fuctional Use of Upper Extremity  SLP Social Cognition   Problem Solving, Memory, Attention  TR      Anticipated Outcomes Item Anticipated Outcome  Self Feeding no goal  Swallowing      Basic self-care  Min A  Toileting  Min A   Bathroom Transfers Min A  Bowel/Bladder  n/a  Transfers  c/g with LRAD  Locomotion  c/g with LRAD  Communication     Cognition  mod I  Pain  n/a  Safety/Judgment  min assist   Therapy Plan: PT Intensity: Minimum of 1-2 x/day ,45 to 90 minutes PT Frequency: 5 out of 7 days PT Duration Estimated Length of Stay: 14 to 16 days OT Intensity: Minimum of 1-2 x/day, 45 to 90 minutes OT Frequency: 5 out of 7 days OT Duration/Estimated Length of Stay: 14-16 days SLP Intensity: Minumum of 1-2 x/day, 30 to 90 minutes SLP Frequency: 3 to 5 out of 7 days SLP Duration/Estimated Length of Stay: 14-16 days   Due to the current state of emergency, patients may not be receiving their 3-hours of Medicare-mandated therapy.   Team Interventions: Nursing Interventions Patient/Family Education, Disease Management/Prevention, Medication Management, Discharge Planning  PT interventions Ambulation/gait training, Balance/vestibular training, Discharge planning, DME/adaptive equipment  instruction, Functional mobility training, Neuromuscular re-education, Patient/family education, Stair training, Therapeutic Activities, UE/LE Strength taining/ROM, UE/LE Coordination activities, Therapeutic Exercise, Visual/perceptual remediation/compensation, Wheelchair propulsion/positioning  OT Interventions Balance/vestibular training, Discharge planning, Pain management, Self Care/advanced ADL retraining, Therapeutic Activities, UE/LE Coordination activities, Cognitive remediation/compensation, Disease mangement/prevention, Functional mobility training, Patient/family education, Skin care/wound managment, Therapeutic Exercise, Visual/perceptual remediation/compensation, DME/adaptive equipment instruction, Neuromuscular re-education, Psychosocial support, UE/LE Strength taining/ROM, Wheelchair propulsion/positioning  SLP Interventions Cognitive remediation/compensation, Internal/external aids, Cueing hierarchy, Functional tasks, Medication managment, Patient/family education  TR Interventions    SW/CM Interventions Discharge Planning, Psychosocial Support, Patient/Family Education   Barriers to Discharge MD  Medical stability, Home enviroment access/loayout, Weight, Medication compliance, and Behavior  Nursing Decreased caregiver support, Home environment access/layout, Lack of/limited family support, Weight, Medication compliance, Behavior, Nutrition means    PT Inaccessible home environment, Home environment access/layout    OT Incontinence, Lack of/limited family support    SLP      SW       Team Discharge Planning: Destination: PT-Home ,OT- Home , SLP-Home Projected Follow-up: PT-Home health PT, OT-  24 hour supervision/assistance, Home health OT, SLP-Other (comment) (TBD) Projected Equipment Needs: PT-To be determined, OT- To be determined, SLP-None recommended by SLP Equipment Details: PT- , OT-  Patient/family involved in discharge planning: PT- Patient,  OT-Patient,  SLP-Patient  MD ELOS: 14-16 days Medical Rehab Prognosis:  Fair Assessment: Pt is an 85 yr old female with hx of depression and failure to thrive as well as fall at home with gait instability after fall- 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   Goals- min A by d/c   See Team Conference Notes for weekly updates to the plan of care

## 2020-08-12 NOTE — Progress Notes (Signed)
Patient ID: Sheila Andrade, female   DOB: August 03, 1935, 85 y.o.   MRN: [id number]  Inpatient Rehabilitation Care Coordinator Assessment and Plan Patient Details  Name: Sheila Andrade MRN: [id number] Date of Birth: November 23, 1935   Today's Date: 08/05/2020   Hospital Problems: Active Problems:   Debility   Pressure injury of skin   Past Medical History:      Past Medical History:  Diagnosis Date   Hot flashes 06/06/2020   Hypertension      Past Surgical History: History reviewed. No pertinent surgical history. Social History:  reports that she has never smoked. She has never used smokeless tobacco. She reports that she does not use drugs. No history on file for alcohol use.   Family / Support Systems Marital Status: Divorced Patient Roles: Parent Children: Mary-daughter [phone]-cell  Liz-daughter [phone]-cell Other Supports: [name]-daughter in-law [phone]-cell Anticipated Caregiver: Family Ability/Limitations of Caregiver: Children and family members work so will need to come up with a plan for DC Caregiver Availability: Other (Comment) (Will need to come up with a plan for 24/7 care) Family Dynamics: Close knit family who have been providing care since discharging from rehab in 03/2020. Pt has had other health issues and now is here. Family is very supportive and involved and will assist with her care   Social History Preferred language: English Religion: Christian Cultural Background: No issues Education: PhD in education Read: Yes Write: Yes Employment Status: Retired Marine scientist Issues: NO issues Guardian/Conservator: None-according to MD pt a times is capable of making her own decisions, will include children if any decisions need tpo be made while here    Abuse/Neglect Abuse/Neglect Assessment Can Be Completed: Yes Physical Abuse: Denies Verbal Abuse: Denies Sexual Abuse: Denies Exploitation of patient/patient's resources: Denies Self-Neglect: Denies    Emotional Status Pt's affect, behavior and adjustment status: Pt is tired from am therapies, but did answer my questions. She wants to be able to take care of herself, sh eis not one to rley upon others and does not like it. She hopes to get back to her own home but will settle to go to daughter's then hopefully back to her home. Recent Psychosocial Issues: other health issues-started in 1/202 pelvic fracture and then COVID. Psychiatric History: Currently depressed due to health issues, lose of control and independence. Has had psych eval upon admission and currently following while here. Will need OP follow up for this. Medications being changed and hopefully will manage her depression better Substance Abuse History: No issues   Patient / Family Perceptions, Expectations & Goals Pt/Family understanding of illness & functional limitations: Pt and daughter can explain her health issues and the tme line, both hope pt does well here and can be mobile again. Both speak with the MD and feel they have a good understanding of her treatment plan going forward. Premorbid pt/family roles/activities: Mom, grandmother, retiree, home owner, church member, etc Anticipated changes in roles/activities/participation: resume Pt/family expectations/goals: Pt states: " I want to be able to care for myself, we will see though."  Daughter states: " We hope she can improve while here and get better."   [employer]: Other (Comment) (was in rehab after pelvic fracture) Premorbid Home Care/DME Agencies: Other (Comment) [facility] followed after reahb stay) Transportation available at discharge: Family members Resource referrals recommended: Other (Comment) (psychiatry following)   Discharge Planning Living Arrangements: Children, Other relatives Support Systems: Children, Other relatives, Friends/neighbors, Church/faith community Type of Residence: Private residence Insurance Resources: [employer] (specify) (  [employer] medicare) Financial Resources: Restaurant manager, fast food Screen Referred: No Living Expenses: Lives with family Money Management: Family Does the patient have any problems obtaining your medications?: No Home Management: Family members since 03/2020 Patient/Family Preliminary Plans: Return back to daughter's home, will make aware pt will probably need 24/7 care at discharge from rehab and due to coming here she is not able to go to a SNF facility with her managed care insurance. Care Coordinator Barriers to Discharge: Insurance for SNF coverage Care Coordinator Anticipated Follow Up Needs: HH/OP, Other (comment)   Clinical Impression Pleasant yet tired female who is willing to work in therapies to regain her independence. She has had multiple health issues since 01/2020 and this has caused her to be depressed and questioning her will to live. She has been staying with daughter's since discharging from rehab in 03/2020 and has continued to have health issues which has derailed her recover from her pelvic fracture. She would like to get back to her home in [place] but is willing to go to daughter's home from here. She will need OP psych follow up and home health. Her family is very involved and will need to come up with a 24/7 plan for her. Will work on discharge needs and provide support to pt.   Sheila Andrade 08/05/2020, 12:01 PM               Note Details  Author Sindy Messing File Time 08/05/2020 12:06 PM  Author Type Social Worker Status Signed  Last Editor Makailey Hodgkin, [facility]  Hospital Acct # [id number] Admit Date 08/09/2020

## 2020-08-12 NOTE — Progress Notes (Signed)
Inpatient Rehabilitation Center Individual Statement of Services  Patient Name:  Sheila Andrade  Date:  08/12/2020  Welcome to the Inpatient Rehabilitation Center.  Our goal is to provide you with an individualized program based on your diagnosis and situation, designed to meet your specific needs.  With this comprehensive rehabilitation program, you will be expected to participate in at least 3 hours of rehabilitation therapies Monday-Friday, with modified therapy programming on the weekends.  Your rehabilitation program will include the following services:  Physical Therapy (PT), Occupational Therapy (OT), Speech Therapy (ST), 24 hour per day rehabilitation nursing, Neuropsychology, Care Coordinator, Rehabilitation Medicine, Nutrition Services, and Pharmacy Services  Weekly team conferences will be held on Tuesday to discuss your progress.  Your Inpatient Rehabilitation Care Coordinator will talk with you frequently to get your input and to update you on team discussions.  Team conferences with you and your family in attendance may also be held.  Expected length of stay: 14-16 days  Overall anticipated outcome: CGA-min level  Depending on your progress and recovery, your program may change. Your Inpatient Rehabilitation Care Coordinator will coordinate services and will keep you informed of any changes. Your Inpatient Rehabilitation Care Coordinator's name and contact numbers are listed  below.  The following services may also be recommended but are not provided by the Inpatient Rehabilitation Center:  Driving Evaluations Home Health Rehabiltiation Services Outpatient Rehabilitation Services    Arrangements will be made to provide these services after discharge if needed.  Arrangements include referral to agencies that provide these services.  Your insurance has been verified to be:  [employer] Your primary doctor is:  Avannah Decker  Pertinent information will be shared with your doctor  and your insurance company.  Inpatient Rehabilitation Care Coordinator:  Dossie Der, [place] [phone] or Luna Glasgow  Information discussed with and copy given to patient by: Lucy Chris, 08/12/2020, 10:28 AM

## 2020-08-12 NOTE — Progress Notes (Signed)
Pt reports that she has a lot of anxiety related to bladder scans especially at night. Pt states that she has not had to be cathed in a long time and would like to not be cathed and would like bladder scans be discontinued. Spoke to pt's primary nurse and will pass on concern to day shift staff. Will not bladder scan during this night per pt's request. Pt concerned that she has had increased weakness in legs. Daughter Jamina Macbeth reports that she has been unable to speak with psychiatry because she is very busy working and a call back number has not been left for her to return call, and daughter is very concerned about pt's depression.

## 2020-08-12 NOTE — Progress Notes (Addendum)
PROGRESS NOTE   Subjective/Complaints:  Pt reports LBM was 2 days ago- said is normal for her- doesn't feel constipated.  Reports ate >50% of breakfast.  Denies any pain.   Per family, they want her reseen by psychiatry because "she's lost that light in her eyes". I have spoken to Psychiatry 3x today- they went to go see her and attempted to call daughter- were unable to get ahold of her- and asked me to relay their medical decisions.    ROS:  Pt denies SOB, abd pain, CP, N/V/C/D, and vision changes   Objective:   No results found. Recent Labs    08/12/20 0552  WBC 7.9  HGB 10.8*  HCT 34.6*  PLT 307   Recent Labs    08/12/20 0552  NA 137  K 4.0  CL 109  CO2 21*  GLUCOSE 92  BUN 27*  CREATININE 0.77  CALCIUM 9.1    Intake/Output Summary (Last 24 hours) at 08/12/2020 1912 Last data filed at 08/12/2020 1300 Gross per 24 hour  Intake 480 ml  Output 275 ml  Net 205 ml     Pressure Injury 08/02/20 Buttocks Left Stage 1 -  Intact skin with non-blanchable redness of a localized area usually over a bony prominence. (Active)  08/02/20 1845  Location: Buttocks  Location Orientation: Left  Staging: Stage 1 -  Intact skin with non-blanchable redness of a localized area usually over a bony prominence.  Wound Description (Comments):   Present on Admission: Yes    Physical Exam: Vital Signs Blood pressure 113/66, pulse 63, temperature 98.2 F (36.8 C), temperature source Oral, resp. rate 18, height _0  (1.702 m), weight 55.1 kg, SpO2 98 %.   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 Normal insight and awareness. Intact Memory. Normal language and speech. Cranial nerve exam unremarkable. UE 4+/5. LE 3-4/5 prox to distal. No sensory findings. Mild tremor.  Musculoskeletal: normal ROM    Assessment/Plan: 1. Functional deficits which require 3+ hours per day of interdisciplinary therapy in a comprehensive inpatient rehab setting. Physiatrist is providing close team supervision and 24 hour management of active medical problems listed below. Physiatrist and rehab team continue to assess barriers to discharge/monitor patient progress toward functional and medical goals  Care Tool:  Bathing    Body parts bathed by patient: Right arm, Left arm   Body parts bathed by helper: Buttocks     Bathing assist Assist Level: Moderate Assistance - Patient 50 - 74%     Upper Body Dressing/Undressing Upper body dressing   What is the patient wearing?: Button up shirt    Upper body assist Assist Level: Supervision/Verbal cueing    Lower Body Dressing/Undressing Lower body dressing  What is the patient wearing?: Incontinence brief, Pants     Lower body assist Assist for lower body dressing: 2 Helpers     Toileting Toileting    Toileting assist Assist for toileting: Minimal Assistance - Patient > 75%     Transfers Chair/bed transfer  Transfers assist     Chair/bed transfer assist level: Moderate Assistance - Patient 50 - 74%     Locomotion Ambulation   Ambulation assist   Ambulation activity did not occur: Safety/medical concerns  Assist level: Moderate Assistance - Patient 50 - 74% Assistive device: Walker-rolling Max distance: 40'   Walk 10 feet activity   Assist  Walk 10 feet activity did not occur: Safety/medical concerns  Assist level: Moderate Assistance - Patient - 50 - 74% Assistive device:  Walker-rolling   Walk 50 feet activity   Assist Walk 50 feet with 2 turns activity did not occur: Safety/medical concerns  Assist level: Moderate Assistance - Patient - 50 - 74% Assistive device: Walker-rolling    Walk 150 feet activity   Assist Walk 150 feet activity did not occur: Safety/medical concerns         Walk 10 feet on uneven surface  activity   Assist Walk 10 feet on uneven surfaces activity did not occur: Safety/medical concerns         Wheelchair     Assist   Type of Wheelchair: Manual    Wheelchair assist level: Total Assistance - Patient < 25% Max wheelchair distance: 50    Wheelchair 50 feet with 2 turns activity    Assist        Assist Level: Total Assistance - Patient < 25%   Wheelchair 150 feet activity     Assist      Assist Level: Total Assistance - Patient < 25%   Blood pressure 113/66, pulse 63, temperature 98.2 F (36.8 C), temperature source Oral, resp. rate 18, height _0  (1.702 m), weight 55.1 kg, SpO2 98 %.  Medical Problem List and Plan: 1.  Functional deficits secondary to debility, gait disorder and failure to thrive             -patient may shower             -ELOS/Goals: 10-14 days, supervision to min assist with PT, OT and supervision with cognition  -Patient is beginning CIR therapies today  including PT, ST, and OT -con'r CIR_ PT, OT and SLP- IPOC today  2.  Antithrombotics: -DVT/anticoagulation:  Pharmaceutical: Lovenox             -antiplatelet therapy: N/A 3. Pain Management: Tylenol prn.  4. Mood: Team to provide ego support. LCSW to follow for evaluation and support.              -antipsychotic agents: N/A 5. Neuropsych: This patient is capable of making decisions on her own behalf. 6. Skin/Wound Care: Routine pressure relief measures.  7. Fluids/Electrolytes/Protein deficient malnutrition: Monitor I/O. --Continue Ensure TID to help with malnutrition due to FTT. -intake much improved,  sometimes a little sporadic -continue megace which has helped  8. SVT: HR with metoprolol 12.5 mg TID             --patient/family educated on  use of Vagal maneuvers to reset to NSR. 9. Severe recurrent depression: Stable on prozac 10 mg daily.   8/8- per psychiatry, pt had memory issues with higher dose- added Vistaril for anxiety Sx's.  11. Vitamin B 12 deficiency: On  oral supplement. 12.  Frequency/Incontinence: Improving?.              -monitor voiding patterns 13. HTN: Monitor BP tid--controlled on Metoprolol.  14. Constipation: had a large bm 8/6  -senokot-s at bedtime with daily miralax for now   8/8- LBM 8/6- if no BM by tomorrow, will intervene- is "normal" per pt.   I spent a total of 37 minutes on total care today- more than 50% on coordination of care with d/w psychiatry and PA regarding family.    LOS: 3 days A FACE TO FACE EVALUATION WAS PERFORMED  Lonisha Bobby 08/12/2020, 7:12 PM

## 2020-08-12 NOTE — Progress Notes (Signed)
Occupational Therapy Session Note  Patient Details  Name: Sheila Andrade MRN: [id number] Date of Birth: 1935-07-31  Today's Date: 08/12/2020 OT Individual Time: 1100-1155 OT Individual Time Calculation (min): 55 min    Short Term Goals: Week 1:  OT Short Term Goal 1 (Week 1): Pt will complete 1/3 components of toileting with CGA for balance OT Short Term Goal 2 (Week 1): Pt will complete 2 grooming tasks while standing in front of the sink to increase standing endurance OT Short Term Goal 3 (Week 1): Pt will complete LB dressing with Mod A using adaptive techniques/AE as needed  Skilled Therapeutic Interventions/Progress Updates:    Pt greeted at time of session semireclined in bed resting with daughter in law present who did not remain for session, DIL having several questions regarding mood stabilizer Prozac and has already relayed to RN. Discussion POC and pt progress thus far. Supine > sit Min A and stand pivot bed > wheelchair Min/Mod overall. Set up wheelchair level at sink for oral hygiene and face washing Set up. Pt transported to gym dependent for time and performed several standing tasks at high low table 2 rounds of PVC pipe tree in standing for a total of 3-4 minutes. Dynamic standing as well for towel pushes on table surface 1x10 for anterior weight shift and decreasing posterior lean. Back in room, stand pivot wheelchair <> toilet Min A and Mod A to stand from low surface, placed BSC over top later. Set up in chair alarm on call bell in reach.    Therapy Documentation Precautions:  Precautions Precautions: Fall Precaution Comments: Posterior lean in standing Restrictions Weight Bearing Restrictions: No    Therapy/Group: Individual Therapy  Erasmo Score 08/12/2020, 7:23 AM

## 2020-08-12 NOTE — Progress Notes (Signed)
Patient voided 150 ml of urine and PVR bladder scan 169 ml.

## 2020-08-12 NOTE — Plan of Care (Signed)
Psychiatry Plan of care   Received message from primary team regarding Pt's daughter's concern about Pt's depression and psych medications. Called daughter Taquita Demby @ [phone] multiple times but no answer.  Talked to Pt, about her concerns regarding medications.  Patient states she feels anxious and agitated sometimes.  She states that her daughter wants to increase her medication Prozac to 20 mg daily.  Patient denied SI, HI, AVH.  Contracts for safety at this time.  Discussed that Prozac was reduced to 10 mg daily in the past due to memory issues.  Patient states she was getting 2 different antidepressants at that time and may be that was the reason for memory problem. Discussed that it may be due to Prozac high-dose. Also noticed that patient is not on hydroxyzine.  During last consult it was recommended that patient can utilize 25 mg hydroxyzine as needed for anxiety and agitation. Plan -Recommend continuing Prozac 10 mg daily for depression. -Restart hydroxyzine 25 mg 3 times daily as needed for anxiety and agitation. -Continue trazodone 25 mg nightly to help with sleep. Please reach out to [place] if you have any more questions.   Dr Karsten Ro MD PGY2 Resident [facility] Psychiatry

## 2020-08-12 NOTE — Progress Notes (Signed)
Physical Therapy Session Note  Patient Details  Name: Sheila Andrade MRN: [id number] Date of Birth: Feb 04, 1935  Today's Date: 08/12/2020 PT Individual Time: 0800-0900 PT Individual Time Calculation (min): 60 min   Short Term Goals: Week 1:  PT Short Term Goal 1 (Week 1): Pt with increase bed mobility to S. PT Short Term Goal 2 (Week 1): Pt wil increase transfers to min A. PT Short Term Goal 3 (Week 1): Pt will ambulate with LRAD about 100 feet with min A. PT Short Term Goal 4 (Week 1): Pt will ascend/descend 4 stairs with B rails and min A. PT Short Term Goal 5 (Week 1): Pt will propel w/c about 50 feet with S.  Skilled Therapeutic Interventions/Progress Updates:    pt received in bed and agreeable to therapy. No complaint of pain. Bed mobility with supervision, pt donned shoes with set up. ambulatory transfer with min A and RW for safety cues. Pt transported to gym for time mangmt. Pt eprformed Sit to stand with RW and mod A progressing to min-CGA at times with VC for anterior weight shift and upright posture. Pt performed pre gait activities with RW and CGA including step taps. Step over 1/2" obstacle heel strike on target, tandem stance weight shifts. Pt ambulated with RW 3 x 30 ft. Attempted pregait activity with hallway rail, pt expressed intense fear and was unable to continue. Pt returned to room and performed Stand pivot transfer to bed with RW and CGA. Pt was left with all needs in reach and alarm active.   Therapy Documentation Precautions:  Precautions Precautions: Fall Precaution Comments: Posterior lean in standing Restrictions Weight Bearing Restrictions: No     Therapy/Group: Individual Therapy  Juluis Rainier 08/12/2020, 11:20 AM

## 2020-08-13 DIAGNOSIS — Z515 Encounter for palliative care: Secondary | ICD-10-CM

## 2020-08-13 DIAGNOSIS — R5381 Other malaise: Secondary | ICD-10-CM | POA: Diagnosis not present

## 2020-08-13 MED ORDER — FLUOXETINE HCL 10 MG PO CAPS
10.0000 mg | ORAL_CAPSULE | Freq: Once | ORAL | Status: DC
  Filled 2020-08-13 (×3): qty 1

## 2020-08-13 MED ORDER — BUSPIRONE HCL 5 MG PO TABS
5.0000 mg | ORAL_TABLET | Freq: Three times a day (TID) | ORAL | Status: DC
  Administered 2020-08-13 – 2020-08-23 (×31): 5 mg via ORAL
  Filled 2020-08-13 (×31): qty 1

## 2020-08-13 MED ORDER — FLUOXETINE HCL 20 MG PO CAPS
20.0000 mg | ORAL_CAPSULE | Freq: Every day | ORAL | Status: DC
  Administered 2020-08-13 – 2020-08-18 (×6): 20 mg via ORAL
  Filled 2020-08-13 (×7): qty 1

## 2020-08-13 NOTE — Progress Notes (Signed)
PROGRESS NOTE   Subjective/Complaints:   Spoke to pt's daughter- GI as well as pt this AM.  Pt reports leg weakness, however per therapy, going /progressing really well.  Pt reports slept OK and eating better.  Daughter also concerned about Parkinsonism- wants Sinemet tried.  Will first try increasing Prozac to 20 mg daily and adding Buspar 5 mg TID for anxiety.   ROS:   Pt denies SOB, abd pain, CP, N/V/C/D, and vision changes    Objective:   No results found. Recent Labs    08/12/20 0552  WBC 7.9  HGB 10.8*  HCT 34.6*  PLT 307   Recent Labs    08/12/20 0552  NA 137  K 4.0  CL 109  CO2 21*  GLUCOSE 92  BUN 27*  CREATININE 0.77  CALCIUM 9.1    Intake/Output Summary (Last 24 hours) at 08/13/2020 1954 Last data filed at 08/13/2020 1700 Gross per 24 hour  Intake 460 ml  Output 300 ml  Net 160 ml     Pressure Injury 08/02/20 Buttocks Left Stage 1 -  Intact skin with non-blanchable redness of a localized area usually over a bony prominence. (Active)  08/02/20 1845  Location: Buttocks  Location Orientation: Left  Staging: Stage 1 -  Intact skin with non-blanchable redness of a localized area usually over a bony prominence.  Wound Description (Comments):   Present on Admission: Yes    Physical Exam: Vital Signs Blood pressure 120/79, pulse 67, temperature 98 F (36.7 C), temperature source Oral, resp. rate 16, height 5\' 7"  (1.702 m), weight 55.1 kg, SpO2 100 %.   General: awake, alert, appropriate, profoundly flat; but working with SLP in room; NAD HENT: conjugate gaze; oropharynx moist; L eye injected from her glaucoma CV: regular rate; no JVD Pulmonary: CTA B/L; no W/R/R- good air movement GI: soft, NT, ND, (+)BS Psychiatric: appropriate; profoundly flat; but interactive when asked Neurological: alert  Ext: no clubbing, cyanosis, or edema Skin: pale, warm, left buttock wound Neuro:  Alert and  oriented x 3. Normal insight and awareness. Intact Memory. Normal language and speech. Cranial nerve exam unremarkable. UE 4+/5. LE 3-4/5 prox to distal. No sensory findings. Mild tremor.  Musculoskeletal: normal ROM    Assessment/Plan: 1. Functional deficits which require 3+ hours per day of interdisciplinary therapy in a comprehensive inpatient rehab setting. Physiatrist is providing close team supervision and 24 hour management of active medical problems listed below. Physiatrist and rehab team continue to assess barriers to discharge/monitor patient progress toward functional and medical goals  Care Tool:  Bathing    Body parts bathed by patient: Right arm, Left arm   Body parts bathed by helper: Buttocks     Bathing assist Assist Level: Moderate Assistance - Patient 50 - 74%     Upper Body Dressing/Undressing Upper body dressing   What is the patient wearing?: Button up shirt    Upper body assist Assist Level: Supervision/Verbal cueing    Lower Body Dressing/Undressing Lower body dressing      What is the patient wearing?: Incontinence brief, Pants     Lower body assist Assist for lower body dressing: 2 Helpers  Toileting Toileting    Toileting assist Assist for toileting: Minimal Assistance - Patient > 75%     Transfers Chair/bed transfer  Transfers assist     Chair/bed transfer assist level: Minimal Assistance - Patient > 75% Chair/bed transfer assistive device: Walker, Archivist   Ambulation assist   Ambulation activity did not occur: Safety/medical concerns  Assist level: Minimal Assistance - Patient > 75% Assistive device: Walker-rolling Max distance: 43ft   Walk 10 feet activity   Assist  Walk 10 feet activity did not occur: Safety/medical concerns  Assist level: Minimal Assistance - Patient > 75% Assistive device: Walker-rolling   Walk 50 feet activity   Assist Walk 50 feet with 2 turns activity did not  occur: Safety/medical concerns  Assist level: (P) Moderate Assistance - Patient - 50 - 74% Assistive device: (P) Walker-rolling    Walk 150 feet activity   Assist Walk 150 feet activity did not occur: (P) Safety/medical concerns         Walk 10 feet on uneven surface  activity   Assist Walk 10 feet on uneven surfaces activity did not occur: (P) Safety/medical concerns         Wheelchair     Assist Will patient use wheelchair at discharge?:  (yes per PT LTG) Type of Wheelchair: (P) Manual    Wheelchair assist level: (P) Total Assistance - Patient < 25% Max wheelchair distance: 50    Wheelchair 50 feet with 2 turns activity    Assist        Assist Level: (P) Total Assistance - Patient < 25%   Wheelchair 150 feet activity     Assist      Assist Level: (P) Total Assistance - Patient < 25%   Blood pressure 120/79, pulse 67, temperature 98 F (36.7 C), temperature source Oral, resp. rate 16, height 5\' 7"  (1.702 m), weight 55.1 kg, SpO2 100 %.  Medical Problem List and Plan: 1.  Functional deficits secondary to debility, gait disorder and failure to thrive             -patient may shower             -ELOS/Goals: 10-14 days, supervision to min assist with PT, OT and supervision with cognition  -Patient is beginning CIR therapies today  including PT, ST, and OT -con't PT, OT and SLP/CIR- team conference- set d/c date for late next week.  2.  Antithrombotics: -DVT/anticoagulation:  Pharmaceutical: Lovenox             -antiplatelet therapy: N/A 3. Pain Management: Tylenol prn.  4. Mood: Team to provide ego support. LCSW to follow for evaluation and support.              -antipsychotic agents: N/A 5. Neuropsych: This patient is capable of making decisions on her own behalf. 6. Skin/Wound Care: Routine pressure relief measures.  7. Fluids/Electrolytes/Protein deficient malnutrition: Monitor I/O. --Continue Ensure TID to help with malnutrition due to  FTT. -intake much improved, sometimes a little sporadic -continue megace which has helped  8/9- eating better- will con't to monitor 8. SVT: HR with metoprolol 12.5 mg TID             --patient/family educated on  use of Vagal maneuvers to reset to NSR. 9. Severe recurrent depression: Stable on prozac 10 mg daily.   8/8- per psychiatry, pt had memory issues with higher dose- added Vistaril for anxiety Sx's.   8/9- family insistent no memory  issues due ot Prozac- was due to other meds- will try increasing Prozac to 20 mg daily and for anxiety trying Buspar 5 mg TID not Vistaril- 11. Vitamin B 12 deficiency: On  oral supplement. 12. Frequency/Incontinence: Improving?.              -monitor voiding patterns 13. HTN: Monitor BP tid--controlled on Metoprolol.  14. Constipation: had a large bm 8/6  -senokot-s at bedtime with daily miralax for now   8/8- LBM 8/6- if no BM by tomorrow, will intervene- is "normal" per pt.  15. Shuffling gait? Parkinsonism?  8/9 - spoke with Neuro - no consult- they agreed that a low dose trial of Sinemet low dose TID could be worth trying- will try tomorrow if no side effects from other meds.    I spent a total of 46 minutes on total care- >50% on coordination of care- speaking with daughter as well as Neuro and PA and palliative care.   LOS: 4 days A FACE TO FACE EVALUATION WAS PERFORMED  Sheila Andrade 08/13/2020, 7:54 PM

## 2020-08-13 NOTE — Progress Notes (Signed)
Physical Therapy Session Note  Patient Details  Name: Sheila Andrade MRN: [id number] Date of Birth: 1935-08-29  Today's Date: 08/13/2020 PT Individual Time: 1005-1043 PT Individual Time Calculation (min): 38 min   Short Term Goals: Week 1:  PT Short Term Goal 1 (Week 1): Pt with increase bed mobility to S. PT Short Term Goal 2 (Week 1): Pt wil increase transfers to min A. PT Short Term Goal 3 (Week 1): Pt will ambulate with LRAD about 100 feet with min A. PT Short Term Goal 4 (Week 1): Pt will ascend/descend 4 stairs with B rails and min A. PT Short Term Goal 5 (Week 1): Pt will propel w/c about 50 feet with S.  Skilled Therapeutic Interventions/Progress Updates:    Pt received supine in bed and agreeable to therapy session. Supine>sitting R EOB, HOB partially elevated but not using bedrails with light min assist to manage R LE over to EOB (notice poor motor planning and small movements). Requires significantly increased time and effort with many, many small scoots to move hips forward to EOB in sitting. Sitting EOB, doffed dirty shirt and donned clean shirt with set-up assist. Sit>stand elevated EOB with mod assist and requiring 5x attempts prior to coming to stand due to pt starting to lean trunk posteriorly once hips were coming up from bed - requires max cuing to maintain anterior trunk lean while hips were lifting prior to initiating trunk/hip extension to come upright to full stand. Standing using RW with light min assist able to pull pants down from hips. In sitting,finished doffing dirty pants and donned clean pants with cuing for technique. Donned shoes total assist for time management. Pt reports need to use bathroom. Sit>stand as described earlier; however, when given pt increased time to initiate lifting hips up during transfer requires decreased assist. Gait training ~67ft into bathroom using RW with light min assist for balance - cuing for increased hip/trunk extension for upright posture  as pt stays flexed forward at the hips - cuing for increased step lengths as pt has a slight shuffle pattern initially. LB clothing management as described above. Continent of bladder - standing with UE support on grab bar and supervision for balance safety during total assist peri-care for cleanliness. Gait training ~82ft to w/c using RW with light min assist and continued cuing as above. At end of session, pt left seated in w/c with needs in reach and seat belt alarm on.  Therapy Documentation Precautions:  Precautions Precautions: Fall Precaution Comments: Posterior lean in standing Restrictions Weight Bearing Restrictions: No   Pain:  No reports of pain throughout session.   Therapy/Group: Individual Therapy  Ginny Forth , PT, DPT, NCS, CSRS 08/13/2020, 7:58 AM

## 2020-08-13 NOTE — Progress Notes (Signed)
Occupational Therapy Session Note  Patient Details  Name: Sheila Andrade MRN: [id number] Date of Birth: [birth year]  Today's Date: 08/13/2020 OT Individual Time: 1300-1410 and 1173-5670 OT Individual Time Calculation (min): 70 min and 27 min   Short Term Goals: Week 1:  OT Short Term Goal 1 (Week 1): Pt will complete 1/3 components of toileting with CGA for balance OT Short Term Goal 2 (Week 1): Pt will complete 2 grooming tasks while standing in front of the sink to increase standing endurance OT Short Term Goal 3 (Week 1): Pt will complete LB dressing with Mod A using adaptive techniques/AE as needed  Skilled Therapeutic Interventions/Progress Updates:    Session 1: Pt greeted at time of session sitting up in wheelchair no pain, ready for OT session. Declined ADL, did not need to toilet. Pt wanting to focus on standing balance and functional mobility, transported to hall and ambulated 2x50 feet with CGA and RW cues for improved step through pattern and increasing stride length d/t short shuffling gate. In gym, Nustep on level 4 for a total of 8 minutes with 1 rest break for global endurance and preparatory activity for further functional mobility. TTB and shower seat transfer in tub with pt preferring TTB and this is safer more manageable DME to use for her tub/shower at home. Pt in agreement. Performed TTB with CGA and shower seat Min/Mod for step over. 1 set of 10 and 2nd set of 5 ball throws with large therapy ball overhead with focus on big movements and reaction speed to increase overall body awareness and reaction speed with activity. Walked door > bed CGA with RW and sit > supine Supervision. Alarm on call bell in reach.   Session 2: Pt greeted at time of session sitting EOB stating she had tried to put on shoes but was unable to reach, educated to wait for therapy to not reach too far. No pain. Needing to toilet, sit > stand CGA and extended time to do so with cues for scooting FWD and  head/toes relationship. Ambulated bed > bathroom CGA/Min with RW and cues for increasing step length and RW proximity. Clothing management CGA but did need help with posterior hygiene d/t fear of falling. Ambulated toilet > sink for hygiene CGA > bed same manner. Pt requesting to practice bed <> BSC transfers and did so with CGA with RW. Sit > supine Supervision with extended time and alarm on call bell in reach.   Therapy Documentation Precautions:  Precautions Precautions: Fall Precaution Comments: Posterior lean in standing Restrictions Weight Bearing Restrictions: No    Therapy/Group: Individual Therapy  Erasmo Score 08/13/2020, 7:21 AM

## 2020-08-13 NOTE — Plan of Care (Signed)
  Problem: Consults Goal: RH GENERAL PATIENT EDUCATION Description: See Patient Education module for education specifics. Outcome: Progressing   Problem: RH SAFETY Goal: RH STG ADHERE TO SAFETY PRECAUTIONS W/ASSISTANCE/DEVICE Description: STG Adhere to Safety Precautions With cues and reminders Outcome: Progressing   Problem: RH PAIN MANAGEMENT Goal: RH STG PAIN MANAGED AT OR BELOW PT'S PAIN GOAL Description: Pain level less than 4 on scale of 0-10 Outcome: Progressing   Problem: RH BOWEL ELIMINATION Goal: RH STG MANAGE BOWEL WITH ASSISTANCE Description: STG Manage Bowel with mod I Assistance. Outcome: Progressing   Problem: RH BLADDER ELIMINATION Goal: RH STG MANAGE BLADDER WITH ASSISTANCE Description: STG Manage Bladder With mod I Assistance Outcome: Progressing   

## 2020-08-13 NOTE — Progress Notes (Signed)
Patient ID: Sheila Andrade, female   DOB: 13-Dec-1935, 85 y.o.   MRN: [id number]  Met with pt and daughter in-law-[name] who was present in pt's room to discuss team conference goals of CGA-min level. Pt is aware she flucuates in her levels and at times is more care than others, dependent upon her balance and ability to follow cues. She is aware of her posterior lean and hopes this will get better while she is here and participating in therapies. [name] reports she will return to their home upon discharge. Both aware target discharge date is 8/19. Pt very appreciative and is in agreement with the target discharge date. Will work on discharge needs and follow along.

## 2020-08-13 NOTE — Progress Notes (Signed)
Speech Language Pathology Daily Session Note  Patient Details  Name: Kinjal Neitzke MRN: [id number] Date of Birth: 1935/04/23  Today's Date: 08/13/2020 SLP Individual Time: 1962-2297 SLP Individual Time Calculation (min): 44 min  Short Term Goals: Week 1: SLP Short Term Goal 1 (Week 1): Patient will demonstrate selective attention to functional tasks in mild-to-moderately distracting environment with supervision verbal cues for redirection SLP Short Term Goal 2 (Week 1): Patient will complete mildly complex problem solving tasks with min A verbal cues SLP Short Term Goal 3 (Week 1): Patient will recall new, daily information with sup A verbal cues with use of compensatory memory strategies SLP Short Term Goal 4 (Week 1): Patient will complete functional medication management tasks with 90% accuracy and sup A verbal cues  Skilled Therapeutic Interventions:Skilled ST services focused on cognitive skills. Pt demonstrated recall of written message from daughter pertaining to adjustments in medication mod I. MD entered room during tx and expressed communication with pt's daughter and adjustments to medication. Pt was able to recall which medications were consumed at baseline when presented with name. SLP facilitated mildly complex problem solving, error awareness, recall within task and selective attention in TIB pill organizer task. Pt initially made mistake on first 3 medication, however after taught error awareness strategies ( checking piror to closing doors for each day) pt was able to carryover strategies. In task pt required overall min A verbal cues fading to mod I for problem solving (verbal was mod I compared to functional) and error awareness and recall within task supervision A verbal cues. Pt completed task in 30 minute interval in mildly distracting environment mod I. Pt was left in room with call bell within reach and bed alarm set. SLP recommends to continue skilled services.     Pain Pain  Assessment Pain Scale: 0-10 Pain Score: 0-No pain  Therapy/Group: Individual Therapy  Tay Whitwell  [facility] 08/13/2020, 11:50 AM

## 2020-08-13 NOTE — Progress Notes (Signed)
Patient ID: Sheila Andrade, female   DOB: 05-07-1935, 85 y.o.   MRN: [id number]    This NP reviewed medical records and discussed case with Dr Birdie Riddle in consultation.    No official consult place.  Dr Birdie Riddle in consultation with Neurology an Psychiatry.  Will consult Palliative Medicine as needed throughout this admission.   No charge  Lorinda Creed  NP Palliative Medicine Team    # [phone]

## 2020-08-14 DIAGNOSIS — R5381 Other malaise: Secondary | ICD-10-CM | POA: Diagnosis not present

## 2020-08-14 MED ORDER — CARBIDOPA-LEVODOPA 10-100 MG PO TABS
1.0000 | ORAL_TABLET | Freq: Three times a day (TID) | ORAL | Status: DC
  Administered 2020-08-14 – 2020-08-18 (×15): 1 via ORAL
  Filled 2020-08-14 (×17): qty 1

## 2020-08-14 NOTE — Progress Notes (Signed)
Occupational Therapy Session Note  Patient Details  Name: Sheila Andrade MRN: [id number] Date of Birth: 02/07/1935  Today's Date: 08/14/2020 OT Individual Time: 3734-2876 and 8115-7262 OT Individual Time Calculation (min): 58 min and 10 min Missed 20 minutes d/t fatigue   Short Term Goals: Week 1:  OT Short Term Goal 1 (Week 1): Pt will complete 1/3 components of toileting with CGA for balance OT Short Term Goal 2 (Week 1): Pt will complete 2 grooming tasks while standing in front of the sink to increase standing endurance OT Short Term Goal 3 (Week 1): Pt will complete LB dressing with Mod A using adaptive techniques/AE as needed   Skilled Therapeutic Interventions/Progress Updates:    Session 1: Pt greeted at time of session sitting EOB ready for OT session, no pain. Retrieved wheelchair bag at beginning of session for transport. Note pt politely declined ADL today not wanting to bathe or change clothes. Pt performing functional mobility from bed > day room with CGA for functional mobility with RW but frequent verbal cues for increasing step length and upright posture. Sit <> stands throughout session CGA-Min A with cues for anterior weight shift and nose over toes. Bradykinesia evident throughout and extended time for all tasks. On EOM, pt performing several big movements with large therapy ball initially (too heavy and switched to small lightweight ball) for overhead raises and lateral leans 1x15, and forward rolls on large therapy ball for anterior weight shifting 2x15. Dynamic standing at [facility] for 2 rounds of the following: 2 mins circles only size 5 with 86% accuracy, second round copying 6 designs with good accuracy and minimal deviation all while standing with unilateral support. Back in room, ambulated door > bed CGA with RW and sit > supine Min A. Alarm on call bell in reach.  Session 2: Pt greeted at time of session semireclined in bed with PT finishing up session, hand off to OT. Pt  stating "I thought I had a break" and stating she is very fatigued. Offered pt ADL, OOB actiivty, in room activity and pt politely declining d/t fatigue. Pt starting to fall asleep during conversation, unable to participate at this time d/t fatigue. Missed 20 minutes of OT d/t fatigue.   Therapy Documentation Precautions:  Precautions Precautions: Fall Precaution Comments: Posterior lean in standing Restrictions Weight Bearing Restrictions: No     Therapy/Group: Individual Therapy  Erasmo Score 08/14/2020, 7:22 AM

## 2020-08-14 NOTE — Progress Notes (Signed)
PROGRESS NOTE   Subjective/Complaints:  Pt reports no side effects from Buspar or Prozac, but hasn't noticed a difference yet in anxiety or energy/mood levels (don't expect it yet).  Slept "pretty well".  Also noted she ate 75+% of breakfast base don tray at bedside.  LBM yesterday.   Discussed starting low dose Sinemet for her- she agreed as well as family.   ROS: Pt denies SOB, abd pain, CP, N/V/C/D, and vision changes    Objective:   No results found. Recent Labs    08/12/20 0552  WBC 7.9  HGB 10.8*  HCT 34.6*  PLT 307   Recent Labs    08/12/20 0552  NA 137  K 4.0  CL 109  CO2 21*  GLUCOSE 92  BUN 27*  CREATININE 0.77  CALCIUM 9.1    Intake/Output Summary (Last 24 hours) at 08/14/2020 0853 Last data filed at 08/13/2020 1700 Gross per 24 hour  Intake 340 ml  Output --  Net 340 ml     Pressure Injury 08/02/20 Buttocks Left Stage 1 -  Intact skin with non-blanchable redness of a localized area usually over a bony prominence. (Active)  08/02/20 1845  Location: Buttocks  Location Orientation: Left  Staging: Stage 1 -  Intact skin with non-blanchable redness of a localized area usually over a bony prominence.  Wound Description (Comments):   Present on Admission: Yes    Physical Exam: Vital Signs Blood pressure 136/71, pulse 63, temperature 98.2 F (36.8 C), temperature source Oral, resp. rate 16, height 5\' 7"  (1.702 m), weight 55.1 kg, SpO2 98 %.   General: awake, alert, appropriate, slightly more questions/giving information; but still flat; NAD HENT: conjugate gaze; oropharynx moist; L eye injected- from glaucoma;  CV: regular rate; no JVD Pulmonary: CTA B/L; no W/R/R- good air movement GI: soft, NT, ND, (+)BS Psychiatric: profoundly depressed but did ask/interact more this AM Neurological: alert Ext: no clubbing, cyanosis, or edema Skin: pale, warm, left buttock wound Neuro:  Alert and  oriented x 3. Normal insight and awareness. Intact Memory. Normal language and speech. Cranial nerve exam unremarkable. UE 4+/5. LE 3-4/5 prox to distal. No sensory findings. Mild tremor.  Musculoskeletal: normal ROM    Assessment/Plan: 1. Functional deficits which require 3+ hours per day of interdisciplinary therapy in a comprehensive inpatient rehab setting. Physiatrist is providing close team supervision and 24 hour management of active medical problems listed below. Physiatrist and rehab team continue to assess barriers to discharge/monitor patient progress toward functional and medical goals  Care Tool:  Bathing    Body parts bathed by patient: Right arm, Left arm   Body parts bathed by helper: Buttocks     Bathing assist Assist Level: Moderate Assistance - Patient 50 - 74%     Upper Body Dressing/Undressing Upper body dressing   What is the patient wearing?: Button up shirt    Upper body assist Assist Level: Supervision/Verbal cueing    Lower Body Dressing/Undressing Lower body dressing      What is the patient wearing?: Incontinence brief, Pants     Lower body assist Assist for lower body dressing: 2 Helpers     Toileting Toileting  Toileting assist Assist for toileting: Minimal Assistance - Patient > 75%     Transfers Chair/bed transfer  Transfers assist     Chair/bed transfer assist level: Minimal Assistance - Patient > 75% Chair/bed transfer assistive device: Walker, Armrests   Locomotion Ambulation   Ambulation assist   Ambulation activity did not occur: Safety/medical concerns  Assist level: Minimal Assistance - Patient > 75% Assistive device: Walker-rolling Max distance: 83ft   Walk 10 feet activity   Assist  Walk 10 feet activity did not occur: Safety/medical concerns  Assist level: Minimal Assistance - Patient > 75% Assistive device: Walker-rolling   Walk 50 feet activity   Assist Walk 50 feet with 2 turns activity did not  occur: Safety/medical concerns  Assist level: (P) Moderate Assistance - Patient - 50 - 74% Assistive device: (P) Walker-rolling    Walk 150 feet activity   Assist Walk 150 feet activity did not occur: (P) Safety/medical concerns         Walk 10 feet on uneven surface  activity   Assist Walk 10 feet on uneven surfaces activity did not occur: (P) Safety/medical concerns         Wheelchair     Assist Will patient use wheelchair at discharge?:  (yes per PT LTG) Type of Wheelchair: (P) Manual    Wheelchair assist level: (P) Total Assistance - Patient < 25% Max wheelchair distance: 50    Wheelchair 50 feet with 2 turns activity    Assist        Assist Level: (P) Total Assistance - Patient < 25%   Wheelchair 150 feet activity     Assist      Assist Level: (P) Total Assistance - Patient < 25%   Blood pressure 136/71, pulse 63, temperature 98.2 F (36.8 C), temperature source Oral, resp. rate 16, height 5\' 7"  (1.702 m), weight 55.1 kg, SpO2 98 %.  Medical Problem List and Plan: 1.  Functional deficits secondary to debility, gait disorder and failure to thrive             -patient may shower             -ELOS/Goals: 10-14 days, supervision to min assist with PT, OT and supervision with cognition  -Patient is beginning CIR therapies today  including PT, ST, and OT -con't CIR; SLP, PT and OT  2.  Antithrombotics: -DVT/anticoagulation:  Pharmaceutical: Lovenox             -antiplatelet therapy: N/A 3. Pain Management: Tylenol prn.  4. Mood: Team to provide ego support. LCSW to follow for evaluation and support.              -antipsychotic agents: N/A 5. Neuropsych: This patient is capable of making decisions on her own behalf. 6. Skin/Wound Care: Routine pressure relief measures.  7. Fluids/Electrolytes/Protein deficient malnutrition: Monitor I/O. --Continue Ensure TID to help with malnutrition due to FTT. -intake much improved, sometimes a little  sporadic -continue megace which has helped  8/9- eating better- will con't to monitor  8/10- ate 75% of breakfast this AM 8. SVT: HR with metoprolol 12.5 mg TID             --patient/family educated on  use of Vagal maneuvers to reset to NSR. 9. Severe recurrent depression: Stable on prozac 10 mg daily.   8/8- per psychiatry, pt had memory issues with higher dose- added Vistaril for anxiety Sx's.   8/9- family insistent no memory issues due ot Prozac- was  due to other meds- will try increasing Prozac to 20 mg daily and for anxiety trying Buspar 5 mg TID not Vistaril-  8/10- no side effects 11. Vitamin B 12 deficiency: On  oral supplement. 12. Frequency/Incontinence: Improving?.              -monitor voiding patterns 13. HTN: Monitor BP tid--controlled on Metoprolol.  14. Constipation: had a large bm 8/6  -senokot-s at bedtime with daily miralax for now   8/8- LBM 8/6- if no BM by tomorrow, will intervene- is "normal" per pt.  15. Shuffling gait? Parkinsonism?  8/9 - spoke with Neuro - no consult- they agreed that a low dose trial of Sinemet low dose TID could be worth trying- will try tomorrow if no side effects from other meds.   8/10- will start Low dose Sinemet- 10/100 mg TID   LOS: 5 days A FACE TO FACE EVALUATION WAS PERFORMED  Mirian Casco 08/14/2020, 8:53 AM

## 2020-08-14 NOTE — Patient Care Conference (Signed)
Inpatient RehabilitationTeam Conference and Plan of Care Update Date: 08/13/2020   Time: 11:43 AM    Patient Name: Sheila Andrade      Medical Record Number: [id number]  Date of Birth: 07-05-1935 Sex: Female         Room/Bed: 4W07C/4W07C-01 Payor Info: Payor: [employer] MEDICARE / Plan: [employer] MEDICARE CHOICE PPO / Product Type: *No Product type* /    Admit Date/Time:  08/09/2020  2:20 PM  Primary Diagnosis:  Debility  Hospital Problems: Principal Problem:   Debility    Expected Discharge Date: Expected Discharge Date: 08/23/20  Team Members Present: Physician leading conference: Dr. Genice Rouge Social Worker Present: Dossie Der, LCSW Nurse Present: Kennyth Arnold, RN PT Present: Casimiro Needle, PT OT Present: Earleen Newport, OT SLP Present: Colin Benton, SLP PPS Coordinator present : Fae Pippin, SLP     Current Status/Progress Goal Weekly Team Focus  Bowel/Bladder   q 6hr I&O cath, LBM 8/7  regain management of bowel/bladder  timed toilet q 2hr   Swallow/Nutrition/ Hydration             ADL's   Mod bathing, Max LB dress at sit <> stand, stand pivot transfers Mod A, posterior lean limiting, flat affect  Min toileting and LB ADLs, Min transfers  ADL transfers, standing balance, sit <> stands, anterior weight shifts, global endurance, big movements   Mobility   mod-min STS, short distance gait min with RW, limited by anxiety and parkinsonian movement patterns  CGA gait, supervision w/c mobility, mod stairs  functional mobility, balance, LE strength, gait pattern   Communication             Safety/Cognition/ Behavioral Observations  Min A  Mod I - Supervision A  Orientation, problem solving including medication and money management, short-term recall with use of strategies, selective attention   Pain   has prn pain medication  < 3  assess q 4 hr and prn   Skin   CDI  remain free of breakdown  assess skin q shift and prn     Discharge Planning:  Home with family  hiring assist, awaiting goals and target discharge date.   Team Discussion: Starting a low dose of Sinemet, started Buspar, increased Prozac. Has lack of movement, energy, used to be "Life of the party!" I&O cath q 6hr, continent bowel. Add timed toilet schedule, up to [facility] or toilet. Fluctuates, can be contact guard to min assist standing or mod assist. Flat affect. Min assist goals for self-care, toileting, transfers. Voided with PT, requires tons of cues. Has poor motor planning, short steps, and increased fear of falling. SLP mod I/supervision, working on medication management. Min assist for complex problem solving, error awareness.  Patient on target to meet rehab goals: yes  *See Care Plan and progress notes for long and short-term goals.   Revisions to Treatment Plan:  MD adjusting medications  Teaching Needs: Family education, medication management, pain management, transfer training, gait training, balance training, endurance training, safety awareness.  Current Barriers to Discharge: Decreased caregiver support, Medical stability, Home enviroment access/layout, Weight, Medication compliance, and Nutritional means  Possible Resolutions to Barriers: Continue current medications, provide emotional support.     Medical Summary Current Status: stopped bladder scans unless doesn't void- LBM 8/7; prn meds for pain; foam to sacrum for prevention; on Megace for appetite;  Barriers to Discharge: Behavior;Home enviroment access/layout;Medical stability;Weight  Barriers to Discharge Comments: home with family- to hire help- Possible Resolutions to [employer] Focus: focus- started [employer]  5 mg TID; increased Prozac to 20 mg daily- tomorrow- plan to add Sinemet TID per d/w Neuro; has posterior lean; very flat/bradykinesia; goals min A; poor motor planning- flexed trunk with gait; anxiety/fear of falling- limits therapy;  SLP goals Mod I;  d/c- 8/19 Friday next week   Continued Need for  Acute Rehabilitation Level of Care: The patient requires daily medical management by a physician with specialized training in physical medicine and rehabilitation for the following reasons: Direction of a multidisciplinary physical rehabilitation program to maximize functional independence : Yes Medical management of patient stability for increased activity during participation in an intensive rehabilitation regime.: Yes Analysis of laboratory values and/or radiology reports with any subsequent need for medication adjustment and/or medical intervention. : Yes   I attest that I was present, lead the team conference, and concur with the assessment and plan of the team.   Tennis Must 08/14/2020, 4:38 PM

## 2020-08-14 NOTE — Progress Notes (Signed)
Physical Therapy Session Note  Patient Details  Name: Sheila Andrade MRN: [id number] Date of Birth: [birth year]  Today's Date: 08/14/2020 PT Individual Time: 1135-1200, 1300-1345 PT Individual Time Calculation (min): 25 min, 45 min  Short Term Goals: Week 1:  PT Short Term Goal 1 (Week 1): Pt with increase bed mobility to S. PT Short Term Goal 2 (Week 1): Pt wil increase transfers to min A. PT Short Term Goal 3 (Week 1): Pt will ambulate with LRAD about 100 feet with min A. PT Short Term Goal 4 (Week 1): Pt will ascend/descend 4 stairs with B rails and min A. PT Short Term Goal 5 (Week 1): Pt will propel w/c about 50 feet with S.  Skilled Therapeutic Interventions/Progress Updates:    Session 1: Pt received in supine and agreeable to therapy. Pt's DIL, Vicky, present and asked to be checked off for transfers. Session focused on family education and gait. Vicky was instructed on and demonstrated safe guarding techniques for Stand pivot transfer, ambulatory transfer and short distance gait. Pt ambulated up to 180 ft with RW and min-CGA. Pt continues to require VC for upright posture and incr step length, as well as anterior weight shift to initiate Sit to stand. Pt returned to bed after session with supervision and was left with all needs in reach, bed alarm active, and her DIL present.   Session 2: Pt received seated EOB with her DIL present, who departed on therapist arrival. No complaint of pain. Session focused on gait training to improve step length and confidence. Pt transported to therapy gym for time. Pre gait activities in // bars, including weight shift in modified tandem stance, practice taking slow, big steps. Pt then ambulated with RW and frequent cues to maintain incr step length and upright posture x 90 ft, x 150 ft. Pt returned to bed after session and was left with all needs in reach and alarm active.   Therapy Documentation Precautions:  Precautions Precautions:  Fall Precaution Comments: Posterior lean in standing Restrictions Weight Bearing Restrictions: No General:     Therapy/Group: Individual Therapy  Juluis Rainier 08/14/2020, 12:26 PM

## 2020-08-14 NOTE — Progress Notes (Signed)
Speech Language Pathology Daily Session Note  Patient Details  Name: [place] MRN: [id number] Date of Birth: 23-Jun-1935  Today's Date: 08/14/2020 SLP Individual Time: 4627-0350 SLP Individual Time Calculation (min): 43 min  Short Term Goals: Week 1: SLP Short Term Goal 1 (Week 1): Patient will demonstrate selective attention to functional tasks in mild-to-moderately distracting environment with supervision verbal cues for redirection SLP Short Term Goal 2 (Week 1): Patient will complete mildly complex problem solving tasks with min A verbal cues SLP Short Term Goal 3 (Week 1): Patient will recall new, daily information with sup A verbal cues with use of compensatory memory strategies SLP Short Term Goal 4 (Week 1): Patient will complete functional medication management tasks with 90% accuracy and sup A verbal cues  Skilled Therapeutic Interventions:Skilled ST services focused on cognitive skills. Pt demonstrated general recall of pill organizer task and medication consumed 2-3x per day required supervision A verbal cues for verbal problem solving. SLP facilitated mildly complex problem solving and recall with in task skills in familiar calendar task an novel organization task (kitchen shelves.) Pt required only supervision A verbal cues for problem solving and mod I for correcting errors in familiar calendar task, however required mod A verbal cues in novel organization tasks for use of memory strategies and problem solving skills. SLP suspects pt will demonstrated improved performance if a similar task is completed in up coming sessions due to pt's ability to carryover taught strategies. Pt was left in room with call bell within reach and bed alarm set. SLP recommends to continue skilled services.     Pain Pain Assessment Pain Scale: 0-10 Pain Score: 0-No pain  Therapy/Group: Individual Therapy  Dorman Calderwood  [facility] 08/14/2020, 8:56 AM

## 2020-08-14 NOTE — Plan of Care (Signed)
  Problem: Consults Goal: RH GENERAL PATIENT EDUCATION Description: See Patient Education module for education specifics. Outcome: Progressing   Problem: RH SAFETY Goal: RH STG ADHERE TO SAFETY PRECAUTIONS W/ASSISTANCE/DEVICE Description: STG Adhere to Safety Precautions With cues and reminders Outcome: Progressing   Problem: RH PAIN MANAGEMENT Goal: RH STG PAIN MANAGED AT OR BELOW PT'S PAIN GOAL Description: Pain level less than 4 on scale of 0-10 Outcome: Progressing   Problem: RH BOWEL ELIMINATION Goal: RH STG MANAGE BOWEL WITH ASSISTANCE Description: STG Manage Bowel with mod I Assistance. Outcome: Progressing   Problem: RH BLADDER ELIMINATION Goal: RH STG MANAGE BLADDER WITH ASSISTANCE Description: STG Manage Bladder With mod I Assistance Outcome: Progressing   

## 2020-08-15 DIAGNOSIS — R5381 Other malaise: Secondary | ICD-10-CM | POA: Diagnosis not present

## 2020-08-15 NOTE — Progress Notes (Signed)
Occupational Therapy Session Note  Patient Details  Name: Sheila Andrade MRN: [id number] Date of Birth: [birth year]  Today's Date: 08/16/2020 OT Individual Time: 1104-1200 OT Individual Time Calculation (min): 56 min   Short Term Goals: Week 1:  OT Short Term Goal 1 (Week 1): Pt will complete 1/3 components of toileting with CGA for balance OT Short Term Goal 2 (Week 1): Pt will complete 2 grooming tasks while standing in front of the sink to increase standing endurance OT Short Term Goal 3 (Week 1): Pt will complete LB dressing with Mod A using adaptive techniques/AE as needed  Skilled Therapeutic Interventions/Progress Updates:    Pt greeted in bed with no c/o pain, already dressed and opting to skip bathing today. Agreeable to start session by brushing her teeth. Mod A and vcs for supine<sit with HOB elevated, using the bedrail. CGA for sit<stand and for short distance ambulatory transfer to the sink where she completed oral care in standing. Pt able to meet task demands without physical assistance or verbal cues, did report need to void bladder afterwards, CGA for functional ambulation to the toilet and for 3/3 components of toileting. Vcs for fully lowering clothing prior to voiding. Handwashing was then completed standing at the sink, slightly increased time for her to motor plan on her own. To work on praxis, dynamic standing balance, and standing endurance, pt participated in bedmaking tasks afterwards. She ambulated using RW around bed with CGA, vcs for side-stepping technique. She sat to don pillowcases with min cues. Pt then reported needing to void again. CGA for ambulatory transfer to the elevated toilet where pt had BM void, some of it incontinent. Min A for hygiene thoroughness overall and then pt  needed to change her pull up. Doffed/donned LB clothing. Min A for donning clean pull up, pants, and sneakers. Pt needing increased time and min cues for sequencing. After handwashing at the  sink again, pt resumed with bedmaking tasks using RW at the same assistance level. Mod A to apply the heavier blanket. At end of session pt transferred to the w/c. Left her with all needs within reach and safety belt fastened.   Therapy Documentation Precautions:  Precautions Precautions: Fall Precaution Comments: Posterior lean in standing Restrictions Weight Bearing Restrictions: No  Vital Signs: Therapy Vitals Temp: 98.2 F (36.8 C) Temp Source: Oral Pulse Rate: 64 Resp: 18 BP: 103/73 Patient Position (if appropriate): Sitting Oxygen Therapy SpO2: 100 % O2 Device: Room Air Pain: Pain Assessment Pain Scale: 0-10 Pain Score: 0-No pain ADL: ADL Eating: Not assessed Grooming: Supervision/safety Where Assessed-Grooming: Edge of bed Upper Body Bathing: Supervision/safety Where Assessed-Upper Body Bathing: Edge of bed Lower Body Bathing: Moderate assistance Where Assessed-Lower Body Bathing: Edge of bed Upper Body Dressing: Supervision/safety Where Assessed-Upper Body Dressing: Edge of bed Lower Body Dressing: Dependent Where Assessed-Lower Body Dressing: Edge of bed Toileting: Maximal assistance Where Assessed-Toileting: Bedside Commode Toilet Transfer: Moderate assistance Toilet Transfer Method: Stand pivot (RW) Toilet Transfer Equipment: Bedside commode Tub/Shower Transfer: Not assessed     Therapy/Group: Individual Therapy  Ytzel Gubler A Bronsyn Shappell 08/16/2020, 12:13 PM

## 2020-08-15 NOTE — Progress Notes (Signed)
Speech Language Pathology Daily Session Note  Patient Details  Name: Sheila Andrade MRN: [id number] Date of Birth: [birth year]  Today's Date: 08/15/2020 SLP Individual Time: 6734-1937 SLP Individual Time Calculation (min): 40 min  Short Term Goals: Week 1: SLP Short Term Goal 1 (Week 1): Patient will demonstrate selective attention to functional tasks in mild-to-moderately distracting environment with supervision verbal cues for redirection SLP Short Term Goal 2 (Week 1): Patient will complete mildly complex problem solving tasks with min A verbal cues SLP Short Term Goal 3 (Week 1): Patient will recall new, daily information with sup A verbal cues with use of compensatory memory strategies SLP Short Term Goal 4 (Week 1): Patient will complete functional medication management tasks with 90% accuracy and sup A verbal cues  Skilled Therapeutic Interventions:  Patient seen for skilled ST session with focus on cognitive function goals. Her daughter was present briefly at beginning of session. Patient required moderateA for completing initial complex level problem solving task and did appear to have difficulty focusing on and alternating attention between different pieces of information. During a different task which was more mildly complex, patient performed better, required only minA cues. SLP left two problem solving worksheets for patient to work on with her daughter. She continues to benefit from skilled SLP intervention to maximize cognitive function goals prior to discharge.  Pain Pain Assessment Pain Scale: 0-10 Pain Score: 0-No pain  Therapy/Group: Individual Therapy   Angela Nevin, MA, CCC-SLP Speech Therapy

## 2020-08-15 NOTE — Plan of Care (Signed)
  Problem: Consults Goal: RH GENERAL PATIENT EDUCATION Description: See Patient Education module for education specifics. Outcome: Progressing   Problem: RH SAFETY Goal: RH STG ADHERE TO SAFETY PRECAUTIONS W/ASSISTANCE/DEVICE Description: STG Adhere to Safety Precautions With cues and reminders Outcome: Progressing   Problem: RH PAIN MANAGEMENT Goal: RH STG PAIN MANAGED AT OR BELOW PT'S PAIN GOAL Description: Pain level less than 4 on scale of 0-10 Outcome: Progressing   Problem: RH BOWEL ELIMINATION Goal: RH STG MANAGE BOWEL WITH ASSISTANCE Description: STG Manage Bowel with mod I Assistance. Outcome: Progressing   Problem: RH BLADDER ELIMINATION Goal: RH STG MANAGE BLADDER WITH ASSISTANCE Description: STG Manage Bladder With mod I Assistance Outcome: Progressing

## 2020-08-15 NOTE — Progress Notes (Signed)
Physical Therapy Session Note  Patient Details  Name: Sheila Andrade MRN: [id number] Date of Birth: 07/15/1935  Today's Date: 08/15/2020 PT Individual Time: 1347-1415 PT Individual Time Calculation (min): 28 min   Short Term Goals: Week 1:  PT Short Term Goal 1 (Week 1): Pt with increase bed mobility to S. PT Short Term Goal 2 (Week 1): Pt wil increase transfers to min A. PT Short Term Goal 3 (Week 1): Pt will ambulate with LRAD about 100 feet with min A. PT Short Term Goal 4 (Week 1): Pt will ascend/descend 4 stairs with B rails and min A. PT Short Term Goal 5 (Week 1): Pt will propel w/c about 50 feet with S.  Skilled Therapeutic Interventions/Progress Updates:    Pt seated in w/c on arrival and agreeable to therapy. No complaint of pain. Pt transported to therapy gym for time management and energy conservation. Pt then participated in tandem standing with UUE and without UE support at times for improved balance and confidence with gait. Pt then ambulated several bouts of 150 ft with RW and CGA. Utilized music for therapeutic effect on movement. VC provided for longer step length and improved heel strike, VC and TC for upright posture. Pt demoed improved gait pattern with repetition and frequent cueing. Pt returned to room and sat in recliner, was left with all needs in reach and alarm active.   Therapy Documentation Precautions:  Precautions Precautions: Fall Precaution Comments: Posterior lean in standing Restrictions Weight Bearing Restrictions: No     Therapy/Group: Individual Therapy  Juluis Rainier 08/15/2020, 11:09 AM

## 2020-08-15 NOTE — Progress Notes (Signed)
PROGRESS NOTE   Subjective/Complaints:   Pt reports LBM 2 days ago- didn't have any side effects; but hasn't noticed any improvements so far.   Pt also actually animated for first few minutes.   ROS:  Pt denies SOB, abd pain, CP, N/V/C/D, and vision changes    Objective:   No results found. No results for input(s): WBC, HGB, HCT, PLT in the last 72 hours.  No results for input(s): NA, K, CL, CO2, GLUCOSE, BUN, CREATININE, CALCIUM in the last 72 hours.   Intake/Output Summary (Last 24 hours) at 08/15/2020 1015 Last data filed at 08/15/2020 4132 Gross per 24 hour  Intake 490 ml  Output --  Net 490 ml     Pressure Injury 08/02/20 Buttocks Left Stage 1 -  Intact skin with non-blanchable redness of a localized area usually over a bony prominence. (Active)  08/02/20 1845  Location: Buttocks  Location Orientation: Left  Staging: Stage 1 -  Intact skin with non-blanchable redness of a localized area usually over a bony prominence.  Wound Description (Comments):   Present on Admission: Yes    Physical Exam: Vital Signs Blood pressure 121/61, pulse 61, temperature 98.7 F (37.1 C), temperature source Oral, resp. rate 18, height 5\' 7"  (1.702 m), weight 55.1 kg, SpO2 99 %.    General: awake, alert, appropriate, NAD HENT: conjugate gaze; oropharynx moist; L eye injected from glaucoma CV: regular rate; no JVD Pulmonary: CTA B/L; no W/R/R- good air movement GI: soft, NT, ND, (+)BS Psychiatric: appropriate; much more animated initially- and making eye contact on and off- MUCH better than 2 days ago.  Neurological: alert- making eye contact spontaneously- more energy it appears.   Ext: no clubbing, cyanosis, or edema Skin: pale, warm, left buttock wound Neuro:  Alert and oriented x 3. Normal insight and awareness. Intact Memory. Normal language and speech. Cranial nerve exam unremarkable. UE 4+/5. LE 3-4/5 prox to distal.  No sensory findings. Mild tremor.  Musculoskeletal: normal ROM    Assessment/Plan: 1. Functional deficits which require 3+ hours per day of interdisciplinary therapy in a comprehensive inpatient rehab setting. Physiatrist is providing close team supervision and 24 hour management of active medical problems listed below. Physiatrist and rehab team continue to assess barriers to discharge/monitor patient progress toward functional and medical goals  Care Tool:  Bathing    Body parts bathed by patient: Right arm, Left arm   Body parts bathed by helper: Buttocks     Bathing assist Assist Level: Moderate Assistance - Patient 50 - 74%     Upper Body Dressing/Undressing Upper body dressing   What is the patient wearing?: Button up shirt    Upper body assist Assist Level: Supervision/Verbal cueing    Lower Body Dressing/Undressing Lower body dressing      What is the patient wearing?: Incontinence brief, Pants     Lower body assist Assist for lower body dressing: 2 Helpers     Toileting Toileting    Toileting assist Assist for toileting: Minimal Assistance - Patient > 75%     Transfers Chair/bed transfer  Transfers assist     Chair/bed transfer assist level: Minimal Assistance - Patient > 75%  Chair/bed transfer assistive device: Walker, Archivist   Ambulation assist   Ambulation activity did not occur: Safety/medical concerns  Assist level: Minimal Assistance - Patient > 75% Assistive device: Walker-rolling Max distance: 73ft   Walk 10 feet activity   Assist  Walk 10 feet activity did not occur: Safety/medical concerns  Assist level: Minimal Assistance - Patient > 75% Assistive device: Walker-rolling   Walk 50 feet activity   Assist Walk 50 feet with 2 turns activity did not occur: Safety/medical concerns  Assist level: (P) Moderate Assistance - Patient - 50 - 74% Assistive device: (P) Walker-rolling    Walk 150 feet  activity   Assist Walk 150 feet activity did not occur: (P) Safety/medical concerns         Walk 10 feet on uneven surface  activity   Assist Walk 10 feet on uneven surfaces activity did not occur: (P) Safety/medical concerns         Wheelchair     Assist Will patient use wheelchair at discharge?:  (yes per PT LTG) Type of Wheelchair: (P) Manual    Wheelchair assist level: (P) Total Assistance - Patient < 25% Max wheelchair distance: 50    Wheelchair 50 feet with 2 turns activity    Assist        Assist Level: (P) Total Assistance - Patient < 25%   Wheelchair 150 feet activity     Assist      Assist Level: (P) Total Assistance - Patient < 25%   Blood pressure 121/61, pulse 61, temperature 98.7 F (37.1 C), temperature source Oral, resp. rate 18, height 5\' 7"  (1.702 m), weight 55.1 kg, SpO2 99 %.  Medical Problem List and Plan: 1.  Functional deficits secondary to debility, gait disorder and failure to thrive             -patient may shower             -ELOS/Goals: 10-14 days, supervision to min assist with PT, OT and supervision with cognition Continue CIR- PT, OT and SLP  2.  Antithrombotics: -DVT/anticoagulation:  Pharmaceutical: Lovenox             -antiplatelet therapy: N/A 3. Pain Management: Tylenol prn.  4. Mood: Team to provide ego support. LCSW to follow for evaluation and support.              -antipsychotic agents: N/A 5. Neuropsych: This patient is capable of making decisions on her own behalf. 6. Skin/Wound Care: Routine pressure relief measures.  7. Fluids/Electrolytes/Protein deficient malnutrition: Monitor I/O. --Continue Ensure TID to help with malnutrition due to FTT. -intake much improved, sometimes a little sporadic -continue megace which has helped  8/9- eating better- will con't to monitor  8/10- ate 75% of breakfast this AM 8. SVT: HR with metoprolol 12.5 mg TID             --patient/family educated on  use of Vagal  maneuvers to reset to NSR. 9. Severe recurrent depression: Stable on prozac 10 mg daily.   8/8- per psychiatry, pt had memory issues with higher dose- added Vistaril for anxiety Sx's.   8/9- family insistent no memory issues due ot Prozac- was due to other meds- will try increasing Prozac to 20 mg daily and for anxiety trying Buspar 5 mg TID not Vistaril-  8/11- much more anaimated this AM, more energy- common for Prozac to increase energy BEFORE mood- but looks better 11. Vitamin B 12 deficiency: On  oral supplement. 12. Frequency/Incontinence: Improving?.              -monitor voiding patterns 13. HTN: Monitor BP tid--controlled on Metoprolol.  14. Constipation: had a large bm 8/6  -senokot-s at bedtime with daily miralax for now   8/8- LBM 8/6- if no BM by tomorrow, will intervene- is "normal" per pt.   8/11- wants to wait on bowel intervention 15. Shuffling gait? Parkinsonism?  8/9 - spoke with Neuro - no consult- they agreed that a low dose trial of Sinemet low dose TID could be worth trying- will try tomorrow if no side effects from other meds.   8/10- will start Low dose Sinemet- 10/100 mg TID  8/11- no side effects- says no better, but looks better- will d/w therapy.   LOS: 6 days A FACE TO FACE EVALUATION WAS PERFORMED  Sheila Andrade 08/15/2020, 10:15 AM

## 2020-08-15 NOTE — Progress Notes (Signed)
Occupational Therapy Session Note  Patient Details  Name: Sheila Andrade MRN: [id number] Date of Birth: 1935/01/20  Today's Date: 08/15/2020 OT Individual Time: 0626-9485 OT Individual Time Calculation (min): 56 min    Short Term Goals: Week 1:  OT Short Term Goal 1 (Week 1): Pt will complete 1/3 components of toileting with CGA for balance OT Short Term Goal 2 (Week 1): Pt will complete 2 grooming tasks while standing in front of the sink to increase standing endurance OT Short Term Goal 3 (Week 1): Pt will complete LB dressing with Mod A using adaptive techniques/AE as needed   Skilled Therapeutic Interventions/Progress Updates:    Pt greeted at time of session semireclined in bed resting no pain, agreeable to OT session. Donned footwear sitting EOB Mod A to fully push heels down in shoe prior to walking to sink level for face washing  with CGA standing balance. Pt needing to toilet at this time and walked sink > bathroom > wheelchair CGA with RW. 2/3 toileting tasks CGA but needed Min A for posterior hygiene after BM. Standing to wash hands at sink as well. Cues throughout functional mobility in room for increased stride length but difficulty improving. Transported dependent to gym for time and performed in standing 2x20 standing marches for single leg strengthening, 2 rounds of dynamic standing with alternating LE hitting target up to 3 colors in sequence. Static and dynamic standing on airex pad for static standing balance and lateral weight shifting to improve body awareness. Finished session with BUE there ex 2# dowel for bicep curl, chest press, overhead press for 1x10-15 reps. Back in room set up alarm on call bell in reach.    Therapy Documentation Precautions:  Precautions Precautions: Fall Precaution Comments: Posterior lean in standing Restrictions Weight Bearing Restrictions: No     Therapy/Group: Individual Therapy  Erasmo Score 08/15/2020, 7:15 AM

## 2020-08-15 NOTE — Progress Notes (Signed)
Physical Therapy Session Note  Patient Details  Name: Sheila Andrade MRN: [id number] Date of Birth: 1935-11-10  Today's Date: 08/15/2020 PT Individual Time: 1347-1415 PT Individual Time Calculation (min): 28 min   Short Term Goals: Week 1:  PT Short Term Goal 1 (Week 1): Pt with increase bed mobility to S. PT Short Term Goal 2 (Week 1): Pt wil increase transfers to min A. PT Short Term Goal 3 (Week 1): Pt will ambulate with LRAD about 100 feet with min A. PT Short Term Goal 4 (Week 1): Pt will ascend/descend 4 stairs with B rails and min A. PT Short Term Goal 5 (Week 1): Pt will propel w/c about 50 feet with S.  Skilled Therapeutic Interventions/Progress Updates:  Pt received sitting in WC in room and denied pain. Emphasis of session on anterior weight shifting and improved step length/clearance during gait. Pt was transported to day room w/total A for time management. Sit <> stand to RW w/supervision and verbal cues to scoot to edge of chair and push up w/both hands. Pt ambulated 150' w/RW and CGA, verbal cues to maintain trunk extension. Noted shuffling gait, increased festination when distracted or approaching turns and downward gaze. Inquired if pt felt as though her feet were "stuck to the ground" during gait and pt responded yes. Attempted to educate pt on Parkinsonian symptoms, but pt in disagreement. Tied a green theraband around front of pt's RW to provide visual and tactile cue to "kick towards the band" while walking for increased step clearance and length. Ambulated 150' w/theraband on RW w/CGA and noted significant improvement with posture, step length and decreased festination. Provided few verbal cues to remind pt to kick towards band when she began to demonstrate shuffling. Pt was transported back to room w/total A and performed stand pivot to bed w/supervision. Performed bed mobility w/supervision and pt was left supine in bed w/NT in room and all needs in reach.   Therapy  Documentation Precautions:  Precautions Precautions: Fall Precaution Comments: Posterior lean in standing Restrictions Weight Bearing Restrictions: No   Therapy/Group: Individual Therapy Jill Alexanders Wilmina Maxham, PT, DPT  08/15/2020, 7:45 AM

## 2020-08-16 DIAGNOSIS — G20 Parkinson's disease: Secondary | ICD-10-CM | POA: Diagnosis not present

## 2020-08-16 DIAGNOSIS — R5381 Other malaise: Secondary | ICD-10-CM | POA: Diagnosis not present

## 2020-08-16 DIAGNOSIS — R627 Adult failure to thrive: Secondary | ICD-10-CM | POA: Diagnosis not present

## 2020-08-16 NOTE — Progress Notes (Signed)
Speech Language Pathology Weekly Progress and Session Note  Patient Details  Name: Rufus Cypert MRN: [id number] Date of Birth: 02/19/1935  Beginning of progress report period: 08/10/2020 End of progress report period:  08/16/2020  Today's Date: 08/16/2020 SLP Individual Time: 0930-1015 SLP Individual Time Calculation (min): 45 min  Short Term Goals: Week 1: SLP Short Term Goal 1 (Week 1): Patient will demonstrate selective attention to functional tasks in mild-to-moderately distracting environment with supervision verbal cues for redirection SLP Short Term Goal 1 - Progress (Week 1): Met SLP Short Term Goal 2 (Week 1): Patient will complete mildly complex problem solving tasks with min A verbal cues SLP Short Term Goal 2 - Progress (Week 1): Met SLP Short Term Goal 3 (Week 1): Patient will recall new, daily information with sup A verbal cues with use of compensatory memory strategies SLP Short Term Goal 3 - Progress (Week 1): Met SLP Short Term Goal 4 (Week 1): Patient will complete functional medication management tasks with 90% accuracy and sup A verbal cues SLP Short Term Goal 4 - Progress (Week 1): Met    New Short Term Goals: Week 2: SLP Short Term Goal 1 (Week 2): STG=LTG  Weekly Progress Updates:  Patient made good progress, meeting all 4 STG's. She is currently at a supervision to minA cue level for mildly complex problem solving and reasoning tasks, and use of memory strategies.    Intensity: Minumum of 1-2 x/day, 30 to 90 minutes Frequency: 3 to 5 out of 7 days Duration/Length of Stay: 8/19 Treatment/Interventions: Cognitive remediation/compensation;Internal/external aids;Cueing hierarchy;Functional tasks;Medication managment;Patient/family education   Daily Session  Skilled Therapeutic Interventions: Patient seen for skilled ST session with focus on cognitive function goals. Patient sitting at EOB when SLP arrived and was agreeable to session. She completed mild complex  problem solving/logic puzzle similar to one completed previous session and demonstrated improved accuracy and requiring significantly less SLP assistance. She did require cues to keep trying when she was having some difficulty or frustration, saying "I give up". When asked how she thought she did compared to yesterday, she said a "slight bit better".      General    Pain Pain Assessment Pain Scale: 0-10 Pain Score: 0-No pain  Therapy/Group: Individual Therapy  Sonia Baller, MA, CCC-SLP Speech Therapy

## 2020-08-16 NOTE — Progress Notes (Signed)
PROGRESS NOTE   Subjective/Complaints:   Pt reports she's "peeing" on toilet- but denies constipation.  Slept OK- admits energy "still not good".  Walked back to bed with shuffling gait- very short/tiny steps. With NT and gait belt.   Ate <25% for breakfast- usually eats at least 50%.   ROS:   Pt denies SOB, abd pain, CP, N/V/C/D, and vision changes   Objective:   No results found. No results for input(s): WBC, HGB, HCT, PLT in the last 72 hours.  No results for input(s): NA, K, CL, CO2, GLUCOSE, BUN, CREATININE, CALCIUM in the last 72 hours.   Intake/Output Summary (Last 24 hours) at 08/16/2020 1257 Last data filed at 08/16/2020 0700 Gross per 24 hour  Intake 600 ml  Output --  Net 600 ml     Pressure Injury 08/02/20 Buttocks Left Stage 1 -  Intact skin with non-blanchable redness of a localized area usually over a bony prominence. (Active)  08/02/20 1845  Location: Buttocks  Location Orientation: Left  Staging: Stage 1 -  Intact skin with non-blanchable redness of a localized area usually over a bony prominence.  Wound Description (Comments):   Present on Admission: Yes    Physical Exam: Vital Signs Blood pressure 103/73, pulse 64, temperature 98.2 F (36.8 C), temperature source Oral, resp. rate 18, height 5\' 7"  (1.702 m), weight 55.1 kg, SpO2 100 %.     General: awake, alert, appropriate, on toilet, but walked to bed with RW/NT and gait belt- very tiny shuffling steps; NAD HENT: conjugate gaze; oropharynx moist; L eye less injected this AM CV: regular rate; no JVD Pulmonary: CTA B/L; no W/R/R- good air movement GI: soft, NT, ND, (+)BS; hypoactive BS Psychiatric: flat, but was more interactive than beginning of week- less so than yesterday-  Neurological: alert    Ext: no clubbing, cyanosis, or edema Skin: pale, warm, left buttock wound Neuro:  Alert and oriented x 3. Normal insight and awareness.  Intact Memory. Normal language and speech. Cranial nerve exam unremarkable. UE 4+/5. LE 3-4/5 prox to distal. No sensory findings. Mild tremor.  Musculoskeletal: normal ROM    Assessment/Plan: 1. Functional deficits which require 3+ hours per day of interdisciplinary therapy in a comprehensive inpatient rehab setting. Physiatrist is providing close team supervision and 24 hour management of active medical problems listed below. Physiatrist and rehab team continue to assess barriers to discharge/monitor patient progress toward functional and medical goals  Care Tool:  Bathing    Body parts bathed by patient: Right arm, Left arm   Body parts bathed by helper: Buttocks     Bathing assist Assist Level: Moderate Assistance - Patient 50 - 74%     Upper Body Dressing/Undressing Upper body dressing   What is the patient wearing?: Button up shirt    Upper body assist Assist Level: Supervision/Verbal cueing    Lower Body Dressing/Undressing Lower body dressing      What is the patient wearing?: Incontinence brief, Pants     Lower body assist Assist for lower body dressing: 2 Helpers     Toileting Toileting    Toileting assist Assist for toileting: Minimal Assistance - Patient > 75%  Transfers Chair/bed transfer  Transfers assist     Chair/bed transfer assist level: Minimal Assistance - Patient > 75% Chair/bed transfer assistive device: Walker, Archivist   Ambulation assist   Ambulation activity did not occur: Safety/medical concerns  Assist level: Minimal Assistance - Patient > 75% Assistive device: Walker-rolling Max distance: 39ft   Walk 10 feet activity   Assist  Walk 10 feet activity did not occur: Safety/medical concerns  Assist level: Minimal Assistance - Patient > 75% Assistive device: Walker-rolling   Walk 50 feet activity   Assist Walk 50 feet with 2 turns activity did not occur: Safety/medical concerns  Assist  level: (P) Moderate Assistance - Patient - 50 - 74% Assistive device: (P) Walker-rolling    Walk 150 feet activity   Assist Walk 150 feet activity did not occur: (P) Safety/medical concerns         Walk 10 feet on uneven surface  activity   Assist Walk 10 feet on uneven surfaces activity did not occur: (P) Safety/medical concerns         Wheelchair     Assist Will patient use wheelchair at discharge?:  (yes per PT LTG) Type of Wheelchair: (P) Manual    Wheelchair assist level: (P) Total Assistance - Patient < 25% Max wheelchair distance: 50    Wheelchair 50 feet with 2 turns activity    Assist        Assist Level: (P) Total Assistance - Patient < 25%   Wheelchair 150 feet activity     Assist      Assist Level: (P) Total Assistance - Patient < 25%   Blood pressure 103/73, pulse 64, temperature 98.2 F (36.8 C), temperature source Oral, resp. rate 18, height 5\' 7"  (1.702 m), weight 55.1 kg, SpO2 100 %.  Medical Problem List and Plan: 1.  Functional deficits secondary to debility, gait disorder and failure to thrive             -patient may shower             -ELOS/Goals: 10-14 days, supervision to min assist with PT, OT and supervision with cognition Continue CIR- PT, OT and SLP 2.  Antithrombotics: -DVT/anticoagulation:  Pharmaceutical: Lovenox             -antiplatelet therapy: N/A 3. Pain Management: Tylenol prn.   8/12- denies pain- con't regimen prn 4. Mood: Team to provide ego support. LCSW to follow for evaluation and support.              -antipsychotic agents: N/A 5. Neuropsych: This patient is capable of making decisions on her own behalf. 6. Skin/Wound Care: Routine pressure relief measures.  7. Fluids/Electrolytes/Protein deficient malnutrition: Monitor I/O. --Continue Ensure TID to help with malnutrition due to FTT. -intake much improved, sometimes a little sporadic -continue megace which has helped  8/11- had been eating >50%-  was <25% this AM- not sure why? Will monitor 8. SVT: HR with metoprolol 12.5 mg TID             --patient/family educated on  use of Vagal maneuvers to reset to NSR. 9. Severe recurrent depression: Stable on prozac 10 mg daily.   8/8- per psychiatry, pt had memory issues with higher dose- added Vistaril for anxiety Sx's.   8/9- family insistent no memory issues due ot Prozac- was due to other meds- will try increasing Prozac to 20 mg daily and for anxiety trying Buspar 5 mg TID not Vistaril-  8/11- much more anaimated this AM, more energy- common for Prozac to increase energy BEFORE mood- but looks better  8/12- still a little more interactive, however not normal- will look at increasing meds Monday 11. Vitamin B 12 deficiency: On  oral supplement. 12. Frequency/Incontinence: Improving?.              -monitor voiding patterns 13. HTN: Monitor BP tid--controlled on Metoprolol.  14. Constipation: had a large bm 8/6  -senokot-s at bedtime with daily miralax for now  8/12- LBM today- con't regimen 15. Shuffling gait? Parkinsonism?  8/9 - spoke with Neuro - no consult- they agreed that a low dose trial of Sinemet low dose TID could be worth trying- will try tomorrow if no side effects from other meds.   8/10- will start Low dose Sinemet- 10/100 mg TID 8/12- not quite as good today- if tolerated, can increase slightly this weekend-    LOS: 7 days A FACE TO FACE EVALUATION WAS PERFORMED  Lutie Pickler 08/16/2020, 12:57 PM

## 2020-08-16 NOTE — Discharge Instructions (Addendum)
Inpatient Rehab Discharge Instructions  Sheila Andrade Discharge date and time: No discharge date for patient encounter.   Activities/Precautions/ Functional Status: Activity: activity as tolerated Diet: regular diet Wound Care: Routine skin checks Functional status:  ___ No restrictions     ___ Walk up steps independently ___ 24/7 supervision/assistance   ___ Walk up steps with assistance ___ Intermittent supervision/assistance  ___ Bathe/dress independently ___ Walk with walker     __x_ Bathe/dress with assistance ___ Walk Independently    ___ Shower independently ___ Walk with assistance    ___ Shower with assistance ___ No alcohol     ___ Return to work/school ________  Special Instructions: No driving smoking or alcohol     COMMUNITY REFERRALS UPON DISCHARGE:    Home Health:   PT & OT                  Agency: [facility]  PHONE: [phone]  PSYCHIATRY: BEHAVIORAL HEALTH OUTPATIENT [phone]    Medical Equipment/Items Ordered:HOSPITAL BED & TUB TRANSFER BENCH                                                 Agency/Supplier:[facility]   [phone]    My questions have been answered and I understand these instructions. I will adhere to these goals and the provided educational materials after my discharge from the hospital.  Patient/Caregiver Signature _______________________________ Date __________  Clinician Signature _______________________________________ Date __________  Please bring this form and your medication list with you to all your follow-up doctor's appointments.

## 2020-08-16 NOTE — Progress Notes (Signed)
Physical Therapy Session Note  Patient Details  Name: Sheila Andrade MRN: [id number] Date of Birth: 1935/04/25  Today's Date: 08/16/2020 PT Individual Time: 1430-1530 PT Individual Time Calculation (min): 60 min   Short Term Goals: Week 1:  PT Short Term Goal 1 (Week 1): Pt with increase bed mobility to S. PT Short Term Goal 2 (Week 1): Pt wil increase transfers to min A. PT Short Term Goal 3 (Week 1): Pt will ambulate with LRAD about 100 feet with min A. PT Short Term Goal 4 (Week 1): Pt will ascend/descend 4 stairs with B rails and min A. PT Short Term Goal 5 (Week 1): Pt will propel w/c about 50 feet with S.  Skilled Therapeutic Interventions/Progress Updates:    Pt seated EOB on arrival, agreeable to therapy with no complaint of pain. Pt requested to use the bathroom, ambulatory transfer to toilet with RW and CGA. CGA for clothing management, supervision pericare. Hand hygiene completed in standing with supervision.   Session focused on improving gait mechanics. Pt ambulated bouts of 150 ft, with RW and CGA. 2 bouts attempted with HHA for improved confidence and to assess arm swing. Interventions included counting steps with cues to be as loud as possible, cues to kick band on walker, cues for heel toe, pregait stepping on targets.   Pt returned to room and returned to bed, sit>supine with supervision. Pt was left with all needs in reach and alarm active.   Therapy Documentation Precautions:  Precautions Precautions: Fall Precaution Comments: Posterior lean in standing Restrictions Weight Bearing Restrictions: No    Therapy/Group: Individual Therapy  Juluis Rainier 08/16/2020, 3:45 PM

## 2020-08-16 NOTE — Progress Notes (Signed)
Nutrition Follow-up  DOCUMENTATION CODES:   Non-severe (moderate) malnutrition in context of chronic illness  INTERVENTION:  Continue Ensure Enlive po TID, each supplement provides 350 kcal and 20 grams of protein.  Encourage adequate PO intake.   NUTRITION DIAGNOSIS:   Moderate Malnutrition related to chronic illness as evidenced by moderate fat depletion, moderate muscle depletion.  GOAL:   Patient will meet greater than or equal to 90% of their needs; progressing  MONITOR:   PO intake, Supplement acceptance, Skin, Weight trends, Labs, I & O's  REASON FOR ASSESSMENT:   Malnutrition Screening Tool    ASSESSMENT:   85 year old female with history of HTN, pelvic fx 01/2020 with progressive decline complicated by Covid infection, anorexia with wt loss, severe recurrent depression, recurrent UTIs who was originally admitted to [facility] on 07/14/[facility] with falls, shuffling gait with parkinsonian type symptoms, SI due to severe depression and FTT. She was admitted to CIR on 07/29 due to functional deficits from debility.  Meal completion has been varied from 10-100%. Pt currently has Ensure ordered. RD to continue with current orders to aid in caloric and protein needs. Pt encouraged to eat her food at meals and to drink her supplements.   NUTRITION - FOCUSED PHYSICAL EXAM:  Flowsheet Row Most Recent Value  Orbital Region Moderate depletion  Upper Arm Region Moderate depletion  Thoracic and Lumbar Region Unable to assess  Buccal Region Unable to assess  Temple Region Unable to assess  Clavicle Bone Region Moderate depletion  Clavicle and Acromion Bone Region Severe depletion  Scapular Bone Region Unable to assess  Dorsal Hand Unable to assess  Patellar Region Moderate depletion  Anterior Thigh Region Moderate depletion  Posterior Calf Region Moderate depletion  Edema (RD Assessment) None  Hair Reviewed  Eyes Reviewed  Mouth Reviewed  Skin Reviewed  Nails Reviewed      Labs  and medications reviewed.   Diet Order:   Diet Order             Diet regular Room service appropriate? Yes; Fluid consistency: Thin  Diet effective now                   EDUCATION NEEDS:   Not appropriate for education at this time  Skin:  Skin Assessment: Skin Integrity Issues: Skin Integrity Issues:: Stage I Stage I: buttocks  Last BM:  8/4  Height:   Ht Readings from Last 1 Encounters:  08/09/20 5\' 7"  (1.702 m)    Weight:   Wt Readings from Last 1 Encounters:  08/16/20 53.4 kg   BMI:  Body mass index is 18.44 kg/m.  Estimated Nutritional Needs:   Kcal:  1600-1800  Protein:  80-95 grams  Fluid:  > 1.6 L/day  10/16/20, MS, RD, LDN RD pager number/after hours weekend pager number on Amion.

## 2020-08-17 DIAGNOSIS — R5381 Other malaise: Secondary | ICD-10-CM | POA: Diagnosis not present

## 2020-08-17 DIAGNOSIS — F332 Major depressive disorder, recurrent severe without psychotic features: Secondary | ICD-10-CM

## 2020-08-17 DIAGNOSIS — E46 Unspecified protein-calorie malnutrition: Secondary | ICD-10-CM

## 2020-08-17 DIAGNOSIS — E8809 Other disorders of plasma-protein metabolism, not elsewhere classified: Secondary | ICD-10-CM

## 2020-08-17 DIAGNOSIS — Z8679 Personal history of other diseases of the circulatory system: Secondary | ICD-10-CM | POA: Diagnosis not present

## 2020-08-17 DIAGNOSIS — I10 Essential (primary) hypertension: Secondary | ICD-10-CM | POA: Diagnosis not present

## 2020-08-17 MED ORDER — PROSOURCE PLUS PO LIQD
30.0000 mL | Freq: Two times a day (BID) | ORAL | Status: DC
  Administered 2020-08-17 – 2020-08-18 (×3): 30 mL via ORAL
  Filled 2020-08-17 (×4): qty 30

## 2020-08-17 NOTE — Progress Notes (Signed)
PROGRESS NOTE   Subjective/Complaints: Patient seen laying in bed this morning.  She states she slept well overnight.  She denies complaints.   ROS: Denies CP, SOB, N/V/D  Objective:   No results found. No results for input(s): WBC, HGB, HCT, PLT in the last 72 hours.  No results for input(s): NA, K, CL, CO2, GLUCOSE, BUN, CREATININE, CALCIUM in the last 72 hours.   Intake/Output Summary (Last 24 hours) at 08/17/2020 1306 Last data filed at 08/17/2020 0700 Gross per 24 hour  Intake 450 ml  Output --  Net 450 ml      Pressure Injury 08/02/20 Buttocks Left Stage 1 -  Intact skin with non-blanchable redness of a localized area usually over a bony prominence. (Active)  08/02/20 1845  Location: Buttocks  Location Orientation: Left  Staging: Stage 1 -  Intact skin with non-blanchable redness of a localized area usually over a bony prominence.  Wound Description (Comments):   Present on Admission: Yes    Physical Exam: Vital Signs Blood pressure 100/61, pulse 68, temperature 98.8 F (37.1 C), temperature source Oral, resp. rate 17, height 5\' 7"  (1.702 m), weight 53.4 kg, SpO2 98 %. Constitutional: No distress . Vital signs reviewed. HENT: Normocephalic.  Atraumatic. Eyes: EOMI. No discharge. Cardiovascular: No JVD.  RRR. Respiratory: Normal effort.  No stridor.  Bilateral clear to auscultation. GI: Non-distended.  BS +. Skin: Warm and dry.  Buttock wound not examined today. Psych flat.  Disengaged. Musc: No edema in extremities.  No tenderness in extremities. Neuro: Alert and oriented Motor: Bilateral upper extremities: Grossly 4+/5 throughout Bilateral lower extremities: Grossly hip flexion, knee extension 4/5, ankle dorsiflexion 4+/5  Assessment/Plan: 1. Functional deficits which require 3+ hours per day of interdisciplinary therapy in a comprehensive inpatient rehab setting. Physiatrist is providing close team  supervision and 24 hour management of active medical problems listed below. Physiatrist and rehab team continue to assess barriers to discharge/monitor patient progress toward functional and medical goals  Care Tool:  Bathing    Body parts bathed by patient: Right arm, Left arm   Body parts bathed by helper: Buttocks     Bathing assist Assist Level: Moderate Assistance - Patient 50 - 74%     Upper Body Dressing/Undressing Upper body dressing   What is the patient wearing?: Button up shirt    Upper body assist Assist Level: Supervision/Verbal cueing    Lower Body Dressing/Undressing Lower body dressing      What is the patient wearing?: Incontinence brief, Pants     Lower body assist Assist for lower body dressing: 2 Helpers     Toileting Toileting    Toileting assist Assist for toileting: Minimal Assistance - Patient > 75%     Transfers Chair/bed transfer  Transfers assist     Chair/bed transfer assist level: Minimal Assistance - Patient > 75% Chair/bed transfer assistive device: Walker, Marland Kitchen   Ambulation assist   Ambulation activity did not occur: Safety/medical concerns  Assist level: Minimal Assistance - Patient > 75% Assistive device: Walker-rolling Max distance: 56ft   Walk 10 feet activity   Assist  Walk 10 feet activity did not occur: Safety/medical  concerns  Assist level: Minimal Assistance - Patient > 75% Assistive device: Walker-rolling   Walk 50 feet activity   Assist Walk 50 feet with 2 turns activity did not occur: Safety/medical concerns  Assist level: Minimal Assistance - Patient > 75% Assistive device: Walker-rolling    Walk 150 feet activity   Assist Walk 150 feet activity did not occur: Safety/medical concerns         Walk 10 feet on uneven surface  activity   Assist Walk 10 feet on uneven surfaces activity did not occur: Safety/medical concerns          Wheelchair     Assist Will patient use wheelchair at discharge?:  (yes per PT LTG) Type of Wheelchair: (P) Manual    Wheelchair assist level: (P) Total Assistance - Patient < 25% Max wheelchair distance: 50    Wheelchair 50 feet with 2 turns activity    Assist        Assist Level: Moderate Assistance - Patient 50 - 74%   Wheelchair 150 feet activity     Assist      Assist Level: Maximal Assistance - Patient 25 - 49%   Blood pressure 100/61, pulse 68, temperature 98.8 F (37.1 C), temperature source Oral, resp. rate 17, height 5\' 7"  (1.702 m), weight 53.4 kg, SpO2 98 %.  Medical Problem List and Plan: 1.  Functional deficits secondary to debility, gait disorder and failure to thrive   Continue CIR 2.  Antithrombotics: -DVT/anticoagulation:  Pharmaceutical: Lovenox             -antiplatelet therapy: N/A 3. Pain Management: Tylenol prn.   8/12- denies pain- con't regimen prn 4. Mood: Team to provide ego support. LCSW to follow for evaluation and support.              -antipsychotic agents: N/A 5. Neuropsych: This patient is capable of making decisions on her own behalf. 6. Skin/Wound Care: Routine pressure relief measures.  7. Fluids/Electrolytes/Protein deficient malnutrition: Monitor I/O. --Continue Ensure TID to help with malnutrition due to FTT. -continue megace which has helped  8. SVT: HR with metoprolol 12.5 mg TID             --patient/family educated on  use of Vagal maneuvers to reset to NSR.  Controlled on 8/13 9. Severe recurrent depression: Stable on prozac 10 mg daily.   8/8- per psychiatry, pt had memory issues with higher dose  Increased Prozac to 20 mg daily and for anxiety, Buspar 5 mg TID not Vistaril 11. Vitamin B 12 deficiency: On  oral supplement. 12. Frequency/Incontinence: Improving?.              -monitor voiding patterns 13. HTN: Monitor BP tid--controlled on Metoprolol.   Controlled on 8/13 14. Constipation:   -senokot-s at  bedtime with daily miralax for now  Improved 15. Shuffling gait? Parkinsonism?  Started low dose Sinemet- 10/100 mg TID on 8/10, will consider further increase 16.  Hypoalbuminemia  Supplement initiated on 8/13  LOS: 8 days A FACE TO FACE EVALUATION WAS PERFORMED  Neymar Dowe 9/13 08/17/2020, 1:06 PM

## 2020-08-17 NOTE — Progress Notes (Signed)
Speech Language Pathology Daily Session Note  Patient Details  Name: Sheila Andrade MRN: [id number] Date of Birth: Jul 14, 1935  Today's Date: 08/17/2020 SLP Individual Time: 7622-6333 SLP Individual Time Calculation (min): 52 min  Short Term Goals: Week 2: SLP Short Term Goal 1 (Week 2): STG=LTG  Skilled Therapeutic Interventions:Skilled ST services focused on cognitive skills. Pt requested to use bathroom, required mod A for sit to stand to RW and then only supervision A ambulating with RW. Pt urinated. SLP facilitated mildly complex problem solving, error awareness and recall strategies within task given 3 organization worksheets. Pt required mod A fade to min A verbal cues for problem solving, error awareness and the use of note-taking strategies to aid in recall. Pt often expressed feeling overwhelmed, required encouragement but was able to demonstrated problem solving with supervision A once SLP broken down problem and asked direct questions. Pt requested to end services 6 minutes early due to fatigue. Pt was left in room with call bell within reach and bed alarm set. SLP recommends to continue skilled services.     Pain Pain Assessment Pain Score: 0-No pain  Therapy/Group: Individual Therapy  Afifa Truax  [facility] 08/17/2020, 1:57 PM

## 2020-08-17 NOTE — Progress Notes (Signed)
Physical Therapy Weekly Progress Note  Patient Details  Name: Sheila Andrade MRN: [id number] Date of Birth: 1935-11-23  Beginning of progress report period: August 10, 2020 End of progress report period: August 17, 2020  Today's Date: 08/17/2020 PT Individual Time: 6948-5462 PT Individual Time Calculation (min): 60 min   Patient has met 4 of 5 short term goals.  Pt transfers with min A to CGA at times with frequent cueing for anterior weight shift. Bed mobility with supervision. Gait up to 150 ft with CGA and RW, continuing to progress toward normalized gait pattern. Continues to demo shuffling gait pattern when not cued to take "long steps."  Patient continues to demonstrate the following deficits muscle weakness, decreased cardiorespiratoy endurance, and decreased standing balance and decreased balance strategies and therefore will continue to benefit from skilled PT intervention to increase functional independence with mobility.  Patient progressing toward long term goals..  Continue plan of care.  PT Short Term Goals Week 1:  PT Short Term Goal 1 (Week 1): Pt with increase bed mobility to S. PT Short Term Goal 1 - Progress (Week 1): Met PT Short Term Goal 2 (Week 1): Pt wil increase transfers to min A. PT Short Term Goal 2 - Progress (Week 1): Met PT Short Term Goal 3 (Week 1): Pt will ambulate with LRAD about 100 feet with min A. PT Short Term Goal 3 - Progress (Week 1): Met PT Short Term Goal 4 (Week 1): Pt will ascend/descend 4 stairs with B rails and min A. PT Short Term Goal 4 - Progress (Week 1): Progressing toward goal PT Short Term Goal 5 (Week 1): Pt will propel w/c about 50 feet with S. PT Short Term Goal 5 - Progress (Week 1): Met Week 2:  PT Short Term Goal 1 (Week 2): =LTGs d/t ELOS  Skilled Therapeutic Interventions/Progress Updates:  Ambulation/gait training;Balance/vestibular training;Discharge planning;DME/adaptive equipment instruction;Functional mobility  training;Neuromuscular re-education;Patient/family education;Stair training;Therapeutic Activities;UE/LE Strength taining/ROM;UE/LE Coordination activities;Therapeutic Exercise;Visual/perceptual remediation/compensation;Wheelchair propulsion/positioning   Therapy Documentation Precautions:  Precautions Precautions: Fall Precaution Comments: Posterior lean in standing Restrictions Weight Bearing Restrictions: No  Skilled PT intervention pt received seated EOB with her daughter, Stanton Kidney present. No complaint of pain. Mary requested to be checked off for transfers. Stanton Kidney demonstrated safe guarding technique with ambulating short distances and transfers. Educated on use of gait belt and bed alarm and expressed understanding.   Pt transported to therapy gym for time management and energy conservation. ambulatory transfer to nustep with RW and CGA. Pt performed activity 3 x 4 min at level 5 and ~25 spm for integration of reciprocal movement pattern and cardiovascular endurance. Pt then ambulated 3 x 150 ft with seated rest breaks. Cues for incr stride length and using loud voice to count steps for improvement in parkinsonian gait pattern. Pt demoed poor performance of tasks compared to previous sessions,, with increased shuffling pattern and more difficulty maintaining incr stride length, likely due to fatigue from cardiovascular activity.    Therapy/Group: Individual Therapy  Mickel Fuchs 08/17/2020, 12:42 PM

## 2020-08-18 DIAGNOSIS — R2689 Other abnormalities of gait and mobility: Secondary | ICD-10-CM | POA: Diagnosis not present

## 2020-08-18 DIAGNOSIS — Z8679 Personal history of other diseases of the circulatory system: Secondary | ICD-10-CM | POA: Diagnosis not present

## 2020-08-18 DIAGNOSIS — R5381 Other malaise: Secondary | ICD-10-CM | POA: Diagnosis not present

## 2020-08-18 DIAGNOSIS — I10 Essential (primary) hypertension: Secondary | ICD-10-CM | POA: Diagnosis not present

## 2020-08-18 NOTE — Progress Notes (Signed)
Occupational Therapy Session Note  Patient Details  Name: Sheila Andrade MRN: [id number] Date of Birth: 14-Oct-1935  Today's Date: 08/18/2020 OT Individual Time: 1275-1700 OT Individual Time Calculation (min): 43 min    Short Term Goals: Week 1:  OT Short Term Goal 1 (Week 1): Pt will complete 1/3 components of toileting with CGA for balance OT Short Term Goal 2 (Week 1): Pt will complete 2 grooming tasks while standing in front of the sink to increase standing endurance OT Short Term Goal 3 (Week 1): Pt will complete LB dressing with Mod A using adaptive techniques/AE as needed   Skilled Therapeutic Interventions/Progress Updates:    Pt greeted at time of session sitting EOB with shoes already donned, no pain. Ambulated bed > gym with CGA and RW with wheelchair follow and no rest breaks, cues for longer steps and increasing stride length. Once EOM, performed 1x10 large therapy ball rolls for anterior weight shifting and promoting FWD reaching and decreasing posterior lean. 1x12 therapy ball tosses to promote reaction speed and core strengthening. Attempted 1x10 set of mini squats with difficulty following through and going low enough, switched to sit <> stands 1x5 with CGA each trial with improved anterior weight shifting each trial but still with extended time and some difficulty. Wheelchair transport back to room for time, walked to bathroom CGA with RW and clothing management/toilet transfer same manner before walking back to bed. Alarm on call bell in reach.   Therapy Documentation Precautions:  Precautions Precautions: Fall Precaution Comments: Posterior lean in standing Restrictions Weight Bearing Restrictions: No    Therapy/Group: Individual Therapy  Erasmo Score 08/18/2020, 12:56 PM

## 2020-08-18 NOTE — Progress Notes (Addendum)
Occupational Therapy Session Note  Patient Details  Name: Sheila Andrade MRN: [id number] Date of Birth: 10/20/1935  Today's Date: 08/19/2020 OT Individual Time: 8677-3736 and 1400-1441 OT Individual Time Calculation (min): 26 min and 41 min   Skilled Therapeutic Interventions/Progress Updates:    Pt greeted in bed, stating that she already used the restroom and brushed her teeth this morning. Already dressed as well (donned clean clothes last night), even wearing her sneakers. Supine<sit from flat bed without bedrails completed with min cues. Pt wanted to ambulate during session, therefore session focus was placed on dynamic standing balance for carryover during ADLs and functional transfers. Pt ambulated 36 meters x2 bouts in hallway+dayroom using RW with CGA, verbal and visual cues for increasing stride length due to small shuffling steps. Worked on functional cognition by pathfinding her way back to the room, pt needing mod cues overall and also assistance for remembering her room number. Pt transferred to flat bed, once again without using the bedrail with setup. Pt wanting to keep wearing her sneakers vs doff them prior to transfer. Pt left comfortably in bed with all needs within reach and bed alarm set.   2nd Session 1:1 tx (41 min) Pt greeted in bed, agreeable to session. Max A to don her shoes EOB. CGA for ambulatory transfer to the toilet using RW where pt had continent void of bladder. CGA-close supervision for 3/3 components of toileting. After handwashing at the sink, pt verbalized that she wanted to go to the therapy apartment, heard about it from a different therapist. Transported pt there via w/c and showed her the kitchen, living room, and bathroom areas. Pt did not want to practice kitchen mobility or transfer to bed or couch. Just wanted to see everything. Pt reported that thinking about light meal prep at this time made her feel "overwhelmed." She used the RW to ambulate back to the  room, verbal and visual cues for increasing stride length. Min cues to use external aides for pathfinding her way back to the room. Pt transferred back to bed at close of session. Left her with all needs within reach and bed alarm set.   Therapy Documentation Precautions:  Precautions Precautions: Fall Precaution Comments: Posterior lean in standing Restrictions Weight Bearing Restrictions: No  Pain: pt denied pain during both tx sessions Pain Assessment Pain Scale: 0-10 Pain Score: 0-No pain ADL: ADL Eating: Not assessed Grooming: Supervision/safety Where Assessed-Grooming: Edge of bed Upper Body Bathing: Supervision/safety Where Assessed-Upper Body Bathing: Edge of bed Lower Body Bathing: Moderate assistance Where Assessed-Lower Body Bathing: Edge of bed Upper Body Dressing: Supervision/safety Where Assessed-Upper Body Dressing: Edge of bed Lower Body Dressing: Dependent Where Assessed-Lower Body Dressing: Edge of bed Toileting: Maximal assistance Where Assessed-Toileting: Bedside Commode Toilet Transfer: Moderate assistance Toilet Transfer Method: Stand pivot (RW) Toilet Transfer Equipment: Bedside commode Tub/Shower Transfer: Not assessed      Therapy/Group: Individual Therapy  Tydus Sanmiguel A Raydel Hosick 08/19/2020, 12:28 PM

## 2020-08-18 NOTE — Progress Notes (Signed)
PROGRESS NOTE   Subjective/Complaints: Patient seen laying in bed this morning.  She states she slept well overnight.  Slightly more interactive this morning.   ROS: Denies CP, SOB, N/V/D  Objective:   No results found. No results for input(s): WBC, HGB, HCT, PLT in the last 72 hours.  No results for input(s): NA, K, CL, CO2, GLUCOSE, BUN, CREATININE, CALCIUM in the last 72 hours.   Intake/Output Summary (Last 24 hours) at 08/18/2020 0936 Last data filed at 08/18/2020 0900 Gross per 24 hour  Intake 360 ml  Output --  Net 360 ml      Pressure Injury 08/02/20 Buttocks Left Stage 1 -  Intact skin with non-blanchable redness of a localized area usually over a bony prominence. (Active)  08/02/20 1845  Location: Buttocks  Location Orientation: Left  Staging: Stage 1 -  Intact skin with non-blanchable redness of a localized area usually over a bony prominence.  Wound Description (Comments):   Present on Admission: Yes    Physical Exam: Vital Signs Blood pressure 109/61, pulse 64, temperature 98.3 F (36.8 C), temperature source Oral, resp. rate 18, height 5\' 7"  (1.702 m), weight 53.4 kg, SpO2 100 %. Constitutional: No distress . Vital signs reviewed. HENT: Normocephalic.  Atraumatic. Eyes: EOMI. No discharge. Cardiovascular: No JVD.  RRR. Respiratory: Normal effort.  No stridor.  Bilateral clear to auscultation. GI: Non-distended.  BS +. Skin: Warm and dry.  Intact. Psych: Flat, slightly improved. Musc: No edema in extremities.  No tenderness in extremities. Neuro: Alert and oriented  Motor: Bilateral upper extremities: Grossly 4+/5 throughout, unchanged Bilateral lower extremities: Grossly hip flexion, knee extension 4/5, ankle dorsiflexion 4+/5  Assessment/Plan: 1. Functional deficits which require 3+ hours per day of interdisciplinary therapy in a comprehensive inpatient rehab setting. Physiatrist is providing  close team supervision and 24 hour management of active medical problems listed below. Physiatrist and rehab team continue to assess barriers to discharge/monitor patient progress toward functional and medical goals  Care Tool:  Bathing    Body parts bathed by patient: Right arm, Left arm   Body parts bathed by helper: Buttocks     Bathing assist Assist Level: Moderate Assistance - Patient 50 - 74%     Upper Body Dressing/Undressing Upper body dressing   What is the patient wearing?: Button up shirt    Upper body assist Assist Level: Supervision/Verbal cueing    Lower Body Dressing/Undressing Lower body dressing      What is the patient wearing?: Incontinence brief, Pants     Lower body assist Assist for lower body dressing: 2 Helpers     Toileting Toileting    Toileting assist Assist for toileting: Minimal Assistance - Patient > 75%     Transfers Chair/bed transfer  Transfers assist     Chair/bed transfer assist level: Minimal Assistance - Patient > 75% Chair/bed transfer assistive device: Walker,   Ambulation assist   Ambulation activity did not occur: Safety/medical concerns  Assist level: Minimal Assistance - Patient > 75% Assistive device: Walker-rolling Max distance: 65ft   Walk 10 feet activity   Assist  Walk 10 feet activity did not occur: Safety/medical  concerns  Assist level: Minimal Assistance - Patient > 75% Assistive device: Walker-rolling   Walk 50 feet activity   Assist Walk 50 feet with 2 turns activity did not occur: Safety/medical concerns  Assist level: Minimal Assistance - Patient > 75% Assistive device: Walker-rolling    Walk 150 feet activity   Assist Walk 150 feet activity did not occur: Safety/medical concerns         Walk 10 feet on uneven surface  activity   Assist Walk 10 feet on uneven surfaces activity did not occur: Safety/medical concerns          Wheelchair     Assist Will patient use wheelchair at discharge?:  (yes per PT LTG) Type of Wheelchair: (P) Manual    Wheelchair assist level: (P) Total Assistance - Patient < 25% Max wheelchair distance: 50    Wheelchair 50 feet with 2 turns activity    Assist        Assist Level: Moderate Assistance - Patient 50 - 74%   Wheelchair 150 feet activity     Assist      Assist Level: Maximal Assistance - Patient 25 - 49%   Blood pressure 109/61, pulse 64, temperature 98.3 F (36.8 C), temperature source Oral, resp. rate 18, height 5\' 7"  (1.702 m), weight 53.4 kg, SpO2 100 %.  Medical Problem List and Plan: 1.  Functional deficits secondary to debility, gait disorder and failure to thrive   Continue CIR 2.  Antithrombotics: -DVT/anticoagulation:  Pharmaceutical: Lovenox             -antiplatelet therapy: N/A 3. Pain Management: Tylenol prn.   8/12- denies pain- con't regimen prn 4. Mood: Team to provide ego support. LCSW to follow for evaluation and support.              -antipsychotic agents: N/A 5. Neuropsych: This patient is capable of making decisions on her own behalf. 6. Skin/Wound Care: Routine pressure relief measures.  7. Fluids/Electrolytes/Protein deficient malnutrition: Monitor I/O. --Continue Ensure TID to help with malnutrition due to FTT. -continue megace which has helped  8. SVT: HR with metoprolol 12.5 mg TID             --patient/family educated on  use of Vagal maneuvers to reset to NSR.  Controlled on 8/14 9. Severe recurrent depression: Stable on prozac 10 mg daily.   8/8- per psychiatry, pt had memory issues with higher dose  Increased Prozac to 20 mg daily and for anxiety, Buspar 5 mg TID not Vistaril 11. Vitamin B 12 deficiency: On  oral supplement. 12. Frequency/Incontinence: Improving?.              -monitor voiding patterns 13. HTN: Monitor BP tid--controlled on metoprolol.   Controlled on 8/14 14. Constipation:   -senokot-s at  bedtime with daily miralax for now  Improved 15. Shuffling gait? Parkinsonism?  Started low dose Sinemet- 10/100 mg TID on 8/10, consider further increase if necessary 16.  Hypoalbuminemia  Supplement initiated on 8/13  LOS: 9 days A FACE TO FACE EVALUATION WAS PERFORMED  Sheila Andrade 9/13 08/18/2020, 9:36 AM

## 2020-08-19 DIAGNOSIS — R5381 Other malaise: Secondary | ICD-10-CM | POA: Diagnosis not present

## 2020-08-19 DIAGNOSIS — F322 Major depressive disorder, single episode, severe without psychotic features: Secondary | ICD-10-CM | POA: Diagnosis not present

## 2020-08-19 DIAGNOSIS — E440 Moderate protein-calorie malnutrition: Secondary | ICD-10-CM | POA: Insufficient documentation

## 2020-08-19 DIAGNOSIS — R2689 Other abnormalities of gait and mobility: Secondary | ICD-10-CM | POA: Diagnosis not present

## 2020-08-19 LAB — BASIC METABOLIC PANEL
Anion gap: 6 (ref 5–15)
BUN: 23 mg/dL (ref 8–23)
CO2: 23 mmol/L (ref 22–32)
Calcium: 9 mg/dL (ref 8.9–10.3)
Chloride: 107 mmol/L (ref 98–111)
Creatinine, Ser: 0.81 mg/dL (ref 0.44–1.00)
GFR, Estimated: >60 mL/min (ref >60)
Glucose, Bld: 91 mg/dL (ref 70–99)
Potassium: 4.1 mmol/L (ref 3.5–5.1)
Sodium: 136 mmol/L (ref 135–145)

## 2020-08-19 LAB — CBC
HCT: 35 % — ABNORMAL LOW (ref 36.0–46.0)
Hemoglobin: 11.5 g/dL — ABNORMAL LOW (ref 12.0–15.0)
MCH: 29.4 pg (ref 26.0–34.0)
MCHC: 32.9 g/dL (ref 30.0–36.0)
MCV: 89.5 fL (ref 80.0–100.0)
Platelets: 275 10*3/uL (ref 150–400)
RBC: 3.91 MIL/uL (ref 3.87–5.11)
RDW: 15.3 % (ref 11.5–15.5)
WBC: 7.4 10*3/uL (ref 4.0–10.5)
nRBC: 0 % (ref 0.0–0.2)

## 2020-08-19 MED ORDER — CARBIDOPA-LEVODOPA 10-100 MG PO TABS
2.0000 | ORAL_TABLET | Freq: Three times a day (TID) | ORAL | Status: DC
  Administered 2020-08-19: 1 via ORAL
  Administered 2020-08-19 – 2020-08-23 (×12): 2 via ORAL
  Filled 2020-08-19 (×14): qty 2

## 2020-08-19 MED ORDER — FLUOXETINE HCL 20 MG PO CAPS
30.0000 mg | ORAL_CAPSULE | Freq: Every day | ORAL | Status: DC
  Administered 2020-08-19 – 2020-08-23 (×5): 30 mg via ORAL
  Filled 2020-08-19 (×5): qty 1

## 2020-08-19 MED ORDER — MEGESTROL ACETATE 400 MG/10ML PO SUSP
800.0000 mg | Freq: Every day | ORAL | Status: DC
  Administered 2020-08-20 – 2020-08-23 (×4): 800 mg via ORAL
  Filled 2020-08-19 (×4): qty 20

## 2020-08-19 NOTE — Progress Notes (Signed)
Occupational Therapy Weekly Progress Note  Patient Details  Name: Sheila Andrade MRN: [id number] Date of Birth: [birth year]  Beginning of progress report period: August 10, 2020 End of progress report period: August 19, 2020    Patient has met 2 of 4 short term goals.  Pt has improved with OT services and is completed ambulatory transfers throughout her room with CGA with RW, with improved posterior bias/lean, and is able to complete 2/3 toileting tasks for clothing management with CGA but does need Min A after BM. Pt has declined bathing tasks recently but plan to continue to work toward this goal. Pt has been limited by small parkinsonian like movements, shuffling gait, and extended time for all tasks. Continue POC with plants to DC home with 24/7 from family whom is very involved.   Patient continues to demonstrate the following deficits: muscle weakness, decreased cardiorespiratoy endurance, impaired timing and sequencing, decreased coordination, and decreased motor planning, decreased motor planning, and decreased sitting balance, decreased standing balance, decreased postural control, and decreased balance strategies and therefore will continue to benefit from skilled OT intervention to enhance overall performance with BADL.  Patient progressing toward long term goals..  Continue plan of care.  OT Short Term Goals Week 1:  OT Short Term Goal 1 (Week 1): Pt will complete 1/3 components of toileting with CGA for balance OT Short Term Goal 1 - Progress (Week 1): Met OT Short Term Goal 2 (Week 1): Pt will complete 2 grooming tasks while standing in front of the sink to increase standing endurance OT Short Term Goal 2 - Progress (Week 1): Met OT Short Term Goal 3 (Week 1): Pt will complete LB dressing with Mod A using adaptive techniques/AE as needed OT Short Term Goal 3 - Progress (Week 1): Progressing toward goal OT Short Term Goal 4 - Progress (Week 1): Progressing toward goal Week 2:  OT  Short Term Goal 1 (Week 2): STGs = LTGs d/t ELOS   Therapy Documentation Precautions:  Precautions Precautions: Fall Precaution Comments: Posterior lean in standing Restrictions Weight Bearing Restrictions: No    [name] 08/19/2020, 5:18 PM

## 2020-08-19 NOTE — Progress Notes (Addendum)
PROGRESS NOTE   Subjective/Complaints:  Pt reports "mood pretty good", but then admitted she cannot say if she's "better".  Also admits when asked that she used to be very interactive, talking with everyone and admits that's not her right now.   Ate 25% of breakfast tray.     ROS:  Pt denies SOB, abd pain, CP, N/V/C/D, and vision changes    Objective:   No results found. Recent Labs    08/19/20 0633  WBC 7.4  HGB 11.5*  HCT 35.0*  PLT 275    Recent Labs    08/19/20 0633  NA 136  K 4.1  CL 107  CO2 23  GLUCOSE 91  BUN 23  CREATININE 0.81  CALCIUM 9.0     Intake/Output Summary (Last 24 hours) at 08/19/2020 0915 Last data filed at 08/19/2020 1601 Gross per 24 hour  Intake 836 ml  Output --  Net 836 ml     Pressure Injury 08/02/20 Buttocks Left Stage 1 -  Intact skin with non-blanchable redness of a localized area usually over a bony prominence. (Active)  08/02/20 1845  Location: Buttocks  Location Orientation: Left  Staging: Stage 1 -  Intact skin with non-blanchable redness of a localized area usually over a bony prominence.  Wound Description (Comments):   Present on Admission: Yes    Physical Exam: Vital Signs Blood pressure (!) 145/69, pulse (!) 52, temperature 98.2 F (36.8 C), temperature source Oral, resp. rate 18, height 5\' 7"  (1.702 m), weight 53.3 kg, SpO2 92 %.    General: awake, alert, appropriate, sitting up in bed- tray pushed away- 25% eaten;  NAD HENT: conjugate gaze; oropharynx moist CV: slightly bradycardic; ; no JVD Pulmonary: CTA B/L; no W/R/R- good air movement GI: soft, NT, ND, (+)BS; normoactive Psychiatric: flat; less interactive today than last week, but not as bad as initially; Neurological: alert; less eye contact this AM, but more than initially Skin: Warm and dry.  Intact.  Musc: No edema in extremities.  No tenderness in extremities. Motor: Bilateral upper  extremities: Grossly 4+/5 throughout, unchanged Bilateral lower extremities: Grossly hip flexion, knee extension 4/5, ankle dorsiflexion 4+/5  Assessment/Plan: 1. Functional deficits which require 3+ hours per day of interdisciplinary therapy in a comprehensive inpatient rehab setting. Physiatrist is providing close team supervision and 24 hour management of active medical problems listed below. Physiatrist and rehab team continue to assess barriers to discharge/monitor patient progress toward functional and medical goals  Care Tool:  Bathing    Body parts bathed by patient: Right arm, Left arm   Body parts bathed by helper: Buttocks     Bathing assist Assist Level: Moderate Assistance - Patient 50 - 74%     Upper Body Dressing/Undressing Upper body dressing   What is the patient wearing?: Button up shirt    Upper body assist Assist Level: Supervision/Verbal cueing    Lower Body Dressing/Undressing Lower body dressing      What is the patient wearing?: Incontinence brief, Pants     Lower body assist Assist for lower body dressing: 2 Helpers     Toileting Toileting    Toileting assist Assist for toileting: Contact Guard/Touching  assist     Transfers Chair/bed transfer  Transfers assist     Chair/bed transfer assist level: Minimal Assistance - Patient > 75% Chair/bed transfer assistive device: Walker, Armrests   Locomotion Ambulation   Ambulation assist   Ambulation activity did not occur: Safety/medical concerns  Assist level: Minimal Assistance - Patient > 75% Assistive device: Walker-rolling Max distance: 72ft   Walk 10 feet activity   Assist  Walk 10 feet activity did not occur: Safety/medical concerns  Assist level: Minimal Assistance - Patient > 75% Assistive device: Walker-rolling   Walk 50 feet activity   Assist Walk 50 feet with 2 turns activity did not occur: Safety/medical concerns  Assist level: Minimal Assistance - Patient >  75% Assistive device: Walker-rolling    Walk 150 feet activity   Assist Walk 150 feet activity did not occur: Safety/medical concerns         Walk 10 feet on uneven surface  activity   Assist Walk 10 feet on uneven surfaces activity did not occur: Safety/medical concerns         Wheelchair     Assist Will patient use wheelchair at discharge?:  (yes per PT LTG) Type of Wheelchair: (P) Manual    Wheelchair assist level: (P) Total Assistance - Patient < 25% Max wheelchair distance: 50    Wheelchair 50 feet with 2 turns activity    Assist        Assist Level: Moderate Assistance - Patient 50 - 74%   Wheelchair 150 feet activity     Assist      Assist Level: Maximal Assistance - Patient 25 - 49%   Blood pressure (!) 145/69, pulse (!) 52, temperature 98.2 F (36.8 C), temperature source Oral, resp. rate 18, height 5\' 7"  (1.702 m), weight 53.3 kg, SpO2 92 %.  Medical Problem List and Plan: 1.  Functional deficits secondary to debility, gait disorder and failure to thrive   Con't CIR PT, OT and SLP 2.  Antithrombotics: -DVT/anticoagulation:  Pharmaceutical: Lovenox             -antiplatelet therapy: N/A 3. Pain Management: Tylenol prn.   8/15- denies pain- con't prn regimen 4. Mood: Team to provide ego support. LCSW to follow for evaluation and support.              -antipsychotic agents: N/A 5. Neuropsych: This patient is capable of making decisions on her own behalf. 6. Skin/Wound Care: Routine pressure relief measures.  7. Fluids/Electrolytes/Protein deficient malnutrition: Monitor I/O. --Continue Ensure TID to help with malnutrition due to FTT. -continue megace which has helped  8/15- meets criteria for moderate malnutrition- ate 25% of breakfast- will increase Megace to 800 mg daily 8. SVT: HR with metoprolol 12.5 mg TID             --patient/family educated on  use of Vagal maneuvers to reset to NSR.  8/15- HR 52 this AM- con't to  monitor 9. Severe recurrent depression: Stable on prozac 10 mg daily.   8/8- per psychiatry, pt had memory issues with higher dose  Increased Prozac to 20 mg daily and for anxiety, Buspar 5 mg TID not Vistaril  8/15- increased Prozac to 30 mg daily 11. Vitamin B 12 deficiency: On  oral supplement. 12. Frequency/Incontinence: Improving?.              -monitor voiding patterns 13. HTN: Monitor BP tid--controlled on metoprolol.   Controlled on 8/14 14. Constipation:   -senokot-s at bedtime with daily  miralax for now  Improved 15. Shuffling gait? Parkinsonism?  Started low dose Sinemet- 10/100 mg TID on 8/10, consider further increase if necessary  8/15- increased to 2 pills of 10/100 mg TID- will monitor closely.  16.  Hypoalbuminemia  Supplement initiated on 8/13 17. Dispo  8/15- have discussed pt with daughter and the plan  I spent a total of 37 minutes on visit- total care- >50% on coordination of care-  Calling daughter and seeing pt.    LOS: 10 days A FACE TO FACE EVALUATION WAS PERFORMED  Sheila Andrade 08/19/2020, 9:15 AM

## 2020-08-19 NOTE — Progress Notes (Signed)
Speech Language Pathology Daily Session Note  Patient Details  Name: Sheila Andrade MRN: [id number] Date of Birth: February 08, 1935  Today's Date: 08/19/2020 SLP Individual Time: 4627-0350 and 0938-1829 SLP Individual Time Calculation (min): 30 min and 15 min  Short Term Goals: Week 2: SLP Short Term Goal 1 (Week 2): STG=LTG  Skilled Therapeutic Interventions: 1# Skilled ST services focused on cognitive skills. SLP facilitated complex problem solving in scheduling task, however after several attempts to break down sentences to understand implicit information pt continued to state " I don't know" and then finally requested to not do "anymore paper tasks." SLP provided education pertaining to need to strengthen higher level problem solving, recall and error awareness skills, pt politely decline focusing on these skills in written tasks. SLP then facilitated short term recall skills with use of visualization and chunking strategy after listening to short paragraphs pt was able to recall detailed information with supervision A verbal cues. Pt was left in room with call bell within reach and bed alarm set. SLP recommends to continue skilled services.  2# Skilled ST services focused on cognitive skills. SLP facilitated recall of am treatment session, pt required supervision A verbal cues to recall events. SLP updated medication list posted in room and pt required supervision A verbal cues to recall name/function/times per day given list. , Pt was left in room with call bell within reach and bed alarm set. SLP recommends to continue skilled services.     Pain Pain Assessment Pain Score: 0-No pain  Therapy/Group: Individual Therapy  Monicia Tse  [facility] 08/19/2020, 2:47 PM

## 2020-08-19 NOTE — Progress Notes (Signed)
Physical Therapy Session Note  Patient Details  Name: Sheila Andrade MRN: [id number] Date of Birth: Oct 09, 1935  Today's Date: 08/19/2020 PT Individual Time: 1100-1200 PT Individual Time Calculation (min): 60 min   Short Term Goals: Week 1:  PT Short Term Goal 1 (Week 1): Pt with increase bed mobility to S. PT Short Term Goal 1 - Progress (Week 1): Met PT Short Term Goal 2 (Week 1): Pt wil increase transfers to min A. PT Short Term Goal 2 - Progress (Week 1): Met PT Short Term Goal 3 (Week 1): Pt will ambulate with LRAD about 100 feet with min A. PT Short Term Goal 3 - Progress (Week 1): Met PT Short Term Goal 4 (Week 1): Pt will ascend/descend 4 stairs with B rails and min A. PT Short Term Goal 4 - Progress (Week 1): Progressing toward goal PT Short Term Goal 5 (Week 1): Pt will propel w/c about 50 feet with S. PT Short Term Goal 5 - Progress (Week 1): Met  Skilled Therapeutic Interventions/Progress Updates:    Pt sleeping on arrival and easily roused by voice. Pt demoed increased fatigue throughout session, including saying she couldn't speak much because, "it takes energy." Agreeable to therapy. Gait with RW and CGA x200 to and from gym. Pt had increased difficulty maintaining normalized gait pattern, frequently reverting to shuffling gait pattern. Pt performed 3 x 5 Sit to stand with cues for anterior weight shift. Pt progressed to supervision with minimal cueing. Upon transferring to w/c, pt then had difficulty and required min A to stand. Pt navigated 6" steps, x 4, 2 x 8 with CGA and BUE support. Pt required seated rest break while ambulating back to room d/t fatigue. Upon returning to room, pt used bathroom. CGA for clothing management supervision for pericare. Pt returned to bed and was immediately sleepy but responsive. Pt was left with all needs in reach and alarm active.   Therapy Documentation Precautions:  Precautions Precautions: Fall Precaution Comments: Posterior lean in  standing Restrictions Weight Bearing Restrictions: No    Therapy/Group: Individual Therapy  Mickel Fuchs 08/19/2020, 12:15 PM

## 2020-08-19 NOTE — Plan of Care (Signed)
  Problem: Consults Goal: RH GENERAL PATIENT EDUCATION Description: See Patient Education module for education specifics. Outcome: Progressing   Problem: RH SAFETY Goal: RH STG ADHERE TO SAFETY PRECAUTIONS W/ASSISTANCE/DEVICE Description: STG Adhere to Safety Precautions With cues and reminders Outcome: Progressing   Problem: RH PAIN MANAGEMENT Goal: RH STG PAIN MANAGED AT OR BELOW PT'S PAIN GOAL Description: Pain level less than 4 on scale of 0-10 Outcome: Progressing   Problem: RH BOWEL ELIMINATION Goal: RH STG MANAGE BOWEL WITH ASSISTANCE Description: STG Manage Bowel with mod I Assistance. Outcome: Progressing   Problem: RH BLADDER ELIMINATION Goal: RH STG MANAGE BLADDER WITH ASSISTANCE Description: STG Manage Bladder With mod I Assistance Outcome: Progressing   

## 2020-08-19 NOTE — Progress Notes (Signed)
Called on call provider, Vertis Kelch and made provider aware that daughter, Corrie Dandy requested that medications that was increased today be changed back to normal dose. Per daughter's request  due to patient increase  in jittery and increase in feelings about being prepared for discharge. Will continue to monitor

## 2020-08-20 DIAGNOSIS — R5381 Other malaise: Secondary | ICD-10-CM | POA: Diagnosis not present

## 2020-08-20 NOTE — Progress Notes (Signed)
Physical Therapy Session Note  Patient Details  Name: Sheila Andrade MRN: [id number] Date of Birth: 01-23-1935  Today's Date: 08/20/2020 PT Individual Time: 1400-1500 PT Individual Time Calculation (min): 60 min   Short Term Goals: Week 2:  PT Short Term Goal 1 (Week 2): =LTGs d/t ELOS  Skilled Therapeutic Interventions/Progress Updates: Pt presents sitting in chair and ready for therapy.  Pt transfers multiple trials throughout session from all surfaces (w/c, bed, mat table, arm chair, armless chair) w/ CGA and verbal cues for correct sequencing and positioning.  Pt requires verbal cues for increased forward lean to perform.  Pt amb multiple trials w/ RW and CGA up to 170'.  Pt amb w/ flat-footed acceptance and gradual regression to a more shuffling gait pattern.  Pt improved foot clearance and step length w/ counting method, but then reverts to shuffling.  Pt negotiated 4 steps w/ R rail and HHA on Left w/ CGA, step-to gait pattern.  Pt still states fearful of falling 2/2 past.  Pt returned to bed and required supervision for sit to supine.  Bed alarm on and all needs in reach.     Therapy Documentation Precautions:  Precautions Precautions: Fall Precaution Comments: Posterior lean in standing Restrictions Weight Bearing Restrictions: No General:   Vital Signs: Therapy Vitals Temp: 98.4 F (36.9 C) Temp Source: Oral Pulse Rate: 63 Resp: 16 BP: 111/70 Patient Position (if appropriate): Lying Oxygen Therapy SpO2: 100 % O2 Device: Room Air Pain:no c/o       Therapy/Group: Individual Therapy  Lucio Edward 08/20/2020, 3:54 PM

## 2020-08-20 NOTE — Progress Notes (Signed)
Physical Therapy Session Note  Patient Details  Name: Sheila Andrade MRN: [id number] Date of Birth: [birth year]  Today's Date: 08/20/2020 PT Individual Time: 1884-1660 PT Individual Time Calculation (min): 29 min   Short Term Goals: Week 1:  PT Short Term Goal 1 (Week 1): Pt with increase bed mobility to S. PT Short Term Goal 1 - Progress (Week 1): Met PT Short Term Goal 2 (Week 1): Pt wil increase transfers to min A. PT Short Term Goal 2 - Progress (Week 1): Met PT Short Term Goal 3 (Week 1): Pt will ambulate with LRAD about 100 feet with min A. PT Short Term Goal 3 - Progress (Week 1): Met PT Short Term Goal 4 (Week 1): Pt will ascend/descend 4 stairs with B rails and min A. PT Short Term Goal 4 - Progress (Week 1): Progressing toward goal PT Short Term Goal 5 (Week 1): Pt will propel w/c about 50 feet with S. PT Short Term Goal 5 - Progress (Week 1): Met Week 2:  PT Short Term Goal 1 (Week 2): =LTGs d/t ELOS  Skilled Therapeutic Interventions/Progress Updates:  Patient seated in w/c on entrance to room. Patient alert and agreeable to PT session with very flat affect. Patient with no pain complaint during session.  Therapeutic Activity: Transfers: Patient performed STS and SPVT transfers slowly throughout session with CGA. Provided verbal cues for safe technique/ positioning.  Gait Training:  Patient ambulated 165' x1 using RW and CGA. Demonstrating decreased step length/ height indicative of more Parkinsonian gait requiring vc throughout for larger steps. Visual cue of green t-band on RW indicated to pt. Following NMR, improved quality of gait noted for first 30' with heel strike bilaterally and increased step length with LE close to green t-band. Then intermittent cueing throughout amb bout to continue heel strike with diminished return throughout trip back to room.   Neuromuscular Re-ed: NMR facilitated during session with focus on increasing hip/ knee flexion, heel striking for  improved quality of gait. Pt guided in seated toe tap to taget, followed by heel tap to target. Pt progressed to use of w/c for BLE propel only in order to encourage hard heel strike, increased step length and significant hamstring pull with each LE prior to use of contralateral LE. She is able to demonstrate good technique with min instruction and correction. Demonstrated visual reasoning for activity to encourage carryover into increased step length/ height and heel strike in ambulation with good results in ambulation initially. NMR performed for improvements in motor control and coordination, balance, sequencing, judgement, and self confidence/ efficacy in performing all aspects of mobility at highest level of independence.   Patient seated upright  in w/c at end of session with brakes locked, seat alarm set, and all needs within reach. Oriented to next therapy session.      Therapy Documentation Precautions:  Precautions Precautions: Fall Precaution Comments: Posterior lean in standing Restrictions Weight Bearing Restrictions: No  Therapy/Group: Individual Therapy  Alger Simons PT, DPT 08/20/2020, 12:49 PM

## 2020-08-20 NOTE — Progress Notes (Signed)
Occupational Therapy Session Note  Patient Details  Name: Sheila Andrade MRN: [id number] Date of Birth: October 16, 1935  Today's Date: 08/20/2020 OT Individual Time: 4383-7793 OT Individual Time Calculation (min): 55 min    Short Term Goals: Week 1:  OT Short Term Goal 1 (Week 1): Pt will complete 1/3 components of toileting with CGA for balance OT Short Term Goal 1 - Progress (Week 1): Met OT Short Term Goal 2 (Week 1): Pt will complete 2 grooming tasks while standing in front of the sink to increase standing endurance OT Short Term Goal 2 - Progress (Week 1): Met OT Short Term Goal 3 (Week 1): Pt will complete LB dressing with Mod A using adaptive techniques/AE as needed OT Short Term Goal 3 - Progress (Week 1): Progressing toward goal OT Short Term Goal 4 - Progress (Week 1): Progressing toward goal Week 2:  OT Short Term Goal 1 (Week 2): STGs = LTGs d/t ELOS  Skilled Therapeutic Interventions/Progress Updates:    Pt greeted at time of session sitting up in wheelchair no c/o pain and agreeable to OT session. Offered ADL as pt has not showered in several days, continued to decline. Engaged in conversation regarding why pt does not want to shower, she is afraid of falling especially in wet shower environment. Discussed ways to decrease falls and that pt would be constantly supervised. Plan to shower tomorrow. Ambulated chair > bathroom CGA with RW and toilet transfer same manner, 3/3 toileting tasks with CGA urine only but noted to have soiled brief as well. Encouragement needed for it to be ok with changing brief/depend, returned to sitting to doff LB clothing fully. Donned new brief and pants with Mod A to thread BLEs and pt able to stand to don over hips with CGA. Walk in shower transfer using bench to improve her comfort and decrease fear with CGA in prep for shower tomorrow. Transported to gym dependent for time and focused on using band to simulate pants and pt able to"thread" with CGA including  over hips. Remainder of session on anterior weight shifting for picking up cones off floor/low surfaces and standing single leg stance with large stepping movements tapping on cones. Transported back to room wiuth alarm on call bell in reach.   Therapy Documentation Precautions:  Precautions Precautions: Fall Precaution Comments: Posterior lean in standing Restrictions Weight Bearing Restrictions: No    Therapy/Group: Individual Therapy  [name] 08/20/2020, 7:24 AM

## 2020-08-20 NOTE — Progress Notes (Signed)
Physical Therapy Session Note  Patient Details  Name: Sheila Andrade MRN: [id number] Date of Birth: [birth year]  Today's Date: 08/20/2020 PT Individual Time: 0800-0859 PT Individual Time Calculation (min): 59 min   Short Term Goals: Week 1:  PT Short Term Goal 1 (Week 1): Pt with increase bed mobility to S. PT Short Term Goal 1 - Progress (Week 1): Met PT Short Term Goal 2 (Week 1): Pt wil increase transfers to min A. PT Short Term Goal 2 - Progress (Week 1): Met PT Short Term Goal 3 (Week 1): Pt will ambulate with LRAD about 100 feet with min A. PT Short Term Goal 3 - Progress (Week 1): Met PT Short Term Goal 4 (Week 1): Pt will ascend/descend 4 stairs with B rails and min A. PT Short Term Goal 4 - Progress (Week 1): Progressing toward goal PT Short Term Goal 5 (Week 1): Pt will propel w/c about 50 feet with S. PT Short Term Goal 5 - Progress (Week 1): Met Week 2:  PT Short Term Goal 1 (Week 2): =LTGs d/t ELOS  Skilled Therapeutic Interventions/Progress Updates:    pt received in bed and agreeable to therapy. Nursing present for meds pass, administered meds with pt EOB. Pt required min A HHA for supine>sit. Pt denied pain and using restroom at this time. ambulatory transfer to w/c with CGA. Pt required CGA and mod VC for anterior weight shift. Pt particpated in stari training 2 x 4 6" steps. Pt expressed intense fear when asked to utilize one hand rail only, especially on her L side. 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 Pt encouraged to attempt strength/coordination exercise (step up with CL leg to 2nd step), performed 2 sets of 5 BIL then reported she needed to have a bowel movement. Pt transported to room and performed ambulatory transfer to bathroom with RW. CGA for clothing management and min A for pericare for thoroughness. Hand hygiene with CGA. Pt opted to  remain in w/c at end of session, was left with all needs in reach and alarm active.   Therapy Documentation Precautions:  Precautions Precautions: Fall Precaution Comments: Posterior lean in standing Restrictions Weight Bearing Restrictions: No   Therapy/Group: Individual Therapy  Mickel Fuchs 08/20/2020, 8:43 AM

## 2020-08-20 NOTE — Progress Notes (Signed)
PROGRESS NOTE   Subjective/Complaints:  Ate 25-80% meals yesterday  Last BM yesterday    ROS:  Pt denies SOB, abd pain, CP, N/V/C/D, and vision changes    Objective:   No results found. Recent Labs    08/19/20 0633  WBC 7.4  HGB 11.5*  HCT 35.0*  PLT 275     Recent Labs    08/19/20 0633  NA 136  K 4.1  CL 107  CO2 23  GLUCOSE 91  BUN 23  CREATININE 0.81  CALCIUM 9.0      Intake/Output Summary (Last 24 hours) at 08/20/2020 0920 Last data filed at 08/19/2020 1809 Gross per 24 hour  Intake 118 ml  Output --  Net 118 ml      Pressure Injury 08/02/20 Buttocks Left Stage 1 -  Intact skin with non-blanchable redness of a localized area usually over a bony prominence. (Active)  08/02/20 1845  Location: Buttocks  Location Orientation: Left  Staging: Stage 1 -  Intact skin with non-blanchable redness of a localized area usually over a bony prominence.  Wound Description (Comments):   Present on Admission: Yes    Physical Exam: Vital Signs Blood pressure 135/73, pulse 64, temperature 98.3 F (36.8 C), temperature source Oral, resp. rate 18, height 5\' 7"  (1.702 m), weight 53.3 kg, SpO2 100 %.   General: No acute distress Mood and affect are appropriate Heart: Regular rate and rhythm no rubs murmurs or extra sounds Lungs: Clear to auscultation, breathing unlabored, no rales or wheezes Abdomen: Positive bowel sounds, soft nontender to palpation, nondistended Extremities: No clubbing, cyanosis, or edema Skin: No evidence of breakdown, no evidence of rash   Musc: No edema in extremities.  No tenderness in extremities. Motor: Bilateral upper extremities: Grossly 4+/5 throughout, unchanged Bilateral lower extremities: Grossly hip flexion, knee extension 4/5, ankle dorsiflexion 4+/5  Assessment/Plan: 1. Functional deficits which require 3+ hours per day of interdisciplinary therapy in a comprehensive  inpatient rehab setting. Physiatrist is providing close team supervision and 24 hour management of active medical problems listed below. Physiatrist and rehab team continue to assess barriers to discharge/monitor patient progress toward functional and medical goals  Care Tool:  Bathing    Body parts bathed by patient: Right arm, Left arm   Body parts bathed by helper: Buttocks     Bathing assist Assist Level: Moderate Assistance - Patient 50 - 74%     Upper Body Dressing/Undressing Upper body dressing   What is the patient wearing?: Button up shirt    Upper body assist Assist Level: Supervision/Verbal cueing    Lower Body Dressing/Undressing Lower body dressing      What is the patient wearing?: Incontinence brief, Pants     Lower body assist Assist for lower body dressing: 2 Helpers     Toileting Toileting    Toileting assist Assist for toileting: Contact Guard/Touching assist     Transfers Chair/bed transfer  Transfers assist     Chair/bed transfer assist level: Minimal Assistance - Patient > 75% Chair/bed transfer assistive device: Walker, Armrests   Locomotion Ambulation   Ambulation assist   Ambulation activity did not occur: Safety/medical concerns  Assist level: Minimal  Assistance - Patient > 75% Assistive device: Walker-rolling Max distance: 60ft   Walk 10 feet activity   Assist  Walk 10 feet activity did not occur: Safety/medical concerns  Assist level: Minimal Assistance - Patient > 75% Assistive device: Walker-rolling   Walk 50 feet activity   Assist Walk 50 feet with 2 turns activity did not occur: Safety/medical concerns  Assist level: Minimal Assistance - Patient > 75% Assistive device: Walker-rolling    Walk 150 feet activity   Assist Walk 150 feet activity did not occur: Safety/medical concerns         Walk 10 feet on uneven surface  activity   Assist Walk 10 feet on uneven surfaces activity did not occur:  Safety/medical concerns         Wheelchair     Assist Will patient use wheelchair at discharge?:  (yes per PT LTG) Type of Wheelchair: (P) Manual    Wheelchair assist level: (P) Total Assistance - Patient < 25% Max wheelchair distance: 50    Wheelchair 50 feet with 2 turns activity    Assist        Assist Level: Moderate Assistance - Patient 50 - 74%   Wheelchair 150 feet activity     Assist      Assist Level: Maximal Assistance - Patient 25 - 49%   Blood pressure 135/73, pulse 64, temperature 98.3 F (36.8 C), temperature source Oral, resp. rate 18, height 5\' 7"  (1.702 m), weight 53.3 kg, SpO2 100 %.  Medical Problem List and Plan: 1.  Functional deficits secondary to debility, gait disorder and failure to thrive   Con't CIR PT, OT and SLP 2.  Antithrombotics: -DVT/anticoagulation:  Pharmaceutical: Lovenox             -antiplatelet therapy: N/A 3. Pain Management: Tylenol prn.   8/15- denies pain- con't prn regimen 4. Mood: Team to provide ego support. LCSW to follow for evaluation and support.              -antipsychotic agents: N/A 5. Neuropsych: This patient is capable of making decisions on her own behalf. 6. Skin/Wound Care: Routine pressure relief measures.  7. Fluids/Electrolytes/Protein deficient malnutrition: Monitor I/O. --Continue Ensure TID to help with malnutrition due to FTT. -continue megace which has helped  8/15- meets criteria for moderate malnutrition- ate 25% of breakfast- will increase Megace to 800 mg daily 8. SVT: HR with metoprolol 12.5 mg TID             --patient/family educated on  use of Vagal maneuvers to reset to NSR.  Mild brady on low dose metoprolol  9. Severe recurrent depression: Stable on prozac 10 mg daily.   8/8- per psychiatry, pt had memory issues with higher dose  Increased Prozac to 20 mg daily and for anxiety, Buspar 5 mg TID not Vistaril  8/15- increased Prozac to 30 mg daily 11. Vitamin B 12 deficiency:  On  oral supplement. 12. Frequency/Incontinence: Improving?.              -monitor voiding patterns 13. HTN: Monitor BP tid--controlled on metoprolol.    Vitals:   08/20/20 0620 08/20/20 0758  BP: 135/73   Pulse: (!) 58 64  Resp: 18   Temp: 98.3 F (36.8 C)   SpO2: 100%    Controlled 8/16 14. Constipation:   -senokot-s at bedtime with daily miralax for now  Improved 15. Shuffling gait? Parkinsonism?  Started low dose Sinemet- 10/100 mg TID on 8/10, consider further increase  if necessary  8/15- increased to 2 pills of 10/100 mg TID- will monitor closely.  16.  Hypoalbuminemia  Supplement initiated on 8/13 17. Dispo  8/15- have discussed pt with daughter and the plan    LOS: 11 days A FACE TO FACE EVALUATION WAS PERFORMED  Erick Colace 08/20/2020, 9:20 AM

## 2020-08-20 NOTE — Plan of Care (Signed)
  Problem: Consults Goal: RH GENERAL PATIENT EDUCATION Description: See Patient Education module for education specifics. Outcome: Progressing   Problem: RH SAFETY Goal: RH STG ADHERE TO SAFETY PRECAUTIONS W/ASSISTANCE/DEVICE Description: STG Adhere to Safety Precautions With cues and reminders Outcome: Progressing   Problem: RH PAIN MANAGEMENT Goal: RH STG PAIN MANAGED AT OR BELOW PT'S PAIN GOAL Description: Pain level less than 4 on scale of 0-10 Outcome: Progressing   Problem: RH BOWEL ELIMINATION Goal: RH STG MANAGE BOWEL WITH ASSISTANCE Description: STG Manage Bowel with mod I Assistance. Outcome: Progressing   Problem: RH BLADDER ELIMINATION Goal: RH STG MANAGE BLADDER WITH ASSISTANCE Description: STG Manage Bladder With mod I Assistance Outcome: Progressing   

## 2020-08-20 NOTE — Patient Care Conference (Signed)
Inpatient RehabilitationTeam Conference and Plan of Care Update Date: 08/20/2020   Time: 11:32 AM    Patient Name: Sheila Andrade      Medical Record Number: [id number]  Date of Birth: 03/04/1935 Sex: Female         Room/Bed: 4W07C/4W07C-01 Payor Info: Payor: [employer] MEDICARE / Plan: [employer] MEDICARE CHOICE PPO / Product Type: *No Product type* /    Admit Date/Time:  08/09/2020  2:20 PM  Primary Diagnosis:  Debility  Hospital Problems: Principal Problem:   Debility Active Problems:   Hypoalbuminemia due to protein-calorie malnutrition (HCC)   Essential hypertension   History of supraventricular tachycardia   Shuffling gait   Malnutrition of moderate degree    Expected Discharge Date: Expected Discharge Date: 08/23/20  Team Members Present: Physician leading conference: Dr. Faith Rogue Social Worker Present: Dossie Der, LCSW Nurse Present: Kennyth Arnold, RN PT Present: Bernie Covey, PT OT Present: Earleen Newport, OT SLP Present: Colin Benton, SLP PPS Coordinator present : Fae Pippin, SLP     Current Status/Progress Goal Weekly Team Focus  Bowel/Bladder   Continent of B& B, LBM 08/12  regain management of bowel/bladder      Swallow/Nutrition/ Hydration             ADL's   has not wanting to bathe, planning shower tomorrow. Mod A shower last time, Mod A LB dress, CGA toileting but Min for posterior if has BM, shuffling/parkinsonian  Min toileting and LB ADLs, CGA transfers  ADl transfers, standing balance, taking larger steps for ADL transfers, global endurance, big move   Mobility   min-supervision at times STS, gait up to 200 ft with RW and CGA, mod VC for incr step length and heel strike, CGA for stairs with BUE support  CGA gait, supervision w/c mobility, mod stairs  Functional mobility, fear, balance, LE strength gait pattern   Communication             Safety/Cognition/ Behavioral Observations  Min-Supervision A, needs encouragement  Mod I - Supervision A   problem solving, recall and education   Pain   without complaint of pain  < 3  monitor pain q 4 hours and PRN   Skin   CDI  remain free of breakdown  assess skin shift & PRN     Discharge Planning:  Home with son and daughter in-law who has been here for therapies. Discussing OP versus Home Health therapies   Team Discussion: Started Sinemet, eating more, Parkinsonian features. Continent B/B, no reports of pain. Afraid of falling in shower, mod assist. Mod assist lower body, Contact guard transfers. Min assist goals with lower body. Min assist/supervision to stand. Ambulating 200 ft with RW, needs lots of cueing. Did stairs and expressed fears of falling. SLP reports gives up easily, needs cueing. Min assist to supervision currently. Patient on target to meet rehab goals: yes  *See Care Plan and progress notes for long and short-term goals.   Revisions to Treatment Plan:  MD adjusting medications.  Teaching Needs: Family education, medication management, transfer training, gait training, balance training, endurance training, stair training, safety awareness.  Current Barriers to Discharge: Decreased caregiver support, Medical stability, Home enviroment access/layout, Lack of/limited family support, Weight, and Medication compliance  Possible Resolutions to Barriers: Continue current medications, provide emotional support.     Medical Summary Current Status: sinemet trial for bradykinesias and shuffling gait. megace has helped with appetite but has still struggled at times.  Barriers to Discharge: Medical stability  Possible Resolutions to [employer]: daily assessment of labs and pt data, adjustment of PD meds. nutritional mgt   Continued Need for Acute Rehabilitation Level of Care: The patient requires daily medical management by a physician with specialized training in physical medicine and rehabilitation for the following reasons: Direction of a multidisciplinary  physical rehabilitation program to maximize functional independence : Yes Medical management of patient stability for increased activity during participation in an intensive rehabilitation regime.: Yes Analysis of laboratory values and/or radiology reports with any subsequent need for medication adjustment and/or medical intervention. : Yes   I attest that I was present, lead the team conference, and concur with the assessment and plan of the team.   Tennis Must 08/20/2020, 4:26 PM

## 2020-08-21 DIAGNOSIS — R2689 Other abnormalities of gait and mobility: Secondary | ICD-10-CM | POA: Diagnosis not present

## 2020-08-21 DIAGNOSIS — D62 Acute posthemorrhagic anemia: Secondary | ICD-10-CM

## 2020-08-21 DIAGNOSIS — I10 Essential (primary) hypertension: Secondary | ICD-10-CM | POA: Diagnosis not present

## 2020-08-21 DIAGNOSIS — Z8679 Personal history of other diseases of the circulatory system: Secondary | ICD-10-CM | POA: Diagnosis not present

## 2020-08-21 DIAGNOSIS — R5381 Other malaise: Secondary | ICD-10-CM | POA: Diagnosis not present

## 2020-08-21 NOTE — Progress Notes (Addendum)
Patient ID: Briana Farner, female   DOB: 09-27-1935, 85 y.o.   MRN: [id number]  Met with pt and daughter in-law-[name] to update team conference progress and continued plan for discharge 8/19. Both pleased with her continued progress. Discussed equipment needs, family interested in getting hospital bed aware fully electric has up charge. Have ordered via [employer] to deliver Thursday prior to discharge Friday. [name] had questions regarding change in medication and RN asked to address. Both daughter and daughter in-law have been checked off in PT, aware getting shower today with OT. Will look into ability to rent recliner/lift chair and get back with family. [employer] aware of discharge Friday.  1:30 PM Spoke with [name]-daughter in-law to inform her no place will rent recliner or lift chair. Will leave in room local places that have lift chairs to purchase. She is still awaiting [employer] to contact regarding delivery of hospital bed. Would like for it to be delivered tomorrow. Will check with [employer].

## 2020-08-21 NOTE — Progress Notes (Signed)
Speech Language Pathology Daily Session Note  Patient Details  Name: Sheila Andrade MRN: [id number] Date of Birth: 1935-11-29  Today's Date: 08/21/2020 SLP Individual Time: 5732-2567 SLP Individual Time Calculation (min): 35 min  Short Term Goals: Week 1: SLP Short Term Goal 1 (Week 1): Patient will demonstrate selective attention to functional tasks in mild-to-moderately distracting environment with supervision verbal cues for redirection SLP Short Term Goal 1 - Progress (Week 1): Met SLP Short Term Goal 2 (Week 1): Patient will complete mildly complex problem solving tasks with min A verbal cues SLP Short Term Goal 2 - Progress (Week 1): Met SLP Short Term Goal 3 (Week 1): Patient will recall new, daily information with sup A verbal cues with use of compensatory memory strategies SLP Short Term Goal 3 - Progress (Week 1): Met SLP Short Term Goal 4 (Week 1): Patient will complete functional medication management tasks with 90% accuracy and sup A verbal cues SLP Short Term Goal 4 - Progress (Week 1): Met  Skilled Therapeutic Interventions:   Patient seen for skilled ST session focusing on cognitive function goals. Patient awake and alert, sitting on edge of bed when SLP arrived. SLP was not able to engage her conversation and her responses to questions were very brief, requiring moderate cues to elaborate. Patient was oriented to month and year but when stating date she was incorrect (stated "18th") despite her independently looking at today's therapy schedule which has date on it. Plan continues to be discharge home with family on 8/19. SLP plans to reassess cognition next date.   Pain Pain Assessment Pain Scale: 0-10 Pain Score: 0-No pain  Therapy/Group: Individual Therapy  Sonia Baller, MA, CCC-SLP Speech Therapy

## 2020-08-21 NOTE — Progress Notes (Signed)
Physical Therapy Session Note  Patient Details  Name: Sheila Andrade MRN: [id number] Date of Birth: 08-30-1935  Today's Date: 08/21/2020 PT Individual Time: 4098-1191, 4782-9562 PT Individual Time Calculation (min): 45 min, 70 min  Short Term Goals: Week 1:  PT Short Term Goal 1 (Week 1): Pt with increase bed mobility to S. PT Short Term Goal 1 - Progress (Week 1): Met PT Short Term Goal 2 (Week 1): Pt wil increase transfers to min A. PT Short Term Goal 2 - Progress (Week 1): Met PT Short Term Goal 3 (Week 1): Pt will ambulate with LRAD about 100 feet with min A. PT Short Term Goal 3 - Progress (Week 1): Met PT Short Term Goal 4 (Week 1): Pt will ascend/descend 4 stairs with B rails and min A. PT Short Term Goal 4 - Progress (Week 1): Progressing toward goal PT Short Term Goal 5 (Week 1): Pt will propel w/c about 50 feet with S. PT Short Term Goal 5 - Progress (Week 1): Met Week 2:  PT Short Term Goal 1 (Week 2): =LTGs d/t ELOS  Skilled Therapeutic Interventions/Progress Updates:    AM session: pt received in bed and agreeable to therapy. No complaint of pain. Pt stated she was not doing well this AM but refused to explain stating, "it's a long story." Confirmed that pt was not in pain or feeling un well physically before continuing session. Pt asked to use the bathroom and had continent bladder and bowel void. CGA for clothing management and min A for thoroughness with hygiene. Hand hygiene with supervision at sink. Pt with noted incr bradykinesia and fear avoidance this AM.  Sit to stand with min A to CGA at times with HHA and RW. Pt propelled w/c with BUE x 50 ft with decr speed and difficulty navigating away from wall. Pt participated in limits of stability training on biodex, with most difficulty in posterior and posterior lateral directions. Somewhat improved with repetition and cues to shift from hips not from trunk. Pt then ambulated x 50 ft with cues for long steps, pt demoed  shuffling that could be corrected with max cueing and encouragement. Could maintain incr stride length x 4-6 steps at a time. Pt returned to room and opted to sit EOB to await pending ST session. Pt was left with all needs in reach and alarm active.   PM session: Pt seated in w/c on arrival and agreeable to therapy. Marked improvement in mood with bright affect and louder voice greeting therapist.   Gait training 3 x 200 ft with CGA fading to supervision at times and RW. Pt able to maintain incr stride length without cue x 180 ft on 2nd bout. Only occ single shuffling step throughout bouts.   Step ups on 6" step 2 x 10 for coordination and LE strength. Pt demoed kyphotic posture and difficulty maintain upright posture after correcting. Stair training 2 x 8 6 " steps with BIL handrails. Pt demoed improved confidence and stability from previous sessions.  Gait training with agility ladder, 1 foot to each square to encourage longer stride length and incr stance time. HHA with facilitation for weight shift. Gait 2 x 40 ft with mod carryover of incr stride length with RW and min facilitation.   Pt returned to room and requested to use bathroom. Supervision for clothing management and hygiene after continent urine void. Pt returned to bed after session, doffing shoes mod I and bed mobility with supervision. Pt was left with all  needs in reach and alarm active.   Therapy Documentation Precautions:  Precautions Precautions: Fall Precaution Comments: Posterior lean in standing Restrictions Weight Bearing Restrictions: No General:     Therapy/Group: Individual Therapy  Sheila Andrade 08/21/2020, 8:10 AM

## 2020-08-21 NOTE — Consult Note (Signed)
Psychiatry Consultation Note  Sheila Andrade was seen by our team and had her Prozac titrated up to 30 mg, so we will not be making any medication changes at this time. She is scheduled to be discharged on 8/19, so we recommend that she follow-up outpatient.  Psychiatry will sign off. Please do not hesitate to call back if questions arise. Thank you for this consult.    Lamar Sprinkles, MD PGY-1 08/21/2020 [facility] Department of Psychiatry

## 2020-08-21 NOTE — Progress Notes (Signed)
Occupational Therapy Session Note  Patient Details  Name: Sheila Andrade MRN: [id number] Date of Birth: 1935-01-22  Today's Date: 08/21/2020 OT Individual Time: 1100-1155 OT Individual Time Calculation (min): 55 min    Short Term Goals: Week 1:  OT Short Term Goal 1 (Week 1): Pt will complete 1/3 components of toileting with CGA for balance OT Short Term Goal 1 - Progress (Week 1): Met OT Short Term Goal 2 (Week 1): Pt will complete 2 grooming tasks while standing in front of the sink to increase standing endurance OT Short Term Goal 2 - Progress (Week 1): Met OT Short Term Goal 3 (Week 1): Pt will complete LB dressing with Mod A using adaptive techniques/AE as needed OT Short Term Goal 3 - Progress (Week 1): Progressing toward goal OT Short Term Goal 4 - Progress (Week 1): Progressing toward goal Week 2:  OT Short Term Goal 1 (Week 2): STGs = LTGs d/t ELOS   Skilled Therapeutic Interventions/Progress Updates:    Pt greeted at time of session sitting up in wheelchair agreeable to OT session, daughter in law present and remained throughout session, providing hands on care/training. Therapist providing Supervision but DIL providing care. Pt ambulated wheelchair > toilet w/ BSC over top CGA and 3/3 toileting CGA urine only. Walked short distance to shower bench same manner after toileting. Doffed clothing seated on bench and standing with grab bar. Performed UB/LB bathing CGA/Min overall for thoroughness. Dried off and dressed sitting on edge of bench with set up for UB and LB brief and pants Mod A. DIL donned shoes for time prior to pt walking to wheelchair CGA all with RW. Transported to tub shower room to demonstrate for family use of TTB and showed pictures online, verbalized understanding. Back in room set up alarm on call bell in reach.   Therapy Documentation Precautions:  Precautions Precautions: Fall Precaution Comments: Posterior lean in standing Restrictions Weight Bearing  Restrictions: No     Therapy/Group: Individual Therapy  [name] 08/21/2020, 7:18 AM

## 2020-08-21 NOTE — Progress Notes (Signed)
PROGRESS NOTE   Subjective/Complaints: Patient seen working with therapy this morning.  She states she slept well overnight.  She denies complaints.  ROS: Denies CP, SOB, N/V/D  Objective:   No results found. Recent Labs    08/19/20 0633  WBC 7.4  HGB 11.5*  HCT 35.0*  PLT 275     Recent Labs    08/19/20 0633  NA 136  K 4.1  CL 107  CO2 23  GLUCOSE 91  BUN 23  CREATININE 0.81  CALCIUM 9.0      Intake/Output Summary (Last 24 hours) at 08/21/2020 1039 Last data filed at 08/20/2020 2100 Gross per 24 hour  Intake 359 ml  Output --  Net 359 ml      Pressure Injury 08/02/20 Buttocks Left Stage 1 -  Intact skin with non-blanchable redness of a localized area usually over a bony prominence. (Active)  08/02/20 1845  Location: Buttocks  Location Orientation: Left  Staging: Stage 1 -  Intact skin with non-blanchable redness of a localized area usually over a bony prominence.  Wound Description (Comments):   Present on Admission: Yes    Physical Exam: Vital Signs Blood pressure (!) 146/80, pulse 61, temperature 97.7 F (36.5 C), temperature source Oral, resp. rate 16, height 5\' 7"  (1.702 m), weight 52.6 kg, SpO2 100 %. Constitutional: No distress . Vital signs reviewed. HENT: Normocephalic.  Atraumatic. Eyes: EOMI. No discharge. Cardiovascular: No JVD.  RRR. Respiratory: Normal effort.  No stridor.  Bilateral clear to auscultation. GI: Non-distended.  BS +. Skin: Warm and dry.  Intact. Psych: Normal mood.  Normal behavior. Musc: No edema in extremities.  No tenderness in extremities. Neuro: Alert Motor: Bilateral upper extremities: Grossly 4+/5 throughout, grossly unchanged Bilateral lower extremities: Grossly hip flexion, knee extension 4/5, ankle dorsiflexion 4+/5  Assessment/Plan: 1. Functional deficits which require 3+ hours per day of interdisciplinary therapy in a comprehensive inpatient rehab  setting. Physiatrist is providing close team supervision and 24 hour management of active medical problems listed below. Physiatrist and rehab team continue to assess barriers to discharge/monitor patient progress toward functional and medical goals  Care Tool:  Bathing    Body parts bathed by patient: Right arm, Left arm   Body parts bathed by helper: Buttocks     Bathing assist Assist Level: Moderate Assistance - Patient 50 - 74%     Upper Body Dressing/Undressing Upper body dressing   What is the patient wearing?: Button up shirt    Upper body assist Assist Level: Supervision/Verbal cueing    Lower Body Dressing/Undressing Lower body dressing      What is the patient wearing?: Incontinence brief, Pants     Lower body assist Assist for lower body dressing: 2 Helpers     Toileting Toileting    Toileting assist Assist for toileting: Contact Guard/Touching assist     Transfers Chair/bed transfer  Transfers assist     Chair/bed transfer assist level: Contact Guard/Touching assist Chair/bed transfer assistive device: Walker, Armrests   Locomotion Ambulation   Ambulation assist   Ambulation activity did not occur: Safety/medical concerns  Assist level: Contact Guard/Touching assist Assistive device: Walker-rolling Max distance: 170   Walk  10 feet activity   Assist  Walk 10 feet activity did not occur: Safety/medical concerns  Assist level: Contact Guard/Touching assist Assistive device: Walker-rolling   Walk 50 feet activity   Assist Walk 50 feet with 2 turns activity did not occur: Safety/medical concerns  Assist level: Contact Guard/Touching assist Assistive device: Walker-rolling    Walk 150 feet activity   Assist Walk 150 feet activity did not occur: Safety/medical concerns  Assist level: Contact Guard/Touching assist Assistive device: Walker-rolling    Walk 10 feet on uneven surface  activity   Assist Walk 10 feet on uneven  surfaces activity did not occur: Safety/medical concerns         Wheelchair     Assist Will patient use wheelchair at discharge?:  (yes per PT LTG) Type of Wheelchair: (P) Manual    Wheelchair assist level: (P) Total Assistance - Patient < 25% Max wheelchair distance: 50    Wheelchair 50 feet with 2 turns activity    Assist        Assist Level: Moderate Assistance - Patient 50 - 74%   Wheelchair 150 feet activity     Assist      Assist Level: Maximal Assistance - Patient 25 - 49%   Blood pressure (!) 146/80, pulse 61, temperature 97.7 F (36.5 C), temperature source Oral, resp. rate 16, height 5\' 7"  (1.702 m), weight 52.6 kg, SpO2 100 %.  Medical Problem List and Plan: 1.  Functional deficits secondary to debility, gait disorder and failure to thrive   Continue CIR 2.  Antithrombotics: -DVT/anticoagulation:  Pharmaceutical: Lovenox             -antiplatelet therapy: N/A 3. Pain Management: Tylenol prn.   8/15- denies pain- con't prn regimen 4. Mood: Team to provide ego support. LCSW to follow for evaluation and support.              -antipsychotic agents: N/A 5. Neuropsych: This patient is capable of making decisions on her own behalf. 6. Skin/Wound Care: Routine pressure relief measures.  7. Fluids/Electrolytes/Protein deficient malnutrition: Monitor I/O. --Continue Ensure TID to help with malnutrition due to FTT. -continue megace which has helped  8/15- meets criteria for moderate malnutrition- ate 25% of breakfast-increased Megace to 800 mg daily 8. SVT: HR with metoprolol 12.5 mg TID             --patient/family educated on  use of Vagal maneuvers to reset to NSR.  Soft on 8/17 9. Severe recurrent depression: Stable on prozac 10 mg daily.   8/8- per psychiatry, pt had memory issues with higher dose  Increased Prozac to 20 mg daily and for anxiety, Buspar 5 mg TID not Vistaril  8/15- increased Prozac to 30 mg daily 11. Vitamin B 12 deficiency: On   oral supplement. 12. Frequency/Incontinence: Improving?.              -monitor voiding patterns 13. HTN: Monitor BP tid--controlled on metoprolol.    Vitals:   08/20/20 2043 08/21/20 0504  BP: 123/67 (!) 146/80  Pulse: 70 61  Resp: 18 16  Temp: 98.2 F (36.8 C) 97.7 F (36.5 C)  SpO2: 96% 100%  Relatively controlled on 8/17 14. Constipation:   -senokot-s at bedtime with daily miralax for now  Improved 15. Shuffling gait? Parkinsonism?  Started low dose Sinemet- 10/100 mg TID on 8/10, consider further increase if necessary  8/15- increased to 2 pills of 10/100 mg TID- will monitor closely.  16.  Hypoalbuminemia  Supplement  initiated on 8/13 17.  Acute blood loss anemia  Hemoglobin 11.5 on 8/15, improving  LOS: 12 days A FACE TO FACE EVALUATION WAS PERFORMED  Lequisha Cammack Karis Juba 08/21/2020, 10:39 AM

## 2020-08-21 NOTE — Progress Notes (Signed)
Occupational Therapy Discharge Summary  Patient Details  Name: Sheila Andrade MRN: [id number] Date of Birth: [birth year]     Patient has met 7 of 8 long term goals due to improved activity tolerance, improved balance, postural control, ability to compensate for deficits, improved awareness, and improved coordination.  Patient to discharge at overall CGA/Min Assist level.  Patient's care partner is independent to provide the necessary physical and cognitive assistance at discharge.  Pt is overall CGA with ambulatory transfers with RW, Min-Mod A for LB dressing, CGA for toileting except occasional assist after BM, and CGA/Min for shower level bathing. Pt is limited by bradykinesia and parkinsonian features, very fearful of falling. Family is very supportive and daughter in law and daughter have completed family training.   Reasons goals not met: Pt needs Mod A with LB dressing to thread BLEs.   Recommendation:  Patient will benefit from ongoing skilled OT services in home health setting to continue to advance functional skills in the area of BADL and Reduce care partner burden.  Equipment: TTB, already has BSC  Reasons for discharge: treatment goals met and discharge from hospital  Patient/family agrees with progress made and goals achieved: Yes  OT Discharge Precautions/Restrictions  Precautions Precautions: Fall Precaution Comments: posterior bais, bradykinesia, fear of falling Restrictions Weight Bearing Restrictions: No Pain Pain Assessment Pain Scale: 0-10 Pain Score: 0-No pain ADL ADL Eating: Set up (per staff) Grooming: Setup Where Assessed-Grooming: Edge of bed Upper Body Bathing: Supervision/safety Where Assessed-Upper Body Bathing: Shower Lower Body Bathing: Minimal assistance Where Assessed-Lower Body Bathing: Shower Upper Body Dressing: Supervision/safety Where Assessed-Upper Body Dressing: Edge of bed Lower Body Dressing: Moderate assistance Where Assessed-Lower  Body Dressing: Edge of bed Toileting: Contact guard Where Assessed-Toileting: Bedside Commode Toilet Transfer: Therapist, music Method: Counselling psychologist: Bedside commode Tub/Shower Transfer: Contact guard Vision Baseline Vision/History: Glaucoma Patient Visual Report: No change from baseline Perception  Perception: Within Functional Limits Praxis Praxis: Impaired Praxis Impairment Details: Motor planning Cognition Overall Cognitive Status: Impaired/Different from baseline Arousal/Alertness: Awake/alert Orientation Level: Oriented X4 Memory: Impaired Problem Solving: Impaired Safety/Judgment: Appears intact Sensation Coordination Gross Motor Movements are Fluid and Coordinated: No Fine Motor Movements are Fluid and Coordinated: No Coordination and Movement Description: Parkinsonian movement patterns- bradykinesic, tremulous, and shuffling steps Motor  Motor Motor: Abnormal postural alignment and control Motor - Skilled Clinical Observations: Parkinsonian presentation with posterior bias in standing, deficits with motor planning but improved with multimodal cues Mobility  Transfers Sit to Stand: Contact Guard/Touching assist Stand to Sit: Contact Guard/Touching assist  Trunk/Postural Assessment  Cervical Assessment Cervical Assessment: Exceptions to [facility] Thoracic Assessment Thoracic Assessment: Exceptions to [facility] Lumbar Assessment Lumbar Assessment: Exceptions to [facility] Postural Control Postural Control: Deficits on evaluation Head Control: somewhat flexed posture, downward gaze Righting Reactions: very slow with posterior bias (improved from eval) Postural Limitations: kyphotic  Balance Balance Balance Assessed: Yes Static Sitting Balance Static Sitting - Balance Support: Bilateral upper extremity supported;Feet supported Static Sitting - Level of Assistance: 6: Modified independent (Device/Increase time) Dynamic Sitting Balance Dynamic  Sitting - Balance Support: Feet supported Dynamic Sitting - Level of Assistance: 5: Stand by assistance Dynamic Sitting - Balance Activities: Lateral lean/weight shifting;Forward lean/weight shifting;Reaching for objects Static Standing Balance Static Standing - Balance Support: Bilateral upper extremity supported;During functional activity Static Standing - Level of Assistance: 5: Stand by assistance Dynamic Standing Balance Dynamic Standing - Balance Support: During functional activity Dynamic Standing - Level of Assistance: 5: Stand by assistance Dynamic Standing -  Balance Activities: Lateral lean/weight shifting;Forward lean/weight shifting;Reaching for objects Extremity/Trunk Assessment RUE Assessment RUE Assessment: Exceptions to [facility] Active Range of Motion (AROM) Comments: Shoulder ROM limited to ~90 degrees (pt reports old RTC injury) LUE Assessment LUE Assessment: Exceptions to [facility] Active Range of Motion (AROM) Comments: Shoulder ROM limited to <90 degrees, hx RTC injury   [name] 08/21/2020, 12:34 PM

## 2020-08-22 DIAGNOSIS — R5381 Other malaise: Secondary | ICD-10-CM | POA: Diagnosis not present

## 2020-08-22 MED ORDER — FLUOXETINE HCL 10 MG PO CAPS: 30.0000 mg | ORAL_CAPSULE | Freq: Every day | ORAL | 3 refills | Status: AC

## 2020-08-22 MED ORDER — CARBIDOPA-LEVODOPA 10-100 MG PO TABS: 2.0000 | ORAL_TABLET | Freq: Three times a day (TID) | ORAL | 0 refills | Status: AC

## 2020-08-22 MED ORDER — METOPROLOL TARTRATE 25 MG PO TABS: 25.0000 mg | ORAL_TABLET | Freq: Two times a day (BID) | ORAL | 0 refills | Status: AC

## 2020-08-22 MED ORDER — BUSPIRONE HCL 5 MG PO TABS: 5.0000 mg | ORAL_TABLET | Freq: Three times a day (TID) | ORAL | 0 refills | Status: AC

## 2020-08-22 MED ORDER — SIMVASTATIN 20 MG PO TABS: 20.0000 mg | ORAL_TABLET | Freq: Every day | ORAL | 0 refills | Status: AC

## 2020-08-22 MED ORDER — MEGESTROL ACETATE 400 MG/10ML PO SUSP: 800.0000 mg | Freq: Every day | ORAL | 0 refills | Status: AC

## 2020-08-22 MED ORDER — VITAMIN D3 25 MCG (1000 UNIT) PO TABS: 1000.0000 [IU] | ORAL_TABLET | Freq: Every day | ORAL | 0 refills | Status: AC

## 2020-08-22 MED ORDER — TRAZODONE HCL 50 MG PO TABS: 25.0000 mg | ORAL_TABLET | Freq: Every day | ORAL | 0 refills | Status: AC

## 2020-08-22 MED ORDER — OMEPRAZOLE 40 MG PO CPDR: 40.0000 mg | DELAYED_RELEASE_CAPSULE | Freq: Every day | ORAL | 0 refills | Status: AC | PRN

## 2020-08-22 MED ORDER — ACETAMINOPHEN 325 MG PO TABS: 325.0000 mg | ORAL_TABLET | ORAL | Status: AC | PRN

## 2020-08-22 NOTE — Progress Notes (Signed)
Occupational Therapy Session Note  Patient Details  Name: Sheila Andrade MRN: [id number] Date of Birth: Jun 07, 1935  Today's Date: 08/22/2020 OT Individual Time: 7711-6579 OT Individual Time Calculation (min): 75 min    Short Term Goals: Week 2:  OT Short Term Goal 1 (Week 2): STGs = LTGs d/t ELOS  Skilled Therapeutic Interventions/Progress Updates: Pt greeted supine requesting to use BR, supervision for bed mobility CGA for sit<>stand from EOB with Rw, ambulatory transfer to toilet with RW and CGA. Pt completed toileting tasks with CGA but requested assist for posterior pericare d/t cleanliness. Pt completed standing hand hygiene at sink with CGA. Pt transported to therapy gym with total A for time mgmt. Pt completed  simulated posterior pericare therapeutic activity  where pt instructed pt reach posteriorly to retrieve clothespins positioned on pts back, CGA overall needed to complete task in standing.  Pt additionally completed various therapeutic activities to faciliate larger steps during functional mobility such as kicking ball in sitting,kicking cones in standing and stepping on numbered dots on floor. Pt completed tasks with overall CGA with RW. Pt completed functional mobility back to room with CGA with RW with chair follow. Pt left supine in bed with bed alarm activated and all needs within reach.   Therapy Documentation Precautions:  Precautions Precautions: Fall Precaution Comments: posterior bais, bradykinesia, fear of falling Restrictions Weight Bearing Restrictions: No    Pain: pt reports no pain during session.    Therapy/Group: Individual Therapy  Pollyann Glen [facility] 08/22/2020, 12:06 PM

## 2020-08-22 NOTE — Progress Notes (Signed)
PROGRESS NOTE   Subjective/Complaints:  Pt excited about going home , no c/os overnite except poor sleep (excited)  ROS: Denies CP, SOB, N/V/D  Objective:   No results found. No results for input(s): WBC, HGB, HCT, PLT in the last 72 hours.   No results for input(s): NA, K, CL, CO2, GLUCOSE, BUN, CREATININE, CALCIUM in the last 72 hours.    Intake/Output Summary (Last 24 hours) at 08/22/2020 0834 Last data filed at 08/21/2020 1900 Gross per 24 hour  Intake 236 ml  Output --  Net 236 ml      Pressure Injury 08/02/20 Buttocks Left Stage 1 -  Intact skin with non-blanchable redness of a localized area usually over a bony prominence. (Active)  08/02/20 1845  Location: Buttocks  Location Orientation: Left  Staging: Stage 1 -  Intact skin with non-blanchable redness of a localized area usually over a bony prominence.  Wound Description (Comments):   Present on Admission: Yes    Physical Exam: Vital Signs Blood pressure 136/62, pulse 62, temperature 97.9 F (36.6 C), temperature source Oral, resp. rate 18, height 5\' 7"  (1.702 m), weight 53.5 kg, SpO2 100 %.  General: No acute distress Mood and affect are appropriate Heart: Regular rate and rhythm no rubs murmurs or extra sounds Lungs: Clear to auscultation, breathing unlabored, no rales or wheezes Abdomen: Positive bowel sounds, soft nontender to palpation, nondistended Extremities: No clubbing, cyanosis, or edema Skin: No evidence of breakdown, no evidence of rash  Cerebellar exam normal finger to nose to finger as well as heel to shin in bilateral upper and lower extremities Musculoskeletal: Full range of motion in all 4 extremities. No joint swelling  Motor: Bilateral upper extremities: Grossly 4+/5 throughout, grossly unchanged Bilateral lower extremities: Grossly hip flexion, knee extension 4/5, ankle dorsiflexion 4+/5  Assessment/Plan: 1. Functional deficits  which require 3+ hours per day of interdisciplinary therapy in a comprehensive inpatient rehab setting. Physiatrist is providing close team supervision and 24 hour management of active medical problems listed below. Physiatrist and rehab team continue to assess barriers to discharge/monitor patient progress toward functional and medical goals  Care Tool:  Bathing    Body parts bathed by patient: Right arm, Left arm, Chest, Abdomen, Front perineal area, Buttocks, Right upper leg, Left upper leg, Right lower leg, Left lower leg, Face   Body parts bathed by helper: Buttocks     Bathing assist Assist Level: Contact Guard/Touching assist     Upper Body Dressing/Undressing Upper body dressing   What is the patient wearing?: Pull over shirt    Upper body assist Assist Level: Supervision/Verbal cueing    Lower Body Dressing/Undressing Lower body dressing      What is the patient wearing?: Incontinence brief, Pants     Lower body assist Assist for lower body dressing: Moderate Assistance - Patient 50 - 74%     Toileting Toileting    Toileting assist Assist for toileting: Contact Guard/Touching assist     Transfers Chair/bed transfer  Transfers assist     Chair/bed transfer assist level: Contact Guard/Touching assist Chair/bed transfer assistive device: Walker, Armrests   Locomotion Ambulation   Ambulation assist   Ambulation activity  did not occur: Safety/medical concerns  Assist level: Contact Guard/Touching assist Assistive device: Walker-rolling Max distance: 170   Walk 10 feet activity   Assist  Walk 10 feet activity did not occur: Safety/medical concerns  Assist level: Contact Guard/Touching assist Assistive device: Walker-rolling   Walk 50 feet activity   Assist Walk 50 feet with 2 turns activity did not occur: Safety/medical concerns  Assist level: Contact Guard/Touching assist Assistive device: Walker-rolling    Walk 150 feet  activity   Assist Walk 150 feet activity did not occur: Safety/medical concerns  Assist level: Contact Guard/Touching assist Assistive device: Walker-rolling    Walk 10 feet on uneven surface  activity   Assist Walk 10 feet on uneven surfaces activity did not occur: Safety/medical concerns         Wheelchair     Assist Will patient use wheelchair at discharge?:  (yes per PT LTG) Type of Wheelchair: (P) Manual    Wheelchair assist level: (P) Total Assistance - Patient < 25% Max wheelchair distance: 50    Wheelchair 50 feet with 2 turns activity    Assist        Assist Level: Moderate Assistance - Patient 50 - 74%   Wheelchair 150 feet activity     Assist      Assist Level: Maximal Assistance - Patient 25 - 49%   Blood pressure 136/62, pulse 62, temperature 97.9 F (36.6 C), temperature source Oral, resp. rate 18, height 5\' 7"  (1.702 m), weight 53.5 kg, SpO2 100 %.  Medical Problem List and Plan: 1.  Functional deficits secondary to debility, gait disorder and failure to thrive   Continue CIR, plan D/C 8/19 2.  Antithrombotics: -DVT/anticoagulation:  Pharmaceutical: Lovenox             -antiplatelet therapy: N/A 3. Pain Management: Tylenol prn.   8/15- denies pain- con't prn regimen 4. Mood: Team to provide ego support. LCSW to follow for evaluation and support.              -antipsychotic agents: N/A 5. Neuropsych: This patient is capable of making decisions on her own behalf. 6. Skin/Wound Care: Routine pressure relief measures.  7. Fluids/Electrolytes/Protein deficient malnutrition: Monitor I/O. --Continue Ensure TID to help with malnutrition due to FTT. -continue megace which has helped  8/15- meets criteria for moderate malnutrition- ate 25% of breakfast-increased Megace to 800 mg daily 8. SVT: HR with metoprolol 12.5 mg TID             --patient/family educated on  use of Vagal maneuvers to reset to NSR.   Vitals:   08/21/20 2014  08/22/20 0508  BP: (!) 104/56 136/62  Pulse: 70 62  Resp: 16 18  Temp: 98.4 F (36.9 C) 97.9 F (36.6 C)  SpO2: 100% 100%  HR controlled   9. Severe recurrent depression: Stable on prozac 10 mg daily.   8/8- per psychiatry, pt had memory issues with higher dose  Increased Prozac to 20 mg daily and for anxiety, Buspar 5 mg TID not Vistaril  8/15- increased Prozac to 30 mg daily 11. Vitamin B 12 deficiency: On  oral supplement. 12. Frequency/Incontinence: Improving?.              -monitor voiding patterns 13. HTN: Monitor BP tid--controlled on metoprolol.    Vitals:   08/21/20 2014 08/22/20 0508  BP: (!) 104/56 136/62  Pulse: 70 62  Resp: 16 18  Temp: 98.4 F (36.9 C) 97.9 F (36.6 C)  SpO2:  100% 100%  Relatively controlled on 8/18 14. Constipation:   -senokot-s at bedtime with daily miralax for now  Improved 15. Shuffling gait? Parkinsonism?  Started low dose Sinemet- 10/100 mg TID on 8/10, consider further increase if necessary  8/15- increased to 2 pills of 10/100 mg TID- will monitor closely.  16.  Hypoalbuminemia  Supplement initiated on 8/13 17.  Acute blood loss anemia  Hemoglobin 11.5 on 8/15, improving  LOS: 13 days A FACE TO FACE EVALUATION WAS PERFORMED  Erick Colace 08/22/2020, 8:34 AM

## 2020-08-22 NOTE — Progress Notes (Signed)
Physical Therapy Discharge Summary  Patient Details  Name: Sheila Andrade MRN: [id number] Date of Birth: Apr 15, 1935  Today's Date: 08/22/2020 PT Individual Time: 1045-1200 PT Individual Time Calculation (min): 75 min    Patient has met 12 of 12 long term goals due to improved activity tolerance, improved balance, improved postural control, increased strength, improved awareness, and improved coordination.  Patient to discharge at an ambulatory level [place].   Patient's care partner is independent to provide the necessary physical assistance at discharge. Sit to stand with supervision from surfaces with armrests, CGA from flat surface. Transfers with CGA to supervision at times. Gait with RW up to 200 ft with CGA-supervision at times. Pt continues to demonstrate parkinsonian symptoms, with some improvement in past few days. Up to 12 stairs with CGA and 2 hand rails. No formal family training performed d/t scheduling, but primary caregivers Stanton Kidney and Olegario Shearer were checked off for transfers earlier in stay and demoed appropriate guarding technique at that time.   Reasons goals not met: NA  Recommendation:  Patient will benefit from ongoing skilled PT services in outpatient setting to continue to advance safe functional mobility, address ongoing impairments in balance, gait deviations, parkinsonian symptoms, and minimize fall risk.  Equipment: RW  Reasons for discharge: treatment goals met and discharge from hospital  Patient/family agrees with progress made and goals achieved: Yes  Skilled PT intervention: Pt in bed on arrival and agreeable to session. No complaint of pain. Pt requested to use bathroom at start of session. ambulatory transfer to bathroom with RW and CGA. Clothing management with supervision. Pt was unable to void at this time. Pt ambulated to/from gym up to 200 ft throughout session with RW and CGA- supervision at times. Pt required fewer cues for long steps than previous  sessions. Sit to stand from surface with arm rest supervision to RW, from mat table with CGA. Pt participated in simulated car transfer on mat table because simulator was unavailable at time of session.   NMR including cone taps with BUE support and UUE support on RW with CGA for improved coordination, balance, and confidence. Pt then kicked cones x 12 ft to improve knee extension and heel strike with gait. Side steps 3 x 10 ft with CGA and VC for wide steps. Stair navigation with 6" steps x 12 with 2 hand rails. Pt then instructed on curb navigation x 2 with RW and CGA. Pt returned to room and performed bed mobility with supervision. Pt was left with all needs in reach and alarm active.   PT Discharge Precautions/Restrictions   Vital Signs   Pain Pain Assessment Pain Scale: 0-10 Pain Score: 0-No pain Vision/Perception     Cognition Orientation Level: Oriented X4 Sensation   Motor     Mobility   Locomotion     Trunk/Postural Assessment     Balance   Extremity Assessment             Mickel Fuchs 08/22/2020, 12:22 PM

## 2020-08-22 NOTE — Progress Notes (Signed)
Nutrition Follow-up  DOCUMENTATION CODES:   Non-severe (moderate) malnutrition in context of chronic illness  INTERVENTION:  Continue Ensure Enlive po TID, each supplement provides 350 kcal and 20 grams of protein  Continue 30 ml Prosource plus po BID, each supplement provides 100 kcal and 15 grams of protein.   NUTRITION DIAGNOSIS:   Moderate Malnutrition related to chronic illness as evidenced by moderate fat depletion, moderate muscle depletion; ongoing  GOAL:   Patient will meet greater than or equal to 90% of their needs; progressing  MONITOR:   PO intake, Supplement acceptance, Skin, Weight trends, Labs, I & O's  REASON FOR ASSESSMENT:   Malnutrition Screening Tool    ASSESSMENT:   85 year old female with history of HTN, pelvic fx 01/2020 with progressive decline complicated by Covid infection, anorexia with wt loss, severe recurrent depression, recurrent UTIs who was originally admitted to [facility] on 07/14/20 with falls, shuffling gait with parkinsonian type symptoms, SI due to severe depression and FTT. She was admitted to CIR on 07/29 due to functional deficits from debility.  Meal completion has been 25-100%. Pt has been tolerating her PO diet. Pt currently has Ensure ordered and has been consuming them. Prosource plus with more varied consumption. Plans for discharge tomorrow. Recommend continuation of nutritional supplements upon discharge to aid in caloric and protein needs.   Labs and medications reviewed.   Diet Order:   Diet Order             Diet regular Room service appropriate? Yes; Fluid consistency: Thin  Diet effective now                   EDUCATION NEEDS:   Not appropriate for education at this time  Skin:  Skin Assessment: Skin Integrity Issues: Skin Integrity Issues:: Stage I Stage I: buttocks  Last BM:  8/16  Height:   Ht Readings from Last 1 Encounters:  08/09/20 5\' 7"  (1.702 m)    Weight:   Wt Readings from Last 1 Encounters:   08/21/20 53.5 kg    BMI:  Body mass index is 18.47 kg/m.  Estimated Nutritional Needs:   Kcal:  1600-1800  Protein:  80-95 grams  Fluid:  > 1.6 L/day  08/23/20, MS, RD, LDN RD pager number/after hours weekend pager number on Amion.

## 2020-08-22 NOTE — Progress Notes (Signed)
Inpatient Rehabilitation Care Coordinator Discharge Note  The overall goal for the admission was met for:   Discharge location: [place]-24/7 CARE  Length of Stay: Yes-14 DAYS  Discharge activity level: Yes-CGA-MIN ASSIST LEVEL  Home/community participation: Yes  Services provided included: MD, RD, PT, OT, RN, CM, Pharmacy, and SW  Financial Services: Private Insurance: [employer] offered to/list presented to:PT AND DAUGHTER [place]  Follow-up services arranged: Home Health: [place], DME: [facility] [place] [facility], and Patient/Family request agency HH: [facility], DME: NO PREF  TO FOLLOW UP WITH OP PSYCHIATRY  Comments (or additional information):DAUGHTER AND DAUGHTER [place] WERE HERE DAILY AND CHECKED OFF ON AMBULATION AND CARE  Patient/Family verbalized understanding of follow-up arrangements: Yes  Individual responsible for coordination of the follow-up plan: MARY-DAUGHTER [phone]  [facility] [place] [phone]  Confirmed correct DME delivered: Elease Hashimoto 08/22/2020    Malynn Lucy, Gardiner Rhyme

## 2020-08-22 NOTE — Progress Notes (Signed)
Speech Language Pathology Discharge Summary  Patient Details  Name: Sheila Andrade MRN: [id number] Date of Birth: 1935/09/22  Today's Date: 08/22/2020 SLP Individual Time: 0175-1025 SLP Individual Time Calculation (min): 20 min   Skilled Therapeutic Interventions:  Patient seen in early AM for skilled ST session. When SLP arrived, patient in bed with breakfast tray in front of her. She had finished eating and initially seemed to be receptive to final ST session, however she then requested, "can we make it short?" And stated she was tired. During SLP guided discussion, patient had difficulty giving specific responses to questions. She did state that she felt "Im not ready" (to leave rehab) and that she needed to work on walking and being able to go to the bathroom on her own. SLP did end session early as patient requested given her limited participation overall.     Patient has met 4 of 5 long term goals.  Patient to discharge at overall Supervision;Min;Modified Independent level.  Reasons goals not met: Patient did not meet goal of supervision with complex problem solving and is at [facility] level   Clinical Impression/Discharge Summary: Patient met 4/5 LTG's related to cognitive functioning and is currently a modified independent level for recall, basic problem solving and safety awareness but still benefits from [place] for complex level problem solving, alternating attention, initiating, awareness to errors and ability to correct them. Patient also struggled with motivation to work on challenging cognitive tasks and during last few sessions, was less engaged or interested in participating. Affect was consistently flat. SLP is not recommending f/u as patient will have necessary assistance at home and SLP has previously discussed suggestions of cogntive tasks that daughter can work on with patient at home.  Care Partner:  Caregiver Able to Provide Assistance: Yes  Type of Caregiver Assistance:  Physical;Cognitive  Recommendation:  None      Equipment: None for speech   Reasons for discharge: Discharged from hospital;Treatment goals met   Patient/Family Agrees with Progress Made and Goals Achieved: Yes    Sonia Baller, MA, CCC-SLP Speech Therapy

## 2020-08-22 NOTE — Discharge Summary (Signed)
Physician Discharge Summary  Patient ID: Sheila Andrade MRN: [id number] DOB/AGE: 05/05/1935 85 y.o.  Admit date: 08/09/2020 Discharge date: 08/22/2020  Discharge Diagnoses:  Principal Problem:   Debility Active Problems:   Hypoalbuminemia due to protein-calorie malnutrition (HCC)   Essential hypertension   History of supraventricular tachycardia   Shuffling gait   Malnutrition of moderate degree   Acute blood loss anemia   Discharged Condition: stable  Significant Diagnostic Studies: No results found.  Labs:  Basic Metabolic Panel: BMP Latest Ref Rng & Units 08/19/2020 08/12/2020 08/06/2020  Glucose 70 - 99 mg/dL 91 92 -  BUN 8 - 23 mg/dL 23 71(I) -  Creatinine 0.44 - 1.00 mg/dL 9.67 8.93 -  BUN/Creat Ratio 12 - 28 - - -  Sodium 135 - 145 mmol/L 136 137 -  Potassium 3.5 - 5.1 mmol/L 4.1 4.0 4.2  Chloride 98 - 111 mmol/L 107 109 -  CO2 22 - 32 mmol/L 23 21(L) -  Calcium 8.9 - 10.3 mg/dL 9.0 9.1 -     CBC: CBC Latest Ref Rng & Units 08/19/2020 08/12/2020 08/05/2020  WBC 4.0 - 10.5 K/uL 7.4 7.9 6.9  Hemoglobin 12.0 - 15.0 g/dL 11.5(L) 10.8(L) 11.9(L)  Hematocrit 36.0 - 46.0 % 35.0(L) 34.6(L) 36.7  Platelets 150 - 400 K/uL 275 307 391     CBG: No results for input(s): GLUCAP in the last 168 hours.  Brief HPI:   Sheila Andrade is a 85 y.o. female with history of HTN, pelvic fracture 01/2020 with progressive decline complicated by COVID infection, anorexia with weight loss, severe recurrent depression, recurrent UTIs who was originally admitted to [facility] 07/15/2018 with falls, shuffling gait with parkinsonian type symptoms as well as suicidal ideation due to severe depression and failure to thrive.  Neurology and psychiatry was following patient on acute with work-up being negative.  Tremors and gait disorder felt to be from Abilify which was discontinued. Outpatient EMG nerve conduction study recommended to evaluate BLE weakness and Vitamin B12 was added for supplement due to low levels.  She was weaned from Effexor to Prozac for mood stabilization and Megace was added to help stimulate po intake.     She was admitted to CIR on 08/02/2020 due to functional deficits from debility.  On 08/02 she developed hypotension with asymptomatic SVT and was transferred to telemetry for monitoring and further work-up.  She was started on metoprolol which was titrated upwards for rate control.  Dr. Townsend Roger was following for input and has educated patient and family on use of vagal maneuvers for management of SVT prn recurrence.  Patient continued to exhibit weakness with decrease in balance, problems with mobility and difficulty with ADL tasks.  CIR was recommended due to functional decline   Hospital Course: Sheila Andrade was admitted to rehab 08/09/2020 for inpatient therapies to consist of PT, ST and OT at least three hours five days a week. Past admission physiatrist, therapy team and rehab RN have worked together to provide customized collaborative inpatient rehab.  Team has provided ego support during her stay.  Ensure supplements were added to help with malnutrition due to failure to thrive. Her p.o. intake has been slowly improving with increase in Megace to 800 mg daily.  Heart rate was monitored on 3 times daily basis and no recurrent SVT reported during her stay.  Her blood pressures were monitored on TID basis and has been stable. Protein supplements were added to help with hypoalbuminemia.  Follow-up CBC shows acute blood loss anemia is  improving and check of electrolytes shows that prerenal azotemia has resolved.  Family expressed concerns about patient's mood as well as subtherapeutic Prozac dose.  Psychiatry was consulted for input and initially recommended concerns about side effects.  Prozac has slowly been titrated to 30 mg daily without side effects and BuSpar was used on 3 times daily basis to help manage anxiety.  Constipation has improved with titration of bowel regimen.  Family  also expressed concerns about her parkinsonism and lack of treatment therefore Sinemet was added and titrated on 08/15 without side effects.  Further adjustment to be done on outpatient basis or per neurology.   She has had improvement in her overall functional status and is currently requires contact-guard assist to min assist with mobility.  She will continue to receive follow-up home health PT and OT by [facility] after discharge.   Rehab course: During patient's stay in rehab weekly team conferences were held to monitor patient's progress, set goals and discuss barriers to discharge. At admission, patient required mod assist with mobility and with ADL tasks. She exhibited mild cognitive linguistic impairments affecting STM, high-level problem-solving and executive functions. She  has had improvement in activity tolerance, balance, postural control as well as ability to compensate for deficits.  She requires min to mod assist for lower body dressing and contact-guard to min assist for bathing and shower level. She requires supervision to contact-guard assist for transfers and is able to ambulate up to 200 feet with contact-guard to supervision at times.  She requires min assist to climb 12 stairs. She requires supervision to min assist with complex cognitive tasks and progress has been limited by her inconsistent participation. Family has been present to provide ego support and encouragement during her stay.  Family has been educated on care.      Discharge disposition: 01-Home or Self Care  Diet: Regular/as tolerated.   Allergies as of 08/22/2020   No Known Allergies      Medication List     STOP taking these medications    feeding supplement Liqd   hydrOXYzine 25 MG tablet Commonly known as: ATARAX/VISTARIL   multivitamin with minerals Tabs tablet   nystatin powder Commonly known as: MYCOSTATIN/NYSTOP   ondansetron 4 MG disintegrating tablet Commonly known as: ZOFRAN-ODT        TAKE these medications    acetaminophen 325 MG tablet Commonly known as: TYLENOL Take 1-2 tablets (325-650 mg total) by mouth every 4 (four) hours as needed for mild pain. What changed:  medication strength how much to take when to take this reasons to take this   bimatoprost 0.01 % Soln Commonly known as: LUMIGAN Place 1 drop into the left eye at bedtime.   brinzolamide 1 % ophthalmic suspension Commonly known as: AZOPT Place 1 drop into the left eye 2 (two) times daily.   busPIRone 5 MG tablet Commonly known as: BUSPAR Take 1 tablet (5 mg total) by mouth 3 (three) times daily.   carbidopa-levodopa 10-100 MG tablet Commonly known as: SINEMET IR Take 2 tablets by mouth 3 (three) times daily.   cholecalciferol 25 MCG (1000 UNIT) tablet Commonly known as: VITAMIN D Take 1 tablet (1,000 Units total) by mouth daily.   FLUoxetine 10 MG capsule Commonly known as: PROZAC Take 3 capsules (30 mg total) by mouth daily. What changed: how much to take   megestrol 400 MG/10ML suspension Commonly known as: MEGACE Take 20 mLs (800 mg total) by mouth daily. What changed: how much  to take   metoprolol tartrate 25 MG tablet Commonly known as: LOPRESSOR Take 1 tablet (25 mg total) by mouth 2 (two) times daily. What changed:  how much to take when to take this   multivitamin with minerals tablet Take 1 tablet by mouth daily.   omeprazole 40 MG capsule Commonly known as: PRILOSEC Take 1 capsule (40 mg total) by mouth daily as needed (heartburn).   polyethylene glycol 17 g packet Commonly known as: MIRALAX / GLYCOLAX Take 17 g by mouth daily as needed for mild constipation.   simvastatin 20 MG tablet Commonly known as: ZOCOR Take 1 tablet (20 mg total) by mouth daily.   traZODone 50 MG tablet Commonly known as: DESYREL Take 0.5 tablets (25 mg total) by mouth at bedtime.        Follow-up Information     Lovorn, Aundra Millet, MD Follow up.   Specialty: Physical  Medicine and Rehabilitation Why: No formal follow-up needed Contact information: [address] [place] [address] [phone]         Rollene Rotunda, MD Follow up.   Specialty: Cardiology Why: Call for appointment Contact information: [address] [place] [address] [phone]                 Signed: Jacquelynn Cree 08/22/2020, 11:40 PM

## 2020-08-22 NOTE — Progress Notes (Signed)
Patient ID: Sheila Andrade, female   DOB: September 19, 1935, 85 y.o.   MRN: [id number] Told by [name]-daughter in-law pt will need a tub bench have made referral to [employer] to deliver to room prior to her discharge tomorrow. Aware of the private pay. Will also give a script for lift chair aware medicare may cover motor of lift chair once purchased. Preparing for discharge tomorrow.

## 2020-08-23 DIAGNOSIS — R5381 Other malaise: Secondary | ICD-10-CM | POA: Diagnosis not present

## 2020-08-23 NOTE — Progress Notes (Signed)
PROGRESS NOTE   Subjective/Complaints:  Daughter in room , has had d/c instructions  ROS: Denies CP, SOB, N/V/D  Objective:   No results found. No results for input(s): WBC, HGB, HCT, PLT in the last 72 hours.   No results for input(s): NA, K, CL, CO2, GLUCOSE, BUN, CREATININE, CALCIUM in the last 72 hours.    Intake/Output Summary (Last 24 hours) at 08/23/2020 0903 Last data filed at 08/22/2020 1700 Gross per 24 hour  Intake 320 ml  Output --  Net 320 ml      Pressure Injury 08/02/20 Buttocks Left Stage 1 -  Intact skin with non-blanchable redness of a localized area usually over a bony prominence. (Active)  08/02/20 1845  Location: Buttocks  Location Orientation: Left  Staging: Stage 1 -  Intact skin with non-blanchable redness of a localized area usually over a bony prominence.  Wound Description (Comments):   Present on Admission: Yes    Physical Exam: Vital Signs Blood pressure 122/68, pulse 65, temperature 98.6 F (37 C), temperature source Oral, resp. rate 16, height 5\' 7"  (1.702 m), weight 60.1 kg, SpO2 99 %.  General: No acute distress Mood and affect are appropriate Heart: Regular rate and rhythm no rubs murmurs or extra sounds Lungs: Clear to auscultation, breathing unlabored, no rales or wheezes Abdomen: Positive bowel sounds, soft nontender to palpation, nondistended Extremities: No clubbing, cyanosis, or edema Skin: No evidence of breakdown, no evidence of rash  Cerebellar exam normal finger to nose to finger as well as heel to shin in bilateral upper and lower extremities Musculoskeletal: Full range of motion in all 4 extremities. No joint swelling  Motor: Bilateral upper extremities: Grossly 4+/5 throughout, grossly unchanged Bilateral lower extremities: Grossly hip flexion, knee extension 4/5, ankle dorsiflexion 4+/5  Assessment/Plan: 1. Functional deficits  Stable for D/C today F/u PCP  in 3-4 weeks F/u PM&R 2 weeks See D/C summary See D/C instructions  Care Tool:  Bathing    Body parts bathed by patient: Right arm, Left arm, Chest, Abdomen, Front perineal area, Buttocks, Right upper leg, Left upper leg, Right lower leg, Left lower leg, Face   Body parts bathed by helper: Buttocks     Bathing assist Assist Level: Contact Guard/Touching assist     Upper Body Dressing/Undressing Upper body dressing   What is the patient wearing?: Pull over shirt    Upper body assist Assist Level: Supervision/Verbal cueing    Lower Body Dressing/Undressing Lower body dressing      What is the patient wearing?: Incontinence brief, Pants     Lower body assist Assist for lower body dressing: Moderate Assistance - Patient 50 - 74%     Toileting Toileting    Toileting assist Assist for toileting: Contact Guard/Touching assist     Transfers Chair/bed transfer  Transfers assist     Chair/bed transfer assist level: Contact Guard/Touching assist Chair/bed transfer assistive device:   Ambulation assist   Ambulation activity did not occur: Safety/medical concerns  Assist level: Supervision/Verbal cueing Assistive device: Walker-rolling Max distance: 200 ft   Walk 10 feet activity   Assist  Walk 10 feet activity did not occur:  Safety/medical concerns  Assist level: Supervision/Verbal cueing Assistive device: Walker-rolling   Walk 50 feet activity   Assist Walk 50 feet with 2 turns activity did not occur: Safety/medical concerns  Assist level: Supervision/Verbal cueing Assistive device: Walker-rolling    Walk 150 feet activity   Assist Walk 150 feet activity did not occur: Safety/medical concerns  Assist level: Supervision/Verbal cueing Assistive device: Walker-rolling    Walk 10 feet on uneven surface  activity   Assist Walk 10 feet on uneven surfaces activity did not occur:  (unable to assess, group in ortho  gym)   Assist level: Contact Guard/Touching assist Assistive device: Photographer Will patient use wheelchair at discharge?: No Type of Wheelchair: Manual    Wheelchair assist level: Supervision/Verbal cueing Max wheelchair distance: 50 ft    Wheelchair 50 feet with 2 turns activity    Assist        Assist Level: Supervision/Verbal cueing   Wheelchair 150 feet activity     Assist      Assist Level: Maximal Assistance - Patient 25 - 49%   Blood pressure 122/68, pulse 65, temperature 98.6 F (37 C), temperature source Oral, resp. rate 16, height 5\' 7"  (1.702 m), weight 60.1 kg, SpO2 99 %.  Medical Problem List and Plan: 1.  Functional deficits secondary to debility, gait disorder and failure to thrive   D/C 8/19 2.  Antithrombotics: -DVT/anticoagulation:  Pharmaceutical: Lovenox             -antiplatelet therapy: N/A 3. Pain Management: Tylenol prn.   8/15- denies pain- con't prn regimen 4. Mood: Team to provide ego support. LCSW to follow for evaluation and support.              -antipsychotic agents: N/A 5. Neuropsych: This patient is capable of making decisions on her own behalf. 6. Skin/Wound Care: Routine pressure relief measures.  7. Fluids/Electrolytes/Protein deficient malnutrition: Monitor I/O. --Continue Ensure TID to help with malnutrition due to FTT. -continue megace which has helped  8/15- meets criteria for moderate malnutrition- ate 25% of breakfast-increased Megace to 800 mg daily 8. SVT: HR with metoprolol 12.5 mg TID             --patient/family educated on  use of Vagal maneuvers to reset to NSR.   Vitals:   08/22/20 2058 08/23/20 0438  BP: (!) 103/59 122/68  Pulse: 67 65  Resp: 18 16  Temp: 98.4 F (36.9 C) 98.6 F (37 C)  SpO2: 99% 99%  HR controlled   9. Severe recurrent depression: Stable on prozac 10 mg daily.   8/8- per psychiatry, pt had memory issues with higher dose  Increased Prozac to 20  mg daily and for anxiety, Buspar 5 mg TID not Vistaril  8/15- increased Prozac to 30 mg daily 11. Vitamin B 12 deficiency: On  oral supplement. 12. Frequency/Incontinence: Improving?.              -monitor voiding patterns 13. HTN: Monitor BP tid--controlled on metoprolol.    Vitals:   08/22/20 2058 08/23/20 0438  BP: (!) 103/59 122/68  Pulse: 67 65  Resp: 18 16  Temp: 98.4 F (36.9 C) 98.6 F (37 C)  SpO2: 99% 99%  Relatively controlled on 8/19 14. Constipation:   -senokot-s at bedtime with daily miralax for now  Improved 15. Shuffling gait? Parkinsonism?  Started low dose Sinemet- 10/100 mg TID on 8/10, consider further increase if necessary  8/15- increased to  2 pills of 10/100 mg TID- will monitor closely.  16.  Hypoalbuminemia  Supplement initiated on 8/13 17.  Acute blood loss anemia  Hemoglobin 11.5 on 8/15, improving  LOS: 14 days A FACE TO FACE EVALUATION WAS PERFORMED  Erick Colace 08/23/2020, 9:03 AM

## 2020-08-29 ENCOUNTER — Telehealth: Payer: Self-pay | Admitting: *Deleted

## 2020-08-29 NOTE — Telephone Encounter (Signed)
Marcelino Duster OT called for POC 1wk4 2wk3.  Approval given.

## 2020-09-03 ENCOUNTER — Ambulatory Visit: Payer: Medicare PPO | Admitting: Physician Assistant

## 2020-09-19 ENCOUNTER — Emergency Department (HOSPITAL_COMMUNITY): Payer: Medicare PPO

## 2020-09-19 ENCOUNTER — Emergency Department (HOSPITAL_COMMUNITY)
Admission: EM | Admit: 2020-09-19 | Discharge: 2020-09-20 | Disposition: A | Discharge status: Home / Self Care | Payer: Medicare PPO | Attending: Emergency Medicine | Admitting: Emergency Medicine

## 2020-09-19 ENCOUNTER — Encounter (HOSPITAL_COMMUNITY): Payer: Self-pay

## 2020-09-19 ENCOUNTER — Other Ambulatory Visit: Payer: Self-pay

## 2020-09-19 DIAGNOSIS — R531 Weakness: Secondary | ICD-10-CM | POA: Diagnosis not present

## 2020-09-19 DIAGNOSIS — K5900 Constipation, unspecified: Secondary | ICD-10-CM | POA: Insufficient documentation

## 2020-09-19 DIAGNOSIS — I10 Essential (primary) hypertension: Secondary | ICD-10-CM | POA: Insufficient documentation

## 2020-09-19 DIAGNOSIS — Z79899 Other long term (current) drug therapy: Secondary | ICD-10-CM | POA: Diagnosis not present

## 2020-09-19 DIAGNOSIS — G20 Parkinson's disease: Secondary | ICD-10-CM | POA: Insufficient documentation

## 2020-09-19 DIAGNOSIS — R1084 Generalized abdominal pain: Secondary | ICD-10-CM

## 2020-09-19 LAB — CBC WITH DIFFERENTIAL/PLATELET
Abs Immature Granulocytes: 0.04 10*3/uL (ref 0.00–0.07)
Basophils Absolute: 0 10*3/uL (ref 0.0–0.1)
Basophils Relative: 0 %
Eosinophils Absolute: 0.1 10*3/uL (ref 0.0–0.5)
Eosinophils Relative: 1 %
HCT: 37.4 % (ref 36.0–46.0)
Hemoglobin: 11.9 g/dL — ABNORMAL LOW (ref 12.0–15.0)
Immature Granulocytes: 1 %
Lymphocytes Relative: 28 %
Lymphs Abs: 2 10*3/uL (ref 0.7–4.0)
MCH: 29.1 pg (ref 26.0–34.0)
MCHC: 31.8 g/dL (ref 30.0–36.0)
MCV: 91.4 fL (ref 80.0–100.0)
Monocytes Absolute: 0.5 10*3/uL (ref 0.1–1.0)
Monocytes Relative: 8 %
Neutro Abs: 4.5 10*3/uL (ref 1.7–7.7)
Neutrophils Relative %: 62 %
Platelets: 267 10*3/uL (ref 150–400)
RBC: 4.09 MIL/uL (ref 3.87–5.11)
RDW: 15.2 % (ref 11.5–15.5)
WBC: 7.2 10*3/uL (ref 4.0–10.5)
nRBC: 0 % (ref 0.0–0.2)

## 2020-09-19 LAB — COMPREHENSIVE METABOLIC PANEL
ALT: 8 U/L (ref 0–44)
AST: 21 U/L (ref 15–41)
Albumin: 3.8 g/dL (ref 3.5–5.0)
Alkaline Phosphatase: 55 U/L (ref 38–126)
Anion gap: 6 (ref 5–15)
BUN: 27 mg/dL — ABNORMAL HIGH (ref 8–23)
CO2: 23 mmol/L (ref 22–32)
Calcium: 9.4 mg/dL (ref 8.9–10.3)
Chloride: 109 mmol/L (ref 98–111)
Creatinine, Ser: 0.78 mg/dL (ref 0.44–1.00)
GFR, Estimated: >60 mL/min (ref >60)
Glucose, Bld: 121 mg/dL — ABNORMAL HIGH (ref 70–99)
Potassium: 3.8 mmol/L (ref 3.5–5.1)
Sodium: 138 mmol/L (ref 135–145)
Total Bilirubin: 0.4 mg/dL (ref 0.3–1.2)
Total Protein: 7 g/dL (ref 6.5–8.1)

## 2020-09-19 LAB — LIPASE, BLOOD: Lipase: 39 U/L (ref 11–51)

## 2020-09-19 IMAGING — CT CT ABD-PELV W/ CM
3 of 5 series · 16 of 46 positions shown, 18 images · IV contrast (omnipaque)
Comparison: CT abdomen pelvis [DATE]

CLINICAL DATA: Bowel obstruction suspected.

EXAM:
CT ABDOMEN AND PELVIS WITH CONTRAST
TECHNIQUE: Multidetector CT imaging of the abdomen and pelvis was performed
using the standard protocol following bolus administration of
intravenous contrast.
CONTRAST:  80mL OMNIPAQUE IOHEXOL 350 MG/ML SOLN

[Series 2: axial st · axial · 0.73mm/px · z∈[-480,-140]mm · 11 of 82 slices shown, 13 images]
[im 7/82  soft-tissue]
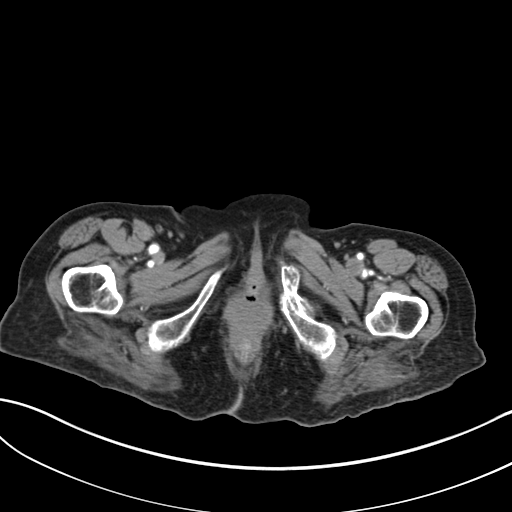
[im 7/82  bone]
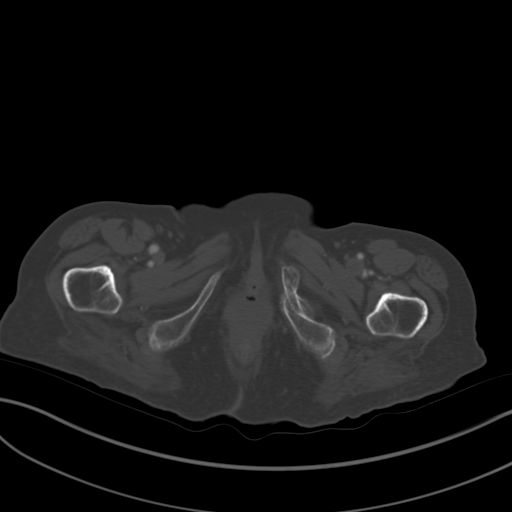
[im 14/82  soft-tissue]
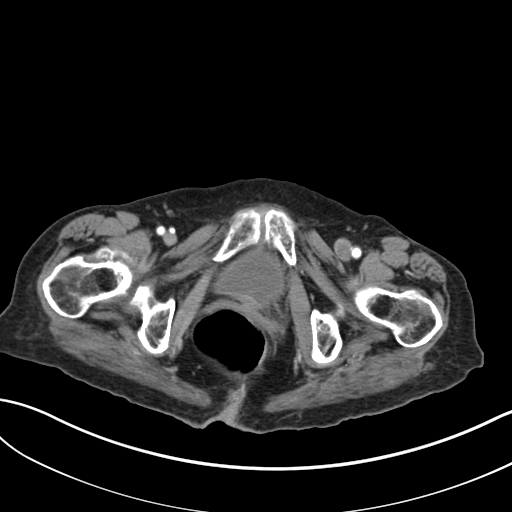
[im 21/82  soft-tissue]
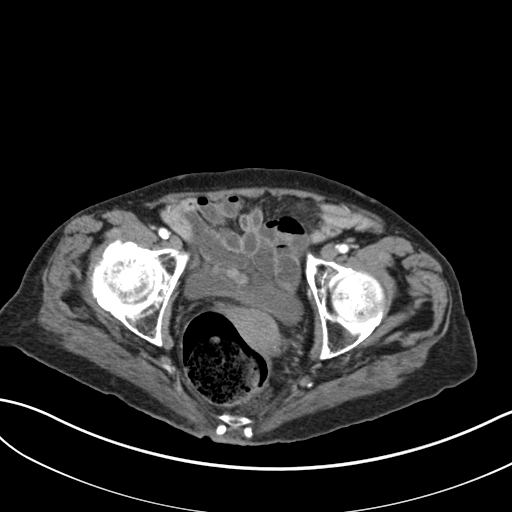
[im 28/82  soft-tissue]
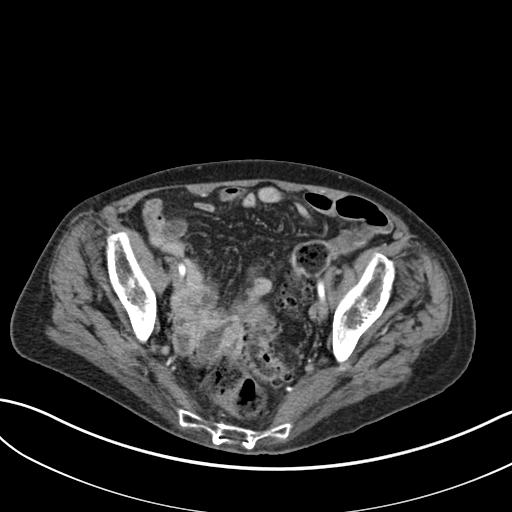
[im 34/82  soft-tissue]
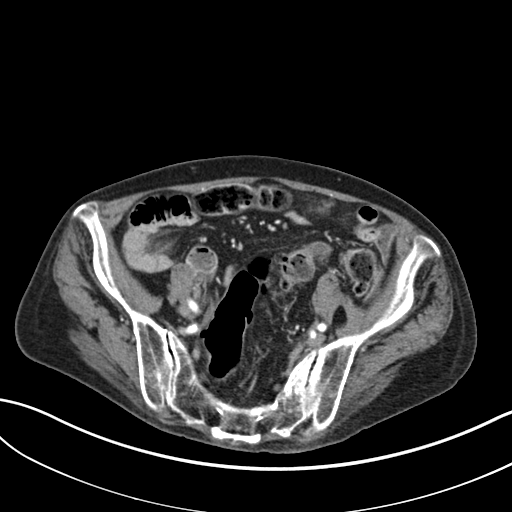
[im 41/82  soft-tissue]
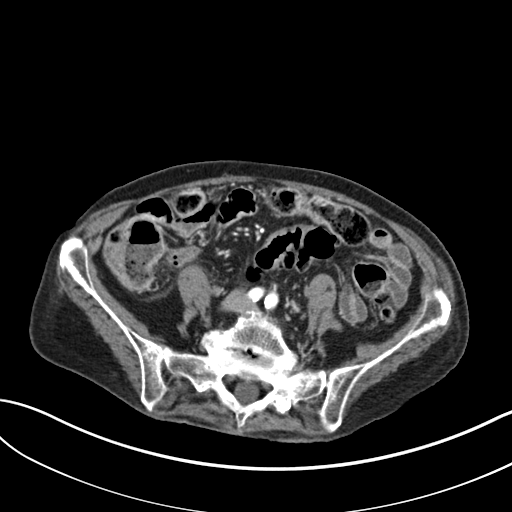
[im 48/82  soft-tissue]
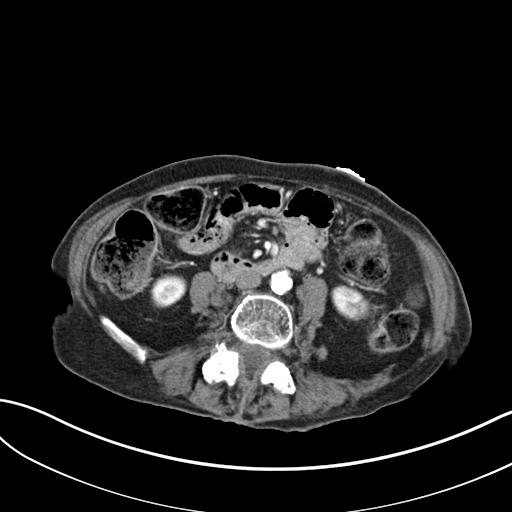
[im 55/82  soft-tissue]
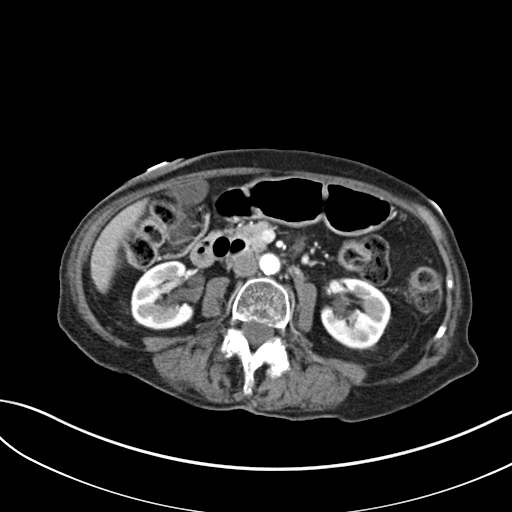
[im 61/82  soft-tissue]
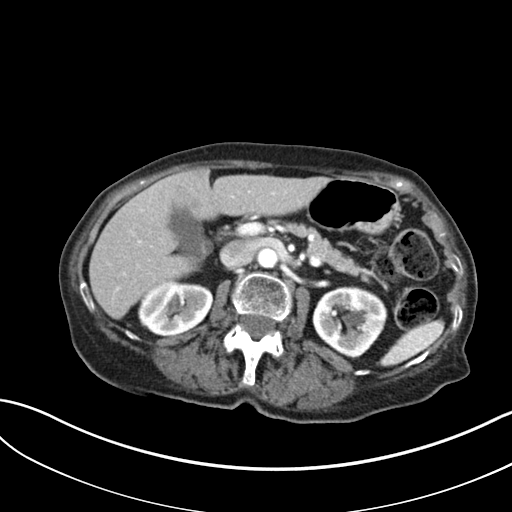
[im 61/82  bone]
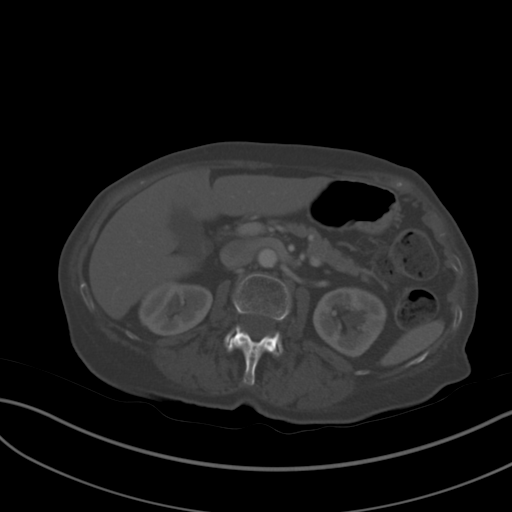
[im 68/82  soft-tissue]
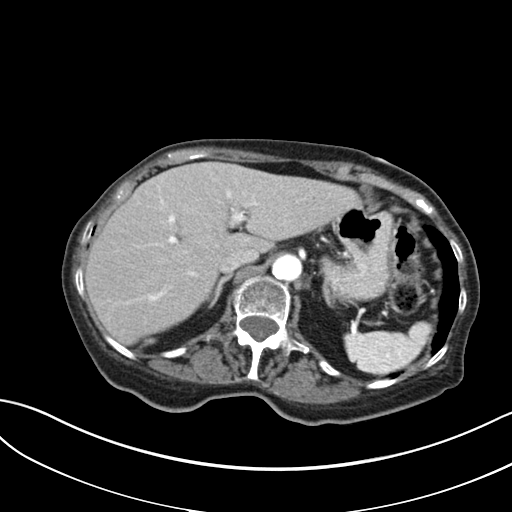
[im 75/82  soft-tissue]
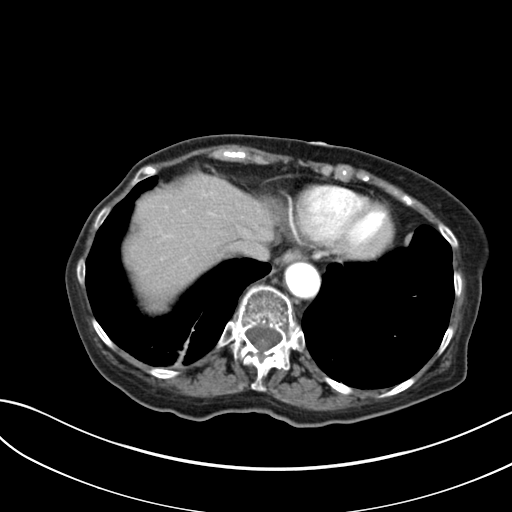

[Series 4: coronal st · coronal · 0.72mm/px · 3 of 146 slices shown]
[im 49/146  soft-tissue]
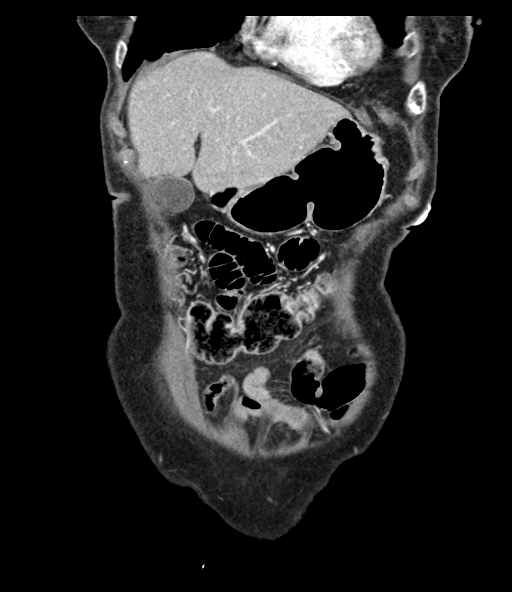
[im 65/146  soft-tissue]
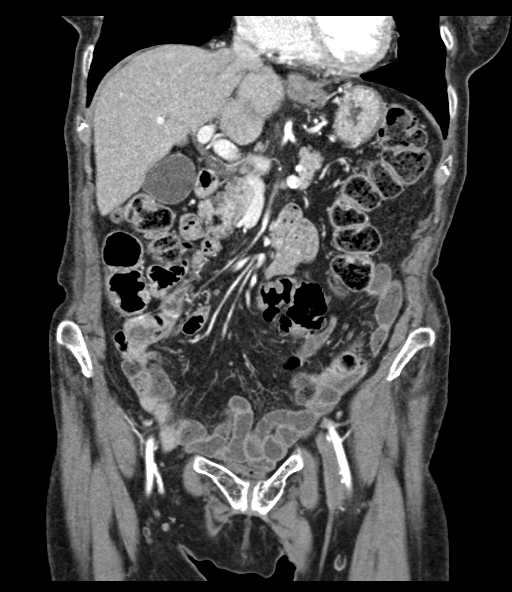
[im 81/146  soft-tissue]
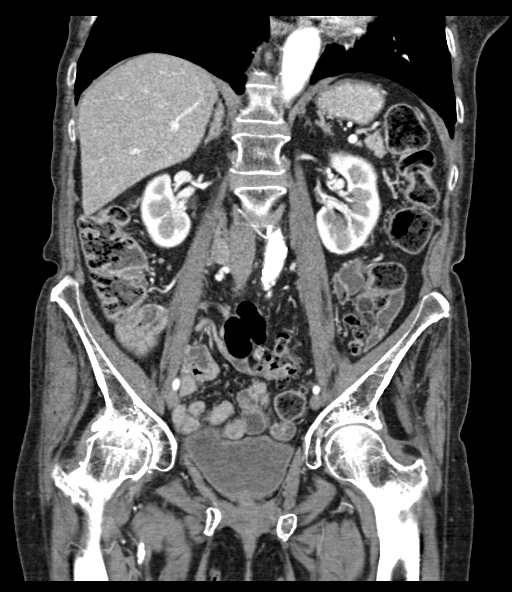

[Series 6: lung bases · axial · 0.73mm/px · z∈[-255,-227]mm · 2 of 82 slices shown]
[im 7/82  bone]
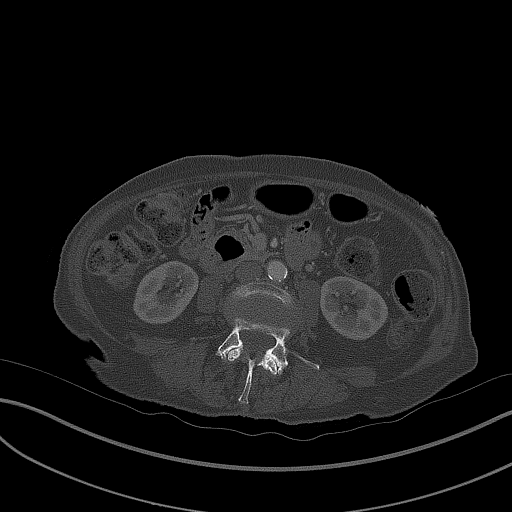
[im 21/82  bone]
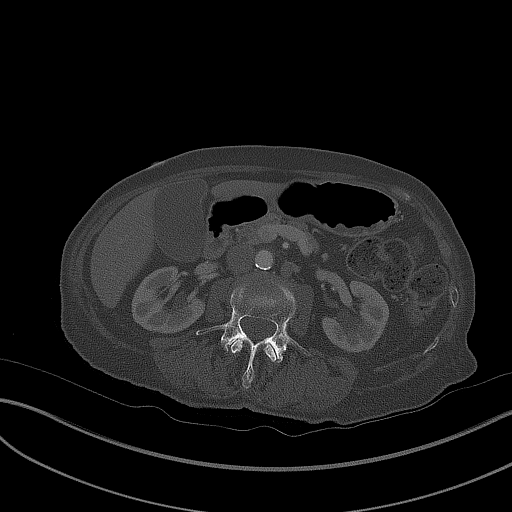

[16 of 46 positions shown; findings below may reference images not displayed]

FINDINGS: Lower chest: Bilateral lower lobe linear atelectasis.
Redemonstration of a 9 mm calcified right lower lobe pulmonary
nodule.

Hepatobiliary: No focal liver abnormality. No gallstones,
gallbladder wall thickening, or pericholecystic fluid. No biliary
dilatation.

Pancreas: No focal lesion. Normal pancreatic contour. No surrounding
inflammatory changes. No main pancreatic ductal dilatation.

Spleen: Normal in size without focal abnormality.

Adrenals/Urinary Tract:

No adrenal nodule bilaterally.

Bilateral kidneys enhance symmetrically. Subcentimeter hypodensities
are too small to characterize.

No hydronephrosis. No hydroureter.

The urinary bladder is unremarkable.

On delayed imaging, there is no urothelial wall thickening and there
are no filling defects in the opacified portions of the bilateral
collecting systems or ureters.

Stomach/Bowel: Stomach is within normal limits. No evidence of bowel
wall thickening or dilatation. Second portion of the duodenum
diverticula. Diffuse sigmoid diverticulosis. Appendix appears
normal.

Vascular/Lymphatic: No abdominal aorta or iliac aneurysm. At least
moderate calcified and noncalcified atherosclerotic plaque of the
aorta and its branches. No abdominal, pelvic, or inguinal
lymphadenopathy.

Reproductive: Uterus and bilateral adnexa are unremarkable.

Other: No intraperitoneal free fluid. No intraperitoneal free gas.
No organized fluid collection.

Musculoskeletal:

No abdominal wall hernia or abnormality.

No suspicious lytic or blastic osseous lesions. No acute displaced
fracture. Multilevel degenerative changes of the spine. Stable grade
1 anterolisthesis of L4 on L5. Old healed left pelvic fracture.
Moderate severe right hip degenerative changes.
IMPRESSION: 1. Diffuse sigmoid diverticulosis with no acute diverticulitis.
2.  Aortic Atherosclerosis ([QH]-[QH]).

## 2020-09-19 MED ORDER — IOHEXOL 350 MG/ML SOLN
80.0000 mL | Freq: Once | INTRAVENOUS | Status: AC | PRN
  Administered 2020-09-19: 80 mL via INTRAVENOUS

## 2020-09-19 MED ORDER — LACTATED RINGERS IV BOLUS
500.0000 mL | Freq: Once | INTRAVENOUS | Status: AC
  Administered 2020-09-19: 500 mL via INTRAVENOUS

## 2020-09-19 MED ORDER — CARBIDOPA-LEVODOPA 10-100 MG PO TABS
2.0000 | ORAL_TABLET | Freq: Once | ORAL | Status: DC
  Filled 2020-09-19: qty 2

## 2020-09-19 MED ORDER — METOPROLOL TARTRATE 25 MG PO TABS: 12.5000 mg | ORAL_TABLET | Freq: Once | ORAL | Status: DC

## 2020-09-19 NOTE — ED Notes (Signed)
Patient transported to CT 

## 2020-09-19 NOTE — ED Provider Notes (Signed)
[facility]-EMERGENCY DEPT Provider Note   CSN: [id number] Arrival date & time: 09/19/20  1939     History Chief Complaint  Patient presents with   Abdominal Pain   Constipation    Jackie Russman is a 85 y.o. female.   Abdominal Pain Associated symptoms: constipation   Associated symptoms: no chest pain, no chills, no cough, no dysuria, no fever, no hematuria, no nausea, no shortness of breath, no sore throat and no vomiting   Constipation Associated symptoms: abdominal pain   Associated symptoms: no back pain, no dysuria, no fever, no nausea and no vomiting   Patient presents for abdominal pain.  History is provided mostly by her daughter, who she lives with.  Patient's daughter reports that she has had poor p.o. intake and ongoing deconditioning resulting in decreased mobility.  She has required extra assistance with movement at home.  She has had symptoms of parkinsonism with slowed movements and pill-rolling tremors.  She was recently started on Sinemet.  She had a stay in a rehab facility last month.  She has since been living with her daughter.  She has had ongoing depressive symptoms as well.  Patient's depressive symptoms, functional weakness, and decreased mobility have been a steady decline.  She has also had chronic difficulty with bowel movements and urination.  Her decreased p.o. intake is also been longstanding and progressive.  What is new is that the patient endorse some abdominal pain earlier today.  Her abdominal pain was described at home as generalized, extending from her epigastrium to her pelvis.  Patient complaining of abdominal pain is not typical for her.  This was the reason she was sent to the ED.  She does have a history of UTIs and constipation.  Patient, herself, denies any current abdominal pain.  She states that she had a bowel movement 3 days ago.  She states that her typical bowel movement pattern is every other day.    Past Medical  History:  Diagnosis Date   Hot flashes 06/06/2020   Hypertension     Patient Active Problem List   Diagnosis Date Noted   Acute blood loss anemia    Malnutrition of moderate degree 08/19/2020   Shuffling gait    Hypoalbuminemia due to protein-calorie malnutrition (HCC)    Essential hypertension    History of supraventricular tachycardia    Failure to thrive in adult 08/05/2020   Urinary retention 08/05/2020   Osteoporosis 08/05/2020   Debility 08/02/2020   Pressure injury of skin 08/02/2020   Protein-calorie malnutrition, severe 07/28/2020   Dehydration 07/16/2020   Hyponatremia 07/15/2020   Suicidal ideations 07/15/2020   MDD (major depressive disorder), recurrent episode, severe (HCC) 07/15/2020   UTI (urinary tract infection) 07/14/2020   Paroxysmal SVT (supraventricular tachycardia) (HCC) 06/14/2020   Hot flashes 06/06/2020   Fever 05/27/2020   Weight loss 05/27/2020   Tinea corporis 05/27/2020   Anemia 05/27/2020   Hypertension 02/05/2020   Bilateral sensorineural hearing loss 01/16/2020   Benign hypertensive heart disease without congestive heart failure 12/17/2013   Hyperlipidemia 12/17/2013   Primary open angle glaucoma 08/27/2011    History reviewed. No pertinent surgical history.   OB History   No obstetric history on file.     Family History  Problem Relation Age of Onset   Hypertension Other     Social History   Tobacco Use   Smoking status: Never   Smokeless tobacco: Never  Vaping Use   Vaping Use: Never used  Substance Use Topics   Drug use: Never    Home Medications Prior to Admission medications   Medication Sig Start Date End Date Taking? Authorizing Provider  acetaminophen (TYLENOL) 325 MG tablet Take 1-2 tablets (325-650 mg total) by mouth every 4 (four) hours as needed for mild pain. 08/22/20   Angiulli, Mcarthur Rossetti, PA-C  bimatoprost (LUMIGAN) 0.01 % SOLN Place 1 drop into the left eye at bedtime. 06/19/13   [provider]   brinzolamide (AZOPT) 1 % ophthalmic suspension Place 1 drop into the left eye 2 (two) times daily. 01/04/19   [provider]  busPIRone (BUSPAR) 5 MG tablet Take 1 tablet (5 mg total) by mouth 3 (three) times daily. 08/22/20   Angiulli, Mcarthur Rossetti, PA-C  carbidopa-levodopa (SINEMET IR) 10-100 MG tablet Take 2 tablets by mouth 3 (three) times daily. 08/22/20   Angiulli, Mcarthur Rossetti, PA-C  cholecalciferol (VITAMIN D) 25 MCG (1000 UNIT) tablet Take 1 tablet (1,000 Units total) by mouth daily. 08/22/20   Angiulli, Mcarthur Rossetti, PA-C  FLUoxetine (PROZAC) 10 MG capsule Take 3 capsules (30 mg total) by mouth daily. 08/22/20   Angiulli, Mcarthur Rossetti, PA-C  megestrol (MEGACE) 400 MG/10ML suspension Take 20 mLs (800 mg total) by mouth daily. 08/22/20   Angiulli, Mcarthur Rossetti, PA-C  metoprolol tartrate (LOPRESSOR) 25 MG tablet Take 1 tablet (25 mg total) by mouth 2 (two) times daily. 08/22/20   Angiulli, Mcarthur Rossetti, PA-C  Multiple Vitamins-Minerals (MULTIVITAMIN WITH MINERALS) tablet Take 1 tablet by mouth daily.    [provider]  omeprazole (PRILOSEC) 40 MG capsule Take 1 capsule (40 mg total) by mouth daily as needed (heartburn). 08/22/20   Angiulli, Mcarthur Rossetti, PA-C  polyethylene glycol (MIRALAX / GLYCOLAX) 17 g packet Take 17 g by mouth daily as needed for mild constipation.    [provider]  simvastatin (ZOCOR) 20 MG tablet Take 1 tablet (20 mg total) by mouth daily. 08/22/20   Angiulli, Mcarthur Rossetti, PA-C  traZODone (DESYREL) 50 MG tablet Take 0.5 tablets (25 mg total) by mouth at bedtime. 08/22/20   Angiulli, Mcarthur Rossetti, PA-C    Allergies    Patient has no known allergies.  Review of Systems   Review of Systems  Constitutional:  Negative for chills and fever.       Chronic, progressive decreased activity and decreased appetite  HENT:  Negative for ear pain and sore throat.   Eyes:  Negative for pain and visual disturbance.  Respiratory:  Negative for cough, chest tightness and shortness of breath.    Cardiovascular:  Negative for chest pain and palpitations.  Gastrointestinal:  Positive for abdominal pain and constipation. Negative for abdominal distention, nausea and vomiting.  Genitourinary:  Negative for dysuria, hematuria and pelvic pain.  Musculoskeletal:  Negative for arthralgias, back pain, myalgias and neck pain.  Skin:  Negative for color change and rash.  Neurological:  Negative for dizziness, seizures, syncope, weakness and numbness.  Psychiatric/Behavioral:  Positive for dysphoric mood (Chronic).   All other systems reviewed and are negative.  Physical Exam Updated Vital Signs BP (!) 155/72   Pulse 66   Temp 98.2 F (36.8 C) (Oral)   Resp 18   Ht 5\' 7"  (1.702 m)   Wt 59 kg   SpO2 100%   BMI 20.36 kg/m   Physical Exam Vitals and nursing note reviewed.  Constitutional:      General: She is not in acute distress.    Appearance: She is well-developed. She is  not ill-appearing, toxic-appearing or diaphoretic.  HENT:     Head: Normocephalic and atraumatic.     Mouth/Throat:     Mouth: Mucous membranes are moist.     Pharynx: Oropharynx is clear.  Eyes:     Conjunctiva/sclera: Conjunctivae normal.  Cardiovascular:     Rate and Rhythm: Normal rate and regular rhythm.     Heart sounds: No murmur heard. Pulmonary:     Effort: Pulmonary effort is normal. No respiratory distress.     Breath sounds: Normal breath sounds. No wheezing or rales.  Abdominal:     Palpations: Abdomen is soft. There is no mass.     Tenderness: There is no abdominal tenderness. There is no right CVA tenderness or left CVA tenderness.  Musculoskeletal:     Cervical back: Neck supple.  Skin:    General: Skin is warm and dry.  Neurological:     General: No focal deficit present.     Mental Status: She is alert. Mental status is at baseline.     Cranial Nerves: Cranial nerves are intact.     Sensory: Sensation is intact.     Motor: Motor function is intact.  Psychiatric:        Mood and  Affect: Affect is flat.        Speech: Speech is delayed.        Behavior: Behavior is slowed and withdrawn. Behavior is cooperative.    ED Results / Procedures / Treatments   Labs (all labs ordered are listed, but only abnormal results are displayed) Labs Reviewed  COMPREHENSIVE METABOLIC PANEL - Abnormal; Notable for the following components:      Result Value   Glucose, Bld 121 (*)    BUN 27 (*)    All other components within normal limits  CBC WITH DIFFERENTIAL/PLATELET - Abnormal; Notable for the following components:   Hemoglobin 11.9 (*)    All other components within normal limits  URINALYSIS, COMPLETE (UACMP) WITH MICROSCOPIC - Abnormal; Notable for the following components:   Specific Gravity, Urine >1.046 (*)    Ketones, ur 5 (*)    Leukocytes,Ua SMALL (*)    Bacteria, UA FEW (*)    All other components within normal limits  LIPASE, BLOOD    EKG EKG Interpretation  Date/Time:  Thursday September 19 2020 20:28:55 EDT Ventricular Rate:  66 PR Interval:  154 QRS Duration: 86 QT Interval:  430 QTC Calculation: 451 R Axis:   18 Text Interpretation: Sinus rhythm Atrial premature complex Confirmed by Gloris Manchester [phone]) on 09/19/2020 10:14:38 PM  Radiology CT ABDOMEN PELVIS W CONTRAST  Result Date: 09/19/2020 CLINICAL DATA:  Bowel obstruction suspected. EXAM: CT ABDOMEN AND PELVIS WITH CONTRAST TECHNIQUE: Multidetector CT imaging of the abdomen and pelvis was performed using the standard protocol following bolus administration of intravenous contrast. CONTRAST:  63mL OMNIPAQUE IOHEXOL 350 MG/ML SOLN COMPARISON:  CT abdomen pelvis 01/01/2020 FINDINGS: Lower chest: Bilateral lower lobe linear atelectasis. Redemonstration of a 9 mm calcified right lower lobe pulmonary nodule. Hepatobiliary: No focal liver abnormality. No gallstones, gallbladder wall thickening, or pericholecystic fluid. No biliary dilatation. Pancreas: No focal lesion. Normal pancreatic contour. No surrounding  inflammatory changes. No main pancreatic ductal dilatation. Spleen: Normal in size without focal abnormality. Adrenals/Urinary Tract: No adrenal nodule bilaterally. Bilateral kidneys enhance symmetrically. Subcentimeter hypodensities are too small to characterize. No hydronephrosis. No hydroureter. The urinary bladder is unremarkable. On delayed imaging, there is no urothelial wall thickening and there are no filling defects in  the opacified portions of the bilateral collecting systems or ureters. Stomach/Bowel: Stomach is within normal limits. No evidence of bowel wall thickening or dilatation. Second portion of the duodenum diverticula. Diffuse sigmoid diverticulosis. Appendix appears normal. Vascular/Lymphatic: No abdominal aorta or iliac aneurysm. At least moderate calcified and noncalcified atherosclerotic plaque of the aorta and its branches. No abdominal, pelvic, or inguinal lymphadenopathy. Reproductive: Uterus and bilateral adnexa are unremarkable. Other: No intraperitoneal free fluid. No intraperitoneal free gas. No organized fluid collection. Musculoskeletal: No abdominal wall hernia or abnormality. No suspicious lytic or blastic osseous lesions. No acute displaced fracture. Multilevel degenerative changes of the spine. Stable grade 1 anterolisthesis of L4 on L5. Old healed left pelvic fracture. Moderate severe right hip degenerative changes. IMPRESSION: 1. Diffuse sigmoid diverticulosis with no acute diverticulitis. 2.  Aortic Atherosclerosis (ICD10-I70.0). Electronically Signed   By: Tish Frederickson M.D.   On: 09/19/2020 23:13    Procedures Procedures   Medications Ordered in ED Medications  lactated ringers bolus 500 mL (0 mLs Intravenous Stopped 09/20/20 0005)  iohexol (OMNIPAQUE) 350 MG/ML injection 80 mL (80 mLs Intravenous Contrast Given 09/19/20 2248)    ED Course  I have reviewed the triage vital signs and the nursing notes.  Pertinent labs & imaging results that were available during  my care of the patient were reviewed by me and considered in my medical decision making (see chart for details).    MDM Rules/Calculators/A&P                          Patient presents for decreased p.o. intake, which is a chronic condition for her, as well as new onset abdominal pain.  On arrival in the ED, vital signs are normal.  She denies any current abdominal pain.  She has no tenderness on exam.  Laboratory work-up was initiated.  Results of lab work were unremarkable.  CT scan of abdomen and pelvis was ordered.  Results showed no acute findings.  At time of signout, results of urinalysis were pending.  Care of patient signed out to oncoming ED provider.  Final Clinical Impression(s) / ED Diagnoses Final diagnoses:  Generalized abdominal pain  Constipation, unspecified constipation type    Rx / DC Orders ED Discharge Orders     None        Gloris Manchester, MD 09/20/20 1451

## 2020-09-19 NOTE — ED Provider Notes (Signed)
Emergency Medicine Provider Triage Evaluation Note  Sheila Andrade , a 85 y.o. female  was evaluated in triage.  Pt complains of abdominal pain and constipation.  Last bowel movement was 3 days ago and was very small.  Patient has been taking MiraLAX and Senokot without relief.  She also has had decreased appetite over the past few weeks.  She reports her abdomen is generally tender. EMS reports that patient's daughter is a GI doctor and requested the patient be scanned.  Review of Systems  Positive: Constipation, abdominal pain.  Negative: Fevers  Physical Exam  BP 115/62 (BP Location: Left Arm)   Pulse 66   Temp 98.2 F (36.8 C) (Oral)   Resp 18   Ht 5\' 7"  (1.702 m)   Wt 59 kg   SpO2 100%   BMI 20.36 kg/m  Gen:   Awake, no distress   Resp:  Normal effort  MSK:   Moves extremities without difficulty  Other:  Abdomen is diffusely uncomfortable with palpation.   Medical Decision Making  Medically screening exam initiated at 7:54 PM.  Appropriate orders placed.  Sheila Andrade was informed that the remainder of the evaluation will be completed by another provider, this initial triage assessment does not replace that evaluation, and the importance of remaining in the ED until their evaluation is complete.  Note: Portions of this report may have been transcribed using voice recognition software. Every effort was made to ensure accuracy; however, inadvertent computerized transcription errors may be present    Elyse Jarvis 09/19/20 1957    09/21/20, MD 09/19/20 2312

## 2020-09-19 NOTE — ED Triage Notes (Signed)
Pt BIB EMS from home. Pt c/o abdominal pain secondary to constipation. Pt states last bowel movement was 3 days ago and was very small. Pt states she has taken Miralax and senokot with no relief. Pt states she has had decreased appetite the last few weeks, but that's normal when she gets constipated per family. EMS reports normal bowel sounds. Pt daughter is a GI doctor and wants pt to be scanned.

## 2020-09-20 LAB — URINALYSIS, COMPLETE (UACMP) WITH MICROSCOPIC
Bilirubin Urine: NEGATIVE
Glucose, UA: NEGATIVE mg/dL
Hgb urine dipstick: NEGATIVE
Ketones, ur: 5 mg/dL — AB
Nitrite: NEGATIVE
Protein, ur: NEGATIVE mg/dL
Specific Gravity, Urine: >1.046 — ABNORMAL HIGH (ref 1.005–1.030)
pH: 6 (ref 5.0–8.0)

## 2020-09-20 NOTE — ED Provider Notes (Signed)
I assumed care at signout to follow-up on labs.  Patient presented for abdominal pain and constipation. Overall labs and CT imaging are negative. Patient taking p.o. fluids.  She is in no distress.  She is able to ambulate with a walker.  Nursing reports that  she had steady gait I spoke to daughter about CT imaging and lab results.  Patient will be discharged   Zadie Rhine, MD 09/20/20 [phone]

## 2020-09-20 NOTE — ED Notes (Signed)
Pt ambulated with walker, stated she did not like the walker because it was shaky.  Explained to pt the walker was temporary.  Pt able to walk forward and backward with walker.  Provider notified.

## 2020-09-24 ENCOUNTER — Telehealth: Payer: Self-pay | Admitting: Physical Medicine and Rehabilitation

## 2020-09-24 NOTE — Telephone Encounter (Signed)
Marcelino Duster OT with [facility] needs to get verbal orders to have a social worker go out to home.  Please call her at [phone].

## 2020-09-25 NOTE — Telephone Encounter (Signed)
Approval given

## 2020-10-08 ENCOUNTER — Emergency Department (HOSPITAL_COMMUNITY): Payer: Medicare PPO

## 2020-10-08 ENCOUNTER — Emergency Department (HOSPITAL_COMMUNITY)
Admission: EM | Admit: 2020-10-08 | Discharge: 2020-10-08 | Disposition: A | Discharge status: Home / Self Care | Payer: Medicare PPO | Attending: Emergency Medicine | Admitting: Emergency Medicine

## 2020-10-08 ENCOUNTER — Encounter (HOSPITAL_COMMUNITY): Payer: Self-pay | Admitting: Emergency Medicine

## 2020-10-08 DIAGNOSIS — S0083XA Contusion of other part of head, initial encounter: Secondary | ICD-10-CM | POA: Diagnosis not present

## 2020-10-08 DIAGNOSIS — W19XXXA Unspecified fall, initial encounter: Secondary | ICD-10-CM

## 2020-10-08 DIAGNOSIS — S0003XA Contusion of scalp, initial encounter: Secondary | ICD-10-CM | POA: Insufficient documentation

## 2020-10-08 DIAGNOSIS — M25552 Pain in left hip: Secondary | ICD-10-CM | POA: Insufficient documentation

## 2020-10-08 DIAGNOSIS — W01198A Fall on same level from slipping, tripping and stumbling with subsequent striking against other object, initial encounter: Secondary | ICD-10-CM | POA: Insufficient documentation

## 2020-10-08 DIAGNOSIS — Z79899 Other long term (current) drug therapy: Secondary | ICD-10-CM | POA: Insufficient documentation

## 2020-10-08 DIAGNOSIS — M25512 Pain in left shoulder: Secondary | ICD-10-CM | POA: Insufficient documentation

## 2020-10-08 DIAGNOSIS — M546 Pain in thoracic spine: Secondary | ICD-10-CM | POA: Diagnosis not present

## 2020-10-08 DIAGNOSIS — I10 Essential (primary) hypertension: Secondary | ICD-10-CM | POA: Insufficient documentation

## 2020-10-08 DIAGNOSIS — S0990XA Unspecified injury of head, initial encounter: Secondary | ICD-10-CM | POA: Diagnosis present

## 2020-10-08 IMAGING — CT CT HEAD W/O CM
3 series · 15 of 47 positions shown, 18 images · non-contrast
Comparison: MRI head [DATE].

CLINICAL DATA: Fall.

EXAM:
CT HEAD WITHOUT CONTRAST
CT CERVICAL SPINE WITHOUT CONTRAST
TECHNIQUE: Multidetector CT imaging of the head and cervical spine was
performed following the standard protocol without intravenous
contrast. Multiplanar CT image reconstructions of the cervical spine
were also generated.

[Series 3: head wo · axial · 0.43mm/px · z∈[-129,+1]mm · 9 of 32 slices shown, 12 images]
[im 3/32  brain]
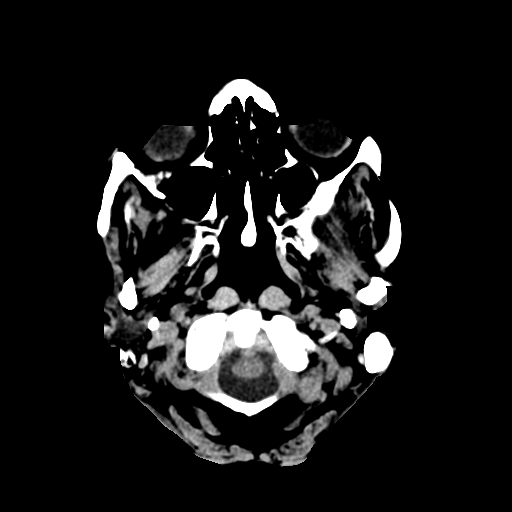
[im 3/32  bone]
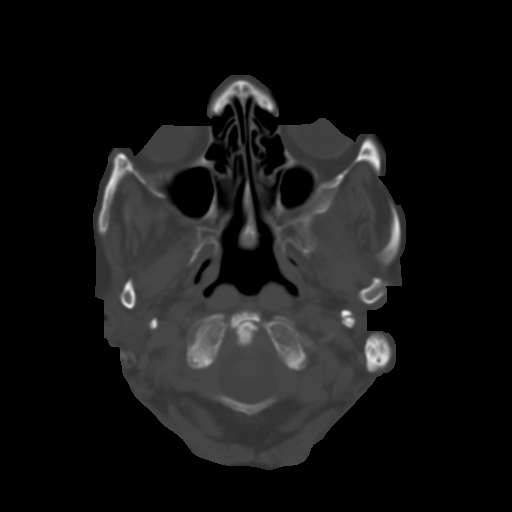
[im 6/32  brain]
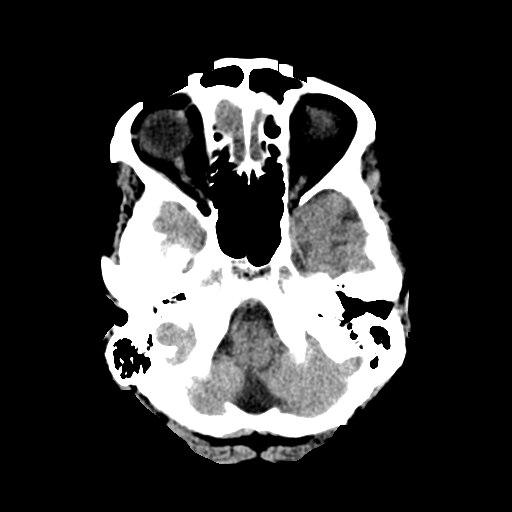
[im 9/32  brain]
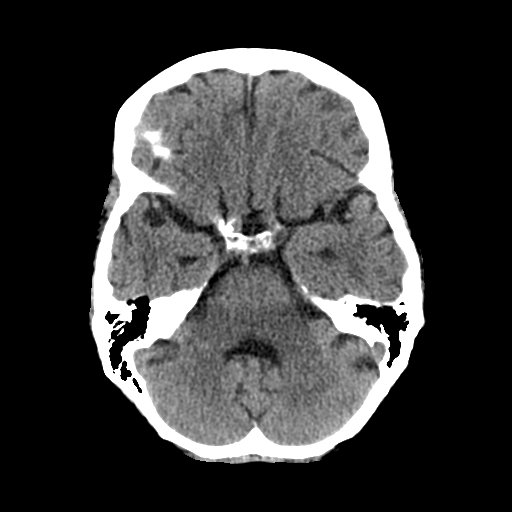
[im 12/32  brain]
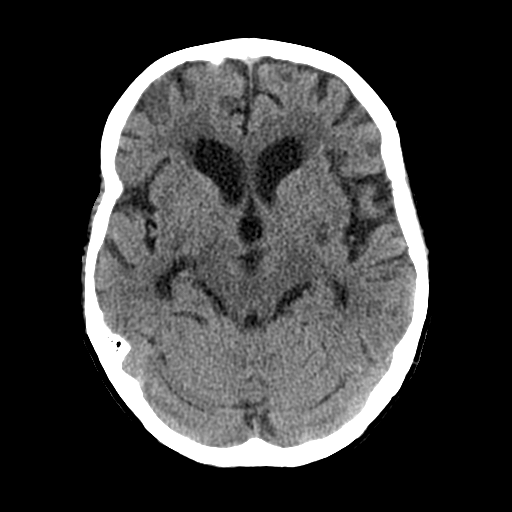
[im 17/32  brain]
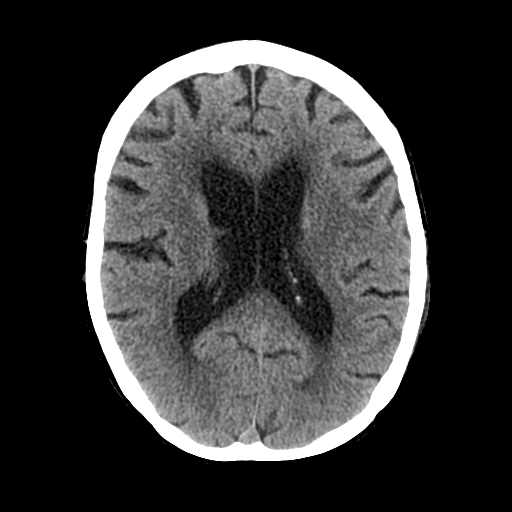
[im 17/32  bone]
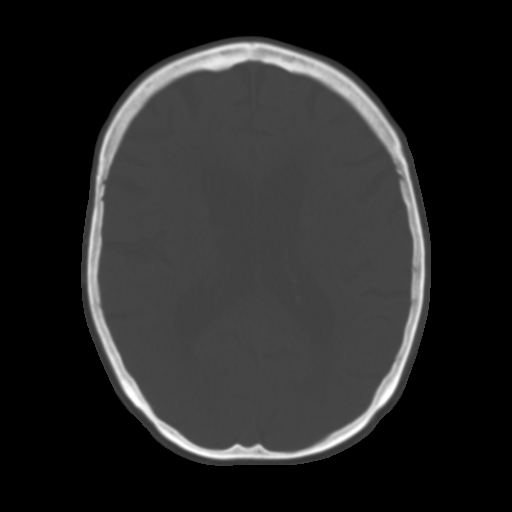
[im 20/32  brain]
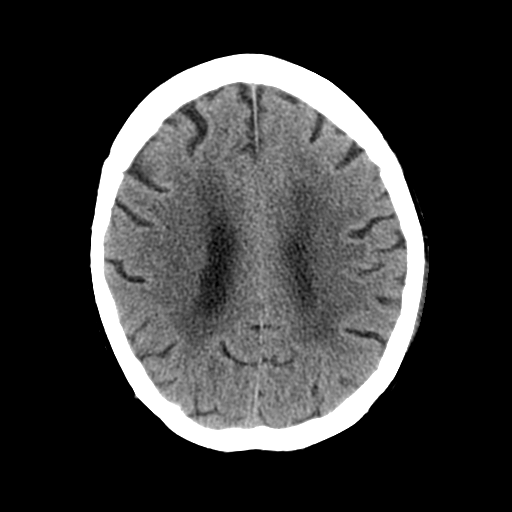
[im 23/32  brain]
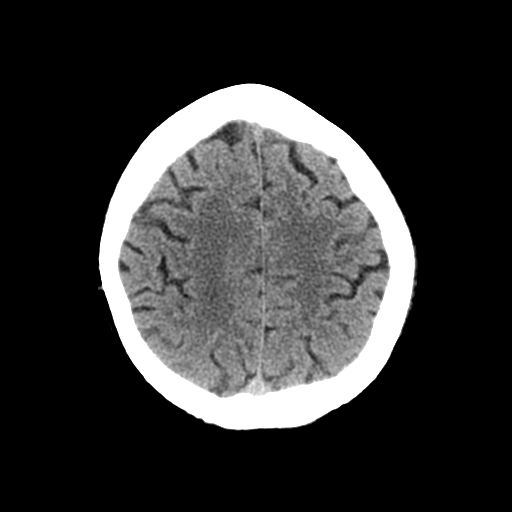
[im 26/32  brain]
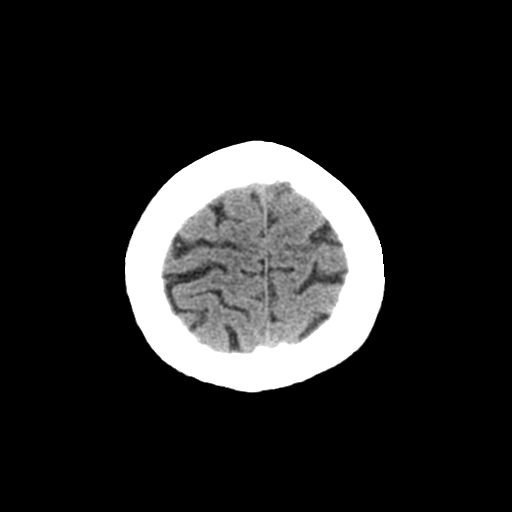
[im 29/32  brain]
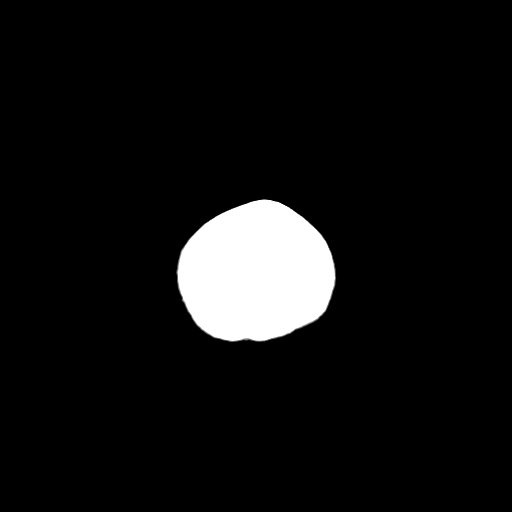
[im 29/32  bone]
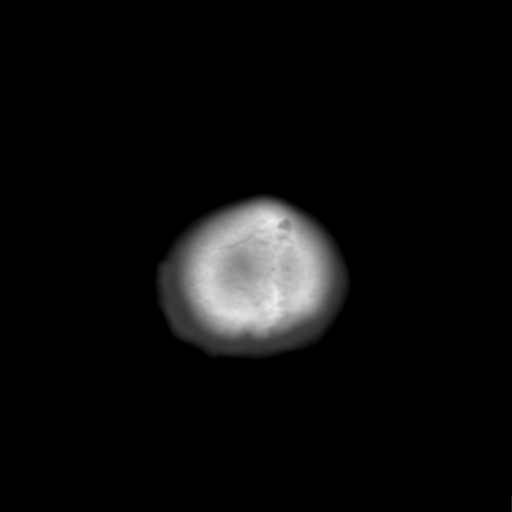

[Series 4: coronal soft tissue · coronal · 0.32mm/px · 3 of 74 slices shown]
[im 25/74  brain]
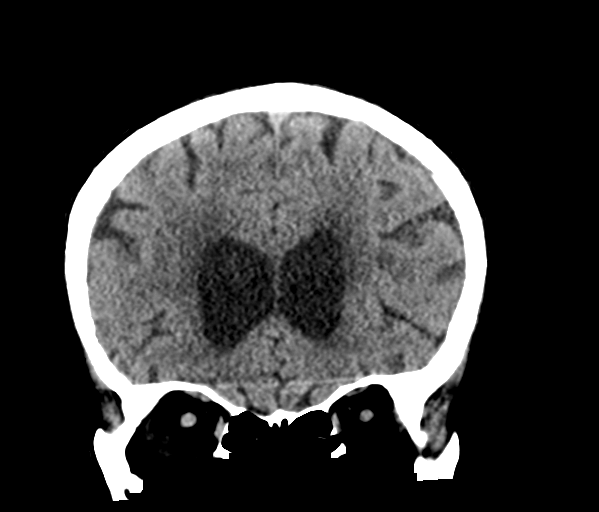
[im 33/74  brain]
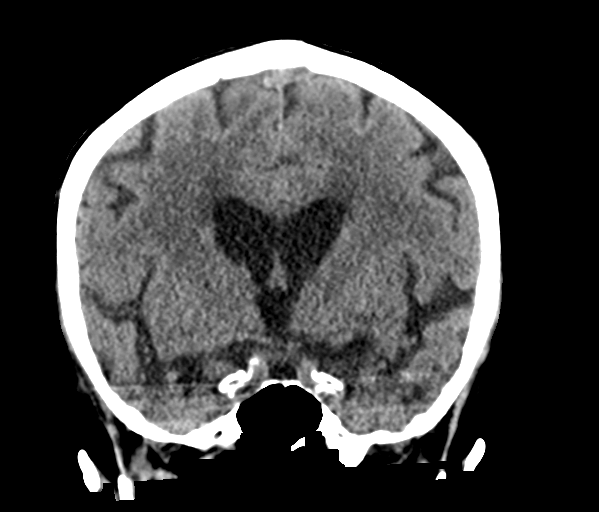
[im 41/74  brain]
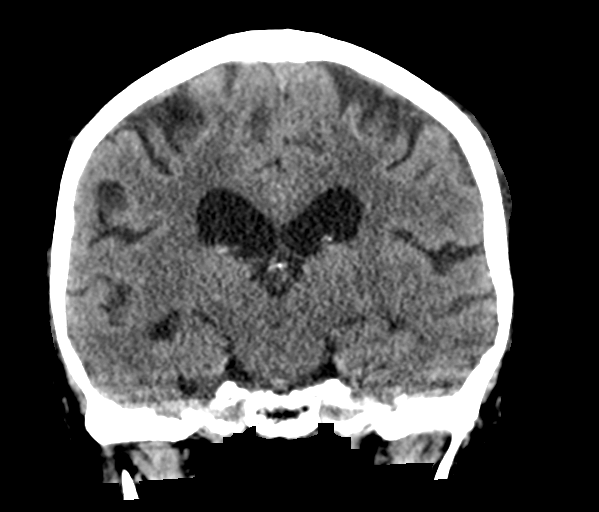

[Series 5: sagittal soft tissue · sagittal · 0.30mm/px · 3 of 66 slices shown]
[im 22/66  brain]
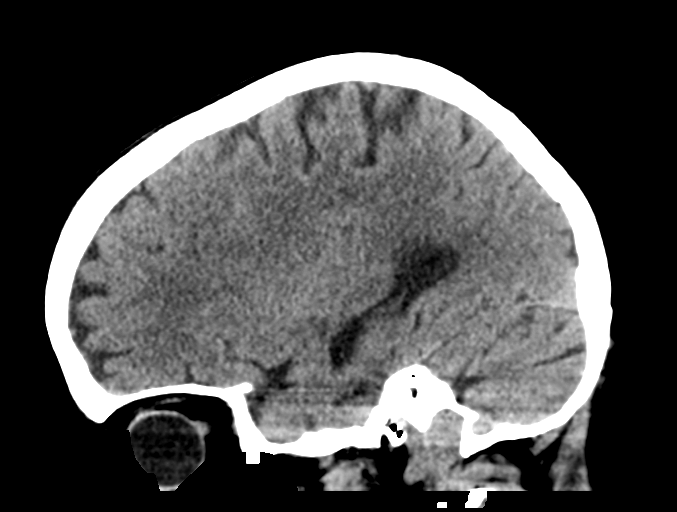
[im 33/66  brain]
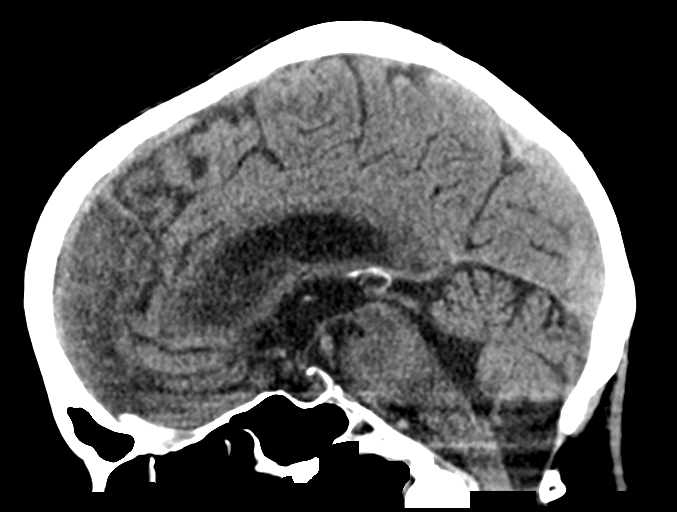
[im 44/66  brain]
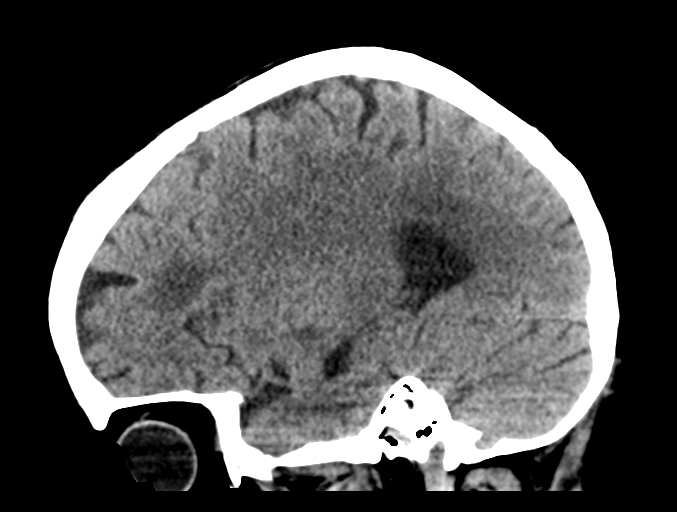

[15 of 47 positions shown; findings below may reference images not displayed]

FINDINGS: CT HEAD FINDINGS

Brain: No evidence of acute infarction, hemorrhage, hydrocephalus,
extra-axial collection or mass lesion/mass effect. There is mild
periventricular white matter hypodensity, likely chronic small
vessel ischemic change.

Vascular: No hyperdense vessel or unexpected calcification.

Skull: Normal. Negative for fracture or focal lesion.

Sinuses/Orbits: No acute finding.

Other: There is lateral left temporoparietal scalp soft tissue
swelling. Partial left mastoid effusion.

CT CERVICAL SPINE FINDINGS

Alignment: Normal.

Skull base and vertebrae: No acute fracture. No primary bone lesion
or focal pathologic process.

Soft tissues and spinal canal: No prevertebral fluid or swelling. No
visible canal hematoma.

Disc levels: There is mild disc space narrowing at C5-C6 and C6-C7
compatible with degenerative change. No significant central canal or
neural foraminal stenosis at any level.

Upper chest: Negative.

Other: None.
IMPRESSION: No acute intracranial process.

No acute fracture or traumatic subluxation of the cervical spine.

Left mastoid effusion.

## 2020-10-08 IMAGING — CR DG PELVIS 1-2V
1 series · 1 of 1 positions shown · non-contrast
Comparison: CT abdomen and pelvis [DATE].

CLINICAL DATA: Fall.

EXAM:
LEFT SHOULDER - 2+ VIEW; PELVIS - 1-2 VIEW; LEFT ELBOW - COMPLETE 3+
VIEW

[x pelvis]
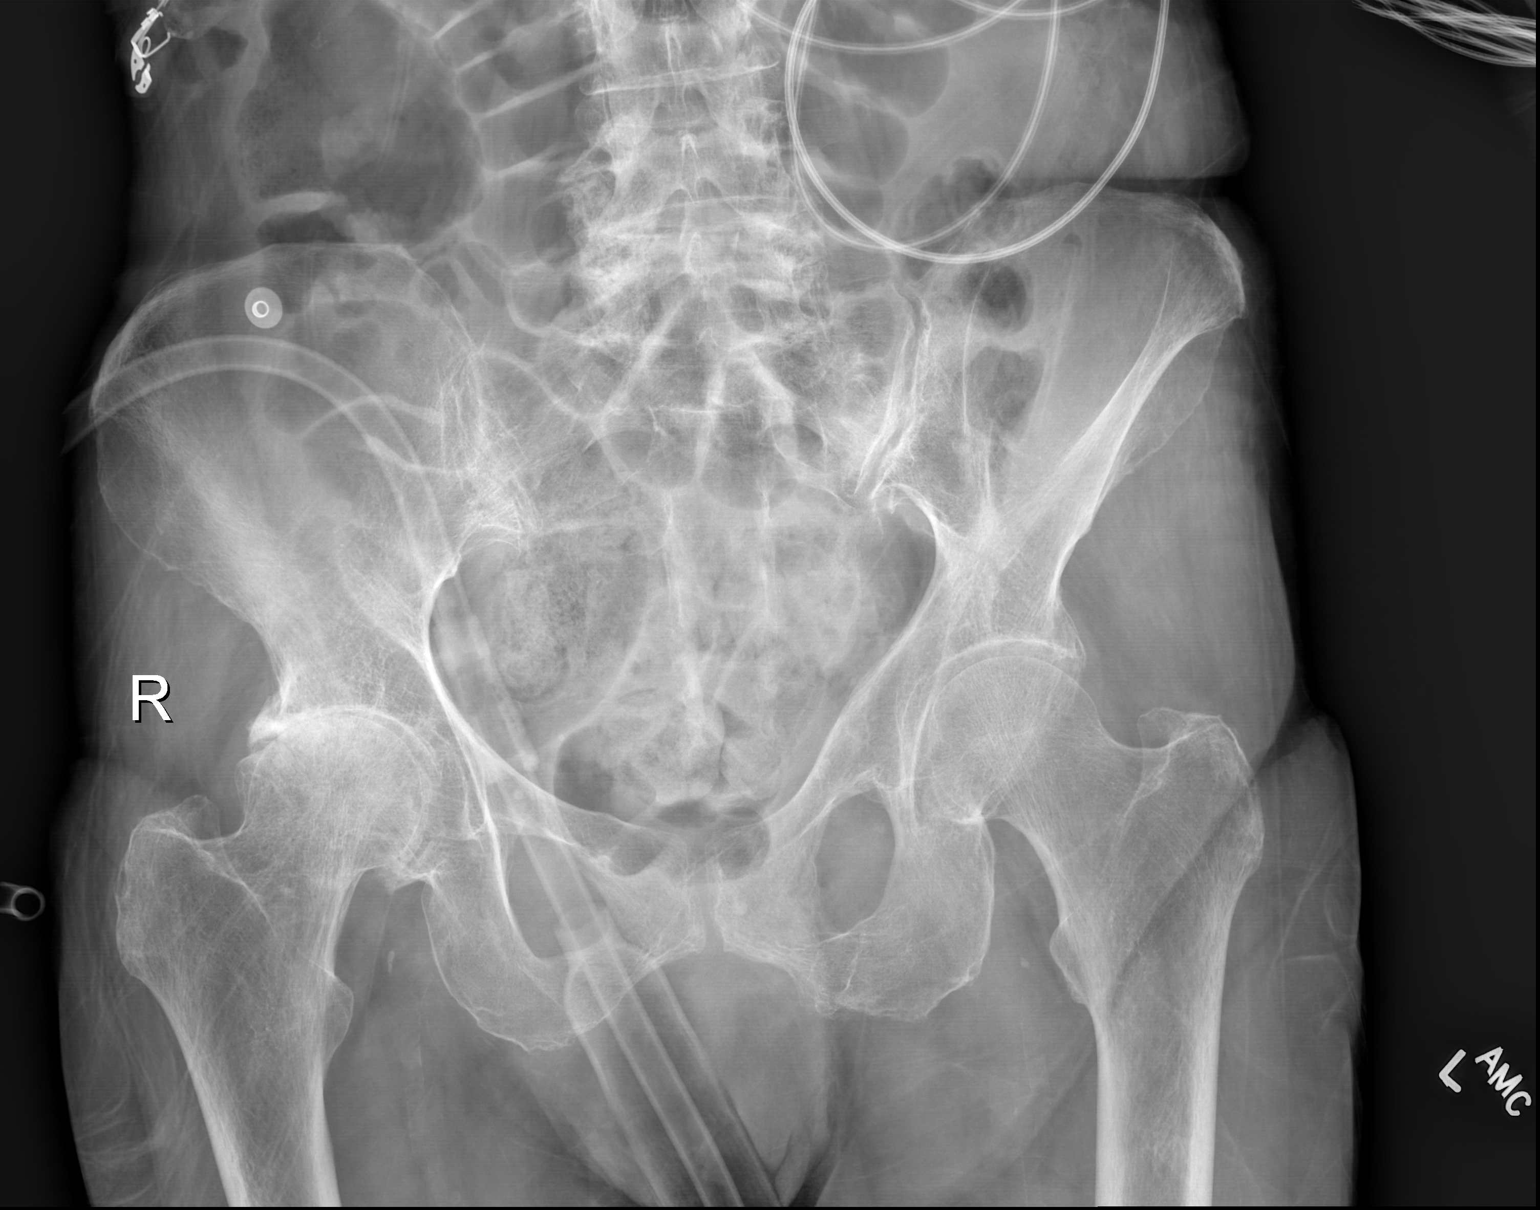

[1 of 1 positions shown; findings below may reference images not displayed]

FINDINGS: Pelvis: There is no evidence of acute fracture or dislocation.
Healed left inferior pubic ramus fracture is unchanged. There are
moderate severe degenerative changes of the right hip, unchanged.
Soft tissues are unremarkable.

Left shoulder: No acute fracture or dislocation. Joint spaces are
maintained. There are soft tissue calcifications superior to the
humeral head likely related to calcific tendinitis. There are
minimal degenerative changes of the acromioclavicular joint.

Left elbow: No acute fracture or dislocation. Joint spaces are
maintained. Soft tissues are within normal limits. Antecubital IV is
present.
IMPRESSION: 1. No acute fracture of the pelvis, left shoulder or left elbow.

## 2020-10-08 IMAGING — CR DG CHEST 2V
2 series · 2 of 2 positions shown · non-contrast
Comparison: [DATE]

CLINICAL DATA: Fall

EXAM:
CHEST - 2 VIEW

[x chest ap]
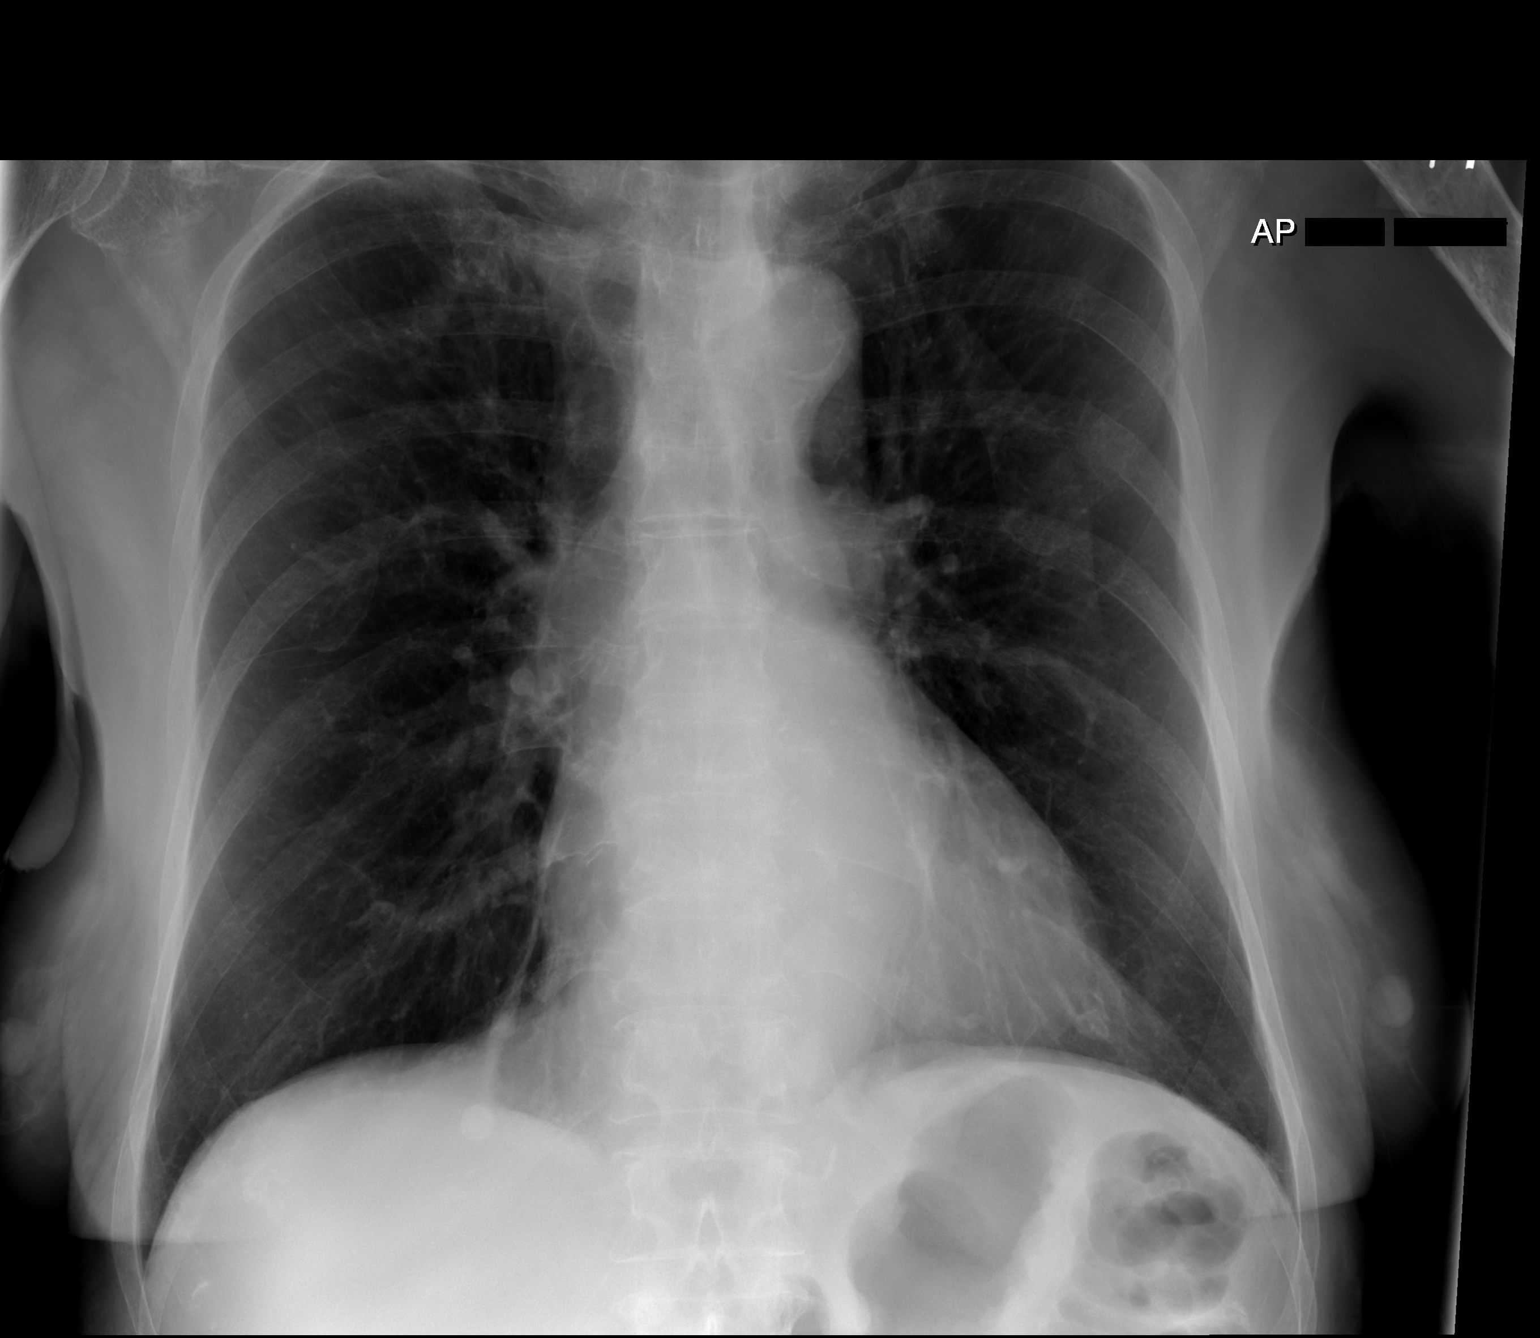

[w chest lat]
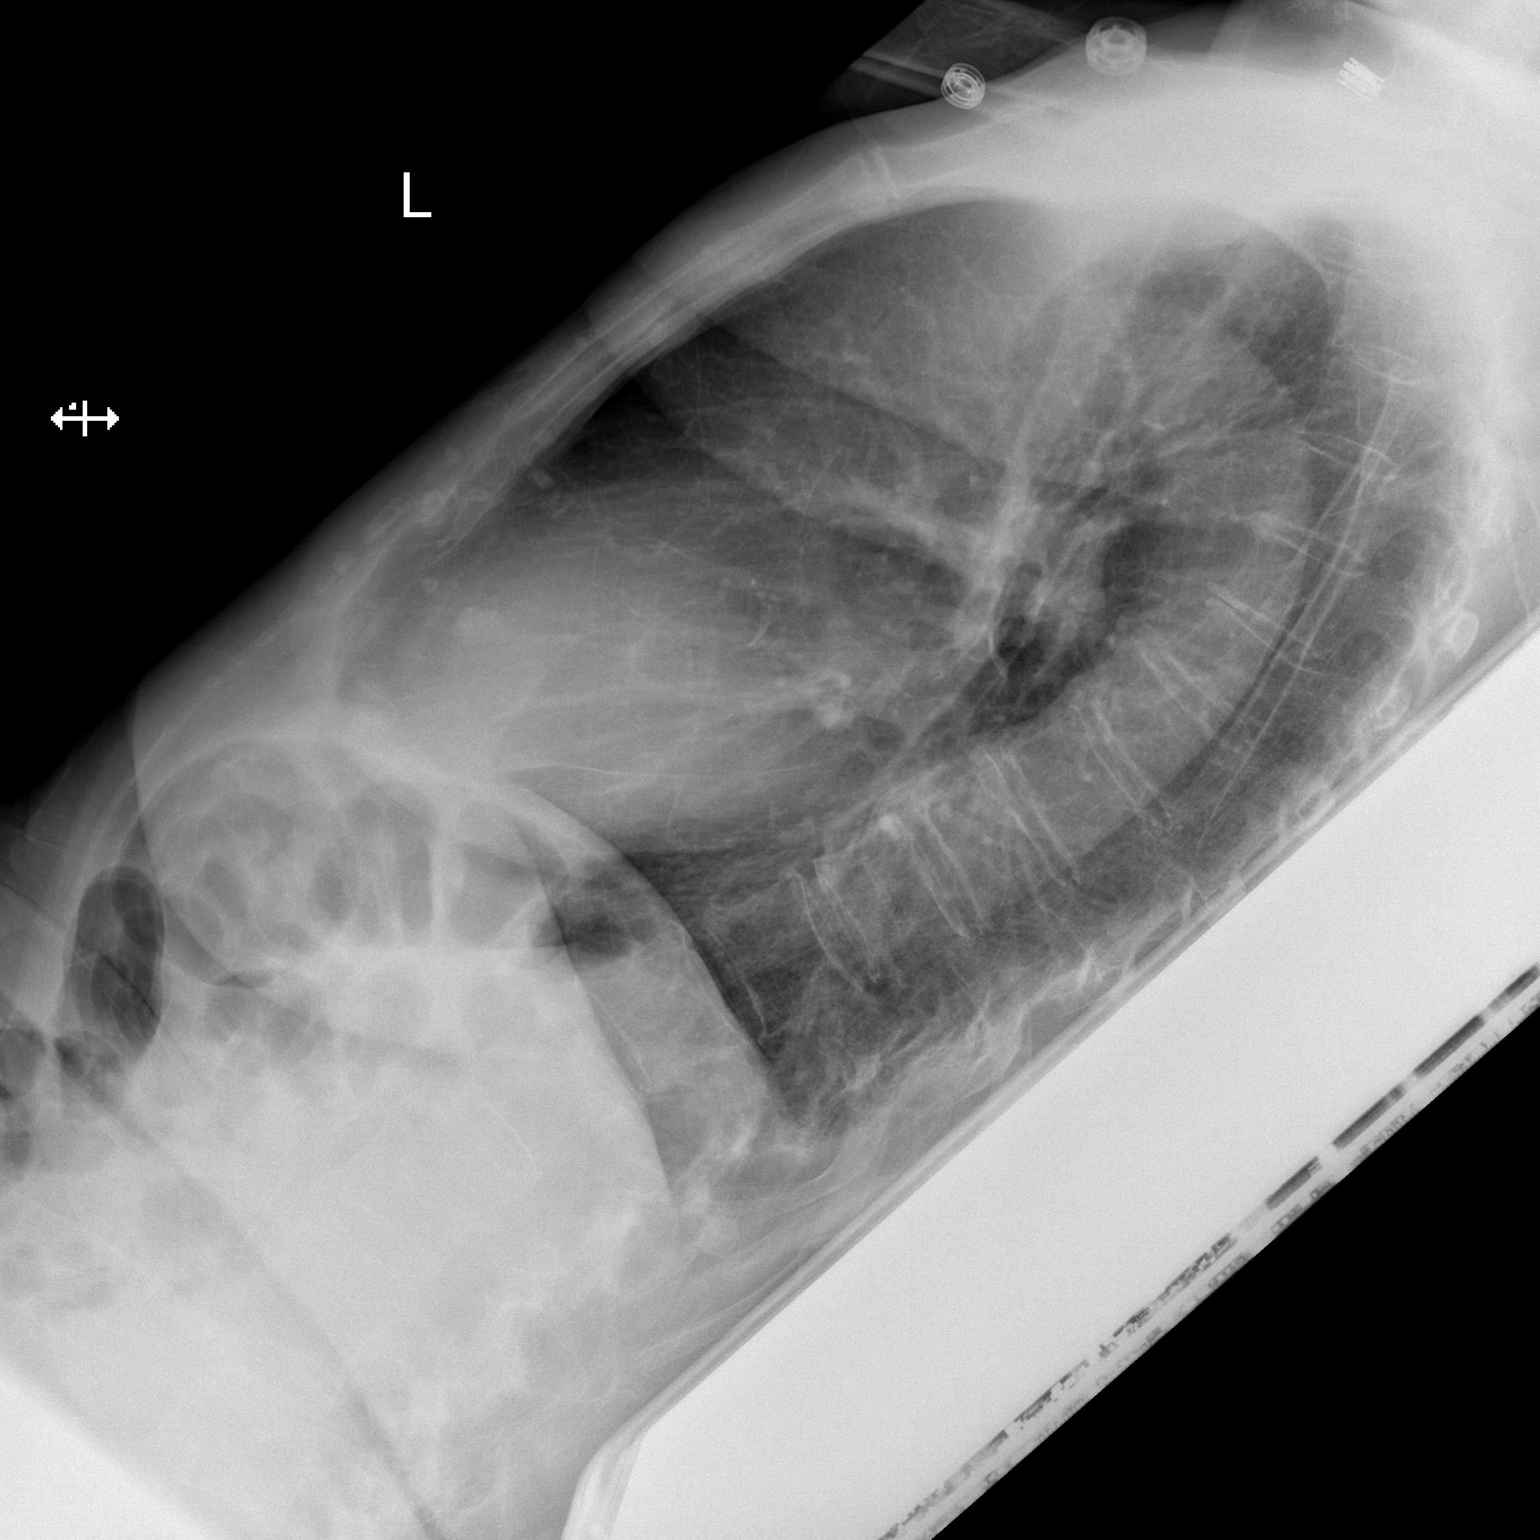

[2 of 2 positions shown; findings below may reference images not displayed]

FINDINGS: Linear scarring at the medial right base. No consolidation or
effusion. Normal cardiomediastinal silhouette with aortic
atherosclerosis. No pneumothorax.
IMPRESSION: No active cardiopulmonary disease.  Scarring at the right base.

## 2020-10-08 IMAGING — CR DG SHOULDER 2+V*L*
3 series · 3 of 3 positions shown · non-contrast
Comparison: CT abdomen and pelvis [DATE].

CLINICAL DATA: Fall.

EXAM:
LEFT SHOULDER - 2+ VIEW; PELVIS - 1-2 VIEW; LEFT ELBOW - COMPLETE 3+
VIEW

[x shoulder ap left (1 of 3)]
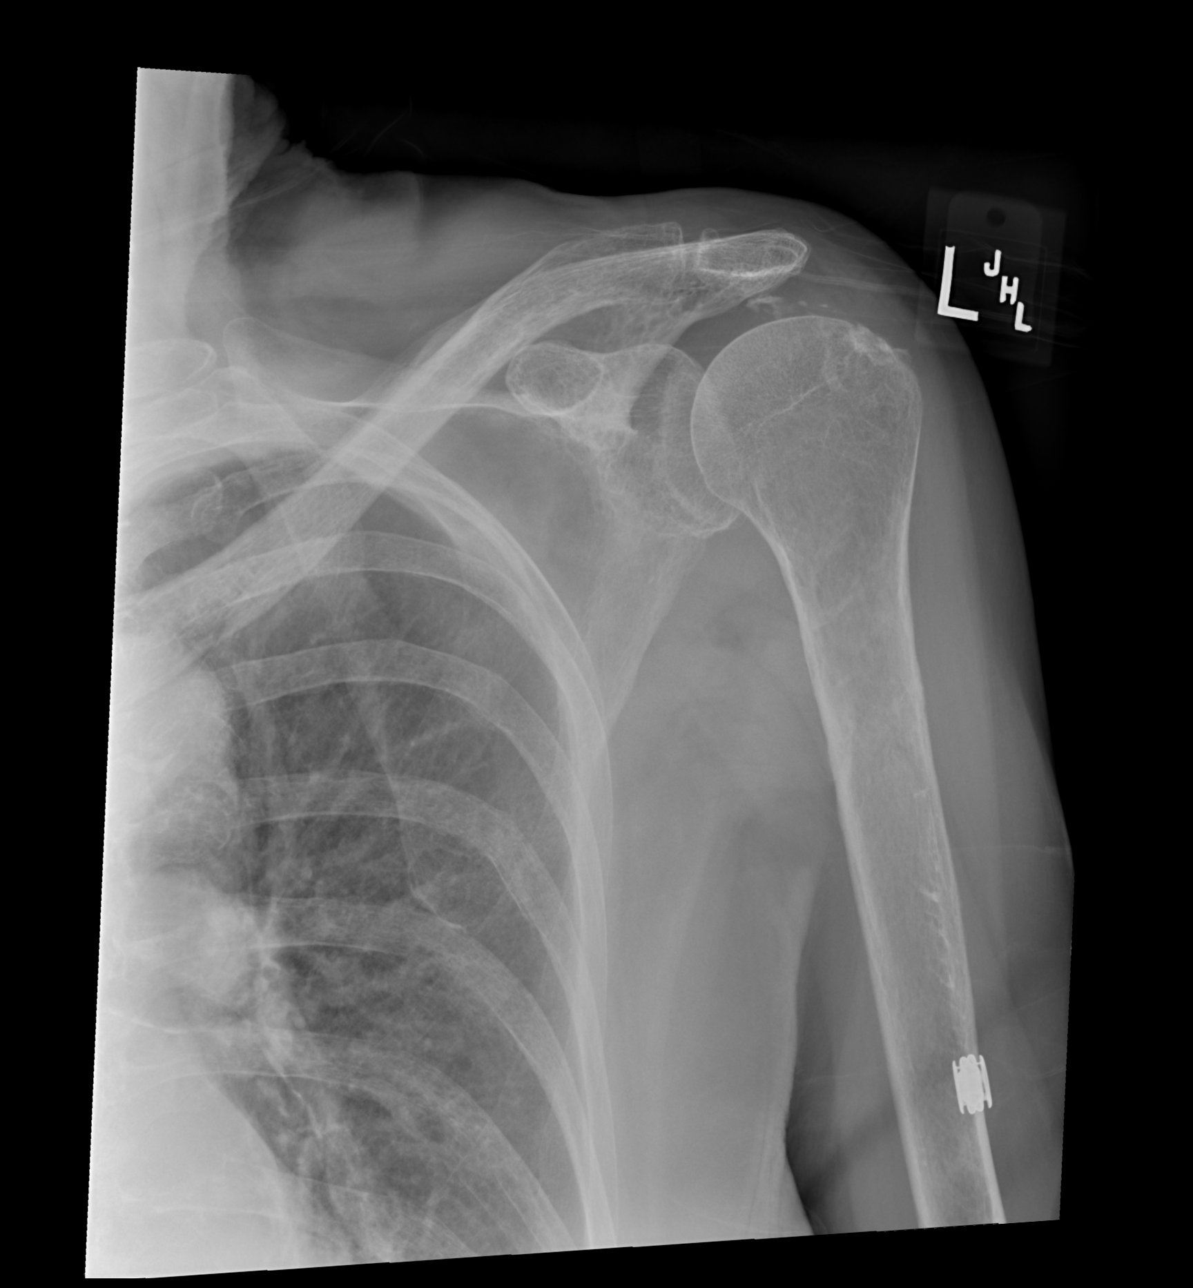

[x shoulder ap left (2 of 3)]
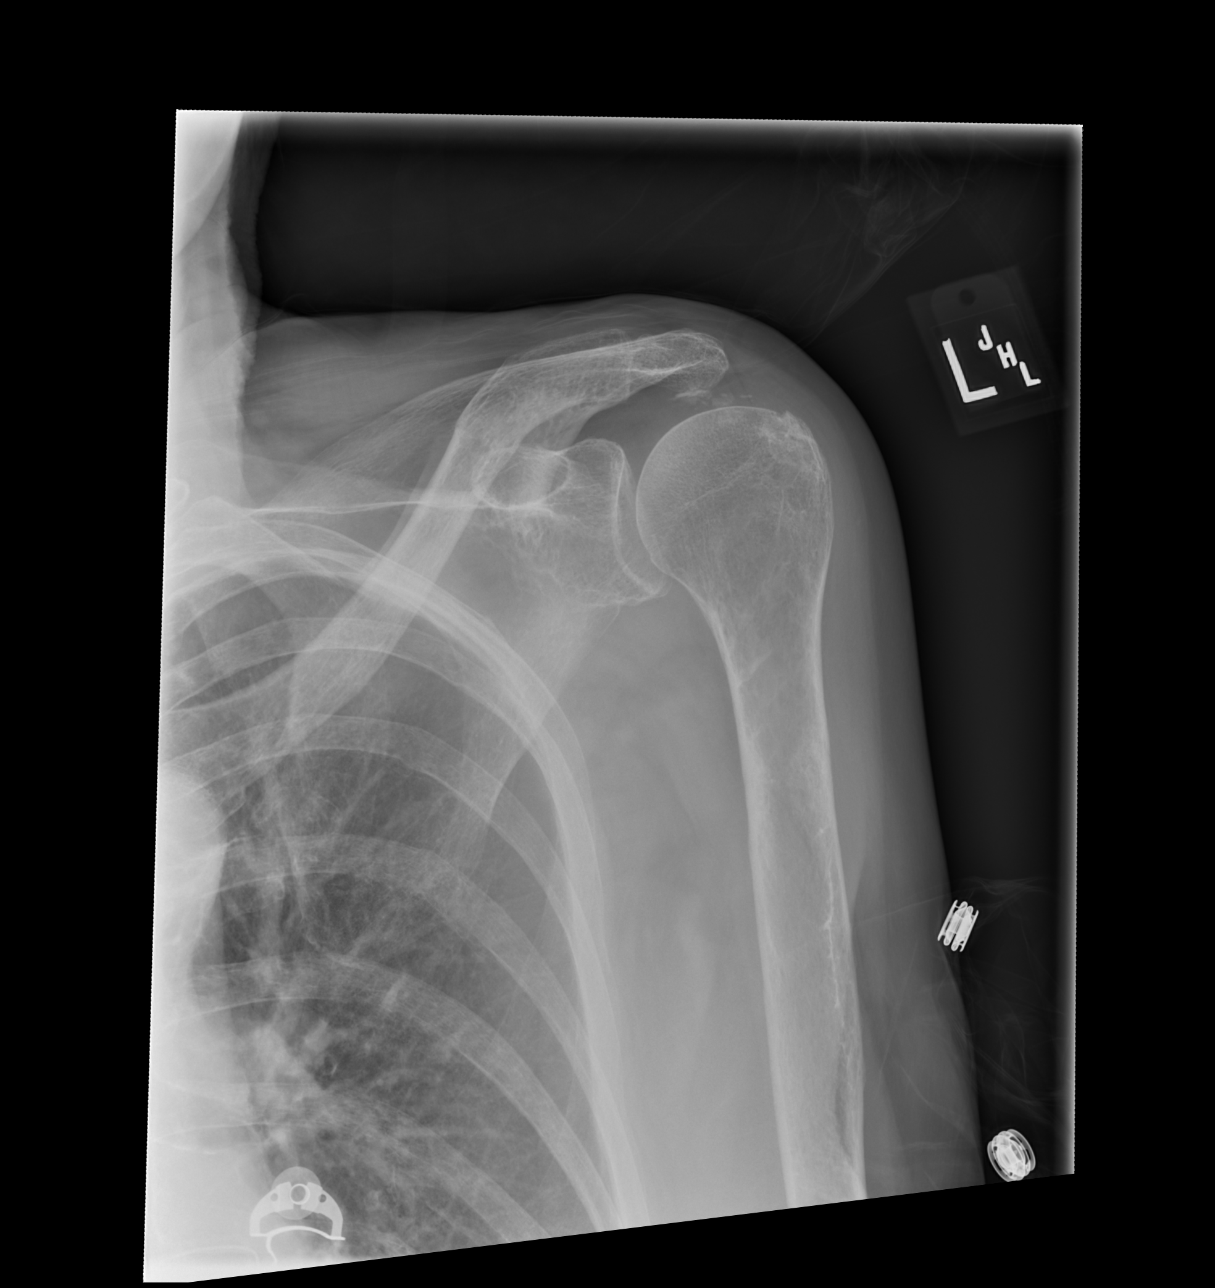

[x shoulder ap left (3 of 3)]
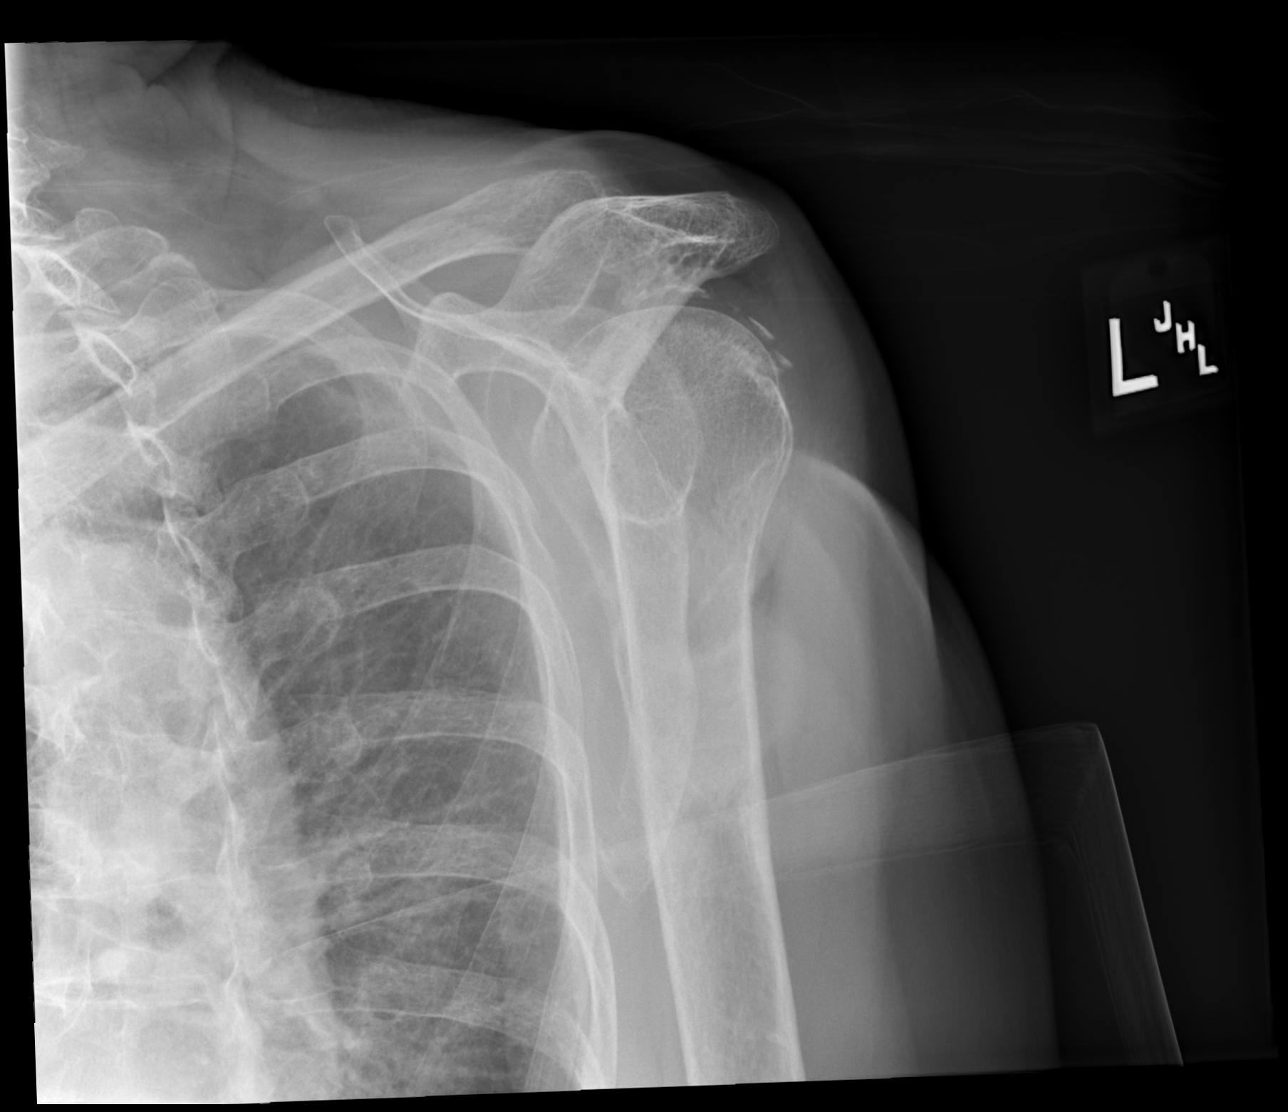

[3 of 3 positions shown; findings below may reference images not displayed]

FINDINGS: Pelvis: There is no evidence of acute fracture or dislocation.
Healed left inferior pubic ramus fracture is unchanged. There are
moderate severe degenerative changes of the right hip, unchanged.
Soft tissues are unremarkable.

Left shoulder: No acute fracture or dislocation. Joint spaces are
maintained. There are soft tissue calcifications superior to the
humeral head likely related to calcific tendinitis. There are
minimal degenerative changes of the acromioclavicular joint.

Left elbow: No acute fracture or dislocation. Joint spaces are
maintained. Soft tissues are within normal limits. Antecubital IV is
present.
IMPRESSION: 1. No acute fracture of the pelvis, left shoulder or left elbow.

## 2020-10-08 IMAGING — CR DG THORACIC SPINE 2V
3 series · 3 of 3 positions shown · non-contrast
Comparison: MRI [DATE]

CLINICAL DATA: Fall

EXAM:
THORACIC SPINE 2 VIEWS

[t thoracic spine ap]
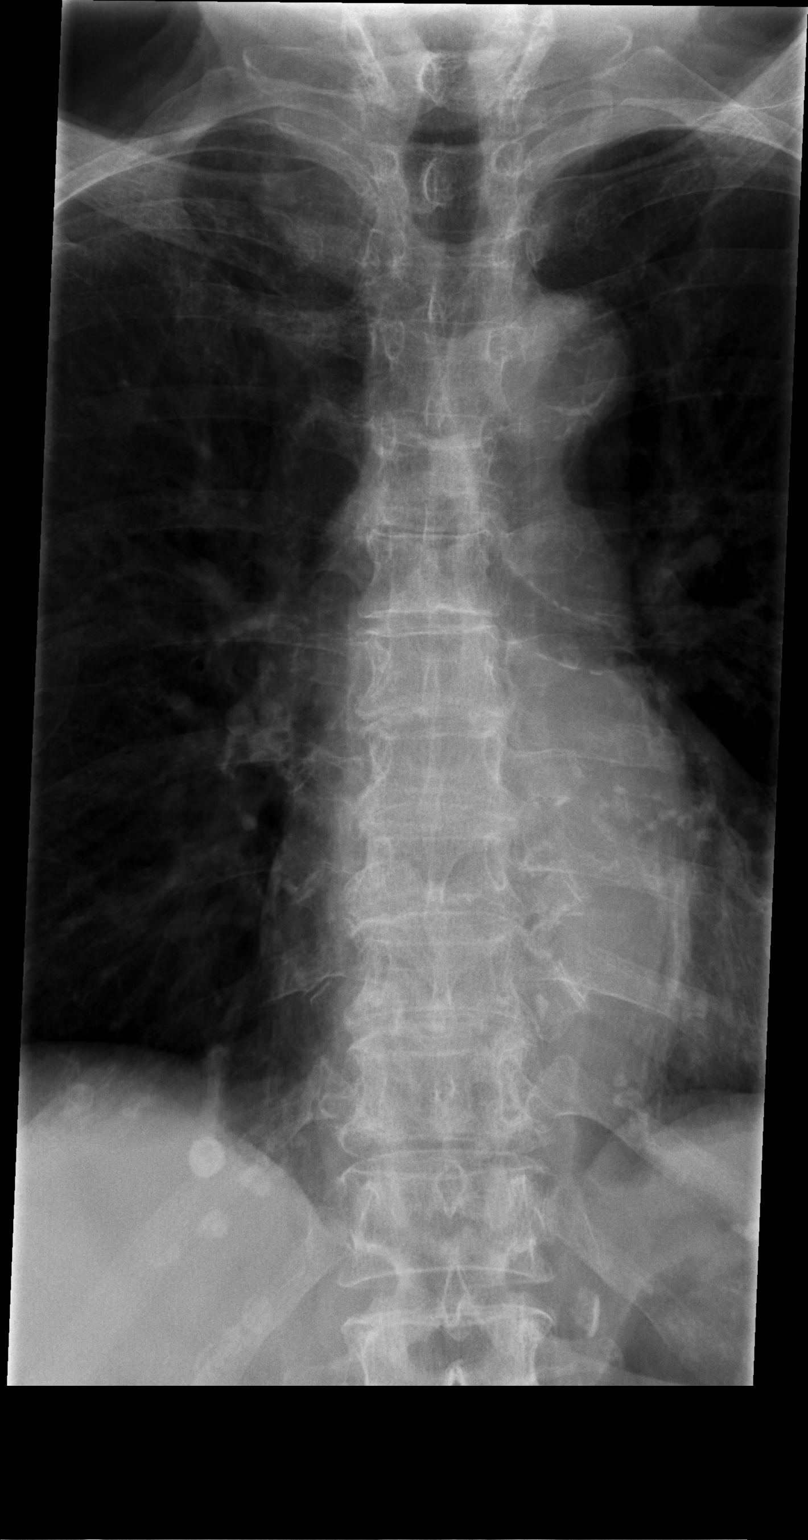

[t thoracic spine lat]
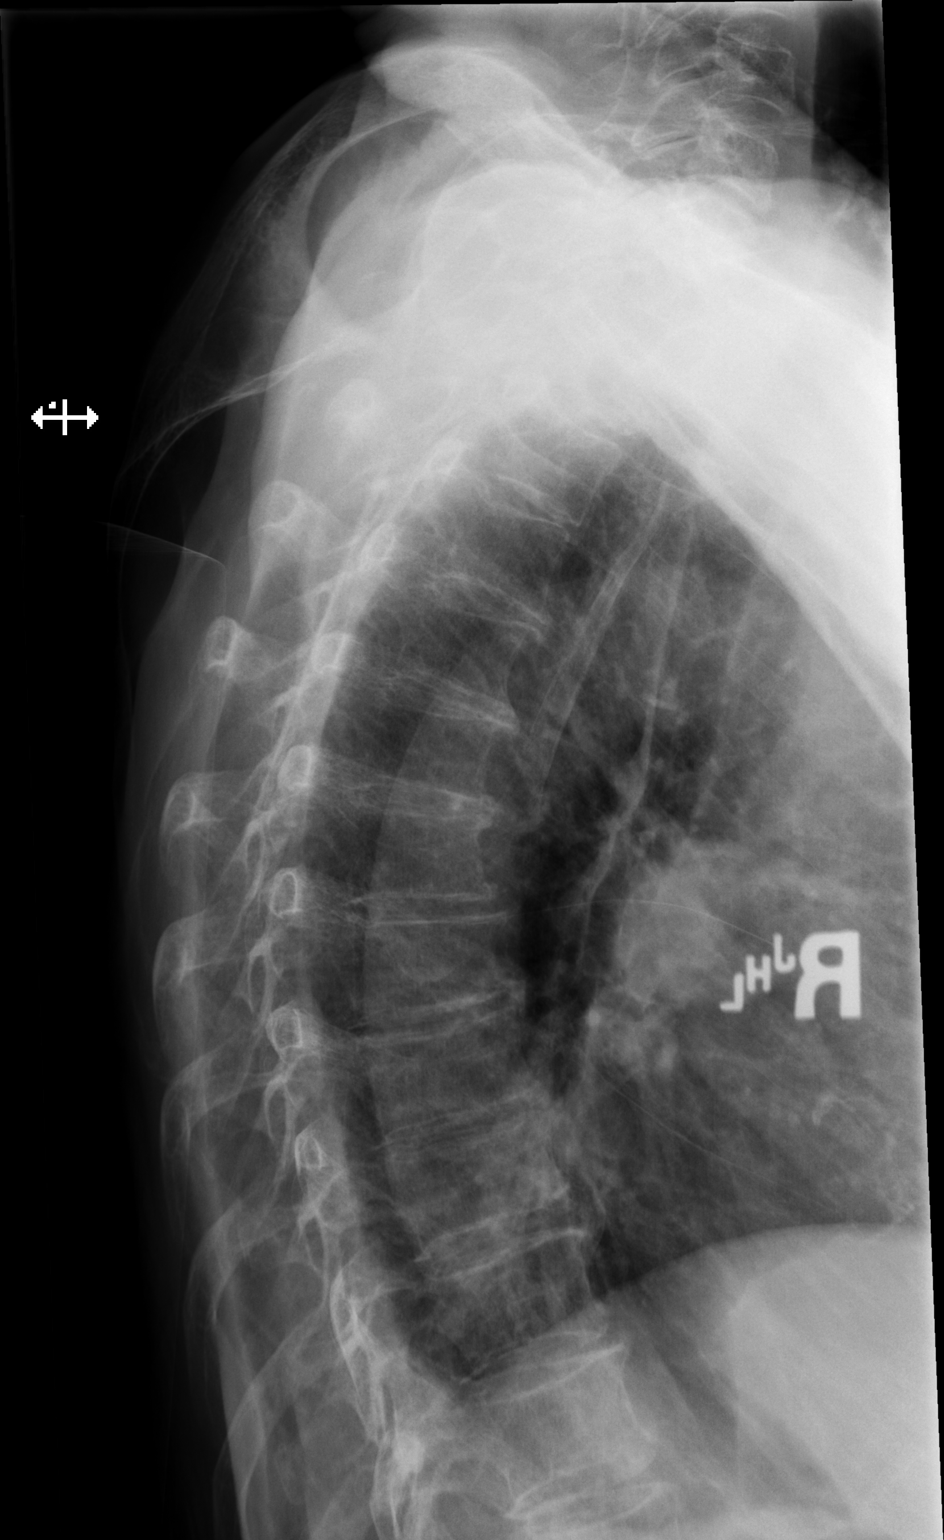

[t thoracic swimmers]
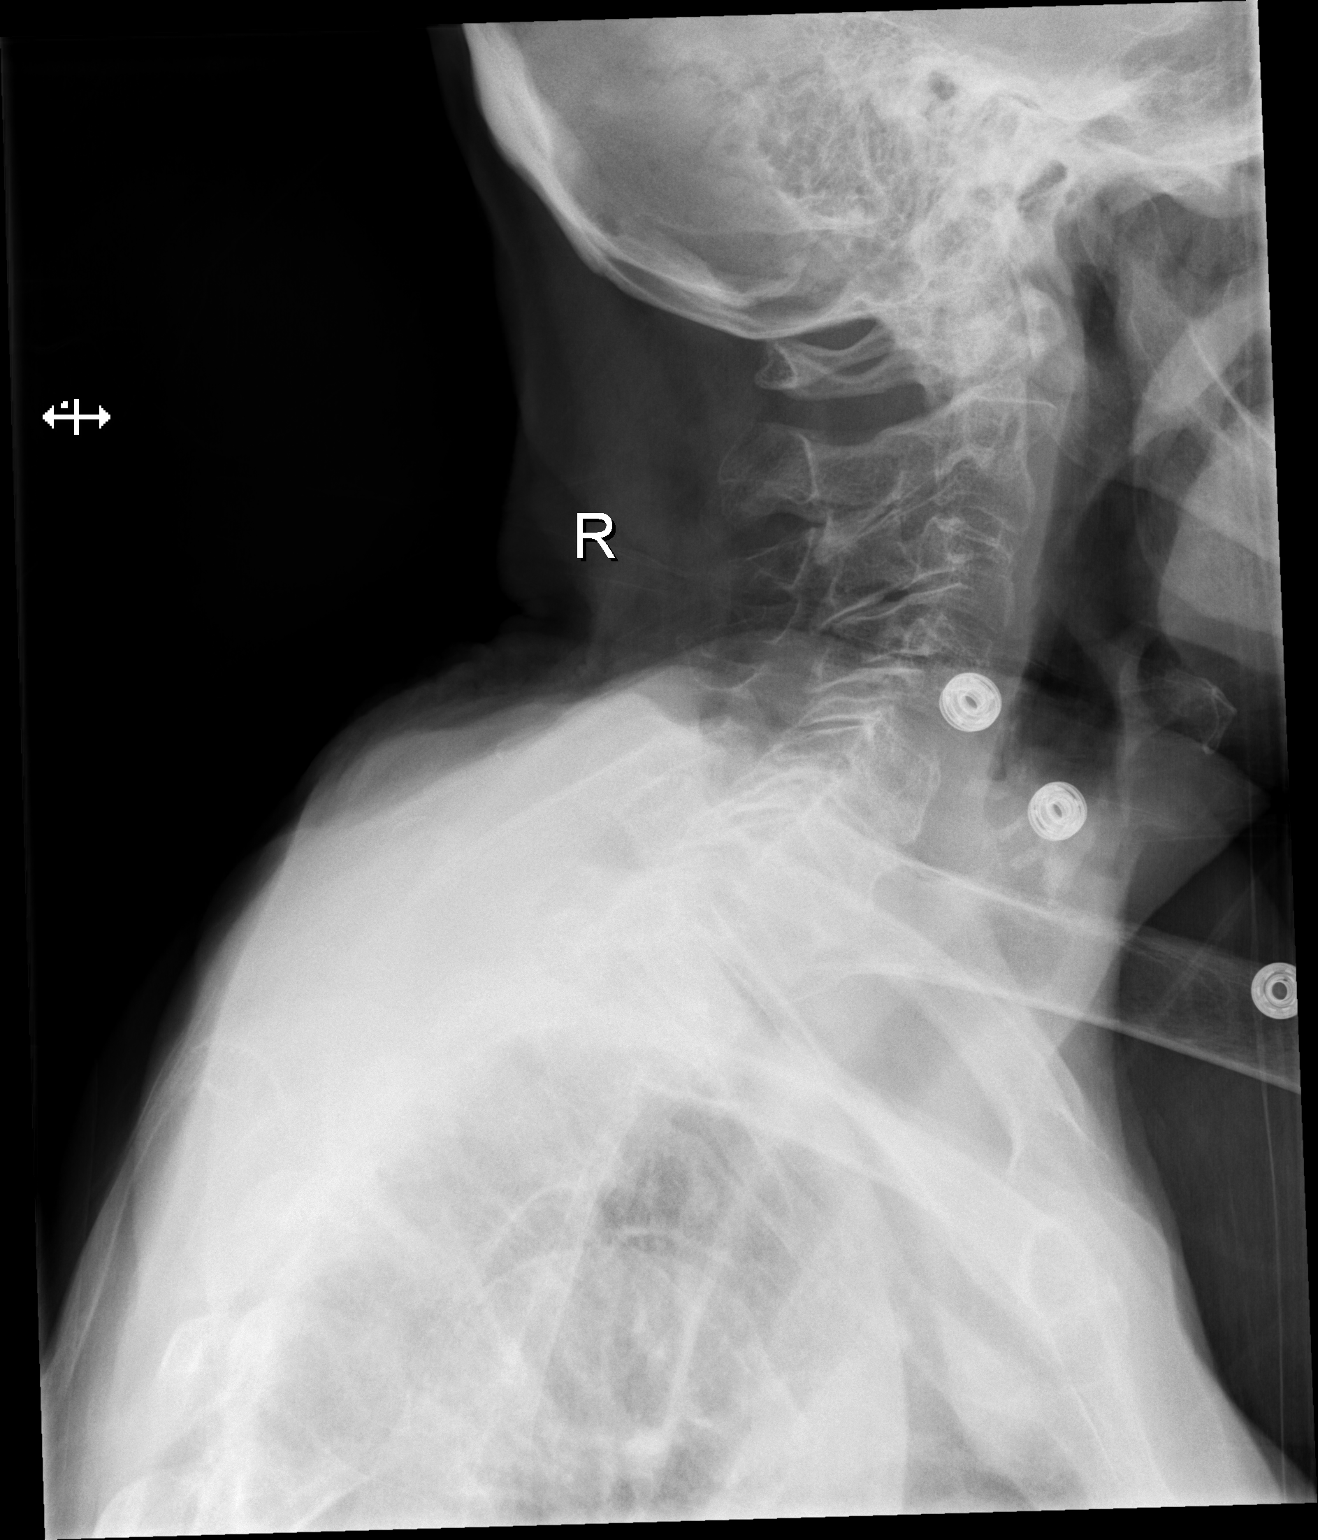

[3 of 3 positions shown; findings below may reference images not displayed]

FINDINGS: Alignment within normal limits. Vertebral body heights are
maintained. Multilevel degenerative osteophytes.
IMPRESSION: No acute osseous abnormality

## 2020-10-08 IMAGING — CT CT CERVICAL SPINE W/O CM
3 of 4 series · 13 of 33 positions shown, 16 images · non-contrast
Comparison: MRI head [DATE].

CLINICAL DATA: Fall.

EXAM:
CT HEAD WITHOUT CONTRAST
CT CERVICAL SPINE WITHOUT CONTRAST
TECHNIQUE: Multidetector CT imaging of the head and cervical spine was
performed following the standard protocol without intravenous
contrast. Multiplanar CT image reconstructions of the cervical spine
were also generated.

[Series 4: orthogonal bone · axial · 0.23mm/px · z∈[-274,-158]mm · 5 of 95 slices shown, 7 images]
[im 14/95  soft-tissue]
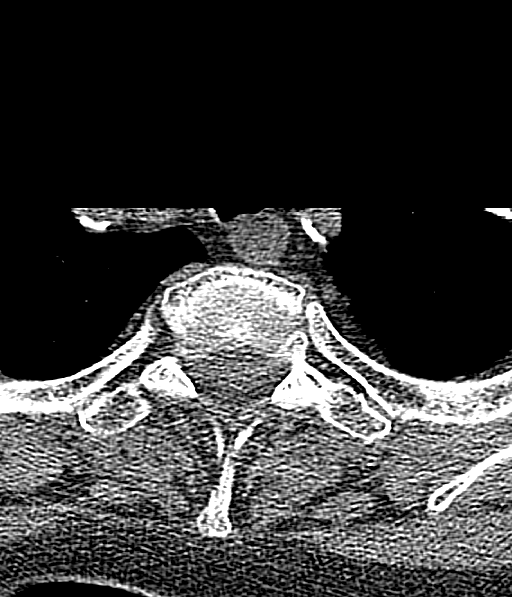
[im 14/95  bone]
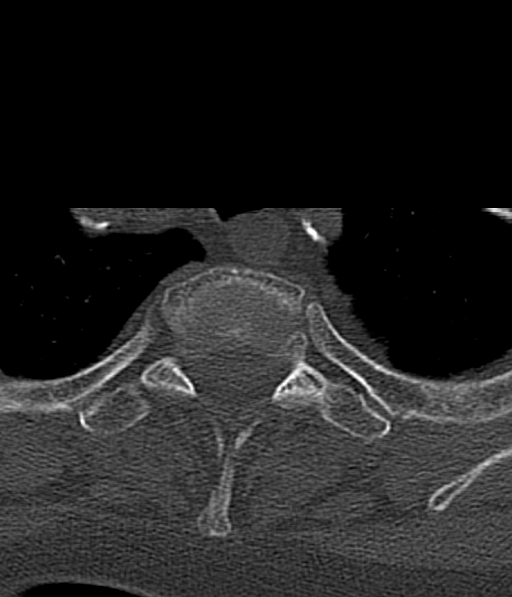
[im 27/95  bone]
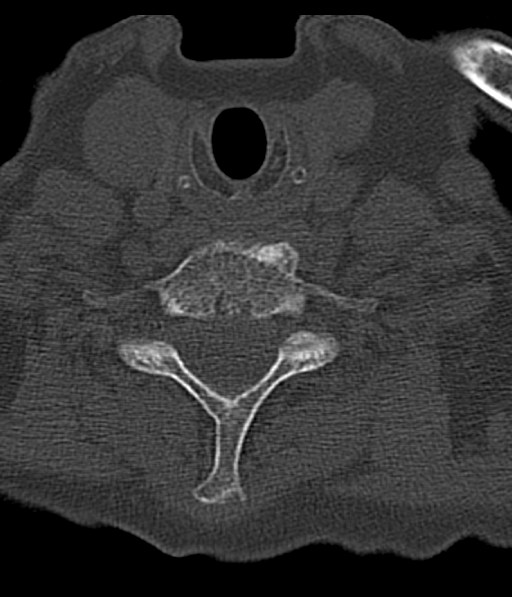
[im 54/95  bone]
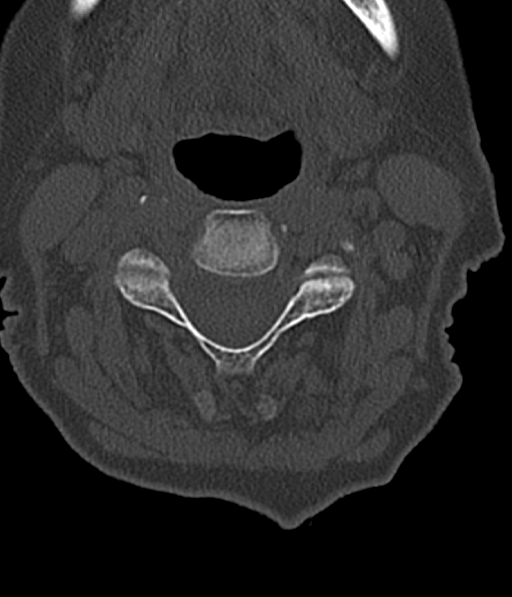
[im 68/95  bone]
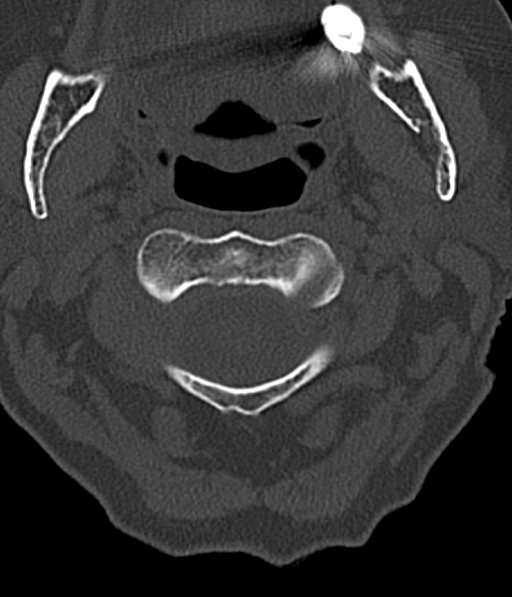
[im 81/95  soft-tissue]
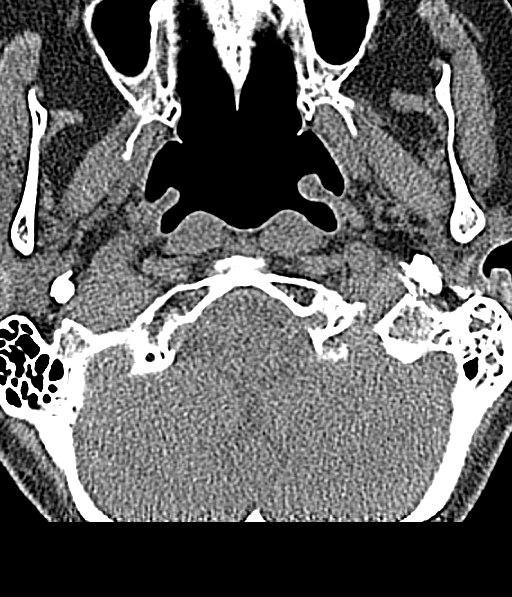
[im 81/95  bone]
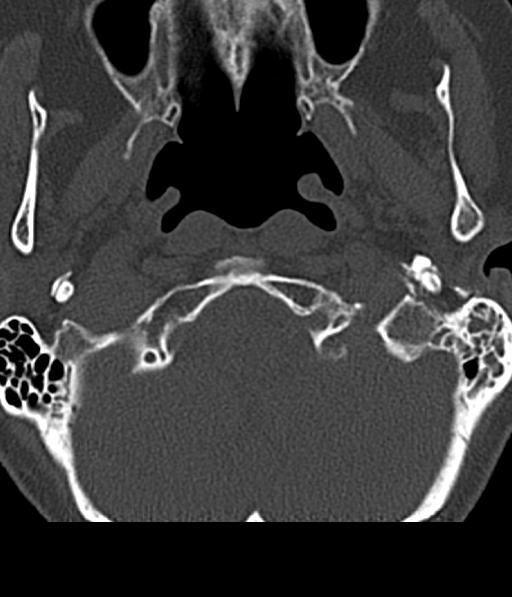

[Series 5: coronal bone · coronal · 0.30mm/px · 3 of 61 slices shown]
[im 14/61  bone]
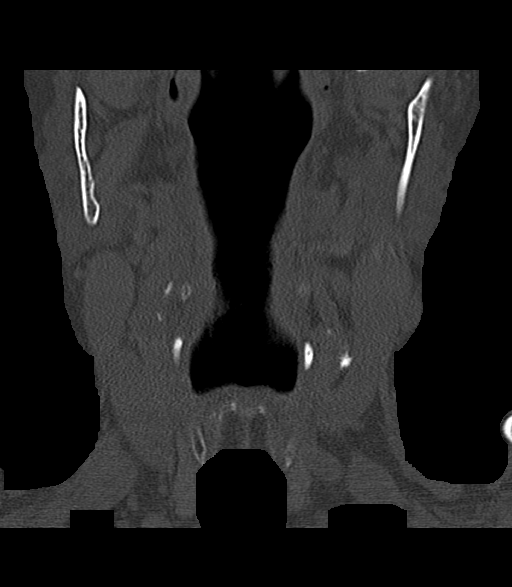
[im 25/61  bone]
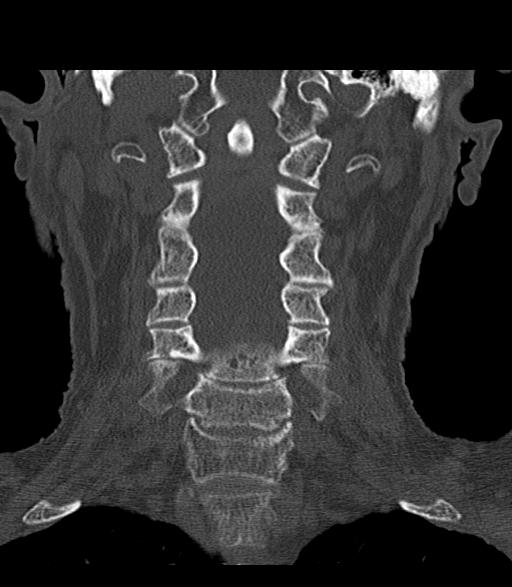
[im 36/61  bone]
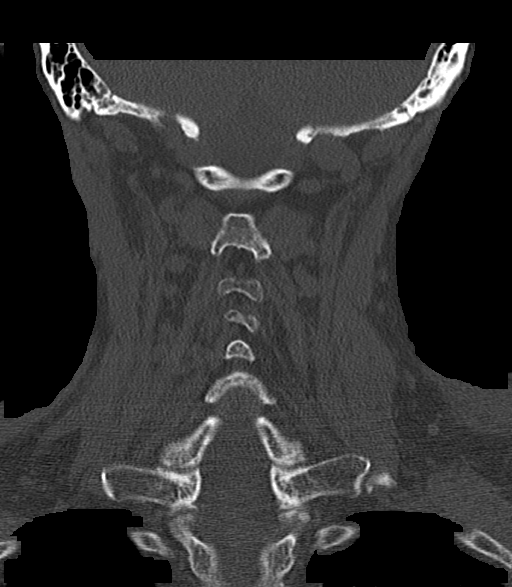

[Series 6: sagittal bone · sagittal · 0.31mm/px · 5 of 61 slices shown, 6 images]
[im 21/61  bone]
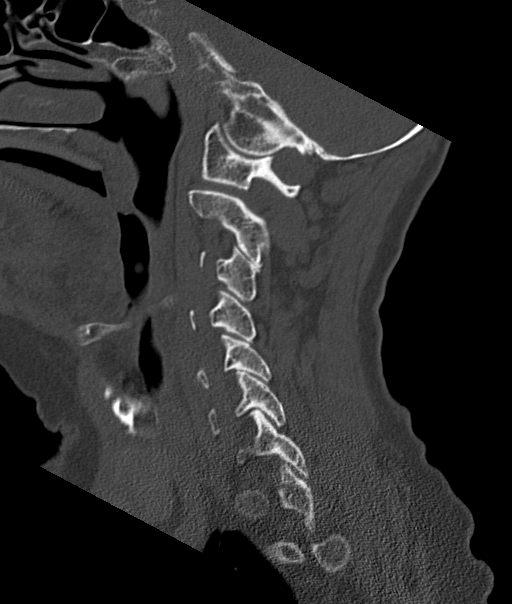
[im 26/61  bone]
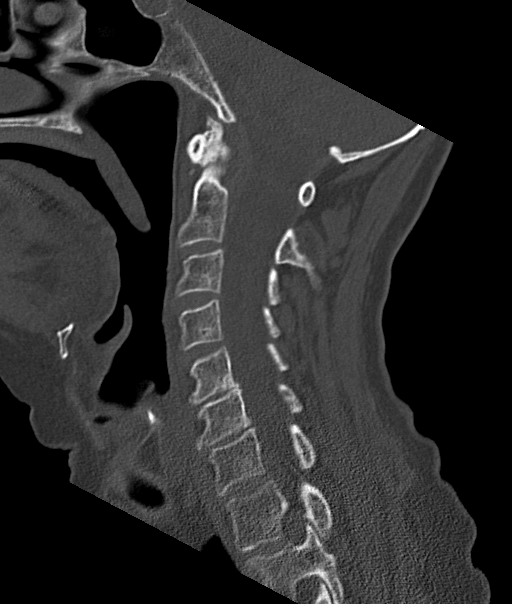
[im 31/61  soft-tissue]
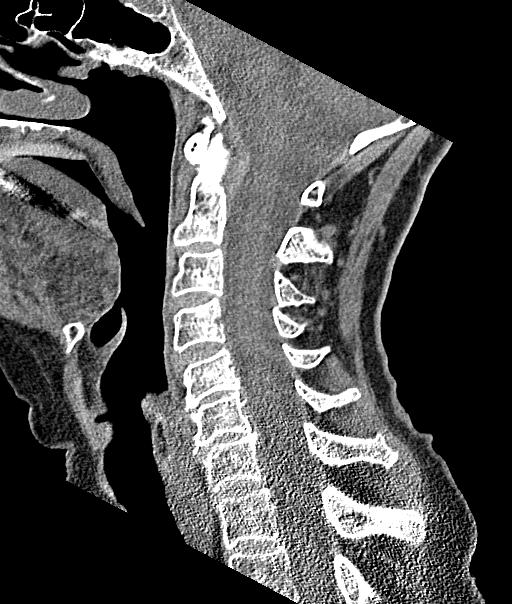
[im 31/61  bone]
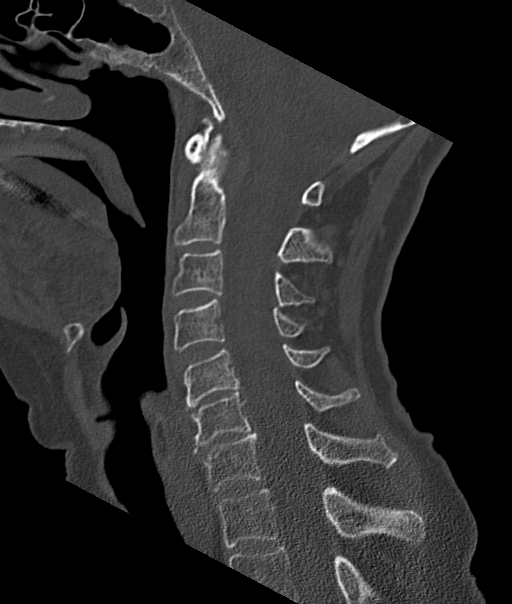
[im 36/61  bone]
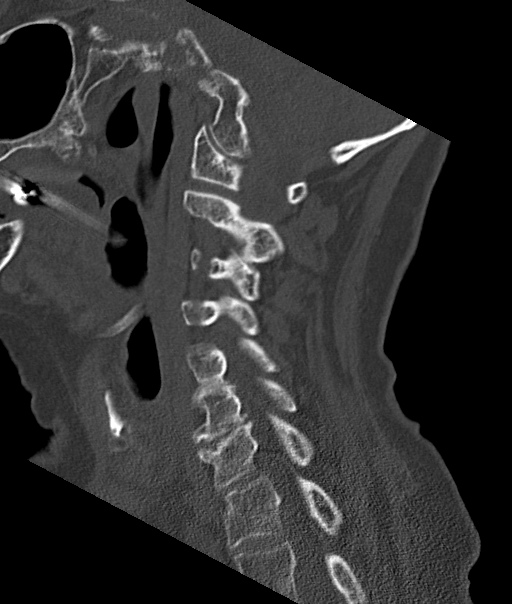
[im 41/61  bone]
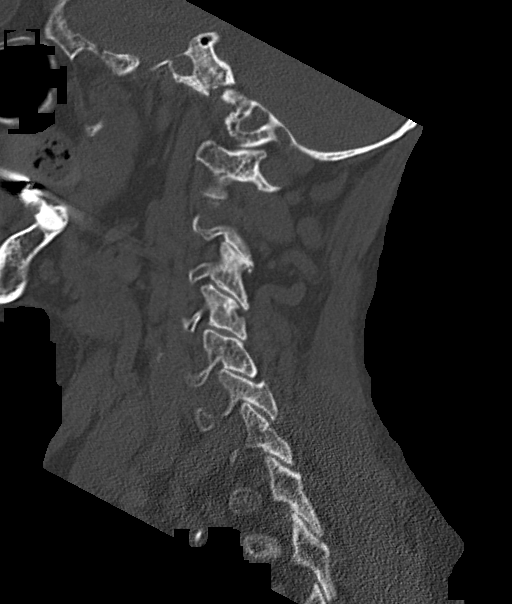

[13 of 33 positions shown; findings below may reference images not displayed]

FINDINGS: CT HEAD FINDINGS

Brain: No evidence of acute infarction, hemorrhage, hydrocephalus,
extra-axial collection or mass lesion/mass effect. There is mild
periventricular white matter hypodensity, likely chronic small
vessel ischemic change.

Vascular: No hyperdense vessel or unexpected calcification.

Skull: Normal. Negative for fracture or focal lesion.

Sinuses/Orbits: No acute finding.

Other: There is lateral left temporoparietal scalp soft tissue
swelling. Partial left mastoid effusion.

CT CERVICAL SPINE FINDINGS

Alignment: Normal.

Skull base and vertebrae: No acute fracture. No primary bone lesion
or focal pathologic process.

Soft tissues and spinal canal: No prevertebral fluid or swelling. No
visible canal hematoma.

Disc levels: There is mild disc space narrowing at C5-C6 and C6-C7
compatible with degenerative change. No significant central canal or
neural foraminal stenosis at any level.

Upper chest: Negative.

Other: None.
IMPRESSION: No acute intracranial process.

No acute fracture or traumatic subluxation of the cervical spine.

Left mastoid effusion.

## 2020-10-08 IMAGING — CR DG ELBOW COMPLETE 3+V*L*
4 series · 4 of 4 positions shown · non-contrast
Comparison: CT abdomen and pelvis [DATE].

CLINICAL DATA: Fall.

EXAM:
LEFT SHOULDER - 2+ VIEW; PELVIS - 1-2 VIEW; LEFT ELBOW - COMPLETE 3+
VIEW

[x elbow ap left]
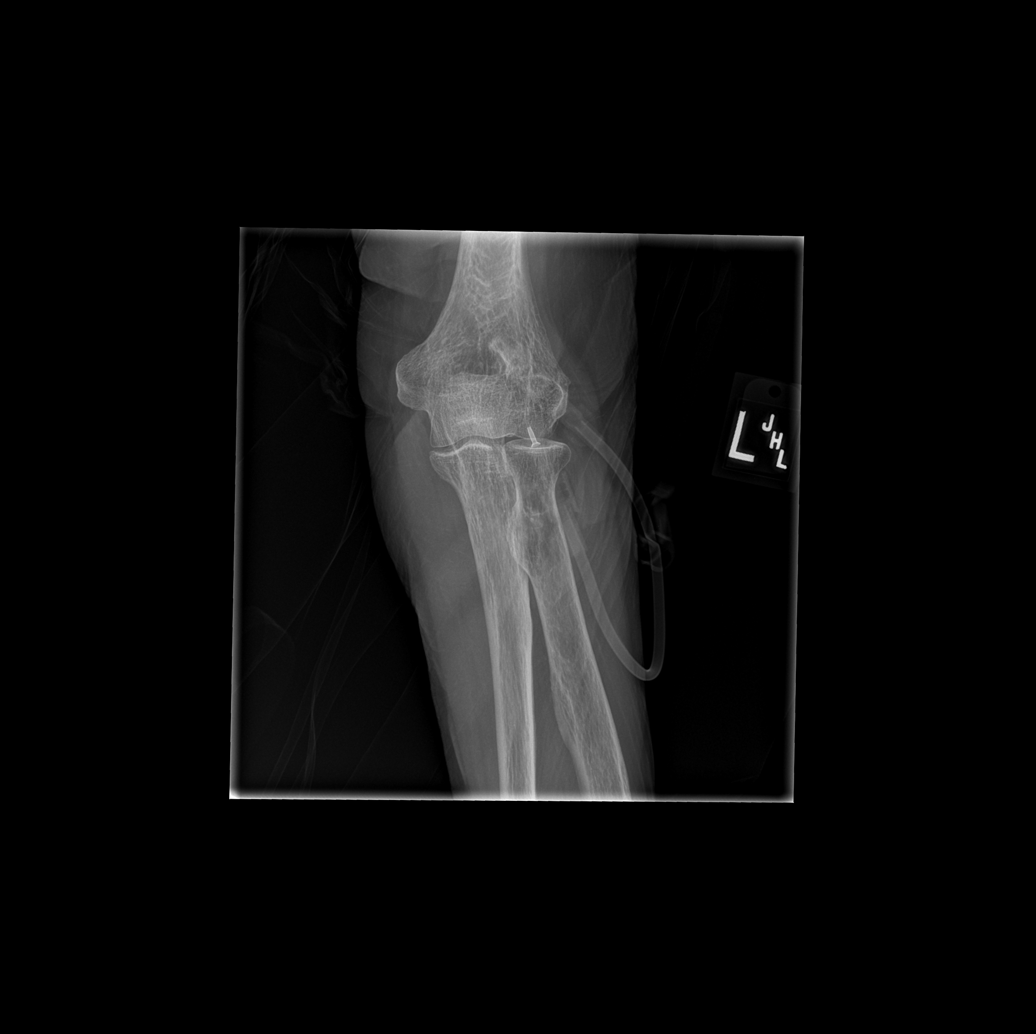

[x elbow obl left (1 of 2)]
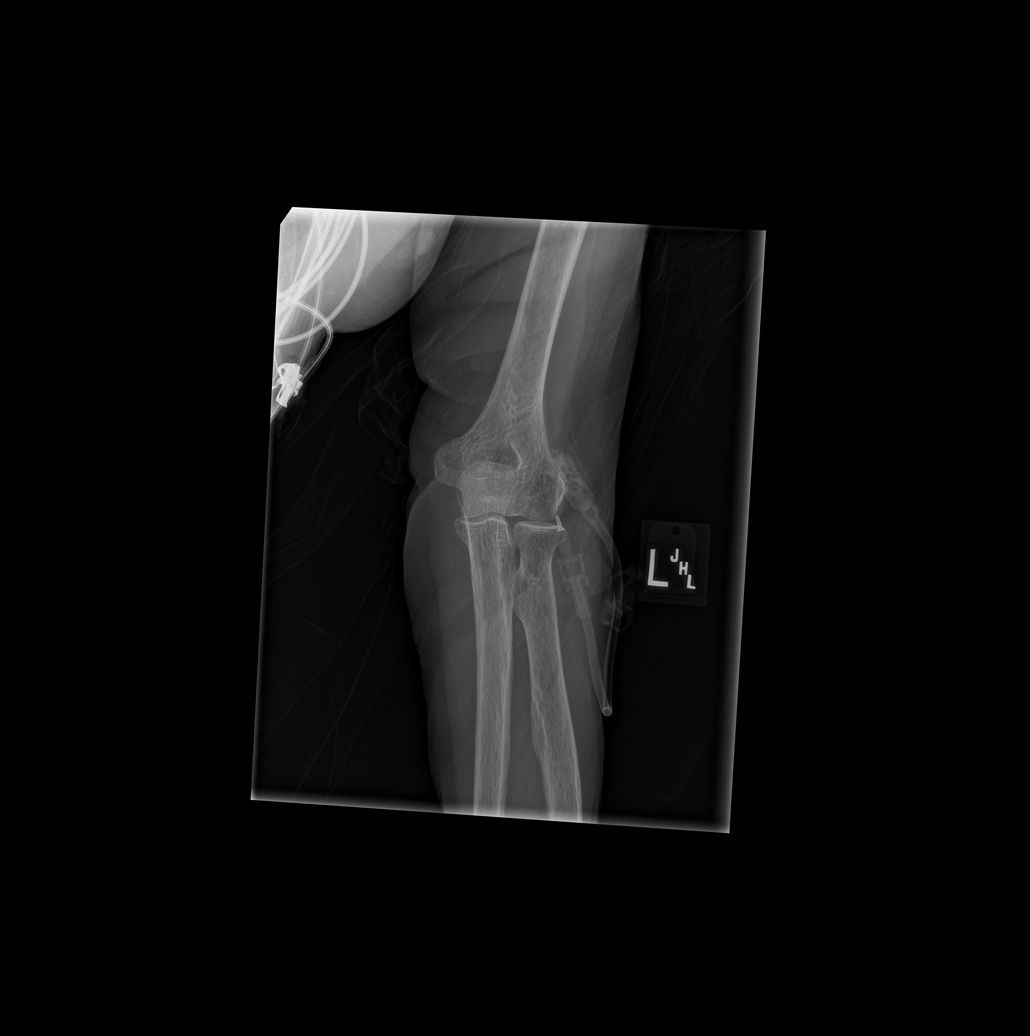

[x elbow obl left (2 of 2)]
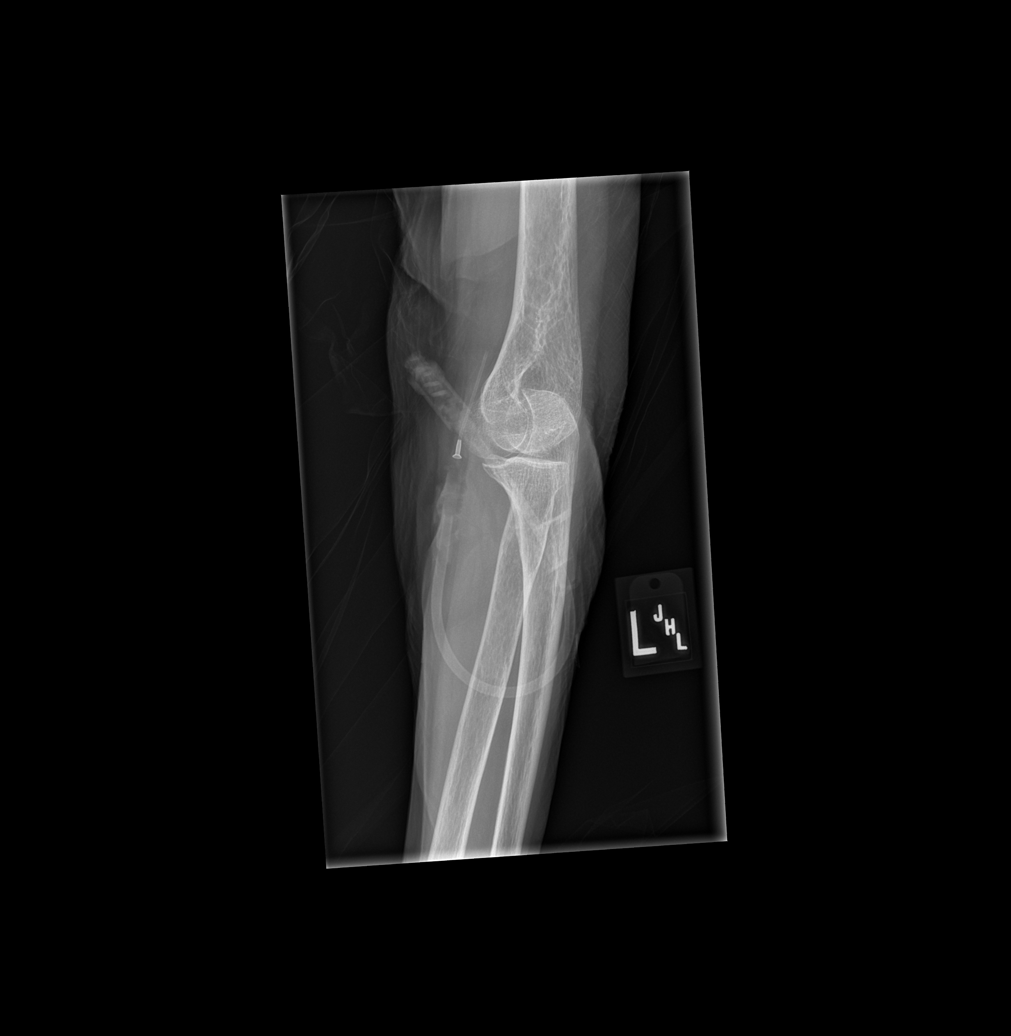

[x elbow lat left]
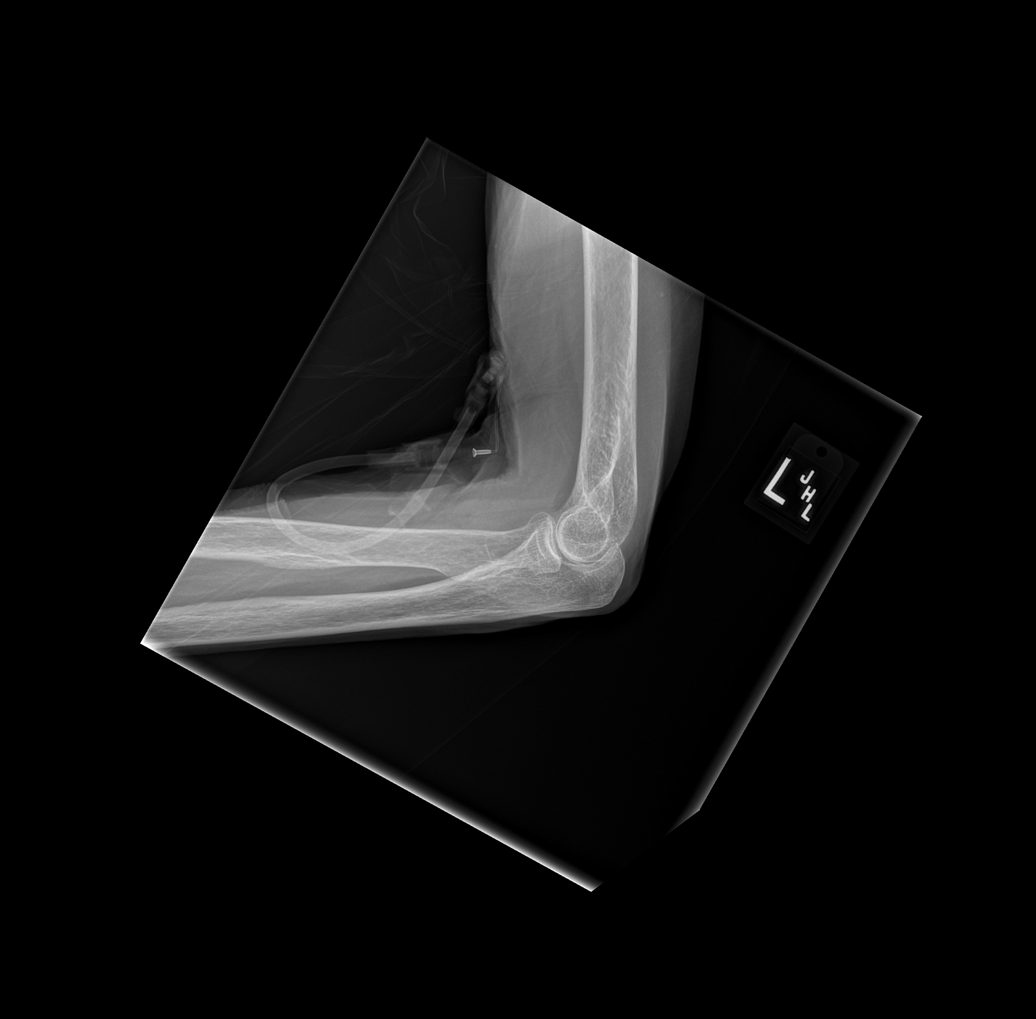

[4 of 4 positions shown; findings below may reference images not displayed]

FINDINGS: Pelvis: There is no evidence of acute fracture or dislocation.
Healed left inferior pubic ramus fracture is unchanged. There are
moderate severe degenerative changes of the right hip, unchanged.
Soft tissues are unremarkable.

Left shoulder: No acute fracture or dislocation. Joint spaces are
maintained. There are soft tissue calcifications superior to the
humeral head likely related to calcific tendinitis. There are
minimal degenerative changes of the acromioclavicular joint.

Left elbow: No acute fracture or dislocation. Joint spaces are
maintained. Soft tissues are within normal limits. Antecubital IV is
present.
IMPRESSION: 1. No acute fracture of the pelvis, left shoulder or left elbow.

## 2020-10-08 IMAGING — CR DG LUMBAR SPINE COMPLETE 4+V
5 series · 5 of 5 positions shown · non-contrast
Comparison: [DATE]

CLINICAL DATA: Fall

EXAM:
LUMBAR SPINE - COMPLETE 4+ VIEW

[t lumbar spine ap]
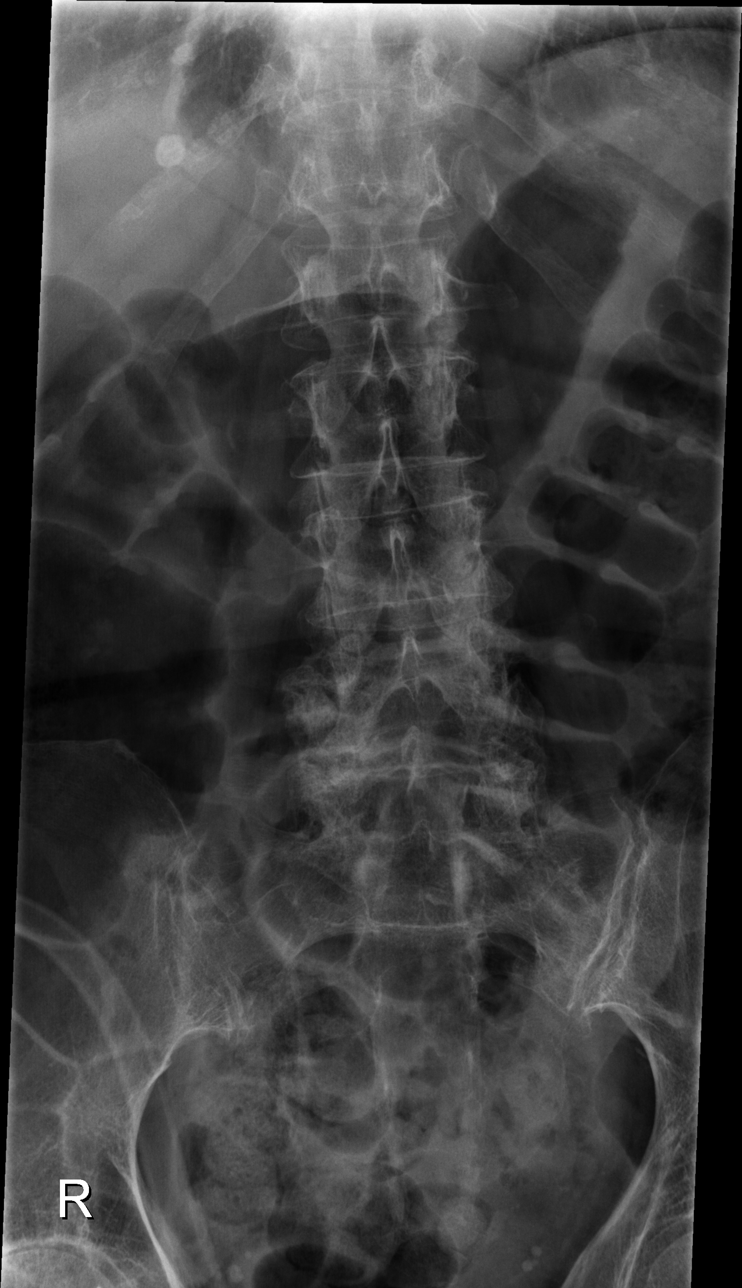

[t lumbar spine obl (1 of 2)]
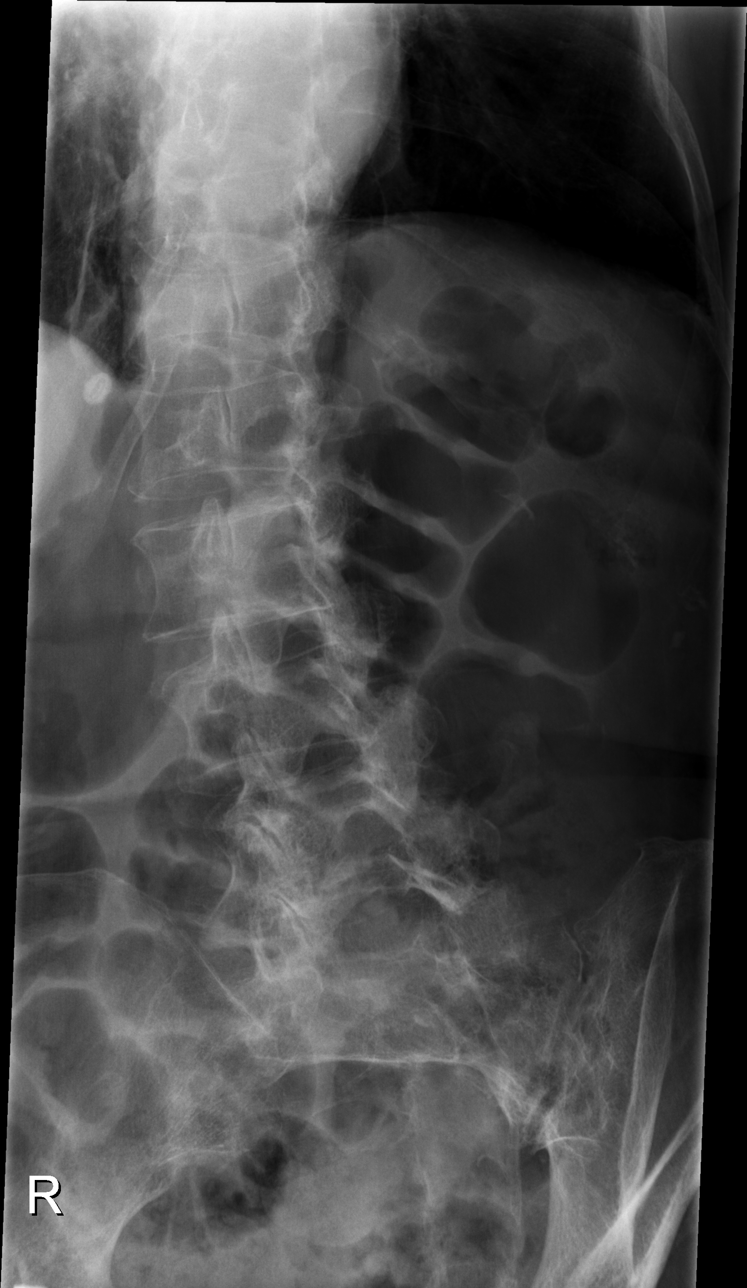

[t lumbar spine obl (2 of 2)]
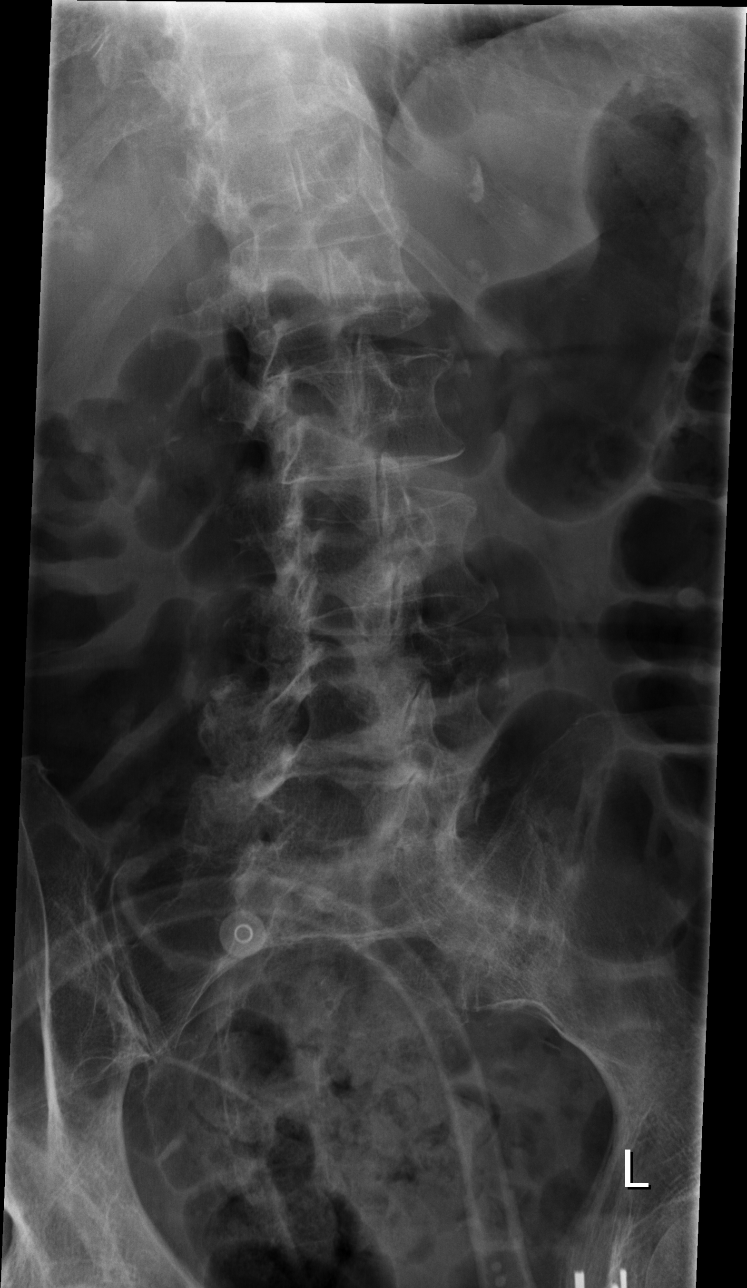

[t lumbar spine lat]
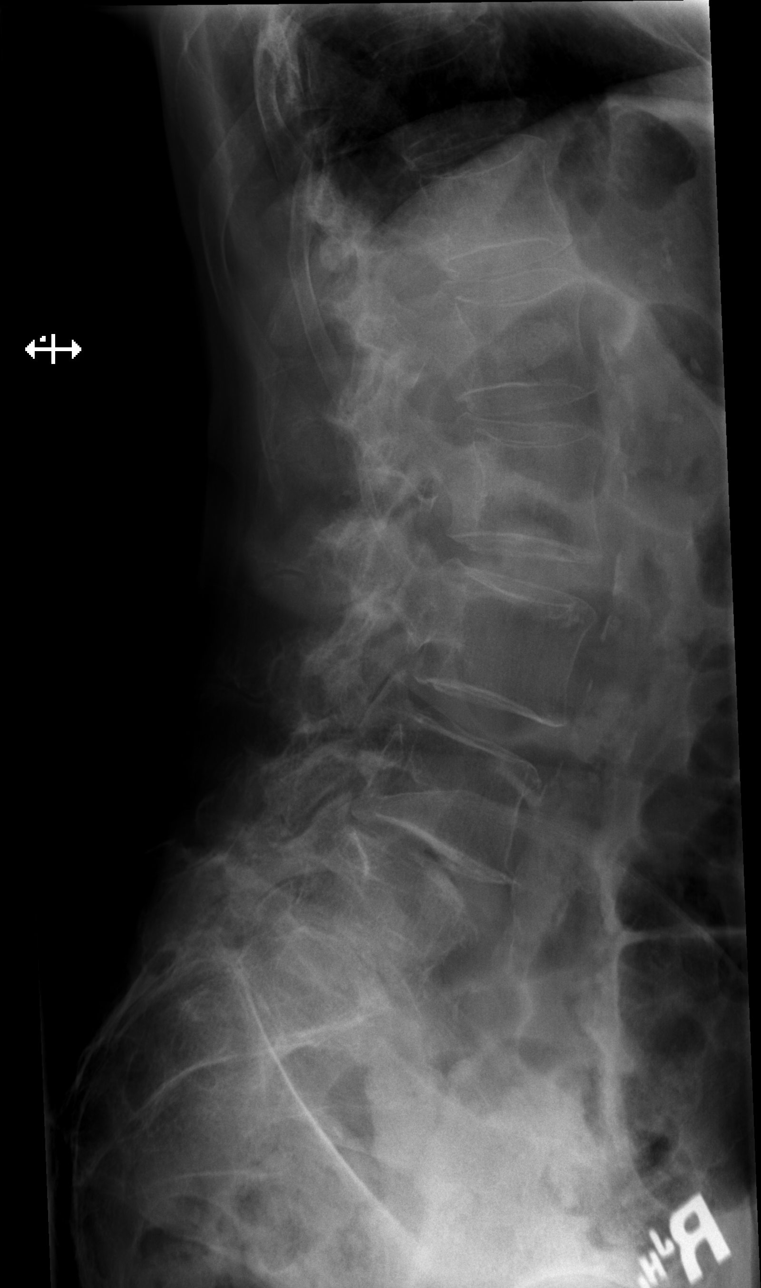

[t lumbar l-5 s-1 spot]
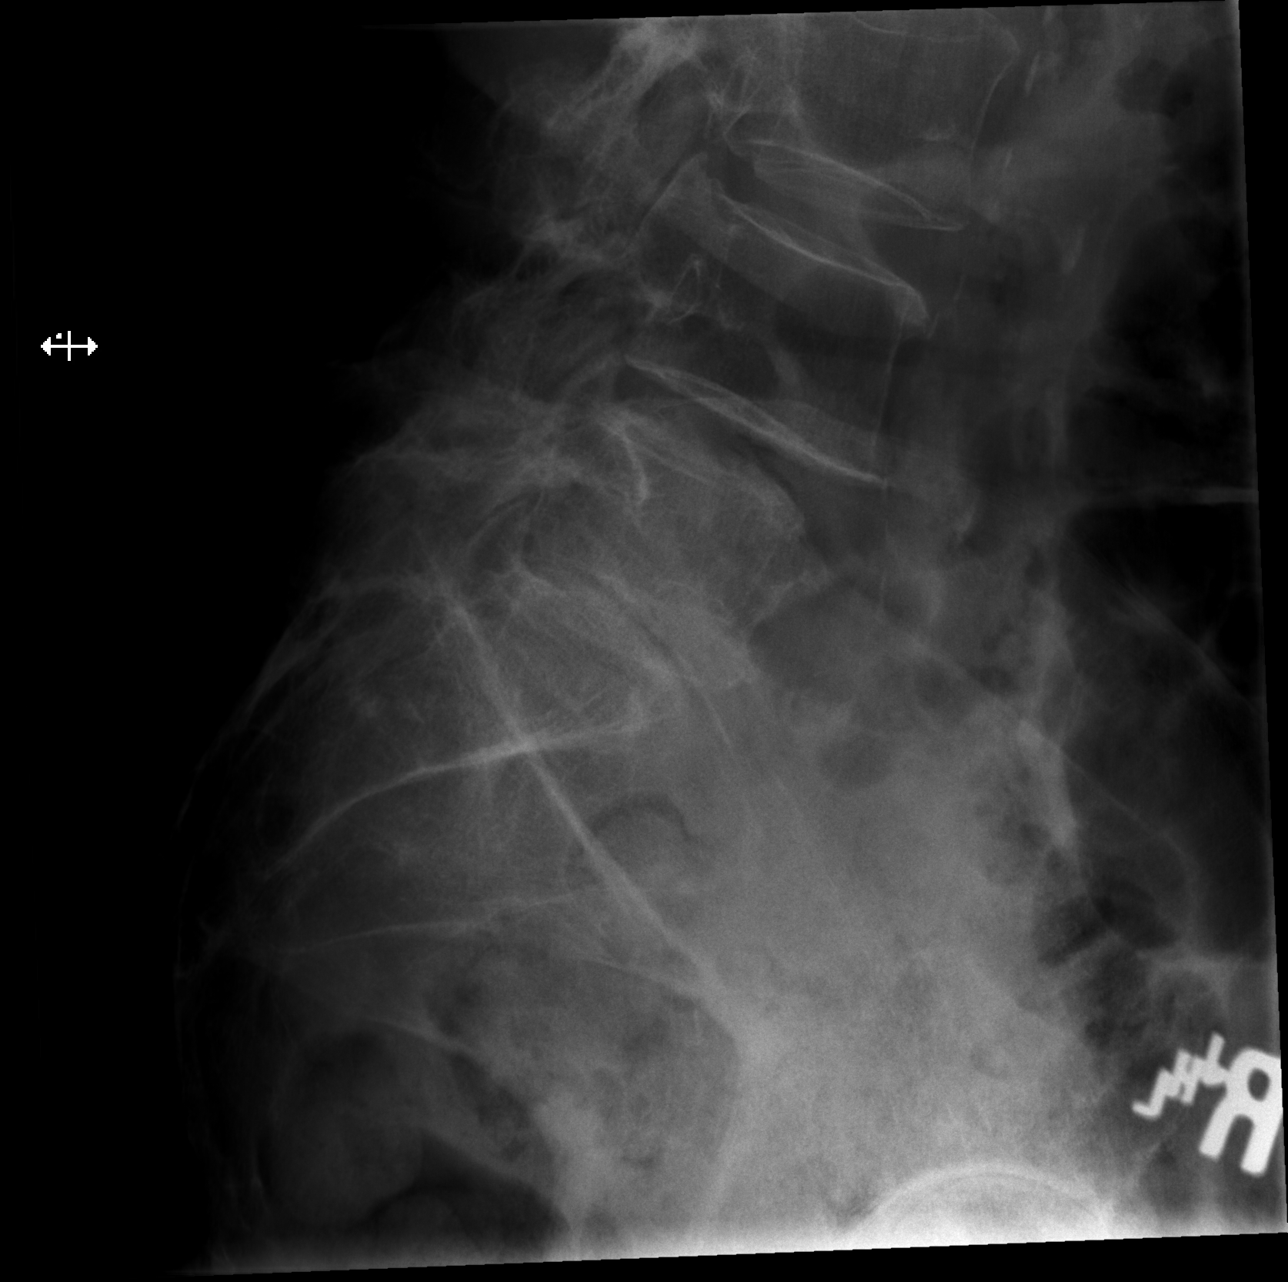

[5 of 5 positions shown; findings below may reference images not displayed]

FINDINGS: Lumbar alignment within normal limits. Vertebral body heights are
maintained. Advanced disc space narrowing and degenerative change at
L5-S1. Moderate disc space narrowing at L4-L5. Facet degenerative
changes at multiple levels. Aortic atherosclerosis
IMPRESSION: 1. No acute osseous abnormality
2. Multilevel degenerative change, most advanced at L5-S1

## 2020-10-08 NOTE — ED Triage Notes (Signed)
Pt BIB EMS from home after sustaining an unwitnessed fall, unknown LOC. No obvious deformities noted although pt reports pain to generalized spine, left elbow, left upper shoulder, and left hip. Hematoma noted to left posterior head, no blood thinners reported. Pt had a left nondisplaced pelvic fx and left rotator cuff injury a year ago per daughter who lives with pt and is providing history. Pt has hx SVT and briefly showed SVT en route but spontaneously converted with a fib shown on EKG, no prior hx of a fib noted. Progressive intermittent confusion per daughter, not acute. Pt received fentanyl and 4mg  zofran en route and was noted to be hypertensive at 174/102 manual.

## 2020-10-08 NOTE — ED Provider Notes (Signed)
[facility]EMERGENCY DEPT Provider Note   CSN: [id number] Arrival date & time: 10/08/20  1551     History Chief Complaint  Patient presents with   Sheila Andrade    Sheila Andrade is a 85 y.o. female.  Mechanical fall prior to arrival, hit head but no LOC. Pain to left shoulder, left hip, upper back. Not on blood thinners. Hx of parkinsons and uses walker, was reaching for walker and slide out of bed landing on her left side.   The history is provided by the patient.  Fall This is a new problem. The current episode started less than 1 hour ago. The problem has been resolved. Associated symptoms include headaches. Pertinent negatives include no chest pain, no abdominal pain and no shortness of breath. Nothing aggravates the symptoms. Nothing relieves the symptoms. She has tried nothing for the symptoms. The treatment provided no relief.      Past Medical History:  Diagnosis Date   Hot flashes 06/06/2020   Hypertension     Patient Active Problem List   Diagnosis Date Noted   Acute blood loss anemia    Malnutrition of moderate degree 08/19/2020   Shuffling gait    Hypoalbuminemia due to protein-calorie malnutrition (HCC)    Essential hypertension    History of supraventricular tachycardia    Failure to thrive in adult 08/05/2020   Urinary retention 08/05/2020   Osteoporosis 08/05/2020   Debility 08/02/2020   Pressure injury of skin 08/02/2020   Protein-calorie malnutrition, severe 07/28/2020   Dehydration 07/16/2020   Hyponatremia 07/15/2020   Suicidal ideations 07/15/2020   MDD (major depressive disorder), recurrent episode, severe (HCC) 07/15/2020   UTI (urinary tract infection) 07/14/2020   Paroxysmal SVT (supraventricular tachycardia) (HCC) 06/14/2020   Hot flashes 06/06/2020   Fever 05/27/2020   Weight loss 05/27/2020   Tinea corporis 05/27/2020   Anemia 05/27/2020   Hypertension 02/05/2020   Bilateral sensorineural hearing loss 01/16/2020    Benign hypertensive heart disease without congestive heart failure 12/17/2013   Hyperlipidemia 12/17/2013   Primary open angle glaucoma 08/27/2011    History reviewed. No pertinent surgical history.   OB History   No obstetric history on file.     Family History  Problem Relation Age of Onset   Hypertension Other     Social History   Tobacco Use   Smoking status: Never   Smokeless tobacco: Never  Vaping Use   Vaping Use: Never used  Substance Use Topics   Drug use: Never    Home Medications Prior to Admission medications   Medication Sig Start Date End Date Taking? Authorizing Provider  acetaminophen (TYLENOL) 325 MG tablet Take 1-2 tablets (325-650 mg total) by mouth every 4 (four) hours as needed for mild pain. 08/22/20   Angiulli, Mcarthur Rossetti, PA-C  bimatoprost (LUMIGAN) 0.01 % SOLN Place 1 drop into the left eye at bedtime. 06/19/13   [provider]  brinzolamide (AZOPT) 1 % ophthalmic suspension Place 1 drop into the left eye 2 (two) times daily. 01/04/19   [provider]  busPIRone (BUSPAR) 5 MG tablet Take 1 tablet (5 mg total) by mouth 3 (three) times daily. 08/22/20   Angiulli, Mcarthur Rossetti, PA-C  carbidopa-levodopa (SINEMET IR) 10-100 MG tablet Take 2 tablets by mouth 3 (three) times daily. 08/22/20   Angiulli, Mcarthur Rossetti, PA-C  cholecalciferol (VITAMIN D) 25 MCG (1000 UNIT) tablet Take 1 tablet (1,000 Units total) by mouth daily. 08/22/20   Angiulli, Mcarthur Rossetti, PA-C  FLUoxetine (PROZAC)  10 MG capsule Take 3 capsules (30 mg total) by mouth daily. 08/22/20   Angiulli, Mcarthur Rossetti, PA-C  megestrol (MEGACE) 400 MG/10ML suspension Take 20 mLs (800 mg total) by mouth daily. 08/22/20   Angiulli, Mcarthur Rossetti, PA-C  metoprolol tartrate (LOPRESSOR) 25 MG tablet Take 1 tablet (25 mg total) by mouth 2 (two) times daily. 08/22/20   Angiulli, Mcarthur Rossetti, PA-C  Multiple Vitamins-Minerals (MULTIVITAMIN WITH MINERALS) tablet Take 1 tablet by mouth daily.    [provider]   omeprazole (PRILOSEC) 40 MG capsule Take 1 capsule (40 mg total) by mouth daily as needed (heartburn). 08/22/20   Angiulli, Mcarthur Rossetti, PA-C  polyethylene glycol (MIRALAX / GLYCOLAX) 17 g packet Take 17 g by mouth daily as needed for mild constipation.    [provider]  simvastatin (ZOCOR) 20 MG tablet Take 1 tablet (20 mg total) by mouth daily. 08/22/20   Angiulli, Mcarthur Rossetti, PA-C  traZODone (DESYREL) 50 MG tablet Take 0.5 tablets (25 mg total) by mouth at bedtime. 08/22/20   Angiulli, Mcarthur Rossetti, PA-C    Allergies    Patient has no known allergies.  Review of Systems   Review of Systems  Constitutional:  Negative for chills and fever.  HENT:  Negative for ear pain and sore throat.   Eyes:  Negative for pain and visual disturbance.  Respiratory:  Negative for cough and shortness of breath.   Cardiovascular:  Negative for chest pain and palpitations.  Gastrointestinal:  Negative for abdominal pain and vomiting.  Genitourinary:  Negative for dysuria and hematuria.  Musculoskeletal:  Positive for arthralgias and back pain.  Skin:  Negative for color change and rash.  Neurological:  Positive for headaches. Negative for seizures and syncope.  All other systems reviewed and are negative.  Physical Exam Updated Vital Signs BP (!) 153/80   Pulse 67   Temp 97.7 F (36.5 C) (Oral)   Resp 18   Ht 5\' 7"  (1.702 m)   Wt 51.3 kg   SpO2 100%   BMI 17.70 kg/m   Physical Exam Vitals and nursing note reviewed.  Constitutional:      General: She is not in acute distress.    Appearance: She is well-developed.  HENT:     Head:     Comments: Hematoma to left side of head     Nose: Nose normal.     Mouth/Throat:     Mouth: Mucous membranes are moist.  Eyes:     Extraocular Movements: Extraocular movements intact.     Conjunctiva/sclera: Conjunctivae normal.     Pupils: Pupils are equal, round, and reactive to light.  Cardiovascular:     Rate and Rhythm: Normal rate and regular  rhythm.     Heart sounds: No murmur heard. Pulmonary:     Effort: Pulmonary effort is normal. No respiratory distress.     Breath sounds: Normal breath sounds.  Abdominal:     Palpations: Abdomen is soft.     Tenderness: There is no abdominal tenderness.  Musculoskeletal:        General: Tenderness (left shoulder, left hip) present.     Cervical back: Normal range of motion and neck supple. No tenderness.     Comments: No midline spinal pain   Skin:    General: Skin is warm and dry.  Neurological:     General: No focal deficit present.     Mental Status: She is alert and oriented to person, place, and time.  Cranial Nerves: No cranial nerve deficit.     Sensory: No sensory deficit.     Motor: No weakness.     Comments: 5+/5 strength throughout, normal sensation, no drift, normal speech   Psychiatric:        Mood and Affect: Mood normal.    ED Results / Procedures / Treatments   Labs (all labs ordered are listed, but only abnormal results are displayed) Labs Reviewed - No data to display  EKG None  Radiology DG Chest 2 View  Result Date: 10/08/2020 CLINICAL DATA:  Fall EXAM: CHEST - 2 VIEW COMPARISON:  07/14/2020 FINDINGS: Linear scarring at the medial right base. No consolidation or effusion. Normal cardiomediastinal silhouette with aortic atherosclerosis. No pneumothorax. IMPRESSION: No active cardiopulmonary disease.  Scarring at the right base. Electronically Signed   By: Jasmine Pang M.D.   On: 10/08/2020 19:06   DG Thoracic Spine 2 View  Result Date: 10/08/2020 CLINICAL DATA:  Fall EXAM: THORACIC SPINE 2 VIEWS COMPARISON:  MRI 07/22/2020 FINDINGS: Alignment within normal limits. Vertebral body heights are maintained. Multilevel degenerative osteophytes. IMPRESSION: No acute osseous abnormality Electronically Signed   By: Jasmine Pang M.D.   On: 10/08/2020 19:03   DG Lumbar Spine Complete  Result Date: 10/08/2020 CLINICAL DATA:  Fall EXAM: LUMBAR SPINE -  COMPLETE 4+ VIEW COMPARISON:  01/08/2020 FINDINGS: Lumbar alignment within normal limits. Vertebral body heights are maintained. Advanced disc space narrowing and degenerative change at L5-S1. Moderate disc space narrowing at L4-L5. Facet degenerative changes at multiple levels. Aortic atherosclerosis IMPRESSION: 1. No acute osseous abnormality 2. Multilevel degenerative change, most advanced at L5-S1 Electronically Signed   By: Jasmine Pang M.D.   On: 10/08/2020 19:05   DG Pelvis 1-2 Views  Result Date: 10/08/2020 CLINICAL DATA:  Fall. EXAM: LEFT SHOULDER - 2+ VIEW; PELVIS - 1-2 VIEW; LEFT ELBOW - COMPLETE 3+ VIEW COMPARISON:  CT abdomen and pelvis 09/19/2020. FINDINGS: Pelvis: There is no evidence of acute fracture or dislocation. Healed left inferior pubic ramus fracture is unchanged. There are moderate severe degenerative changes of the right hip, unchanged. Soft tissues are unremarkable. Left shoulder: No acute fracture or dislocation. Joint spaces are maintained. There are soft tissue calcifications superior to the humeral head likely related to calcific tendinitis. There are minimal degenerative changes of the acromioclavicular joint. Left elbow: No acute fracture or dislocation. Joint spaces are maintained. Soft tissues are within normal limits. Antecubital IV is present. IMPRESSION: 1. No acute fracture of the pelvis, left shoulder or left elbow. Electronically Signed   By: Darliss Cheney M.D.   On: 10/08/2020 19:05   DG Elbow Complete Left  Result Date: 10/08/2020 CLINICAL DATA:  Fall. EXAM: LEFT SHOULDER - 2+ VIEW; PELVIS - 1-2 VIEW; LEFT ELBOW - COMPLETE 3+ VIEW COMPARISON:  CT abdomen and pelvis 09/19/2020. FINDINGS: Pelvis: There is no evidence of acute fracture or dislocation. Healed left inferior pubic ramus fracture is unchanged. There are moderate severe degenerative changes of the right hip, unchanged. Soft tissues are unremarkable. Left shoulder: No acute fracture or dislocation. Joint  spaces are maintained. There are soft tissue calcifications superior to the humeral head likely related to calcific tendinitis. There are minimal degenerative changes of the acromioclavicular joint. Left elbow: No acute fracture or dislocation. Joint spaces are maintained. Soft tissues are within normal limits. Antecubital IV is present. IMPRESSION: 1. No acute fracture of the pelvis, left shoulder or left elbow. Electronically Signed   By: Darliss Cheney M.D.   On:  10/08/2020 19:05   CT HEAD WO CONTRAST ( )  Result Date: 10/08/2020 CLINICAL DATA:  Fall. EXAM: CT HEAD WITHOUT CONTRAST CT CERVICAL SPINE WITHOUT CONTRAST TECHNIQUE: Multidetector CT imaging of the head and cervical spine was performed following the standard protocol without intravenous contrast. Multiplanar CT image reconstructions of the cervical spine were also generated. COMPARISON:  MRI head 07/14/2020. FINDINGS: CT HEAD FINDINGS Brain: No evidence of acute infarction, hemorrhage, hydrocephalus, extra-axial collection or mass lesion/mass effect. There is mild periventricular white matter hypodensity, likely chronic small vessel ischemic change. Vascular: No hyperdense vessel or unexpected calcification. Skull: Normal. Negative for fracture or focal lesion. Sinuses/Orbits: No acute finding. Other: There is lateral left temporoparietal scalp soft tissue swelling. Partial left mastoid effusion. CT CERVICAL SPINE FINDINGS Alignment: Normal. Skull base and vertebrae: No acute fracture. No primary bone lesion or focal pathologic process. Soft tissues and spinal canal: No prevertebral fluid or swelling. No visible canal hematoma. Disc levels: There is mild disc space narrowing at C5-C6 and C6-C7 compatible with degenerative change. No significant central canal or neural foraminal stenosis at any level. Upper chest: Negative. Other: None. IMPRESSION: No acute intracranial process. No acute fracture or traumatic subluxation of the cervical spine. Left  mastoid effusion. Electronically Signed   By: Darliss Cheney M.D.   On: 10/08/2020 18:14   CT Cervical Spine Wo Contrast  Result Date: 10/08/2020 CLINICAL DATA:  Fall. EXAM: CT HEAD WITHOUT CONTRAST CT CERVICAL SPINE WITHOUT CONTRAST TECHNIQUE: Multidetector CT imaging of the head and cervical spine was performed following the standard protocol without intravenous contrast. Multiplanar CT image reconstructions of the cervical spine were also generated. COMPARISON:  MRI head 07/14/2020. FINDINGS: CT HEAD FINDINGS Brain: No evidence of acute infarction, hemorrhage, hydrocephalus, extra-axial collection or mass lesion/mass effect. There is mild periventricular white matter hypodensity, likely chronic small vessel ischemic change. Vascular: No hyperdense vessel or unexpected calcification. Skull: Normal. Negative for fracture or focal lesion. Sinuses/Orbits: No acute finding. Other: There is lateral left temporoparietal scalp soft tissue swelling. Partial left mastoid effusion. CT CERVICAL SPINE FINDINGS Alignment: Normal. Skull base and vertebrae: No acute fracture. No primary bone lesion or focal pathologic process. Soft tissues and spinal canal: No prevertebral fluid or swelling. No visible canal hematoma. Disc levels: There is mild disc space narrowing at C5-C6 and C6-C7 compatible with degenerative change. No significant central canal or neural foraminal stenosis at any level. Upper chest: Negative. Other: None. IMPRESSION: No acute intracranial process. No acute fracture or traumatic subluxation of the cervical spine. Left mastoid effusion. Electronically Signed   By: Darliss Cheney M.D.   On: 10/08/2020 18:14   DG Shoulder Left  Result Date: 10/08/2020 CLINICAL DATA:  Fall. EXAM: LEFT SHOULDER - 2+ VIEW; PELVIS - 1-2 VIEW; LEFT ELBOW - COMPLETE 3+ VIEW COMPARISON:  CT abdomen and pelvis 09/19/2020. FINDINGS: Pelvis: There is no evidence of acute fracture or dislocation. Healed left inferior pubic ramus  fracture is unchanged. There are moderate severe degenerative changes of the right hip, unchanged. Soft tissues are unremarkable. Left shoulder: No acute fracture or dislocation. Joint spaces are maintained. There are soft tissue calcifications superior to the humeral head likely related to calcific tendinitis. There are minimal degenerative changes of the acromioclavicular joint. Left elbow: No acute fracture or dislocation. Joint spaces are maintained. Soft tissues are within normal limits. Antecubital IV is present. IMPRESSION: 1. No acute fracture of the pelvis, left shoulder or left elbow. Electronically Signed   By: Mcneil Sober.D.  On: 10/08/2020 19:05    Procedures Procedures   Medications Ordered in ED Medications - No data to display  ED Course  I have reviewed the triage vital signs and the nursing notes.  Pertinent labs & imaging results that were available during my care of the patient were reviewed by me and considered in my medical decision making (see chart for details).    MDM Rules/Calculators/A&P                           Divina Neale is here after mechanical fall.  History of Parkinson's, uses a walker.  Having pain to her left shoulder, left hip.  She did hit her head and has a small hematoma over her left side of her head.  Otherwise she appears well.  She is not on blood thinner.  She did not lose consciousness.  Not having any midline spinal pain or neck pain.  Some pain to her upper back.  CT scan of the head and neck overall unremarkable.  Small scalp hematoma.  Chronic left mastoid effusion.  Nonspecific.  Otherwise extremity images unremarkable.  No fractures or malalignment.  Suspect contusions.  Discharged in good condition.  Understands return precautions.  This chart was dictated using voice recognition software.  Despite best efforts to proofread,  errors can occur which can change the documentation meaning.   Final Clinical Impression(s) / ED  Diagnoses Final diagnoses:  Fall, initial encounter    Rx / DC Orders ED Discharge Orders     None        Virgina Norfolk, DO 10/08/20 1915
